# Patient Record
Sex: Female | Born: 1935
Health system: Southern US, Community
[De-identification: ages and names within clinical notes are randomized; demographics above are authoritative.]

## PROBLEM LIST (undated history)

## (undated) DIAGNOSIS — N183 Chronic kidney disease, stage 3 unspecified: Secondary | ICD-10-CM

## (undated) DIAGNOSIS — M1990 Unspecified osteoarthritis, unspecified site: Secondary | ICD-10-CM

## (undated) DIAGNOSIS — I5032 Chronic diastolic (congestive) heart failure: Secondary | ICD-10-CM

## (undated) DIAGNOSIS — G4730 Sleep apnea, unspecified: Secondary | ICD-10-CM

## (undated) DIAGNOSIS — G510 Bell's palsy: Secondary | ICD-10-CM

## (undated) DIAGNOSIS — E119 Type 2 diabetes mellitus without complications: Secondary | ICD-10-CM

## (undated) DIAGNOSIS — Z9889 Other specified postprocedural states: Secondary | ICD-10-CM

## (undated) DIAGNOSIS — I119 Hypertensive heart disease without heart failure: Secondary | ICD-10-CM

## (undated) DIAGNOSIS — F329 Major depressive disorder, single episode, unspecified: Secondary | ICD-10-CM

## (undated) DIAGNOSIS — M069 Rheumatoid arthritis, unspecified: Secondary | ICD-10-CM

## (undated) DIAGNOSIS — F32A Depression, unspecified: Secondary | ICD-10-CM

## (undated) DIAGNOSIS — E785 Hyperlipidemia, unspecified: Secondary | ICD-10-CM

## (undated) DIAGNOSIS — R0600 Dyspnea, unspecified: Secondary | ICD-10-CM

## (undated) HISTORY — PX: JOINT REPLACEMENT: SHX530

## (undated) HISTORY — DX: Chronic kidney disease, stage 3 (moderate): N18.3

## (undated) HISTORY — DX: Hypertensive heart disease without heart failure: I11.9

## (undated) HISTORY — PX: FRACTURE SURGERY: SHX138

## (undated) HISTORY — DX: Chronic kidney disease, stage 3 unspecified: N18.30

## (undated) HISTORY — PX: TOTAL HIP ARTHROPLASTY: SHX124

## (undated) HISTORY — PX: COLONOSCOPY: SHX174

## (undated) HISTORY — PX: CATARACT EXTRACTION W/ INTRAOCULAR LENS  IMPLANT, BILATERAL: SHX1307

## (undated) HISTORY — PX: KNEE ARTHROPLASTY: SHX992

## (undated) HISTORY — DX: Morbid (severe) obesity due to excess calories: E66.01

---

## 1964-04-14 HISTORY — PX: APPENDECTOMY: SHX54

## 1964-04-14 HISTORY — PX: CHOLECYSTECTOMY OPEN: SUR202

## 2013-05-15 HISTORY — PX: CARDIAC CATHETERIZATION: SHX172

## 2014-04-18 DIAGNOSIS — E119 Type 2 diabetes mellitus without complications: Secondary | ICD-10-CM | POA: Diagnosis not present

## 2014-04-18 DIAGNOSIS — I509 Heart failure, unspecified: Secondary | ICD-10-CM | POA: Diagnosis not present

## 2014-04-18 DIAGNOSIS — G4730 Sleep apnea, unspecified: Secondary | ICD-10-CM | POA: Diagnosis not present

## 2014-04-18 DIAGNOSIS — F39 Unspecified mood [affective] disorder: Secondary | ICD-10-CM | POA: Diagnosis not present

## 2014-04-18 DIAGNOSIS — I10 Essential (primary) hypertension: Secondary | ICD-10-CM | POA: Diagnosis not present

## 2014-04-18 DIAGNOSIS — Z0001 Encounter for general adult medical examination with abnormal findings: Secondary | ICD-10-CM | POA: Diagnosis not present

## 2014-04-18 DIAGNOSIS — M549 Dorsalgia, unspecified: Secondary | ICD-10-CM | POA: Diagnosis not present

## 2014-05-15 DIAGNOSIS — I10 Essential (primary) hypertension: Secondary | ICD-10-CM | POA: Diagnosis not present

## 2014-05-15 DIAGNOSIS — I509 Heart failure, unspecified: Secondary | ICD-10-CM | POA: Diagnosis not present

## 2014-05-16 DIAGNOSIS — M5441 Lumbago with sciatica, right side: Secondary | ICD-10-CM | POA: Diagnosis not present

## 2014-05-18 DIAGNOSIS — I10 Essential (primary) hypertension: Secondary | ICD-10-CM | POA: Diagnosis not present

## 2014-05-18 DIAGNOSIS — E780 Pure hypercholesterolemia: Secondary | ICD-10-CM | POA: Diagnosis not present

## 2014-05-18 DIAGNOSIS — E1129 Type 2 diabetes mellitus with other diabetic kidney complication: Secondary | ICD-10-CM | POA: Diagnosis not present

## 2014-05-18 DIAGNOSIS — E1165 Type 2 diabetes mellitus with hyperglycemia: Secondary | ICD-10-CM | POA: Diagnosis not present

## 2014-05-18 DIAGNOSIS — M5441 Lumbago with sciatica, right side: Secondary | ICD-10-CM | POA: Diagnosis not present

## 2014-05-18 DIAGNOSIS — N183 Chronic kidney disease, stage 3 (moderate): Secondary | ICD-10-CM | POA: Diagnosis not present

## 2014-05-18 DIAGNOSIS — Z7983 Long term (current) use of bisphosphonates: Secondary | ICD-10-CM | POA: Diagnosis not present

## 2014-05-18 DIAGNOSIS — E042 Nontoxic multinodular goiter: Secondary | ICD-10-CM | POA: Diagnosis not present

## 2014-05-22 DIAGNOSIS — M5441 Lumbago with sciatica, right side: Secondary | ICD-10-CM | POA: Diagnosis not present

## 2014-05-24 DIAGNOSIS — M5441 Lumbago with sciatica, right side: Secondary | ICD-10-CM | POA: Diagnosis not present

## 2014-05-25 DIAGNOSIS — I10 Essential (primary) hypertension: Secondary | ICD-10-CM | POA: Diagnosis not present

## 2014-05-25 DIAGNOSIS — E1121 Type 2 diabetes mellitus with diabetic nephropathy: Secondary | ICD-10-CM | POA: Diagnosis not present

## 2014-05-25 DIAGNOSIS — Z6841 Body Mass Index (BMI) 40.0 and over, adult: Secondary | ICD-10-CM | POA: Diagnosis not present

## 2014-05-25 DIAGNOSIS — F39 Unspecified mood [affective] disorder: Secondary | ICD-10-CM | POA: Diagnosis not present

## 2014-05-25 DIAGNOSIS — E1122 Type 2 diabetes mellitus with diabetic chronic kidney disease: Secondary | ICD-10-CM | POA: Diagnosis not present

## 2014-05-25 DIAGNOSIS — G4730 Sleep apnea, unspecified: Secondary | ICD-10-CM | POA: Diagnosis not present

## 2014-05-26 DIAGNOSIS — M5441 Lumbago with sciatica, right side: Secondary | ICD-10-CM | POA: Diagnosis not present

## 2014-05-29 DIAGNOSIS — M5441 Lumbago with sciatica, right side: Secondary | ICD-10-CM | POA: Diagnosis not present

## 2014-06-12 DIAGNOSIS — M5441 Lumbago with sciatica, right side: Secondary | ICD-10-CM | POA: Diagnosis not present

## 2014-06-14 DIAGNOSIS — M5441 Lumbago with sciatica, right side: Secondary | ICD-10-CM | POA: Diagnosis not present

## 2014-06-16 DIAGNOSIS — Z6841 Body Mass Index (BMI) 40.0 and over, adult: Secondary | ICD-10-CM | POA: Diagnosis not present

## 2014-06-16 DIAGNOSIS — I10 Essential (primary) hypertension: Secondary | ICD-10-CM | POA: Diagnosis not present

## 2014-06-16 DIAGNOSIS — E119 Type 2 diabetes mellitus without complications: Secondary | ICD-10-CM | POA: Diagnosis not present

## 2014-06-16 DIAGNOSIS — F39 Unspecified mood [affective] disorder: Secondary | ICD-10-CM | POA: Diagnosis not present

## 2014-06-16 DIAGNOSIS — G4730 Sleep apnea, unspecified: Secondary | ICD-10-CM | POA: Diagnosis not present

## 2014-06-19 DIAGNOSIS — E780 Pure hypercholesterolemia: Secondary | ICD-10-CM | POA: Diagnosis not present

## 2014-06-19 DIAGNOSIS — I509 Heart failure, unspecified: Secondary | ICD-10-CM | POA: Diagnosis not present

## 2014-06-19 DIAGNOSIS — I10 Essential (primary) hypertension: Secondary | ICD-10-CM | POA: Diagnosis not present

## 2014-07-04 DIAGNOSIS — G4730 Sleep apnea, unspecified: Secondary | ICD-10-CM | POA: Diagnosis not present

## 2014-07-04 DIAGNOSIS — Z6841 Body Mass Index (BMI) 40.0 and over, adult: Secondary | ICD-10-CM | POA: Diagnosis not present

## 2014-07-04 DIAGNOSIS — N183 Chronic kidney disease, stage 3 (moderate): Secondary | ICD-10-CM | POA: Diagnosis not present

## 2014-07-04 DIAGNOSIS — E1122 Type 2 diabetes mellitus with diabetic chronic kidney disease: Secondary | ICD-10-CM | POA: Diagnosis not present

## 2014-07-04 DIAGNOSIS — I10 Essential (primary) hypertension: Secondary | ICD-10-CM | POA: Diagnosis not present

## 2014-07-04 DIAGNOSIS — E1121 Type 2 diabetes mellitus with diabetic nephropathy: Secondary | ICD-10-CM | POA: Diagnosis not present

## 2014-07-04 DIAGNOSIS — F39 Unspecified mood [affective] disorder: Secondary | ICD-10-CM | POA: Diagnosis not present

## 2014-07-21 DIAGNOSIS — S63501A Unspecified sprain of right wrist, initial encounter: Secondary | ICD-10-CM | POA: Diagnosis not present

## 2014-07-21 DIAGNOSIS — S52501A Unspecified fracture of the lower end of right radius, initial encounter for closed fracture: Secondary | ICD-10-CM | POA: Diagnosis not present

## 2014-07-21 DIAGNOSIS — S4991XA Unspecified injury of right shoulder and upper arm, initial encounter: Secondary | ICD-10-CM | POA: Diagnosis not present

## 2014-07-21 DIAGNOSIS — E118 Type 2 diabetes mellitus with unspecified complications: Secondary | ICD-10-CM | POA: Diagnosis not present

## 2014-07-21 DIAGNOSIS — S52601A Unspecified fracture of lower end of right ulna, initial encounter for closed fracture: Secondary | ICD-10-CM | POA: Diagnosis not present

## 2014-07-25 DIAGNOSIS — I10 Essential (primary) hypertension: Secondary | ICD-10-CM | POA: Diagnosis not present

## 2014-07-25 DIAGNOSIS — E1122 Type 2 diabetes mellitus with diabetic chronic kidney disease: Secondary | ICD-10-CM | POA: Diagnosis not present

## 2014-07-25 DIAGNOSIS — S52501A Unspecified fracture of the lower end of right radius, initial encounter for closed fracture: Secondary | ICD-10-CM | POA: Diagnosis not present

## 2014-07-25 DIAGNOSIS — Z6841 Body Mass Index (BMI) 40.0 and over, adult: Secondary | ICD-10-CM | POA: Diagnosis not present

## 2014-07-25 DIAGNOSIS — F39 Unspecified mood [affective] disorder: Secondary | ICD-10-CM | POA: Diagnosis not present

## 2014-07-25 DIAGNOSIS — E1121 Type 2 diabetes mellitus with diabetic nephropathy: Secondary | ICD-10-CM | POA: Diagnosis not present

## 2014-07-25 DIAGNOSIS — R269 Unspecified abnormalities of gait and mobility: Secondary | ICD-10-CM | POA: Diagnosis not present

## 2014-07-27 DIAGNOSIS — E119 Type 2 diabetes mellitus without complications: Secondary | ICD-10-CM | POA: Diagnosis not present

## 2014-07-27 DIAGNOSIS — F329 Major depressive disorder, single episode, unspecified: Secondary | ICD-10-CM | POA: Diagnosis not present

## 2014-07-27 DIAGNOSIS — Z9181 History of falling: Secondary | ICD-10-CM | POA: Diagnosis not present

## 2014-07-27 DIAGNOSIS — S52571A Other intraarticular fracture of lower end of right radius, initial encounter for closed fracture: Secondary | ICD-10-CM | POA: Diagnosis not present

## 2014-07-27 DIAGNOSIS — Z6841 Body Mass Index (BMI) 40.0 and over, adult: Secondary | ICD-10-CM | POA: Diagnosis not present

## 2014-07-27 DIAGNOSIS — N183 Chronic kidney disease, stage 3 (moderate): Secondary | ICD-10-CM | POA: Diagnosis not present

## 2014-07-27 DIAGNOSIS — Z7982 Long term (current) use of aspirin: Secondary | ICD-10-CM | POA: Diagnosis not present

## 2014-07-27 DIAGNOSIS — S52501D Unspecified fracture of the lower end of right radius, subsequent encounter for closed fracture with routine healing: Secondary | ICD-10-CM | POA: Diagnosis not present

## 2014-07-27 DIAGNOSIS — I129 Hypertensive chronic kidney disease with stage 1 through stage 4 chronic kidney disease, or unspecified chronic kidney disease: Secondary | ICD-10-CM | POA: Diagnosis not present

## 2014-07-31 DIAGNOSIS — E119 Type 2 diabetes mellitus without complications: Secondary | ICD-10-CM | POA: Diagnosis not present

## 2014-07-31 DIAGNOSIS — F329 Major depressive disorder, single episode, unspecified: Secondary | ICD-10-CM | POA: Diagnosis not present

## 2014-07-31 DIAGNOSIS — I129 Hypertensive chronic kidney disease with stage 1 through stage 4 chronic kidney disease, or unspecified chronic kidney disease: Secondary | ICD-10-CM | POA: Diagnosis not present

## 2014-07-31 DIAGNOSIS — Z7982 Long term (current) use of aspirin: Secondary | ICD-10-CM | POA: Diagnosis not present

## 2014-07-31 DIAGNOSIS — S52501D Unspecified fracture of the lower end of right radius, subsequent encounter for closed fracture with routine healing: Secondary | ICD-10-CM | POA: Diagnosis not present

## 2014-07-31 DIAGNOSIS — N183 Chronic kidney disease, stage 3 (moderate): Secondary | ICD-10-CM | POA: Diagnosis not present

## 2014-08-01 DIAGNOSIS — E1121 Type 2 diabetes mellitus with diabetic nephropathy: Secondary | ICD-10-CM | POA: Diagnosis not present

## 2014-08-01 DIAGNOSIS — S52501A Unspecified fracture of the lower end of right radius, initial encounter for closed fracture: Secondary | ICD-10-CM | POA: Diagnosis not present

## 2014-08-01 DIAGNOSIS — E782 Mixed hyperlipidemia: Secondary | ICD-10-CM | POA: Diagnosis not present

## 2014-08-01 DIAGNOSIS — E1122 Type 2 diabetes mellitus with diabetic chronic kidney disease: Secondary | ICD-10-CM | POA: Diagnosis not present

## 2014-08-01 DIAGNOSIS — I10 Essential (primary) hypertension: Secondary | ICD-10-CM | POA: Diagnosis not present

## 2014-08-01 DIAGNOSIS — Z6839 Body mass index (BMI) 39.0-39.9, adult: Secondary | ICD-10-CM | POA: Diagnosis not present

## 2014-08-02 DIAGNOSIS — S52501D Unspecified fracture of the lower end of right radius, subsequent encounter for closed fracture with routine healing: Secondary | ICD-10-CM | POA: Diagnosis not present

## 2014-08-02 DIAGNOSIS — Z7982 Long term (current) use of aspirin: Secondary | ICD-10-CM | POA: Diagnosis not present

## 2014-08-02 DIAGNOSIS — F329 Major depressive disorder, single episode, unspecified: Secondary | ICD-10-CM | POA: Diagnosis not present

## 2014-08-02 DIAGNOSIS — S50311A Abrasion of right elbow, initial encounter: Secondary | ICD-10-CM | POA: Diagnosis not present

## 2014-08-02 DIAGNOSIS — I129 Hypertensive chronic kidney disease with stage 1 through stage 4 chronic kidney disease, or unspecified chronic kidney disease: Secondary | ICD-10-CM | POA: Diagnosis not present

## 2014-08-02 DIAGNOSIS — W19XXXA Unspecified fall, initial encounter: Secondary | ICD-10-CM | POA: Diagnosis not present

## 2014-08-02 DIAGNOSIS — Z794 Long term (current) use of insulin: Secondary | ICD-10-CM | POA: Diagnosis not present

## 2014-08-02 DIAGNOSIS — N183 Chronic kidney disease, stage 3 (moderate): Secondary | ICD-10-CM | POA: Diagnosis not present

## 2014-08-02 DIAGNOSIS — I10 Essential (primary) hypertension: Secondary | ICD-10-CM | POA: Diagnosis not present

## 2014-08-02 DIAGNOSIS — E119 Type 2 diabetes mellitus without complications: Secondary | ICD-10-CM | POA: Diagnosis not present

## 2014-08-07 ENCOUNTER — Encounter (HOSPITAL_COMMUNITY): Payer: Self-pay

## 2014-08-07 ENCOUNTER — Encounter (HOSPITAL_COMMUNITY)
Admission: RE | Admit: 2014-08-07 | Discharge: 2014-08-07 | Disposition: A | Discharge status: Home / Self Care | Payer: Medicare Other | Source: Ambulatory Visit | Attending: Orthopedic Surgery | Admitting: Orthopedic Surgery

## 2014-08-07 DIAGNOSIS — E119 Type 2 diabetes mellitus without complications: Secondary | ICD-10-CM | POA: Diagnosis not present

## 2014-08-07 DIAGNOSIS — I252 Old myocardial infarction: Secondary | ICD-10-CM | POA: Diagnosis not present

## 2014-08-07 DIAGNOSIS — E785 Hyperlipidemia, unspecified: Secondary | ICD-10-CM | POA: Diagnosis not present

## 2014-08-07 DIAGNOSIS — S6411XA Injury of median nerve at wrist and hand level of right arm, initial encounter: Secondary | ICD-10-CM | POA: Diagnosis not present

## 2014-08-07 DIAGNOSIS — R531 Weakness: Secondary | ICD-10-CM | POA: Diagnosis not present

## 2014-08-07 DIAGNOSIS — Z96643 Presence of artificial hip joint, bilateral: Secondary | ICD-10-CM | POA: Diagnosis not present

## 2014-08-07 DIAGNOSIS — W19XXXA Unspecified fall, initial encounter: Secondary | ICD-10-CM | POA: Diagnosis not present

## 2014-08-07 DIAGNOSIS — Z794 Long term (current) use of insulin: Secondary | ICD-10-CM | POA: Diagnosis not present

## 2014-08-07 DIAGNOSIS — S52571A Other intraarticular fracture of lower end of right radius, initial encounter for closed fracture: Secondary | ICD-10-CM | POA: Diagnosis not present

## 2014-08-07 DIAGNOSIS — I509 Heart failure, unspecified: Secondary | ICD-10-CM | POA: Diagnosis not present

## 2014-08-07 DIAGNOSIS — S62101A Fracture of unspecified carpal bone, right wrist, initial encounter for closed fracture: Secondary | ICD-10-CM | POA: Diagnosis not present

## 2014-08-07 DIAGNOSIS — R609 Edema, unspecified: Secondary | ICD-10-CM | POA: Diagnosis not present

## 2014-08-07 DIAGNOSIS — I10 Essential (primary) hypertension: Secondary | ICD-10-CM | POA: Diagnosis not present

## 2014-08-07 DIAGNOSIS — E780 Pure hypercholesterolemia: Secondary | ICD-10-CM | POA: Diagnosis not present

## 2014-08-07 HISTORY — DX: Sleep apnea, unspecified: G47.30

## 2014-08-07 HISTORY — DX: Major depressive disorder, single episode, unspecified: F32.9

## 2014-08-07 HISTORY — DX: Depression, unspecified: F32.A

## 2014-08-07 HISTORY — DX: Hyperlipidemia, unspecified: E78.5

## 2014-08-07 HISTORY — DX: Bell's palsy: G51.0

## 2014-08-07 LAB — BASIC METABOLIC PANEL
ANION GAP: 10 (ref 5–15)
BUN: 23 mg/dL (ref 6–23)
CALCIUM: 9.4 mg/dL (ref 8.4–10.5)
CO2: 24 mmol/L (ref 19–32)
Chloride: 106 mmol/L (ref 96–112)
Creatinine, Ser: 1.08 mg/dL (ref 0.50–1.10)
GFR, EST AFRICAN AMERICAN: 54 mL/min — AB (ref >90)
GFR, EST NON AFRICAN AMERICAN: 47 mL/min — AB (ref >90)
Glucose, Bld: 270 mg/dL — ABNORMAL HIGH (ref 70–99)
Potassium: 4.4 mmol/L (ref 3.5–5.1)
SODIUM: 140 mmol/L (ref 135–145)

## 2014-08-07 LAB — CBC
HCT: 41.2 % (ref 36.0–46.0)
Hemoglobin: 12.8 g/dL (ref 12.0–15.0)
MCH: 30.4 pg (ref 26.0–34.0)
MCHC: 31.1 g/dL (ref 30.0–36.0)
MCV: 97.9 fL (ref 78.0–100.0)
Platelets: 184 10*3/uL (ref 150–400)
RBC: 4.21 MIL/uL (ref 3.87–5.11)
RDW: 14.5 % (ref 11.5–15.5)
WBC: 7.6 10*3/uL (ref 4.0–10.5)

## 2014-08-07 NOTE — Pre-Procedure Instructions (Signed)
[name]  08/07/2014   Your procedure is scheduled on:  Tuesday, April 26.  Report to [facility] Admitting at 3:00 PM.  Call this number if you have problems the morning of surgery: [phone]   Remember:   Do not eat food or drink liquids after midnight.   Take these medicines the morning of surgery with A SIP OF WATER: amLODipine (NORVASC).                     Make take Tylenol if needed.              May take Tramadol if needed.   Do not wear jewelry, make-up or nail polish.  Do not wear lotions, powders, or perfumes.   Do not shave 48 hours prior to surgery.   Do not bring valuables to the hospital.              [facility] is not responsible for any belongings or valuables.               Contacts, dentures or bridgework may not be worn into surgery.  Leave suitcase in the car. After surgery it may be brought to your room.  For patients admitted to the hospital, discharge time is determined by your  treatment team.               Patients discharged the day of surgery will not be allowed to drive home.  Name and phone number of your driver: -   Special Instructions: Review  [facility] - Preparing For Surgery.   Please read over the following fact sheets that you were given: Pain Booklet, Coughing and Deep Breathing and Surgical Site Infection Prevention

## 2014-08-07 NOTE — Progress Notes (Signed)
Brandi Melton is from [facility], she is currently staying with her daughter in [place].    PCP is Dr Orland Dec- [facility] in [place]- I requested office notes, x-rays, labs, sleep study, Medication list.  Patient has sleep apnea but does not use CPAP.  Endocrinologist is Dr Dr Pennelope Bracken, [place].  Patient reported last hgbAIC was in the 7's.  This am fasting CBG was 170, which patient reported is a good reading for her.I requested reports and office notes from office.  Cardiologist is Dr Latricia Heft in [place], [place].  Patient reported that she has had an EKG in the past year, cardiac cath in the past 3 years- "was okay" I requested reports and office notes from office.

## 2014-08-08 ENCOUNTER — Inpatient Hospital Stay (HOSPITAL_COMMUNITY): Payer: Medicare Other | Admitting: Anesthesiology

## 2014-08-08 ENCOUNTER — Encounter (HOSPITAL_COMMUNITY): Payer: Self-pay | Admitting: *Deleted

## 2014-08-08 ENCOUNTER — Encounter (HOSPITAL_COMMUNITY)
Admission: RE | Disposition: A | Discharge status: Skilled Nursing Facility | Payer: Self-pay | Source: Ambulatory Visit | Attending: Orthopedic Surgery

## 2014-08-08 ENCOUNTER — Observation Stay (HOSPITAL_COMMUNITY)
Admission: RE | Admit: 2014-08-08 | Discharge: 2014-08-10 | Disposition: A | Discharge status: Skilled Nursing Facility | Payer: Medicare Other | Source: Ambulatory Visit | Attending: Orthopedic Surgery | Admitting: Orthopedic Surgery

## 2014-08-08 DIAGNOSIS — E780 Pure hypercholesterolemia: Secondary | ICD-10-CM | POA: Insufficient documentation

## 2014-08-08 DIAGNOSIS — G8918 Other acute postprocedural pain: Secondary | ICD-10-CM | POA: Diagnosis not present

## 2014-08-08 DIAGNOSIS — S6411XA Injury of median nerve at wrist and hand level of right arm, initial encounter: Secondary | ICD-10-CM | POA: Insufficient documentation

## 2014-08-08 DIAGNOSIS — Z794 Long term (current) use of insulin: Secondary | ICD-10-CM | POA: Diagnosis not present

## 2014-08-08 DIAGNOSIS — I509 Heart failure, unspecified: Secondary | ICD-10-CM | POA: Diagnosis not present

## 2014-08-08 DIAGNOSIS — W19XXXA Unspecified fall, initial encounter: Secondary | ICD-10-CM | POA: Insufficient documentation

## 2014-08-08 DIAGNOSIS — I252 Old myocardial infarction: Secondary | ICD-10-CM | POA: Insufficient documentation

## 2014-08-08 DIAGNOSIS — Z96643 Presence of artificial hip joint, bilateral: Secondary | ICD-10-CM | POA: Insufficient documentation

## 2014-08-08 DIAGNOSIS — E119 Type 2 diabetes mellitus without complications: Secondary | ICD-10-CM | POA: Insufficient documentation

## 2014-08-08 DIAGNOSIS — I10 Essential (primary) hypertension: Secondary | ICD-10-CM | POA: Diagnosis not present

## 2014-08-08 DIAGNOSIS — G5601 Carpal tunnel syndrome, right upper limb: Secondary | ICD-10-CM | POA: Diagnosis not present

## 2014-08-08 DIAGNOSIS — S52571A Other intraarticular fracture of lower end of right radius, initial encounter for closed fracture: Principal | ICD-10-CM | POA: Insufficient documentation

## 2014-08-08 DIAGNOSIS — S52509A Unspecified fracture of the lower end of unspecified radius, initial encounter for closed fracture: Secondary | ICD-10-CM | POA: Diagnosis present

## 2014-08-08 DIAGNOSIS — R531 Weakness: Secondary | ICD-10-CM | POA: Insufficient documentation

## 2014-08-08 DIAGNOSIS — S52501P Unspecified fracture of the lower end of right radius, subsequent encounter for closed fracture with malunion: Secondary | ICD-10-CM | POA: Diagnosis not present

## 2014-08-08 DIAGNOSIS — E785 Hyperlipidemia, unspecified: Secondary | ICD-10-CM | POA: Insufficient documentation

## 2014-08-08 DIAGNOSIS — R609 Edema, unspecified: Secondary | ICD-10-CM | POA: Insufficient documentation

## 2014-08-08 HISTORY — PX: CARPAL TUNNEL RELEASE: SHX101

## 2014-08-08 HISTORY — DX: Rheumatoid arthritis, unspecified: M06.9

## 2014-08-08 HISTORY — PX: ORIF WRIST FRACTURE: SHX2133

## 2014-08-08 LAB — GLUCOSE, CAPILLARY
GLUCOSE-CAPILLARY: 147 mg/dL — AB (ref 70–99)
GLUCOSE-CAPILLARY: 119 mg/dL — AB (ref 70–99)

## 2014-08-08 SURGERY — OPEN REDUCTION INTERNAL FIXATION (ORIF) WRIST FRACTURE
Anesthesia: General | Site: Wrist | Laterality: Right

## 2014-08-08 MED ORDER — FENTANYL CITRATE (PF) 100 MCG/2ML IJ SOLN
INTRAMUSCULAR | Status: AC
  Administered 2014-08-08: 25 ug via INTRAVENOUS
  Filled 2014-08-08: qty 2

## 2014-08-08 MED ORDER — ONDANSETRON HCL 4 MG/2ML IJ SOLN: 4.0000 mg | Freq: Four times a day (QID) | INTRAMUSCULAR | Status: DC | PRN

## 2014-08-08 MED ORDER — ONDANSETRON HCL 4 MG/2ML IJ SOLN
INTRAMUSCULAR | Status: DC | PRN
  Administered 2014-08-08: 4 mg via INTRAVENOUS

## 2014-08-08 MED ORDER — MAGNESIUM CITRATE PO SOLN
1.0000 | Freq: Once | ORAL | Status: AC | PRN
  Filled 2014-08-08: qty 296

## 2014-08-08 MED ORDER — 0.9 % SODIUM CHLORIDE (POUR BTL) OPTIME
TOPICAL | Status: DC | PRN
  Administered 2014-08-08 (×2): 1000 mL

## 2014-08-08 MED ORDER — CEFAZOLIN SODIUM 1-5 GM-% IV SOLN
1.0000 g | INTRAVENOUS | Status: AC
  Administered 2014-08-08: 1 g via INTRAVENOUS
  Filled 2014-08-08: qty 50

## 2014-08-08 MED ORDER — MORPHINE SULFATE 2 MG/ML IJ SOLN
1.0000 mg | INTRAMUSCULAR | Status: DC | PRN
  Administered 2014-08-09: 1 mg via INTRAVENOUS
  Filled 2014-08-08: qty 1

## 2014-08-08 MED ORDER — FENTANYL CITRATE (PF) 100 MCG/2ML IJ SOLN
INTRAMUSCULAR | Status: DC | PRN
  Administered 2014-08-08 (×5): 25 ug via INTRAVENOUS
  Administered 2014-08-08: 50 ug via INTRAVENOUS
  Administered 2014-08-08: 25 ug via INTRAVENOUS

## 2014-08-08 MED ORDER — PROPOFOL 10 MG/ML IV BOLUS: INTRAVENOUS | Status: DC | PRN

## 2014-08-08 MED ORDER — AMLODIPINE BESYLATE 2.5 MG PO TABS
2.5000 mg | ORAL_TABLET | Freq: Every day | ORAL | Status: DC
  Administered 2014-08-09 – 2014-08-10 (×2): 2.5 mg via ORAL
  Filled 2014-08-08 (×2): qty 1

## 2014-08-08 MED ORDER — ROPIVACAINE HCL 5 MG/ML IJ SOLN
INTRAMUSCULAR | Status: DC | PRN
  Administered 2014-08-08: 25 mL via PERINEURAL

## 2014-08-08 MED ORDER — OXYCODONE HCL 5 MG PO TABS
5.0000 mg | ORAL_TABLET | ORAL | Status: DC | PRN
  Administered 2014-08-09 – 2014-08-10 (×5): 10 mg via ORAL
  Filled 2014-08-08 (×5): qty 2

## 2014-08-08 MED ORDER — MIDAZOLAM HCL 2 MG/2ML IJ SOLN
INTRAMUSCULAR | Status: AC
  Filled 2014-08-08: qty 2

## 2014-08-08 MED ORDER — FOLIC ACID 0.5 MG HALF TAB
500.0000 ug | ORAL_TABLET | Freq: Every day | ORAL | Status: DC
  Administered 2014-08-09 – 2014-08-10 (×2): 0.5 mg via ORAL
  Filled 2014-08-08 (×2): qty 1

## 2014-08-08 MED ORDER — IRBESARTAN 150 MG PO TABS
150.0000 mg | ORAL_TABLET | Freq: Every day | ORAL | Status: DC
  Administered 2014-08-09 – 2014-08-10 (×2): 150 mg via ORAL
  Filled 2014-08-08 (×2): qty 1

## 2014-08-08 MED ORDER — INSULIN ASPART 100 UNIT/ML ~~LOC~~ SOLN
0.0000 [IU] | Freq: Three times a day (TID) | SUBCUTANEOUS | Status: DC
  Administered 2014-08-09: 8 [IU] via SUBCUTANEOUS
  Administered 2014-08-09: 5 [IU] via SUBCUTANEOUS
  Administered 2014-08-09: 3 [IU] via SUBCUTANEOUS
  Administered 2014-08-10: 11 [IU] via SUBCUTANEOUS
  Administered 2014-08-10: 5 [IU] via SUBCUTANEOUS

## 2014-08-08 MED ORDER — POTASSIUM CHLORIDE CRYS ER 20 MEQ PO TBCR
20.0000 meq | EXTENDED_RELEASE_TABLET | Freq: Every day | ORAL | Status: DC
  Administered 2014-08-09 – 2014-08-10 (×2): 20 meq via ORAL
  Filled 2014-08-08 (×2): qty 1

## 2014-08-08 MED ORDER — PROPOFOL 10 MG/ML IV BOLUS
INTRAVENOUS | Status: AC
  Filled 2014-08-08: qty 20

## 2014-08-08 MED ORDER — CEFAZOLIN SODIUM-DEXTROSE 2-3 GM-% IV SOLR
INTRAVENOUS | Status: AC
  Filled 2014-08-08: qty 50

## 2014-08-08 MED ORDER — FENTANYL CITRATE (PF) 250 MCG/5ML IJ SOLN
INTRAMUSCULAR | Status: AC
  Filled 2014-08-08: qty 5

## 2014-08-08 MED ORDER — SENNA 8.6 MG PO TABS
1.0000 | ORAL_TABLET | Freq: Two times a day (BID) | ORAL | Status: DC
  Administered 2014-08-08 – 2014-08-10 (×4): 8.6 mg via ORAL
  Filled 2014-08-08 (×4): qty 1

## 2014-08-08 MED ORDER — CARVEDILOL PHOSPHATE ER 80 MG PO CP24
80.0000 mg | ORAL_CAPSULE | Freq: Every day | ORAL | Status: DC
  Administered 2014-08-09 – 2014-08-10 (×2): 80 mg via ORAL
  Filled 2014-08-08 (×2): qty 1

## 2014-08-08 MED ORDER — CEFAZOLIN SODIUM-DEXTROSE 2-3 GM-% IV SOLR
2.0000 g | Freq: Once | INTRAVENOUS | Status: AC
  Administered 2014-08-08: 2 g via INTRAVENOUS

## 2014-08-08 MED ORDER — FENTANYL CITRATE (PF) 100 MCG/2ML IJ SOLN
INTRAMUSCULAR | Status: AC
Start: 2014-08-08 — End: 2014-08-09
  Filled 2014-08-08: qty 2

## 2014-08-08 MED ORDER — KETOROLAC TROMETHAMINE 30 MG/ML IJ SOLN
30.0000 mg | Freq: Once | INTRAMUSCULAR | Status: AC | PRN
  Administered 2014-08-08: 30 mg via INTRAVENOUS

## 2014-08-08 MED ORDER — EZETIMIBE-SIMVASTATIN 10-40 MG PO TABS
1.0000 | ORAL_TABLET | Freq: Every day | ORAL | Status: DC
  Administered 2014-08-08: 1 via ORAL
  Filled 2014-08-08 (×2): qty 1

## 2014-08-08 MED ORDER — POTASSIUM GLUCONATE 595 (99 K) MG PO TABS: 595.0000 mg | ORAL_TABLET | Freq: Every day | ORAL | Status: DC

## 2014-08-08 MED ORDER — ONDANSETRON HCL 4 MG PO TABS: 4.0000 mg | ORAL_TABLET | Freq: Four times a day (QID) | ORAL | Status: DC | PRN

## 2014-08-08 MED ORDER — KETOROLAC TROMETHAMINE 30 MG/ML IJ SOLN
INTRAMUSCULAR | Status: AC
  Filled 2014-08-08: qty 1

## 2014-08-08 MED ORDER — NIACIN ER (ANTIHYPERLIPIDEMIC) 500 MG PO TBCR
500.0000 mg | EXTENDED_RELEASE_TABLET | Freq: Two times a day (BID) | ORAL | Status: DC
  Administered 2014-08-08 – 2014-08-10 (×4): 500 mg via ORAL
  Filled 2014-08-08 (×5): qty 1

## 2014-08-08 MED ORDER — VITAMIN D (ERGOCALCIFEROL) 1.25 MG (50000 UNIT) PO CAPS: 50000.0000 [IU] | ORAL_CAPSULE | ORAL | Status: DC

## 2014-08-08 MED ORDER — CEFAZOLIN SODIUM 1-5 GM-% IV SOLN
1.0000 g | Freq: Three times a day (TID) | INTRAVENOUS | Status: AC
Start: 2014-08-09 — End: 2014-08-09
  Administered 2014-08-09 (×2): 1 g via INTRAVENOUS
  Filled 2014-08-08 (×3): qty 50

## 2014-08-08 MED ORDER — FENTANYL CITRATE (PF) 100 MCG/2ML IJ SOLN
25.0000 ug | INTRAMUSCULAR | Status: DC | PRN
  Administered 2014-08-08: 25 ug via INTRAVENOUS

## 2014-08-08 MED ORDER — PROMETHAZINE HCL 25 MG/ML IJ SOLN: 6.2500 mg | INTRAMUSCULAR | Status: DC | PRN

## 2014-08-08 MED ORDER — FUROSEMIDE 40 MG PO TABS
40.0000 mg | ORAL_TABLET | Freq: Every day | ORAL | Status: DC
  Administered 2014-08-09 – 2014-08-10 (×2): 40 mg via ORAL
  Filled 2014-08-08 (×2): qty 1

## 2014-08-08 MED ORDER — LIDOCAINE HCL (CARDIAC) 20 MG/ML IV SOLN
INTRAVENOUS | Status: DC | PRN
  Administered 2014-08-08: 60 mg via INTRAVENOUS

## 2014-08-08 MED ORDER — LACTATED RINGERS IV SOLN
INTRAVENOUS | Status: DC
  Administered 2014-08-08 (×3): via INTRAVENOUS

## 2014-08-08 MED ORDER — MAGNESIUM OXIDE 400 (241.3 MG) MG PO TABS
400.0000 mg | ORAL_TABLET | Freq: Every day | ORAL | Status: DC
  Administered 2014-08-09 – 2014-08-10 (×2): 400 mg via ORAL
  Filled 2014-08-08 (×2): qty 1

## 2014-08-08 MED ORDER — PROPOFOL 10 MG/ML IV BOLUS
INTRAVENOUS | Status: DC | PRN
  Administered 2014-08-08: 150 mg via INTRAVENOUS

## 2014-08-08 MED ORDER — VITAMIN D 1000 UNITS PO TABS
3000.0000 [IU] | ORAL_TABLET | Freq: Every day | ORAL | Status: DC
  Administered 2014-08-09 – 2014-08-10 (×2): 3000 [IU] via ORAL
  Filled 2014-08-08 (×2): qty 3

## 2014-08-08 MED ORDER — CEFAZOLIN SODIUM 1-5 GM-% IV SOLN
INTRAVENOUS | Status: AC
  Filled 2014-08-08: qty 50

## 2014-08-08 MED ORDER — LIDOCAINE HCL (CARDIAC) 20 MG/ML IV SOLN: INTRAVENOUS | Status: DC | PRN

## 2014-08-08 MED ORDER — MAGNESIUM OXIDE 400 MG PO TABS: 400.0000 mg | ORAL_TABLET | Freq: Every day | ORAL | Status: DC

## 2014-08-08 MED ORDER — LACTATED RINGERS IV SOLN
INTRAVENOUS | Status: DC
  Administered 2014-08-08 – 2014-08-09 (×2): via INTRAVENOUS

## 2014-08-08 SURGICAL SUPPLY — 82 items
BANDAGE ELASTIC 3 VELCRO ST LF (GAUZE/BANDAGES/DRESSINGS) ×2 IMPLANT
BANDAGE ELASTIC 4 VELCRO ST LF (GAUZE/BANDAGES/DRESSINGS) ×2 IMPLANT
BIT DRILL 2.2 SS TIBIAL (BIT) ×2 IMPLANT
BLADE SURG 15 STRL LF DISP TIS (BLADE) ×2 IMPLANT
BLADE SURG 15 STRL SS (BLADE) ×2
BLADE SURG ROTATE 9660 (MISCELLANEOUS) IMPLANT
BNDG CONFORM 2 STRL LF (GAUZE/BANDAGES/DRESSINGS) ×2 IMPLANT
BNDG CONFORM 3 STRL LF (GAUZE/BANDAGES/DRESSINGS) ×2 IMPLANT
BNDG ESMARK 4X9 LF (GAUZE/BANDAGES/DRESSINGS) ×2 IMPLANT
BNDG GAUZE ELAST 4 BULKY (GAUZE/BANDAGES/DRESSINGS) ×2 IMPLANT
CORDS BIPOLAR (ELECTRODE) ×2 IMPLANT
COVER SURGICAL LIGHT HANDLE (MISCELLANEOUS) ×2 IMPLANT
CUFF TOURNIQUET SINGLE 18IN (TOURNIQUET CUFF) IMPLANT
CUFF TOURNIQUET SINGLE 24IN (TOURNIQUET CUFF) ×2 IMPLANT
DRAIN TLS ROUND 10FR (DRAIN) IMPLANT
DRAPE OEC MINIVIEW 54X84 (DRAPES) ×2 IMPLANT
DRAPE SURG 17X23 STRL (DRAPES) ×2 IMPLANT
DRSG ADAPTIC 3X8 NADH LF (GAUZE/BANDAGES/DRESSINGS) ×2 IMPLANT
EVACUATOR 1/8 PVC DRAIN (DRAIN) IMPLANT
GAUZE SPONGE 4X4 12PLY STRL (GAUZE/BANDAGES/DRESSINGS) ×2 IMPLANT
GAUZE XEROFORM 1X8 LF (GAUZE/BANDAGES/DRESSINGS) ×2 IMPLANT
GAUZE XEROFORM 5X9 LF (GAUZE/BANDAGES/DRESSINGS) ×2 IMPLANT
GLOVE BIO SURGEON STRL SZ7 (GLOVE) ×6 IMPLANT
GLOVE BIOGEL M STRL SZ7.5 (GLOVE) ×2 IMPLANT
GLOVE BIOGEL PI IND STRL 6.5 (GLOVE) ×3 IMPLANT
GLOVE BIOGEL PI INDICATOR 6.5 (GLOVE) ×3
GLOVE SS BIOGEL STRL SZ 8 (GLOVE) ×1 IMPLANT
GLOVE SUPERSENSE BIOGEL SZ 8 (GLOVE) ×1
GOWN STRL REUS W/ TWL LRG LVL3 (GOWN DISPOSABLE) ×2 IMPLANT
GOWN STRL REUS W/ TWL XL LVL3 (GOWN DISPOSABLE) ×2 IMPLANT
GOWN STRL REUS W/TWL LRG LVL3 (GOWN DISPOSABLE) ×2
GOWN STRL REUS W/TWL XL LVL3 (GOWN DISPOSABLE) ×2
K-WIRE 1.6 (WIRE) ×1
K-WIRE FX5X1.6XNS BN SS (WIRE) ×1
KIT BASIN OR (CUSTOM PROCEDURE TRAY) ×2 IMPLANT
KIT ROOM TURNOVER OR (KITS) ×2 IMPLANT
KWIRE FX5X1.6XNS BN SS (WIRE) ×1 IMPLANT
LOOP VESSEL MAXI BLUE (MISCELLANEOUS) IMPLANT
MANIFOLD NEPTUNE II (INSTRUMENTS) ×2 IMPLANT
NEEDLE 22X1 1/2 (OR ONLY) (NEEDLE) IMPLANT
NEEDLE HYPO 25GX1X1/2 BEV (NEEDLE) IMPLANT
NS IRRIG 1000ML POUR BTL (IV SOLUTION) ×2 IMPLANT
PACK ORTHO EXTREMITY (CUSTOM PROCEDURE TRAY) ×2 IMPLANT
PAD ARMBOARD 7.5X6 YLW CONV (MISCELLANEOUS) ×4 IMPLANT
PAD CAST 3X4 CTTN HI CHSV (CAST SUPPLIES) ×1 IMPLANT
PAD CAST 4YDX4 CTTN HI CHSV (CAST SUPPLIES) ×2 IMPLANT
PADDING CAST COTTON 3X4 STRL (CAST SUPPLIES) ×1
PADDING CAST COTTON 4X4 STRL (CAST SUPPLIES) ×2
PEG LOCKING SMOOTH 2.2X18 (Peg) ×4 IMPLANT
PEG LOCKING SMOOTH 2.2X20 (Screw) ×4 IMPLANT
PEG LOCKING SMOOTH 2.2X22 (Screw) ×4 IMPLANT
PLATE MEDIUM DVR RIGHT (Plate) ×2 IMPLANT
PUTTY DBM STAGRAFT PLUS 5CC (Putty) ×2 IMPLANT
SCREW LOCK 12X2.7X 3 LD (Screw) ×1 IMPLANT
SCREW LOCK 14X2.7X 3 LD TPR (Screw) ×1 IMPLANT
SCREW LOCK 20X2.7X 3 LD TPR (Screw) ×1 IMPLANT
SCREW LOCKING 2.7X12MM (Screw) ×1 IMPLANT
SCREW LOCKING 2.7X13MM (Screw) ×8 IMPLANT
SCREW LOCKING 2.7X14 (Screw) ×1 IMPLANT
SCREW LOCKING 2.7X20MM (Screw) ×1 IMPLANT
SCREW MULTI DIRECTIONAL 2.7X18 (Screw) ×2 IMPLANT
SOLUTION BETADINE 4OZ (MISCELLANEOUS) ×2 IMPLANT
SPLINT FIBERGLASS 3X35 (CAST SUPPLIES) ×2 IMPLANT
SPLINT FIBERGLASS 4X30 (CAST SUPPLIES) ×2 IMPLANT
SPONGE GAUZE 4X4 12PLY STER LF (GAUZE/BANDAGES/DRESSINGS) ×2 IMPLANT
SPONGE LAP 4X18 X RAY DECT (DISPOSABLE) IMPLANT
SPONGE SCRUB IODOPHOR (GAUZE/BANDAGES/DRESSINGS) ×2 IMPLANT
SUCTION FRAZIER TIP 10 FR DISP (SUCTIONS) IMPLANT
SUT MNCRL AB 4-0 PS2 18 (SUTURE) ×2 IMPLANT
SUT PROLENE 3 0 PS 2 (SUTURE) IMPLANT
SUT PROLENE 4 0 PS 2 18 (SUTURE) ×4 IMPLANT
SUT VIC AB 2-0 CT1 27 (SUTURE)
SUT VIC AB 2-0 CT1 TAPERPNT 27 (SUTURE) IMPLANT
SUT VIC AB 3-0 FS2 27 (SUTURE) ×2 IMPLANT
SYR CONTROL 10ML LL (SYRINGE) IMPLANT
SYSTEM CHEST DRAIN TLS 7FR (DRAIN) ×2 IMPLANT
TOWEL OR 17X24 6PK STRL BLUE (TOWEL DISPOSABLE) ×2 IMPLANT
TOWEL OR 17X26 10 PK STRL BLUE (TOWEL DISPOSABLE) ×2 IMPLANT
TUBE CONNECTING 12X1/4 (SUCTIONS) ×2 IMPLANT
TUBE EVACUATION TLS (MISCELLANEOUS) ×2 IMPLANT
UNDERPAD 30X30 INCONTINENT (UNDERPADS AND DIAPERS) ×2 IMPLANT
WATER STERILE IRR 1000ML POUR (IV SOLUTION) ×2 IMPLANT

## 2014-08-08 NOTE — Transfer of Care (Signed)
Immediate Anesthesia Transfer of Care Note  Patient: Brandi Melton  Procedure(s) Performed: Procedure(s): Right OPEN REDUCTION INTERNAL FIXATION (ORIF) WRIST FRACTURE WITH REPAIR AND RECONST/ALLOGRAFT AND BONE GRAFT (Right) OPEN CARPAL TUNNEL RELEASE (Right)  Patient Location: PACU  Anesthesia Type:GA combined with regional for post-op pain  Level of Consciousness: awake, alert  and oriented  Airway & Oxygen Therapy: Patient Spontanous Breathing and Patient connected to nasal cannula oxygen  Post-op Assessment: Report given to RN and Post -op Vital signs reviewed and stable  Post vital signs: Reviewed and stable  Last Vitals:  Filed Vitals:   08/08/14 2015  BP: 157/64  Pulse: 85  Temp:   Resp: 24    Complications: No apparent anesthesia complications

## 2014-08-08 NOTE — H&P (Signed)
Brandi Melton is an 81 y.o. female.   Chief Complaint: Nascent malunion right radius fracture status post fall HPI: Patient is a female who presents with a nascent malunion of her right wrist. She fell in [place]. She has family in [place] desired caring to [place] area. Unfortunately this fracture is  3 weeks out significantly collapsed and associated with median nerve compression phenomenon.  Patient is alert and oriented. She is a history of unsteadiness in terms of her gait.  She has lived in a assisted type environment in [place].  She denies neck back chest or abdominal pain.  She is diabetic without significant comp occasions with her diabetic disease this juncture.  Past Medical History  Diagnosis Date  . Diabetes mellitus without complication   . Hypertension   . Hyperlipemia   . Depression   . Myocardial infarction     Maybe a small one- unaware when  . Shortness of breath dyspnea     with exertion  . CHF (congestive heart failure)   . Sleep apnea   . Palsy, Bell's     Past Surgical History  Procedure Laterality Date  . Joint replacement Bilateral     Hips  . Knee arthroplasty Left   . Cholecystectomy    . Cardiac catheterization  2013ish  . Colonoscopy      History reviewed. No pertinent family history. Social History:  reports that she has never smoked. She does not have any smokeless tobacco history on file. She reports that she does not drink alcohol or use illicit drugs.  Allergies: No Known Allergies  Medications Prior to Admission  Medication Sig Dispense Refill  . acetaminophen (TYLENOL) 500 MG tablet Take 500 mg by mouth every 6 (six) hours as needed (pain).    Marland Kitchen amLODipine (NORVASC) 5 MG tablet Take 2.5 mg by mouth daily.   3  . carvedilol (COREG CR) 80 MG 24 hr capsule Take 80 mg by mouth daily.    . cholecalciferol (VITAMIN D) 1000 UNITS tablet Take 3,000 Units by mouth daily.    . Dulaglutide 0.75 MG/0.5ML SOPN Inject 0.75 mg into the  skin once a week. On Saturdays (Trulicity)    . ezetimibe-simvastatin (VYTORIN) 10-40 MG per tablet Take 1 tablet by mouth at bedtime.    . folic acid (FOLVITE) Q000111Q MCG tablet Take 400 mcg by mouth daily.    . furosemide (LASIX) 40 MG tablet Take 40 mg by mouth daily.    . insulin aspart (NOVOLOG) 100 UNIT/ML FlexPen Inject 7-10 Units into the skin 3 (three) times daily with meals. Per sliding scale    . Insulin Detemir (LEVEMIR) 100 UNIT/ML Pen Inject into the skin daily at 10 pm. Dose varies based on CBG (FlexTouch Pen)    . linagliptin (TRADJENTA) 5 MG TABS tablet Take 5 mg by mouth daily. Tradjenta    . magnesium oxide (MAG-OX) 400 MG tablet Take 400 mg by mouth daily.    . metFORMIN (GLUCOPHAGE) 500 MG tablet Take 500 mg by mouth 2 (two) times daily.  11  . niacin (NIASPAN) 500 MG CR tablet Take 500 mg by mouth 2 (two) times daily.    . potassium chloride SA (K-DUR,KLOR-CON) 20 MEQ tablet Take 20 mEq by mouth daily.    . potassium gluconate 595 MG TABS tablet Take 595 mg by mouth daily.    Marland Kitchen telmisartan (MICARDIS) 40 MG tablet Take 40 mg by mouth daily. Micardis    . Vitamin D, Ergocalciferol, (DRISDOL) 50000  UNITS CAPS capsule Take 50,000 Units by mouth every 7 (seven) days. On Thursdays      Results for orders placed or performed during the hospital encounter of 08/08/14 (from the past 48 hour(s))  Glucose, capillary     Status: Abnormal   Collection Time: 08/08/14  3:35 PM  Result Value Ref Range   Glucose-Capillary 147 (H) 70 - 99 mg/dL   No results found.  Review of Systems  Constitutional: Negative.   HENT: Negative.   Respiratory: Negative.   Gastrointestinal: Negative.   Genitourinary: Negative.   Neurological: Negative.   Endo/Heme/Allergies: Negative.   Psychiatric/Behavioral: Negative.     Blood pressure 185/56, pulse 78, temperature 97.9 F (36.6 C), temperature source Oral, resp. rate 16, height 5\' 3"  (1.6 m), weight 101.56 kg (223 lb 14.4 oz), SpO2 96  %. Physical Exam  Patient is obese she has a deformed wrist and median nerve compression phenomenon about the right upper extremity. Elbow and shoulder nontender    Her neck is nontender.  Her left upper extremity is nontender and neurovascularly intact.  I reviewed her length and also would refer to my office notes from yesterday.  The patient is alert and oriented in no acute distress. The patient complains of pain in the affected upper extremity.  The patient is noted to have a normal HEENT exam. Lung fields show equal chest expansion and no shortness of breath. Abdomen exam is nontender without distention. Lower extremity examination does not show any fracture dislocation or blood clot symptoms. Pelvis is stable and the neck and back are stable and nontender. Assessment/Plan Nascent malunion right radius with associated median nerve compression.  We'll plan for open reduction internal fixation allograft bone graft is necessary and associated carpal tunnel release. I discussed her relevant issues do's and don'ts times and duration of recovery etc.  We are planning surgery for your upper extremity. The risk and benefits of surgery to include risk of bleeding, infection, anesthesia,  damage to normal structures and failure of the surgery to accomplish its intended goals of relieving symptoms and restoring function have been discussed in detail. With this in mind we plan to proceed. I have specifically discussed with the patient the pre-and postoperative regime and the dos and don'ts and risk and benefits in great detail. Risk and benefits of surgery also include risk of dystrophy(CRPS), chronic nerve pain, failure of the healing process to go onto completion and other inherent risks of surgery The relavent the pathophysiology of the disease/injury process, as well as the alternatives for treatment and postoperative course of action has been discussed in great detail with the patient who  desires to proceed.  We will do everything in our power to help you (the patient) restore function to the upper extremity. It is a pleasure to see this patient today.  Paulene Floor 08/08/2014, 5:40 PM

## 2014-08-08 NOTE — Care Management (Signed)
This patient is being followed by [facility]. I will follow for discharge needs.  Thank you. Christa See RN BSN [facility] [phone]

## 2014-08-08 NOTE — Anesthesia Procedure Notes (Addendum)
Anesthesia Regional Block:  Supraclavicular block  Pre-Anesthetic Checklist: ,, timeout performed, Correct Patient, Correct Site, Correct Laterality, Correct Procedure, Correct Position, site marked, Risks and benefits discussed,  Surgical consent,  Pre-op evaluation,  At surgeon's request and post-op pain management  Laterality: Right  Prep: chloraprep       Needles:  Injection technique: Single-shot  Needle Type: Echogenic Stimulator Needle     Needle Length: 9cm 9 cm Needle Gauge: 21 and 21 G    Additional Needles:  Procedures: ultrasound guided (picture in chart) and nerve stimulator Supraclavicular block Narrative:  Injection made incrementally with aspirations every 5 mL.  Performed by: Personally   Additional Notes: Risks, benefits and alternative to block explained extensively.  Patient tolerated procedure well, without complications.   Procedure Name: LMA Insertion Date/Time: 08/08/2014 5:57 PM Performed by: Clearnce Sorrel Pre-anesthesia Checklist: Patient identified, Emergency Drugs available, Suction available, Patient being monitored and Timeout performed Patient Re-evaluated:Patient Re-evaluated prior to inductionOxygen Delivery Method: Circle system utilized Preoxygenation: Pre-oxygenation with 100% oxygen Intubation Type: IV induction LMA: LMA inserted LMA Size: 4.0 Number of attempts: 1 Placement Confirmation: positive ETCO2 and breath sounds checked- equal and bilateral Tube secured with: Tape Dental Injury: Teeth and Oropharynx as per pre-operative assessment

## 2014-08-08 NOTE — Anesthesia Preprocedure Evaluation (Signed)
Anesthesia Evaluation  Patient identified by MRN, date of birth, ID band Patient awake    Reviewed: Allergy & Precautions, NPO status , Patient's Chart, lab work & pertinent test results  Airway Mallampati: II  TM Distance: >3 FB Neck ROM: Full    Dental no notable dental hx.    Pulmonary sleep apnea ,  breath sounds clear to auscultation  Pulmonary exam normal       Cardiovascular hypertension, Pt. on medications +CHF Rhythm:Regular Rate:Normal     Neuro/Psych negative neurological ROS  negative psych ROS   GI/Hepatic negative GI ROS, Neg liver ROS,   Endo/Other  diabetes, Insulin DependentMorbid obesity  Renal/GU negative Renal ROS  negative genitourinary   Musculoskeletal negative musculoskeletal ROS (+)   Abdominal   Peds negative pediatric ROS (+)  Hematology negative hematology ROS (+)   Anesthesia Other Findings   Reproductive/Obstetrics negative OB ROS                             Anesthesia Physical Anesthesia Plan  ASA: III  Anesthesia Plan: General   Post-op Pain Management:    Induction: Intravenous  Airway Management Planned: LMA  Additional Equipment:   Intra-op Plan:   Post-operative Plan: Extubation in OR  Informed Consent: I have reviewed the patients History and Physical, chart, labs and discussed the procedure including the risks, benefits and alternatives for the proposed anesthesia with the patient or authorized representative who has indicated his/her understanding and acceptance.   Dental advisory given  Plan Discussed with: CRNA and Surgeon  Anesthesia Plan Comments:         Anesthesia Quick Evaluation

## 2014-08-08 NOTE — Op Note (Signed)
See dictation [id number] Akesha Uresti MD

## 2014-08-08 NOTE — Anesthesia Postprocedure Evaluation (Signed)
  Anesthesia Post-op Note  Patient: Brandi Melton  Procedure(s) Performed: Procedure(s): Right OPEN REDUCTION INTERNAL FIXATION (ORIF) WRIST FRACTURE WITH REPAIR AND RECONST/ALLOGRAFT AND BONE GRAFT (Right) OPEN CARPAL TUNNEL RELEASE (Right)  Patient Location: PACU  Anesthesia Type: General   Level of Consciousness: awake, alert  and oriented  Airway and Oxygen Therapy: Patient Spontanous Breathing  Post-op Pain: mild  Post-op Assessment: Post-op Vital signs reviewed  Post-op Vital Signs: Reviewed  Last Vitals:  Filed Vitals:   08/08/14 2050  BP:   Pulse: 81  Temp:   Resp: 19    Complications: No apparent anesthesia complications

## 2014-08-09 ENCOUNTER — Encounter (HOSPITAL_COMMUNITY): Payer: Self-pay | Admitting: General Practice

## 2014-08-09 DIAGNOSIS — I509 Heart failure, unspecified: Secondary | ICD-10-CM | POA: Diagnosis not present

## 2014-08-09 DIAGNOSIS — Z794 Long term (current) use of insulin: Secondary | ICD-10-CM | POA: Diagnosis not present

## 2014-08-09 DIAGNOSIS — S52501A Unspecified fracture of the lower end of right radius, initial encounter for closed fracture: Secondary | ICD-10-CM | POA: Diagnosis not present

## 2014-08-09 DIAGNOSIS — E780 Pure hypercholesterolemia: Secondary | ICD-10-CM | POA: Diagnosis not present

## 2014-08-09 DIAGNOSIS — S52571A Other intraarticular fracture of lower end of right radius, initial encounter for closed fracture: Secondary | ICD-10-CM | POA: Diagnosis not present

## 2014-08-09 DIAGNOSIS — I10 Essential (primary) hypertension: Secondary | ICD-10-CM

## 2014-08-09 DIAGNOSIS — E119 Type 2 diabetes mellitus without complications: Secondary | ICD-10-CM | POA: Diagnosis not present

## 2014-08-09 LAB — BASIC METABOLIC PANEL
Anion gap: 8 (ref 5–15)
BUN: 15 mg/dL (ref 6–23)
CO2: 24 mmol/L (ref 19–32)
CREATININE: 0.89 mg/dL (ref 0.50–1.10)
Calcium: 8.3 mg/dL — ABNORMAL LOW (ref 8.4–10.5)
Chloride: 105 mmol/L (ref 96–112)
GFR calc Af Amer: 69 mL/min — ABNORMAL LOW (ref >90)
GFR calc non Af Amer: 59 mL/min — ABNORMAL LOW (ref >90)
Glucose, Bld: 162 mg/dL — ABNORMAL HIGH (ref 70–99)
Potassium: 4 mmol/L (ref 3.5–5.1)
Sodium: 137 mmol/L (ref 135–145)

## 2014-08-09 LAB — GLUCOSE, CAPILLARY
GLUCOSE-CAPILLARY: 151 mg/dL — AB (ref 70–99)
GLUCOSE-CAPILLARY: 282 mg/dL — AB (ref 70–99)
Glucose-Capillary: 156 mg/dL — ABNORMAL HIGH (ref 70–99)
Glucose-Capillary: 216 mg/dL — ABNORMAL HIGH (ref 70–99)
Glucose-Capillary: 240 mg/dL — ABNORMAL HIGH (ref 70–99)

## 2014-08-09 LAB — CBC
HEMATOCRIT: 38.4 % (ref 36.0–46.0)
Hemoglobin: 11.9 g/dL — ABNORMAL LOW (ref 12.0–15.0)
MCH: 30.4 pg (ref 26.0–34.0)
MCHC: 31 g/dL (ref 30.0–36.0)
MCV: 98.2 fL (ref 78.0–100.0)
Platelets: 175 10*3/uL (ref 150–400)
RBC: 3.91 MIL/uL (ref 3.87–5.11)
RDW: 14.5 % (ref 11.5–15.5)
WBC: 9.2 10*3/uL (ref 4.0–10.5)

## 2014-08-09 MED ORDER — EZETIMIBE 10 MG PO TABS
10.0000 mg | ORAL_TABLET | Freq: Every day | ORAL | Status: DC
  Administered 2014-08-09: 10 mg via ORAL
  Filled 2014-08-09 (×2): qty 1

## 2014-08-09 MED ORDER — ATORVASTATIN CALCIUM 20 MG PO TABS
20.0000 mg | ORAL_TABLET | Freq: Every day | ORAL | Status: DC
  Administered 2014-08-09: 20 mg via ORAL
  Filled 2014-08-09: qty 1

## 2014-08-09 NOTE — Progress Notes (Addendum)
Received patient from PACU.  AOx4.  Pain controlled at 2/10.  VS stable.  Paged [facility] admission.

## 2014-08-09 NOTE — Progress Notes (Signed)
Subjective: 1 Day Post-Op Procedure(s) (LRB): Right OPEN REDUCTION INTERNAL FIXATION (ORIF) WRIST FRACTURE WITH REPAIR AND RECONST/ALLOGRAFT AND BONE GRAFT (Right) OPEN CARPAL TUNNEL RELEASE (Right) Patient reports pain as tolerable. She states that the block began to wear off early this morning. She denies any nausea, vomiting, fever or chills at this juncture. SHe does have an appetite this morning and is looking forward breakfast.  Objective: Vital signs in last 24 hours: Temp:  [97.8 F (36.6 C)-98.4 F (36.9 C)] 97.8 F (36.6 C) (04/27 0604) Pulse Rate:  [70-85] 78 (04/27 0604) Resp:  [16-24] 16 (04/27 0604) BP: (149-185)/(42-64) 156/55 mmHg (04/27 0604) SpO2:  [91 %-100 %] 99 % (04/27 0604) Weight:  [101.152 kg (223 lb)-101.56 kg (223 lb 14.4 oz)] 101.152 kg (223 lb) (04/26 2135)  Intake/Output from previous day: 04/26 0701 - 04/27 0700 In: 1951.3 [P.O.:240; I.V.:1611.3; IV Piggyback:100] Out: 1107.5 [Urine:1100; Drains:7.5] Intake/Output this shift:     Recent Labs  08/07/14 1545 08/09/14 0505  HGB 12.8 11.9*    Recent Labs  08/07/14 1545 08/09/14 0505  WBC 7.6 9.2  RBC 4.21 3.91  HCT 41.2 38.4  PLT 184 175    Recent Labs  08/07/14 1545 08/09/14 0505  NA 140 137  K 4.4 4.0  CL 106 105  CO2 24 24  BUN 23 15  CREATININE 1.08 0.89  GLUCOSE 270* 162*  CALCIUM 9.4 8.3*   No results for input(s): LABPT, INR in the last 72 hours.  Physical examination SHe is very pleasant, conversant this morning. She is in no acute distress, alert and oriented 3 HEENT: Traumatic normocephalic Chest: Equal expansions are present respirations are nonlabored Abdomen: Nontender Right upper extremity: Splint is intact, sensation is intact about the digits radial, ulnar, median nerve distribution are intact. The drain is removed without difficulties. She does have moderate edema about the digits and dorsal aspect of the hand. She has initial pain with active range of motion  however with massage and passive range of motion she is able to actively make a fist by the end of the visit. No signs of infection are present.  Assessment/Plan: 1 Day Post-Op Procedure(s) (LRB): Right OPEN REDUCTION INTERNAL FIXATION (ORIF) WRIST FRACTURE WITH REPAIR AND RECONST/ALLOGRAFT AND BONE GRAFT (Right) OPEN CARPAL TUNNEL RELEASE (Right) I have discussed with the patient the need for diligent active range of motion of the digits, elevation above the level of the heart and frequent massage the fingers and dorsal aspect of the hand. Our goal will be to continue to treat her pain, work on ambulating the patient with therapy, once her gait is more steady she may potentially be able to use a platform walker bearing weight through the right forearm arm and elbow region, however no weightbearing to the hand or wrist. Case care management consult will be implemented today for discharge disposition purposes I have discussed all issues with her today it is a pleasure to see her. We certainly appreciate the hospitalist following this nice lady for her medical needs.  Viyaan Champine L 08/09/2014, 8:03 AM

## 2014-08-09 NOTE — Care Management Note (Signed)
  Page 1 of 1   08/09/2014     4:24:42 PM CARE MANAGEMENT NOTE 08/09/2014  Patient:  Brandi Melton, Brandi Melton   Account Number:  [id number]  Date Initiated:  08/09/2014  Documentation initiated by:  Magdalen Spatz  Subjective/Objective Assessment:     Action/Plan:   Anticipated DC Date:  08/10/2014   Anticipated DC Plan:  SKILLED NURSING FACILITY         Choice offered to / List presented to:             Status of service:   Medicare Important Message given?   (If response is "NO", the following Medicare IM given date fields will be blank) Date Medicare IM given:   Medicare IM given by:   Date Additional Medicare IM given:   Additional Medicare IM given by:    Discharge Disposition:    Per UR Regulation:  Reviewed for med. necessity/level of care/duration of stay  If discussed at Long Length of Stay Meetings, dates discussed:    Comments:  08-09-14 Met with patient , daughter and son in  law . Patient has 2 SNF offers ( private pay ) , SW provided patinet and family with cost. Daughter and her husband will go tour SNF's this evening . Son in law requested private duty list, same provided . Patient expecting to discharge tomorrow 08-10-14. Magdalen Spatz RN BSN   08-09-14 Consult for short term SNF , re entered for SW , however patient is ordered as observation , UR completed called to Dr Reynaldo Minium , MD advisor who agrees with observation , therefore patient does not qualify for Medicare 3 midnight inpatient stay . SNF will be private pay.  SW aware. Magdalen Spatz RN BSN [phone]

## 2014-08-09 NOTE — Consult Note (Signed)
[facility] History and Physical  Brandi Melton [id number] DOB: Jan 22, 1931 DOA: 08/08/2014  CONSULTATION  Requesting physician: Dr. Trenda Moots PCP: Patient is from [place], [place], and plans to return there  Reason for consultation: Post-operative medical management of multiple comorbidities  HPI: Brandi Melton is a 81 y.o. woman with a history of HTN, Insulin dependent diabetes, and CHF (type unknown), was in her baseline state of health until she fell approximately two weeks ago in [place].  She came to [facility] for surgery to be near one of her daughters until she recovers successfully.  She sustained a complex, comminuted fracture to her right wrist.  She was taken to the OR for ORIF on 08/08/2014.  Post-operatively she has done well.  Pain is controlled.  The patient denies any chest pain or shortness of breath (different from her baseline) in the past 30 days.  No limitations to her normal ADLs.  She ambulates with a walker at baseline.  No swelling.  No weight gain.  Stable appetite.  No apparent blood loss.  Bowel and bladder habits have been normal.   She is not on home oxygen.  She no longer uses CPAP.  Review of Systems: 10 systems reviewed and negative except as stated in HPI.  Past Medical History  Diagnosis Date  . Diabetes mellitus without complication   . Hypertension   . Hyperlipemia   . Depression   . Myocardial infarction     Maybe a small one- unaware when  . Shortness of breath dyspnea     with exertion  . CHF (congestive heart failure)   . Sleep apnea   . Palsy, Bell's    Past Surgical History  Procedure Laterality Date  . Joint replacement Bilateral     Hips  . Knee arthroplasty Left   . Cholecystectomy    . Cardiac catheterization  2013ish  . Colonoscopy     Social History:  She is a widow.  Lives in an assisted living facility in [place], [place].  No tobacco, EtOH, or illicit drug use.  She has two surviving children; one lives here in  [place].  No Known Allergies  History reviewed. No pertinent family history.  Heart disease is prevalent.  Prior to Admission medications   Medication Sig Start Date End Date Taking? Authorizing Provider  acetaminophen (TYLENOL) 500 MG tablet Take 500 mg by mouth every 6 (six) hours as needed (pain).   Yes Historical Provider, MD  amLODipine (NORVASC) 5 MG tablet Take 2.5 mg by mouth daily.  08/01/14  Yes Historical Provider, MD  carvedilol (COREG CR) 80 MG 24 hr capsule Take 80 mg by mouth daily.   Yes Historical Provider, MD  cholecalciferol (VITAMIN D) 1000 UNITS tablet Take 3,000 Units by mouth daily.   Yes Historical Provider, MD  Dulaglutide 0.75 MG/0.5ML SOPN Inject 0.75 mg into the skin once a week. On Saturdays (Trulicity)   Yes Historical Provider, MD  ezetimibe-simvastatin (VYTORIN) 10-40 MG per tablet Take 1 tablet by mouth at bedtime.   Yes Historical Provider, MD  folic acid (FOLVITE) [id number] MCG tablet Take 400 mcg by mouth daily.   Yes Historical Provider, MD  furosemide (LASIX) 40 MG tablet Take 40 mg by mouth daily.   Yes Historical Provider, MD  insulin aspart (NOVOLOG) 100 UNIT/ML FlexPen Inject 7-10 Units into the skin 3 (three) times daily with meals. Per sliding scale   Yes Historical Provider, MD  Insulin Detemir (LEVEMIR) 100 UNIT/ML Pen Inject into the skin  daily at 10 pm. Dose varies based on CBG (FlexTouch Pen)   Yes Historical Provider, MD  linagliptin (TRADJENTA) 5 MG TABS tablet Take 5 mg by mouth daily. Tradjenta   Yes Historical Provider, MD  magnesium oxide (MAG-OX) 400 MG tablet Take 400 mg by mouth daily.   Yes Historical Provider, MD  metFORMIN (GLUCOPHAGE) 500 MG tablet Take 500 mg by mouth 2 (two) times daily. 06/29/14  Yes Historical Provider, MD  niacin (NIASPAN) 500 MG CR tablet Take 500 mg by mouth 2 (two) times daily.   Yes Historical Provider, MD  potassium chloride SA (K-DUR,KLOR-CON) 20 MEQ tablet Take 20 mEq by mouth daily.   Yes Historical Provider,  MD  potassium gluconate 595 MG TABS tablet Take 595 mg by mouth daily.   Yes Historical Provider, MD  telmisartan (MICARDIS) 40 MG tablet Take 40 mg by mouth daily. Micardis   Yes Historical Provider, MD  Vitamin D, Ergocalciferol, (DRISDOL) 50000 UNITS CAPS capsule Take 50,000 Units by mouth every 7 (seven) days. On Thursdays   Yes Historical Provider, MD   Physical Exam: Filed Vitals:   08/08/14 2100 08/08/14 2115 08/08/14 2135 08/09/14 0032  BP: 154/56 164/59 177/64 158/62  Pulse: 80 82 79 83  Temp:   98.2 F (36.8 C) 98.4 F (36.9 C)  TempSrc:   Oral   Resp: 19 22 18 17   Height:   5\' 3"  (1.6 m)   Weight:   101.152 kg (223 lb)   SpO2: 94% 94% 100% 100%     General:  Sleeping but arousable.  Oriented to person, place, time and situation.  NAD.  ENT: Moist mucous membranes.  No nasal drainage.  Neck: Supple.  Cardiovascular: NR/RR.  No LE edema.  SCDs in place.  Respiratory: CTA bilaterally listening anteriorly  Abdomen: Soft/NT/ND.  Bowel sounds are present.  No guarding.  Skin: Warm and dry.  Musculoskeletal: Moves all four extremities spontaneously.  Right arm dressing C/D/I.  Psychiatric: Normal affect.  Neurologic: No focal deficits.  Labs on Admission:  Basic Metabolic Panel:  Recent Labs Lab 08/07/14 1545  NA 140  K 4.4  CL 106  CO2 24  GLUCOSE 270*  BUN 23  CREATININE 1.08  CALCIUM 9.4   CBC:  Recent Labs Lab 08/07/14 1545  WBC 7.6  HGB 12.8  HCT 41.2  MCV 97.9  PLT 184   CBG:  Recent Labs Lab 08/08/14 1535 08/08/14 2020 08/09/14 0042  GLUCAP 147* 119* 156*   EKG: Independently reviewed. NSR.  No acute ST segment changes.  Poor R wave progression in her anterior leads.  No prior EKGs for comparison.  Assessment/Plan Active Problems:   Distal radius fracture IDDM HTN CHF, type unknown  1.  IDDM --Agree with sliding scale insulin coverage until post-operative appetite improves.  Can add low dose long-acting insulin as  needed.  Continue to hold metformin for now.  We would want to avoid hypoglycemia. 2.  CHF, type unknown, appears compensated and without stigmata of Acute Coronary Syndrome at this point. --Continue home meds --Caution advised with IV fluids 3.  Accelerated HTN --Expect this time improve with home medications, which are ordered for AM 4.  DVT prophylaxis per primary team  AM labs ordered.  Thank you for this consultation.  [facility] will continue to follow along with you while patient remains in the hospital.   Time spent: 45 minutes  [employer] [facility]  08/09/2014, 2:43 AM

## 2014-08-09 NOTE — Op Note (Signed)
NAMELIEZEL, SHISLER NO.:  [id number]  MEDICAL RECORD NO.:  [id number]  LOCATION:  6N31C                        FACILITY:  [facility]  PHYSICIAN:  Satira Anis. Wyolene Weimann, M.D.DATE OF BIRTH:  December 12, 1930  DATE OF PROCEDURE: DATE OF DISCHARGE:                              OPERATIVE REPORT   PREOPERATIVE DIAGNOSES: 1. Comminuted complex, nascent malunion right wrist intra-articular in     nature greater than 5-part. 2. 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  POSTOPERATIVE DIAGNOSES: 1. Comminuted complex, nascent malunion right wrist intra-articular in     nature greater than 5-part. 2. 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  PROCEDURE: 1. Open reduction and internal fixation, with allograft bone grafting,     DVR plate and screw construct, right wrist. 2. Right open carpal tunnel release. 3. Allograft bone graft, dorsal deep defect, right wrist. 4. AP lateral and oblique radiographs performed, examined, and     interpreted by myself.  SURGEON:  Satira Anis. Amedeo Plenty, MD  ASSISTANT:  Avelina Laine, PA-C  COMPLICATIONS:  None.  ANESTHESIA:  General, preoperative block.  TOURNIQUET TIME:  Less than 90 minutes.  INDICATIONS:  This patient is a pleasant female, who presents with the above-mentioned diagnosis.  I have counseled her in regard to risks, benefits, surgery.  She presented late in her fracture process.  She has median nerve dysesthesias and due to this, we have planned the above- mentioned procedure.  OPERATION IN DETAIL:  The patient was seen by myself and Anesthesia, taken to the operative theater and underwent smooth induction of general anesthesia.  Preoperatively, Dr. Kalman Shan placed a block in her arm for postop analgesia.  Once in the operative theater, the patient then underwent very careful and cautious approach to the arm with preop scrub followed  by second surgical scrub and paint with Betadine followed by sterile field being secured.  A time-out was called.  Tourniquet was insufflated 250 mmHg and following this, the patient underwent a volar incision.  Dissection was carried down through the skin.  FCR tendon sheath was incised palmarly and dorsally.  Carpal canal contents were retracted ulnarly.  Fasciotomy was accomplished about the volar form fascia.  Following this, the pronator was identified and released in an L-shaped fashion.  She had a large spike of her main shaft of bone, this was very carefully teased away from the pronator.  Following this, the horrible comminuted nature of her fracture was noted.  I placed a Soil scientist to try to reduce the fracture and get a back down to at least normal length as it was severely compromised.  This time, we spent a lot of time packing bone graft in a dorsal deep defect very tightly.  Once this was complete, the patient then underwent a very careful and cautious approach with provisional fixation utilizing Kirschner wire followed by a DVR plate and screw construct being applied.  She was highly comminuted.  We were very careful in our application of the plate, actually we were able to achieve radial height inclination and volar tilt  to our satisfaction.  I checked multiple times placed under live fluoro to make sure that she did not have problems with instability and following the entire plate and screw application, the patient then underwent live fluoro demonstrating good stability. The patient tolerated this well.  There were no complicating features.  Following this, I stress tested the wrist one more time under live fluoro to make sure all looked well.  I then irrigated with a L saline, closed the pronator with Vicryl, and placed a TLS drain.  Following this, an open carpal tunnel release was performed.  The incision was made over the carpal tunnel region.  I did not cross  the distal wrist crease, but went right up to that point. Dissection was carried down.  Palmar fascia was incised and the patient underwent very careful and cautious release of the transverse carpal ligament.  The patient tolerated this well.  There were no complicating features.  Once transcarpal ligament was released and fat pad egressed nicely.  The patient then underwent a very careful and cautious approach to the proximal leaflet and antebrachial fascia.  This was released under direct 4.0 Loupe magnification.  The median nerve was noted to be hyperemic intact and bruise.  Following this, we deflated the tourniquet, irrigated copiously, obtained hemostasis with bipolar electrocautery and then closed the carpal tunnel wound with Prolene and the main incision with Prolene as well.  TLS drain was hooked up to suction.  She was placed in a sugar- tong splint with long-arm splint extension according to our standard protocol for highly comminuted fracture.  We will monitor closely.  I would consider early bone stimulation in this patient and we will request this given the nascent malunion issues.  These notes have been discussed and all questions have been encouraged and answered.  Should any problems arise, she will notify us.  This was an unusual degree of comminution.  It was a late presentation and given the dorsal deep defect, I would like to see if we can get some bone stimulation going early.  These notes have been discussed.  All questions have been encouraged and answered.  We will be available.  She is somewhat unsteady in her gait pattern and due to this, we are going to have therapy work with her and see if she meets criteria for skilled nursing facility which I would recommend personally.  These notes have been discussed and all questions have been encouraged and answered.     Satira Anis. Amedeo Plenty, M.D.     [facility]  D:  08/08/2014  T:  08/09/2014  Job:  [id number]

## 2014-08-09 NOTE — Evaluation (Signed)
Physical Therapy Evaluation Patient Details Name: Brandi Melton MRN: [id number] DOB: [birth year] Today's Date: 08/09/2014   History of Present Illness  81 y.o. who suffered nascent malunion right radius fracture status post fall. Pt now s/p Right OPEN REDUCTION INTERNAL FIXATION (ORIF) WRIST FRACTURE WITH REPAIR AND RECONST/ALLOGRAFT AND BONE GRAFT (Right) and right open carpal tunnel release.  Clinical Impression  Pt functioning at minA level and is compliant with R UE NWB through wrist/hand. Pt requires 24/7 assist upon d/c for ADLs and safe ambulation/transfers. Pt's daughter reports she is unable to provide that level of assist due to recent shoulder surgery. Recommend ST-SNF to allow pt to progress to safe mod I level of function for safe transition home.    Follow Up Recommendations SNF;Supervision/Assistance - 24 hour    Equipment Recommendations   (R platform walker)    Recommendations for Other Services       Precautions / Restrictions Precautions Precautions: Fall Precaution Comments: elevation and edema control; no pushing, pulling, lifting with RUE (can use to hold light items) Restrictions Weight Bearing Restrictions: Yes RUE Weight Bearing: Non weight bearing      Mobility  Bed Mobility Overal bed mobility: Needs Assistance Bed Mobility: Supine to Sit     Supine to sit: Mod assist     General bed mobility comments:  (assist for trunk elevation, HOB elevated)  Transfers Overall transfer level: Needs assistance Equipment used: 1 person hand held assist Transfers: Sit to/from Stand Sit to Stand: Min assist         General transfer comment: assist for first time up. pt compliant with R UE NWB  Ambulation/Gait Ambulation/Gait assistance: Min assist Ambulation Distance (Feet): 50 Feet Assistive device: Right platform walker;1 person hand held assist Gait Pattern/deviations: Step-through pattern;Decreased stride length Gait velocity: slow   General Gait  Details: initially started with L HHA however pt very guarded with wide base of support and short step length. once given R platform walker pt with improved step length and less antalgia. pt able to maintain R wrist/hand NWB. Pt quickly fatigued requiring rest break.  Stairs            Wheelchair Mobility    Modified Rankin (Stroke Patients Only)       Balance Overall balance assessment: Needs assistance         Standing balance support: Single extremity supported Standing balance-Leahy Scale: Fair Standing balance comment: pt able to statically stand however requires UE support for dynamic activity                             Pertinent Vitals/Pain Pain Assessment: 0-10 Pain Score: 7  Pain Location: R UE Pain Descriptors / Indicators: Pressure;Tightness Pain Intervention(s): Monitored during session;Repositioned    Home Living Family/patient expects to be discharged to:: Private residence Living Arrangements: Alone Available Help at Discharge: Personal care attendant;Available PRN/intermittently Type of Home: House         Home Equipment: Bedside commode;Walker - 2 wheels Additional Comments: pt lives in [place] but came up to [facility] for family support after surgery. Pt 's dtrs house has stairs to access bed/bath.    Prior Function Level of Independence: Independent with assistive device(s)         Comments: pt used RW and was able to do all ADLs. Since fall 2 weeks ago pt has been using a hemi walker.     Hand Dominance   Dominant Hand: Right  Extremity/Trunk Assessment   Upper Extremity Assessment: Defer to OT evaluation;RUE deficits/detail (pt in splint) RUE Deficits / Details: edema in Rt hand RUE: Unable to fully assess due to immobilization RUE Sensation: decreased light touch     Lower Extremity Assessment: Generalized weakness         Communication   Communication: No difficulties  Cognition Arousal/Alertness:  Awake/alert Behavior During Therapy: WFL for tasks assessed/performed Overall Cognitive Status: Within Functional Limits for tasks assessed                      General Comments      Exercises Other Exercises Other Exercises: retrograde massage to Rt digits Other Exercises: Moving digits      Assessment/Plan    PT Assessment Patient needs continued PT services  PT Diagnosis Difficulty walking;Abnormality of gait   PT Problem List Decreased strength;Decreased range of motion;Decreased activity tolerance;Decreased balance;Decreased mobility  PT Treatment Interventions DME instruction;Gait training;Functional mobility training;Therapeutic activities;Therapeutic exercise;Balance training   PT Goals (Current goals can be found in the Care Plan section) Acute Rehab PT Goals Patient Stated Goal: go to rehab PT Goal Formulation: With patient Time For Goal Achievement: 08/23/14 Potential to Achieve Goals: Good    Frequency Min 4X/week   Barriers to discharge Decreased caregiver support family unable to provide 24/7 assist and daughter unable to provide physical assist due to recent shoulder surgery    Co-evaluation               End of Session Equipment Utilized During Treatment: Gait belt Activity Tolerance: Patient tolerated treatment well Patient left: in chair;with call bell/phone within reach;with family/visitor present Nurse Communication: Mobility status    Functional Assessment Tool Used: clinical judgement Functional Limitation: Mobility: Walking and moving around Mobility: Walking and Moving Around Current Status [phone]): At least 60 percent but less than 80 percent impaired, limited or restricted Mobility: Walking and Moving Around Goal Status [phone]): At least 1 percent but less than 20 percent impaired, limited or restricted    Time: 1036-1101 PT Time Calculation (min) (ACUTE ONLY): 25 min   Charges:   PT Evaluation $Initial PT Evaluation Tier  I: 1 Procedure PT Treatments $Gait Training: 8-22 mins   PT G Codes:   PT G-Codes **NOT FOR INPATIENT CLASS** Functional Assessment Tool Used: clinical judgement Functional Limitation: Mobility: Walking and moving around Mobility: Walking and Moving Around Current Status [id number]): At least 60 percent but less than 80 percent impaired, limited or restricted Mobility: Walking and Moving Around Goal Status [phone]): At least 1 percent but less than 20 percent impaired, limited or restricted    Kingsley Callander 08/09/2014, 2:40 PM   Kittie Plater, PT, DPT Pager #: [phone] Office #: [phone]

## 2014-08-09 NOTE — Evaluation (Signed)
Occupational Therapy Evaluation Patient Details Name: Brandi Melton MRN: [id number] DOB: [birth year] Today's Date: 08/09/2014    History of Present Illness 81 y.o. who suffered nascent malunion right radius fracture status post fall. Pt now s/p Right OPEN REDUCTION INTERNAL FIXATION (ORIF) WRIST FRACTURE WITH REPAIR AND RECONST/ALLOGRAFT AND BONE GRAFT (Right) and right open carpal tunnel release.   Clinical Impression   Pt s/p above. Pt independent with ADLs, prior to injury. Recommending SNF as pt lives alone. Feel pt will benefit from acute OT to increase independence with BADLs and address RUE prior to d/c.     Follow Up Recommendations  SNF    Equipment Recommendations  None recommended by OT    Recommendations for Other Services       Precautions / Restrictions Precautions Precautions: Fall Precaution Comments: elevation and edema control; no pushing, pulling, lifting with RUE (can use to hold light items) Restrictions Weight Bearing Restrictions: Yes RUE Weight Bearing: Non weight bearing      Mobility Bed Mobility               General bed mobility comments: not assessed  Transfers Overall transfer level: Needs assistance Equipment used: None Transfers: Sit to/from Stand Sit to Stand: Min guard              Balance  Min guard for ambulation-No LOB in session.                                          ADL Overall ADL's : Needs assistance/impaired                 Upper Body Dressing : Moderate assistance;Sitting   Lower Body Dressing: Moderate assistance;Sit to/from stand   Toilet Transfer: Min guard;Ambulation (chair)           Functional mobility during ADLs: Min guard General ADL Comments: Educated on UB dressing technique and discussed UB clothing that may easier to manage. Discussed d/c rehab options.     Vision     Perception     Praxis      Pertinent Vitals/Pain Pain Assessment: 0-10 Pain Score:   (7-8) Pain Location: RUE Pain Descriptors / Indicators: Pressure;Tingling Pain Intervention(s): Repositioned;Monitored during session     Hand Dominance Right   Extremity/Trunk Assessment Upper Extremity Assessment Upper Extremity Assessment: RUE deficits/detail RUE Deficits / Details: edema in Rt hand RUE: Unable to fully assess due to immobilization RUE Sensation: decreased light touch RUE Coordination: decreased fine motor   Lower Extremity Assessment Lower Extremity Assessment: Defer to PT evaluation       Communication Communication Communication: No difficulties   Cognition Arousal/Alertness: Awake/alert Behavior During Therapy: WFL for tasks assessed/performed Overall Cognitive Status: Within Functional Limits for tasks assessed                     General Comments       Exercises Exercises: Other exercises Other Exercises Other Exercises: retrograde massage to Rt digits Other Exercises: Moving digits   Shoulder Instructions      Home Living Family/patient expects to be discharged to:: Private residence (Independent Living ) Living Arrangements: Alone Available Help at Discharge: Personal care attendant;Available PRN/intermittently (has had aide coming in one hour a day) Type of Home: House             Bathroom Shower/Tub: Hospital doctor  Toilet: Standard     Home Equipment: Bedside commode   Additional Comments: please refer to PT evaluation for more home setup information and equipment      Prior Functioning/Environment Level of Independence: Independent        Comments: prior to injury pt was independent; since her fall, she has hired an Engineer, production to assist with bathing dressing and received assist with toilet clothing management.    OT Diagnosis: Acute pain   OT Problem List: Decreased strength;Decreased range of motion;Decreased activity tolerance;Pain;Impaired UE functional use;Impaired sensation;Increased edema;Decreased  knowledge of use of DME or AE;Decreased knowledge of precautions;Decreased coordination   OT Treatment/Interventions: Self-care/ADL training;DME and/or AE instruction;Therapeutic activities;Patient/family education;Balance training;Therapeutic exercise    OT Goals(Current goals can be found in the care plan section) Acute Rehab OT Goals Patient Stated Goal: wants to go to rehab OT Goal Formulation: With patient Time For Goal Achievement: 08/16/14 Potential to Achieve Goals: Good ADL Goals Pt Will Perform Upper Body Dressing: with min assist;sitting Pt Will Perform Lower Body Dressing: with min assist;sit to/from stand Pt Will Transfer to Toilet: with supervision;ambulating (3 in 1 over commode) Additional ADL Goal #1: Pt will independently verbalize and demonstrate 3/3 edema management techniques for RUE.  OT Frequency: Min 2X/week   Barriers to D/C:            Co-evaluation              End of Session Equipment Utilized During Treatment: Gait belt  Activity Tolerance: Patient tolerated treatment well Patient left: in chair;with call bell/phone within reach   Time: 1233-1251 OT Time Calculation (min): 18 min Charges:  OT General Charges $OT Visit: 1 Procedure OT Evaluation $Initial OT Evaluation Tier I: 1 Procedure G-Codes: OT G-codes **NOT FOR INPATIENT CLASS** Functional Assessment Tool Used: clinical judgment Functional Limitation: Self care Self Care Current Status [id number]): At least 40 percent but less than 60 percent impaired, limited or restricted Self Care Goal Status [id number]): At least 20 percent but less than 40 percent impaired, limited or restricted  Tara Wich L 08/09/2014, 1:12 PM

## 2014-08-09 NOTE — Progress Notes (Signed)
Chaplain responded to spiritual care consult for pt with major life transition. Pt spoke about the difficulty of the last few months and the business of the weeks ahead. Pt has supportive extended family, but ifs finding it difficult to transition to life without her husband. Chaplain informed pt of Catholic communion schedule (MWF). Chaplain offered prayer and emotional support. Chaplain paged away to another call. Page chaplain as needed.    08/09/14 1400  Clinical Encounter Type  Visited With Patient  Visit Type Initial;Spiritual support  Referral From Nurse  Spiritual Encounters  Spiritual Needs Emotional;Prayer  Stress Factors  Patient Stress Factors Family relationships;Health changes  Brock Mokry, Epifanio Lesches 08/09/2014 2:25 PM

## 2014-08-09 NOTE — Clinical Social Work Note (Signed)
Clinical Social Work Assessment  Patient Details  Name: Brandi Melton MRN: [id number] Date of Birth: 15-Jul-1930  Date of referral:  08/09/14               Reason for consult:  Discharge Planning, Facility Placement                Permission sought to share information with:  Family Supports Permission granted to share information::  Yes, Verbal Permission Granted  Name::     [place]::     Relationship::  Adult Child, daughter  Sport and exercise psychologist Information:  Daughter, Lovey Newcomer: [phone]  Housing/Transportation Living arrangements for the past 2 months:  [place] of Information:  Patient Patient Interpreter Needed:  None Criminal Activity/Legal Involvement Pertinent to Current Situation/Hospitalization:  No - Comment as needed (Not appropriate at this time.) Significant Relationships:  Adult Children Lives with:  Self, Facility Resident Do you feel safe going back to the place where you live?  Yes (Per patient, will return to [facility] once patient has completed short-term rehabilitation.) Need for family participation in patient care:  Yes (Comment) (Patient daughter active in patient's care.)  Care giving concerns:  Patient admitted to [facility], lives in [place] in [place] here locally visiting with family. Patient requesting SNF placement at discharge, as patient's daughter states she is unable to care for patient at home. Patient and patient's daughter refusing to discharge patient home with home health services. Patient and patient's daughter open to possibly paying for SNF placement out-of-pocket.   Social Worker assessment / plan:  Patient's daughter requesting SNF placement at [facility], [facility], [facility], or [facility]. CSW has initiated SNF search in [facility] and contacted facilities previously listed. CSW and RNCM to meet with patient, patient's daughter, and patient's son-in-law at  bedside on 08/09/2014 at [place] (time scheduled by daughter).  Employment status:  Retired Forensic scientist:  Medicare PT Recommendations:  [place] / Referral to community resources:  [place]  Patient/Family's Response to care:  Patient and patient's daughter expressed frustration due to patient's observation status not qualifying patient for SNF placement. Patient and patient's daughter appreciative of CSW assistance, but expressed concern for patient's discharge disposition.  Patient/Family's Understanding of and Emotional Response to Diagnosis, Current Treatment, and Prognosis:  Patient and patient's daughter expressed frustration due to patient's observation status not qualifying patient for SNF placement. Patient and patient's daughter appreciative of CSW assistance, but expressed concern for patient's discharge disposition.  Emotional Assessment Appearance:  Appears younger than stated age Attitude/Demeanor/Rapport:  Crying Affect (typically observed):  Calm, Appropriate, Sad, Overwhelmed, Irritable, Frustrated, Tearful/Crying Orientation:  Oriented to Self, Oriented to Place, Oriented to  Time, Oriented to Situation Alcohol / Substance use:  Not Applicable Psych involvement (Current and /or in the community):  No (Comment) (Not appropriate at this time.)  Discharge Needs  Concerns to be addressed:  Discharge Planning Concerns Readmission within the last 30 days:  No Current discharge risk:  None Barriers to Discharge:  Insurance Authorization, Inadequate or no insurance, Other (Patient observation status per Medicare, does not qualify for SNF placement.)   Caroline Sauger, LCSW 08/09/2014, 1:49 PM [phone]

## 2014-08-10 DIAGNOSIS — S52501D Unspecified fracture of the lower end of right radius, subsequent encounter for closed fracture with routine healing: Secondary | ICD-10-CM

## 2014-08-10 DIAGNOSIS — I10 Essential (primary) hypertension: Secondary | ICD-10-CM | POA: Diagnosis not present

## 2014-08-10 DIAGNOSIS — E780 Pure hypercholesterolemia: Secondary | ICD-10-CM | POA: Diagnosis not present

## 2014-08-10 DIAGNOSIS — E119 Type 2 diabetes mellitus without complications: Secondary | ICD-10-CM | POA: Diagnosis not present

## 2014-08-10 DIAGNOSIS — Z794 Long term (current) use of insulin: Secondary | ICD-10-CM

## 2014-08-10 DIAGNOSIS — I509 Heart failure, unspecified: Secondary | ICD-10-CM | POA: Diagnosis not present

## 2014-08-10 DIAGNOSIS — S52571A Other intraarticular fracture of lower end of right radius, initial encounter for closed fracture: Secondary | ICD-10-CM | POA: Diagnosis not present

## 2014-08-10 LAB — GLUCOSE, CAPILLARY
GLUCOSE-CAPILLARY: 331 mg/dL — AB (ref 70–99)
Glucose-Capillary: 226 mg/dL — ABNORMAL HIGH (ref 70–99)

## 2014-08-10 LAB — HEMOGLOBIN A1C
HEMOGLOBIN A1C: 8.5 % — AB (ref 4.8–5.6)
Mean Plasma Glucose: 197 mg/dL

## 2014-08-10 MED ORDER — OXYCODONE HCL 5 MG PO TABS: 5.0000 mg | ORAL_TABLET | Freq: Four times a day (QID) | ORAL | Status: DC | PRN

## 2014-08-10 NOTE — Discharge Instructions (Signed)
..  Keep bandage clean and dry.  Call for any problems.  No smoking.  Criteria for driving a car: you should be off your pain medicine for 7-8 hours, able to drive one handed(confident), thinking clearly and feeling able in your judgement to drive. Continue elevation as it will decrease swelling.  If instructed by MD move your fingers within the confines of the bandage/splint.  Use ice if instructed by your MD. Call immediately for any sudden loss of feeling in your hand/arm or change in functional abilities of the extremity. .We recommend that you to take vitamin C 1000 mg a day to promote healing. We also recommend that if you require  pain medicine that you take a stool softener to prevent constipation as most pain medicines will have constipation side effects. We recommend either Peri-Colace or Senokot and recommend that you also consider adding MiraLAX to prevent the constipation affects from pain medicine if you are required to use them. These medicines are over the counter and maybe purchased at a local pharmacy. A cup of yogurt and a probiotic can also be helpful during the recovery process as the medicines can disrupt your intestinal environment. Avoid weightbearing with the right hand and or wrist, for weightbearing activities you may use her forearm and elbow about the right upper extremity. We do recommend a platform walker extension for ambulatory purposes.

## 2014-08-10 NOTE — Discharge Planning (Signed)
Report called to [facility], insulin sliding scale is not included on PTA list or discharge summary.  Pt's daughter will notify her MD in [facility] to fax complete orders regarding insulin flex pens to [facility]. Pt has her pens with her and can verbalize her SS and what she takes. Discharged at 1650 per w/c to private car  [place]. By daughter, with all personal belongs.

## 2014-08-10 NOTE — Progress Notes (Signed)
[facility] PROGRESS NOTE  Brandi Melton [id number] DOB: Oct 30, 1930 DOA: 08/08/2014 PCP: Pcp Not In System  Assessment/Plan: Insulin dependent DM: Would resume home dose of levemir and SSI on discharge. In addition to levemir , she was on dulaglutide injection, tradjenta and metformin. Plan to resume all diabetic medications on discharge.  Her hgba1c is 8.5. Recommend outpatient follow up with PCP.    Accelerated hypertension: Resolved.  Resume home meds on discharge.    Code Status: Full code.  Family Communication: none at bedside Disposition Plan: discharge today.   Procedures:  ORIF   HPI/Subjective: Comfortable denies any new complaints.   Objective: Filed Vitals:   08/10/14 0625  BP: 119/49  Pulse: 87  Temp: 98.6 F (37 C)  Resp: 16    Intake/Output Summary (Last 24 hours) at 08/10/14 0845 Last data filed at 08/10/14 0626  Gross per 24 hour  Intake 1463.25 ml  Output   2450 ml  Net -986.75 ml   Filed Weights   08/08/14 1512 08/08/14 2135  Weight: 101.56 kg (223 lb 14.4 oz) 101.152 kg (223 lb)    Exam:   General:  Alert afebrile comfortable  Cardiovascular: s1s2  Respiratory: ctab  Abdomen: soft non tender non distended.     Data Reviewed: Basic Metabolic Panel:  Recent Labs Lab 08/07/14 1545 08/09/14 0505  NA 140 137  K 4.4 4.0  CL 106 105  CO2 24 24  GLUCOSE 270* 162*  BUN 23 15  CREATININE 1.08 0.89  CALCIUM 9.4 8.3*   Liver Function Tests: No results for input(s): AST, ALT, ALKPHOS, BILITOT, PROT, ALBUMIN in the last 168 hours. No results for input(s): LIPASE, AMYLASE in the last 168 hours. No results for input(s): AMMONIA in the last 168 hours. CBC:  Recent Labs Lab 08/07/14 1545 08/09/14 0505  WBC 7.6 9.2  HGB 12.8 11.9*  HCT 41.2 38.4  MCV 97.9 98.2  PLT 184 175   Cardiac Enzymes: No results for input(s): CKTOTAL, CKMB, CKMBINDEX, TROPONINI in the last 168 hours. BNP (last 3 results) No results for  input(s): BNP in the last 8760 hours.  ProBNP (last 3 results) No results for input(s): PROBNP in the last 8760 hours.  CBG:  Recent Labs Lab 08/09/14 0741 08/09/14 1154 08/09/14 1656 08/09/14 2245 08/10/14 0725  GLUCAP 151* 282* 240* 216* 226*    No results found for this or any previous visit (from the past 240 hour(s)).   Studies: No results found.  Scheduled Meds: . amLODipine  2.5 mg Oral Daily  . atorvastatin  20 mg Oral q1800   And  . ezetimibe  10 mg Oral q1800  . carvedilol  80 mg Oral Daily  . cholecalciferol  3,000 Units Oral Daily  . folic acid  [id number] mcg Oral Daily  . furosemide  40 mg Oral Daily  . insulin aspart  0-15 Units Subcutaneous TID WC  . irbesartan  150 mg Oral Daily  . magnesium oxide  400 mg Oral Daily  . niacin  500 mg Oral BID  . potassium chloride SA  20 mEq Oral Daily  . senna  1 tablet Oral BID  . [START ON 08/14/2014] Vitamin D (Ergocalciferol)  50,000 Units Oral Q7 days   Continuous Infusions: . lactated ringers 20 mL/hr at 08/09/14 1509    Active Problems:   Distal radius fracture    Time spent: [place]  [facility] Pager [phone]. If 7PM-7AM, please contact night-coverage at [email or web address], password  [email or web address] 08/10/2014, 8:45 AM  LOS: 2 days

## 2014-08-10 NOTE — Discharge Planning (Signed)
Patient to be discharged to [facility]. Patient updated regarding discharge.  Facility: [facility] Report number: [id number] Transportation: Patient's daughter, Lovey Newcomer, providing transportation.  Lubertha Sayres, [place] Cell: [phone]       Fax: [phone] Clinical Social Work: Orthopedics [phone]) and Surgical [phone])

## 2014-08-10 NOTE — Discharge Summary (Signed)
Physician Discharge Summary  Patient ID: Brandi Melton MRN: [id number] DOB/AGE: 79-06-1930 81 y.o.  Admit date: 08/08/2014 Discharge date: 08/10/2014  Admission Diagnoses:status post fall sustaining a comminuted distal right radius fracture intra-articular in nature with signs of a nascent malunion Unsteady gait History of insulin-dependent diabetes mellitus History of CHF History of hypertension History of hypercholesterolemia . Patient Active Problem List   Diagnosis Date Noted  . Distal radius fracture 08/08/2014    Discharge Diagnoses: see above  status post open reduction internal fixation right distal radius fracture Patient Active Problem List   Diagnosis Date Noted  . Distal radius fracture 08/08/2014   Active Problems:   Distal radius fracture   Discharged Condition: stable  Hospital Course: the patient is a delightful 81 year old female who presented to our office setting for evaluation of her right wrist. She unfortunately sustained an injury to the right wrist after falling on the resulting in a comminuted displaced distal radius fracture. She is a resident of [place], however, her family resides hearing [place] and wanted her medical and surgical care to be performed here. She is now approaching 3 weeks status post her injury and does have signs of a nascent malunion present. We discussed all issues with she and her family at length preoperatively. She has a medical history consistent with insulin-dependent diabetes mellitus, CHF, hypertension and hypercholesterolemia. Preoperative clearance was noted. The patient was admitted on April 26 and underwent open reduction internal fixation about the right distal radius fracture, please see operative report for full details. She tolerated the procedure well and there was no complications. Hospitalis consult was obtained given her multiple medical comorbidities and followed her throughout her stay. She remained stable  medically and there were no postoperative complications present. Postoperative day #1 the patient was doing fairly well she was tolerating a car modified diet throughout difficulties. She was voiding without difficulties. Physical therapy and occupational therapy were consult did. It was noted that her gait was somewhat unsteady and certainly recommended that she have continued therapeutic endeavors after her hospital admit.  Postoperative day #2 the patient is doing very well she was having no significant difficulties, her pain was controlled at that juncture. She denied nausea or vomiting. She was voiding without difficulties and tolerating a car modified diet without difficulties. She was noted to have flatus but had not had a bowel movement. She stated that she typically had a bowel movement "every day and a half or so". Case care management had been consult at for discharge disposition planning. Ultimately, the decision was made to proceed to [facility] place continued short-term nursing care as well as therapeutic rehabilitation. Patient will need physical therapy and occupational therapy for gait training, general conditioning and strengthening. She will be nonweightbearing to the right wrist or hand, however, she may weight-bear through the forearm and elbow of the right upper extremity. We have recommended a platform extension/platform walker for ambulatory assistance. We will need to with her in regards to range of motion of her digits, massage, edema control on a daily basis. We will need to follow her up at our office in approximately 2 weeks for a wound check, sutures to be removed, and repeat radiographs to be performed. I have discussed all issues with the patient and have contacted her daughter,Brandi Melton, for any further questions or concerns. I have discussed with her family taking plans to obtain medical care hearing [facility] given her multiple medical comorbidities once she is discharged from  [facility] place. It  has been a pleasure participating in her operative care.  Consults: [facility]   Treatments: 1. Open reduction and internal fixation, with allograft bone grafting,  DVR plate and screw construct, right wrist. 2. Right open carpal tunnel release. 3. Allograft bone graft, dorsal deep defect, right wrist. 4. AP lateral and oblique radiographs performed, examined, and  interpreted by myself.   Discharge Exam: Blood pressure 119/49, pulse 87, temperature 98.6 F (37 C), temperature source Oral, resp. rate 16, height 5\' 3"  (1.6 m), weight 101.152 kg (223 lb), SpO2 92 %. .Patient presents for evaluation and treatment of the of their upper extremity predicament. The patient denies neck, back, chest or  abdominal pain. The patient notes that they have no lower extremity problems. The patients primary complaint is noted. We are planning surgical care pathway for the upper extremity. Examination of the right upper extremity shows that her dressings are clean, dry and intact. She has mild edema of the digits, her sensation is intact about the median nerve distribution status post carpal tunnel release. On her nerve and radial nerve distribution is intact. She is able to make a full fist and extend the digits without difficulties. There is no signs of ascending cellulitis or infection present.  Disposition: Final discharge disposition not confirmed  Discharge Instructions    Call MD / Call 911    Complete by:  As directed   If you experience chest pain or shortness of breath, CALL 911 and be transported to the hospital emergency room.  If you develope a fever above 101 F, pus (white drainage) or increased drainage or redness at the wound, or calf pain, call your surgeon's office.     Constipation Prevention    Complete by:  As directed   Drink plenty of fluids.  Prune juice may be helpful.  You may use a stool softener, such as Colace (over the counter) 100 mg twice  a day.  Use MiraLax (over the counter) for constipation as needed.     Diet - low sodium heart healthy    Complete by:  As directed      Discharge instructions    Complete by:  As directed   .[place] KitchenKeep bandage clean and dry.  Call for any problems.  No smoking.  Criteria for driving a car: you should be off your pain medicine for 7-8 hours, able to drive one handed(confident), thinking clearly and feeling able in your judgement to drive. Continue elevation as it will decrease swelling.  If instructed by MD move your fingers within the confines of the bandage/splint.  Use ice if instructed by your MD. Call immediately for any sudden loss of feeling in your hand/arm or change in functional abilities of the extremity. Please do not place weight on your operative hand or wrist. For weightbearing please use the forearm and elbow of the right upper extremity. .We recommend that you to take vitamin C 1000 mg a day to promote healing. We also recommend that if you require  pain medicine that you take a stool softener to prevent constipation as most pain medicines will have constipation side effects. We recommend either Peri-Colace or Senokot and recommend that you also consider adding MiraLAX to prevent the constipation affects from pain medicine if you are required to use them. These medicines are over the counter and maybe purchased at a local pharmacy. A cup of yogurt and a probiotic can also be helpful during the recovery process as the medicines can disrupt your  intestinal environment.     Increase activity slowly as tolerated    Complete by:  As directed             Medication List    TAKE these medications        acetaminophen 500 MG tablet  Commonly known as:  TYLENOL  Take 500 mg by mouth every 6 (six) hours as needed (pain).     amLODipine 5 MG tablet  Commonly known as:  NORVASC  Take 2.5 mg by mouth daily.     carvedilol 80 MG 24 hr capsule  Commonly known as:  COREG CR  Take 80 mg by  mouth daily.     cholecalciferol 1000 UNITS tablet  Commonly known as:  VITAMIN D  Take 3,000 Units by mouth daily.     Dulaglutide 0.75 MG/0.5ML Sopn  Inject 0.75 mg into the skin once a week. On Saturdays (Trulicity)     ezetimibe-simvastatin 10-40 MG per tablet  Commonly known as:  VYTORIN  Take 1 tablet by mouth at bedtime.     folic acid Q000111Q MCG tablet  Commonly known as:  FOLVITE  Take 400 mcg by mouth daily.     furosemide 40 MG tablet  Commonly known as:  LASIX  Take 40 mg by mouth daily.     insulin aspart 100 UNIT/ML FlexPen  Commonly known as:  NOVOLOG  Inject 7-10 Units into the skin 3 (three) times daily with meals. Per sliding scale     Insulin Detemir 100 UNIT/ML Pen  Commonly known as:  LEVEMIR  Inject into the skin daily at 10 pm. Dose varies based on CBG (FlexTouch Pen)     linagliptin 5 MG Tabs tablet  Commonly known as:  TRADJENTA  Take 5 mg by mouth daily. Tradjenta     magnesium oxide 400 MG tablet  Commonly known as:  MAG-OX  Take 400 mg by mouth daily.     metFORMIN 500 MG tablet  Commonly known as:  GLUCOPHAGE  Take 500 mg by mouth 2 (two) times daily.     niacin 500 MG CR tablet  Commonly known as:  NIASPAN  Take 500 mg by mouth 2 (two) times daily.     oxyCODONE 5 MG immediate release tablet  Commonly known as:  Oxy IR/ROXICODONE  Take 1 tablet (5 mg total) by mouth every 6 (six) hours as needed for moderate pain.     potassium chloride SA 20 MEQ tablet  Commonly known as:  K-DUR,KLOR-CON  Take 20 mEq by mouth daily.     potassium gluconate 595 MG Tabs tablet  Take 595 mg by mouth daily.     telmisartan 40 MG tablet  Commonly known as:  MICARDIS  Take 40 mg by mouth daily. Micardis     Vitamin D (Ergocalciferol) 50000 UNITS Caps capsule  Commonly known as:  DRISDOL  Take 50,000 Units by mouth every 7 (seven) days. On Thursdays           Follow-up Information    Follow up with Paulene Floor, MD In 2 weeks.    Specialty:  Orthopedic Surgery   Why:  our office will contact you with appointment date and time, please call for any questions or concerns   Contact information:   [address] [place] [address] [id number]       Signed: Ivan Melton 08/10/2014, 8:38 AM

## 2014-08-10 NOTE — Clinical Social Work Placement (Signed)
   CLINICAL SOCIAL WORK PLACEMENT  NOTE  Date:  08/10/2014  Patient Details  Name: Brandi Melton MRN: [id number] Date of Birth: 1931/03/06  Clinical Social Work is seeking post-discharge placement for this patient at the [place] level of care (*CSW will initial, date and re-position this form in  chart as items are completed):  Yes   Patient/family provided with [facility] Work Department's list of facilities offering this level of care within the geographic area requested by the patient (or if unable, by the patient's family).  Yes   Patient/family informed of their freedom to choose among providers that offer the needed level of care, that participate in Medicare, Medicaid or managed care program needed by the patient, have an available bed and are willing to accept the patient.  Yes   Patient/family informed of [facility]'s ownership interest in [facility] and [facility], as well as of the fact that they are under no obligation to receive care at these facilities.  PASRR submitted to EDS on 08/09/14     PASRR number received on 08/10/14     Existing PASRR number confirmed on       FL2 transmitted to all facilities in geographic area requested by pt/family on 08/10/14     FL2 transmitted to all facilities within larger geographic area on       Patient informed that his/her managed care company has contracts with or will negotiate with certain facilities, including the following:        Yes   Patient/family informed of bed offers received.  Patient chooses bed at [facility]     Physician recommends and patient chooses bed at      Patient to be transferred to [facility] on 08/10/14.  Patient to be transferred to facility by [facility]     Patient family notified on 08/10/14 of transfer.  Name of family member notified:  Patient alert and oriented.     PHYSICIAN       Additional Comment:     _______________________________________________ Caroline Sauger, LCSW 08/10/2014, 3:57 PM [phone]

## 2014-08-10 NOTE — Progress Notes (Signed)
Physical Therapy Treatment Patient Details Name: Brandi Melton MRN: [id number] DOB: 1930-08-23 Today's Date: 08/10/2014    History of Present Illness 81 y.o. who suffered nascent malunion right radius fracture status post fall. Pt now s/p Right OPEN REDUCTION INTERNAL FIXATION (ORIF) WRIST FRACTURE WITH REPAIR AND RECONST/ALLOGRAFT AND BONE GRAFT (Right) and right open carpal tunnel release.    PT Comments    Progressing towards physical therapy goals. Ambulating 100 feet today with min guard assist, using a platform walker for support. Patient will continue to benefit from skilled physical therapy services to further improve independence with functional mobility.   Follow Up Recommendations  SNF;Supervision/Assistance - 24 hour     Equipment Recommendations  Other (comment) (R platform walker)    Recommendations for Other Services       Precautions / Restrictions Precautions Precautions: Fall Precaution Comments: elevation and edema control; no pushing, pulling, lifting with RUE (can use to hold light items) Restrictions Weight Bearing Restrictions: Yes RUE Weight Bearing: Non weight bearing    Mobility  Bed Mobility Overal bed mobility: Needs Assistance Bed Mobility: Supine to Sit     Supine to sit: Min assist;HOB elevated     General bed mobility comments: Min assist for truncal support and for LUE to pull into upright position. VC for technique.  Transfers Overall transfer level: Needs assistance Equipment used: Right platform walker Transfers: Sit to/from Stand Sit to Stand: Min assist         General transfer comment: Min assist for balance and RUE support to stand from lowest bed setting. Weight on heels initially, leans posteriorly   Ambulation/Gait Ambulation/Gait assistance: Min guard Ambulation Distance (Feet): 100 Feet Assistive device: Right platform walker Gait Pattern/deviations: Step-through pattern;Decreased stride length;Drifts  right/left Gait velocity: slow   General Gait Details: Mild sway noted, cues for walker control with turns to avoid objects in hallway. Required one standing rest break to complete distance. Close guard for safety. No buckling of LEs noted.    Stairs            Wheelchair Mobility    Modified Rankin (Stroke Patients Only)       Balance                                    Cognition Arousal/Alertness: Awake/alert Behavior During Therapy: WFL for tasks assessed/performed Overall Cognitive Status: Within Functional Limits for tasks assessed                      Exercises      General Comments        Pertinent Vitals/Pain Pain Assessment: Faces Faces Pain Scale: Hurts even more Pain Location: RUE Pain Descriptors / Indicators: Grimacing;Guarding Pain Intervention(s): Monitored during session;Repositioned;Other (comment) (Elevated above heart level)    Home Living                      Prior Function            PT Goals (current goals can now be found in the care plan section) Acute Rehab PT Goals Patient Stated Goal: go to rehab PT Goal Formulation: With patient Time For Goal Achievement: 08/23/14 Potential to Achieve Goals: Good Progress towards PT goals: Progressing toward goals    Frequency  Min 4X/week    PT Plan Current plan remains appropriate    Co-evaluation  End of Session Equipment Utilized During Treatment: Gait belt Activity Tolerance: Patient tolerated treatment well Patient left: with call bell/phone within reach;in bed     Time: [id number] PT Time Calculation (min) (ACUTE ONLY): 21 min  Charges:  $Gait Training: 8-22 mins                    G Codes:      Ellouise Newer Sep 08, 2014, 4:51 PM Camille Bal [place], [place]

## 2014-08-11 DIAGNOSIS — Z4789 Encounter for other orthopedic aftercare: Secondary | ICD-10-CM | POA: Diagnosis not present

## 2014-08-11 DIAGNOSIS — R278 Other lack of coordination: Secondary | ICD-10-CM | POA: Diagnosis not present

## 2014-08-11 DIAGNOSIS — R2681 Unsteadiness on feet: Secondary | ICD-10-CM | POA: Diagnosis not present

## 2014-08-11 DIAGNOSIS — S52501D Unspecified fracture of the lower end of right radius, subsequent encounter for closed fracture with routine healing: Secondary | ICD-10-CM | POA: Diagnosis not present

## 2014-08-11 DIAGNOSIS — M6281 Muscle weakness (generalized): Secondary | ICD-10-CM | POA: Diagnosis not present

## 2014-08-12 DIAGNOSIS — R278 Other lack of coordination: Secondary | ICD-10-CM | POA: Diagnosis not present

## 2014-08-12 DIAGNOSIS — R2681 Unsteadiness on feet: Secondary | ICD-10-CM | POA: Diagnosis not present

## 2014-08-12 DIAGNOSIS — M6281 Muscle weakness (generalized): Secondary | ICD-10-CM | POA: Diagnosis not present

## 2014-08-12 DIAGNOSIS — Z4789 Encounter for other orthopedic aftercare: Secondary | ICD-10-CM | POA: Diagnosis not present

## 2014-08-12 DIAGNOSIS — S52501D Unspecified fracture of the lower end of right radius, subsequent encounter for closed fracture with routine healing: Secondary | ICD-10-CM | POA: Diagnosis not present

## 2014-08-13 DIAGNOSIS — Z4789 Encounter for other orthopedic aftercare: Secondary | ICD-10-CM | POA: Diagnosis not present

## 2014-08-13 DIAGNOSIS — I509 Heart failure, unspecified: Secondary | ICD-10-CM | POA: Diagnosis not present

## 2014-08-13 DIAGNOSIS — E785 Hyperlipidemia, unspecified: Secondary | ICD-10-CM | POA: Diagnosis not present

## 2014-08-13 DIAGNOSIS — S52501D Unspecified fracture of the lower end of right radius, subsequent encounter for closed fracture with routine healing: Secondary | ICD-10-CM | POA: Diagnosis not present

## 2014-08-13 DIAGNOSIS — R2681 Unsteadiness on feet: Secondary | ICD-10-CM | POA: Diagnosis not present

## 2014-08-13 DIAGNOSIS — M6281 Muscle weakness (generalized): Secondary | ICD-10-CM | POA: Diagnosis not present

## 2014-08-13 DIAGNOSIS — I10 Essential (primary) hypertension: Secondary | ICD-10-CM | POA: Diagnosis not present

## 2014-08-13 DIAGNOSIS — E119 Type 2 diabetes mellitus without complications: Secondary | ICD-10-CM | POA: Diagnosis not present

## 2014-08-13 DIAGNOSIS — R278 Other lack of coordination: Secondary | ICD-10-CM | POA: Diagnosis not present

## 2014-08-14 ENCOUNTER — Non-Acute Institutional Stay (SKILLED_NURSING_FACILITY): Payer: Medicare Other | Admitting: Adult Health

## 2014-08-14 DIAGNOSIS — I10 Essential (primary) hypertension: Secondary | ICD-10-CM | POA: Diagnosis not present

## 2014-08-14 DIAGNOSIS — Z4789 Encounter for other orthopedic aftercare: Secondary | ICD-10-CM | POA: Diagnosis not present

## 2014-08-14 DIAGNOSIS — E876 Hypokalemia: Secondary | ICD-10-CM

## 2014-08-14 DIAGNOSIS — K5900 Constipation, unspecified: Secondary | ICD-10-CM | POA: Diagnosis not present

## 2014-08-14 DIAGNOSIS — E785 Hyperlipidemia, unspecified: Secondary | ICD-10-CM

## 2014-08-14 DIAGNOSIS — I509 Heart failure, unspecified: Secondary | ICD-10-CM | POA: Diagnosis not present

## 2014-08-14 DIAGNOSIS — M6281 Muscle weakness (generalized): Secondary | ICD-10-CM | POA: Diagnosis not present

## 2014-08-14 DIAGNOSIS — E118 Type 2 diabetes mellitus with unspecified complications: Secondary | ICD-10-CM | POA: Diagnosis not present

## 2014-08-14 DIAGNOSIS — S52501D Unspecified fracture of the lower end of right radius, subsequent encounter for closed fracture with routine healing: Secondary | ICD-10-CM | POA: Diagnosis not present

## 2014-08-14 DIAGNOSIS — S52501S Unspecified fracture of the lower end of right radius, sequela: Secondary | ICD-10-CM

## 2014-08-14 DIAGNOSIS — E119 Type 2 diabetes mellitus without complications: Secondary | ICD-10-CM | POA: Diagnosis not present

## 2014-08-14 DIAGNOSIS — R2681 Unsteadiness on feet: Secondary | ICD-10-CM | POA: Diagnosis not present

## 2014-08-14 DIAGNOSIS — R278 Other lack of coordination: Secondary | ICD-10-CM | POA: Diagnosis not present

## 2014-08-15 ENCOUNTER — Non-Acute Institutional Stay: Payer: BLUE CROSS/BLUE SHIELD | Admitting: Internal Medicine

## 2014-08-15 DIAGNOSIS — E119 Type 2 diabetes mellitus without complications: Secondary | ICD-10-CM | POA: Diagnosis not present

## 2014-08-15 DIAGNOSIS — I10 Essential (primary) hypertension: Secondary | ICD-10-CM

## 2014-08-15 DIAGNOSIS — S52501D Unspecified fracture of the lower end of right radius, subsequent encounter for closed fracture with routine healing: Secondary | ICD-10-CM | POA: Diagnosis not present

## 2014-08-15 DIAGNOSIS — I509 Heart failure, unspecified: Secondary | ICD-10-CM

## 2014-08-15 DIAGNOSIS — S52501S Unspecified fracture of the lower end of right radius, sequela: Secondary | ICD-10-CM

## 2014-08-15 DIAGNOSIS — E118 Type 2 diabetes mellitus with unspecified complications: Secondary | ICD-10-CM | POA: Diagnosis not present

## 2014-08-15 DIAGNOSIS — E785 Hyperlipidemia, unspecified: Secondary | ICD-10-CM

## 2014-08-15 DIAGNOSIS — R278 Other lack of coordination: Secondary | ICD-10-CM | POA: Diagnosis not present

## 2014-08-15 DIAGNOSIS — M6281 Muscle weakness (generalized): Secondary | ICD-10-CM | POA: Diagnosis not present

## 2014-08-15 DIAGNOSIS — R2681 Unsteadiness on feet: Secondary | ICD-10-CM | POA: Diagnosis not present

## 2014-08-15 DIAGNOSIS — Z4789 Encounter for other orthopedic aftercare: Secondary | ICD-10-CM | POA: Diagnosis not present

## 2014-08-15 NOTE — Progress Notes (Signed)
Patient ID: Brandi Melton, female   DOB: 11-12-1930, 81 y.o.   MRN: [id number]     [facility]   PCP: Pcp Not In System  Code Status: DNR  No Known Allergies  Chief Complaint  Patient presents with  . New Admit To SNF     HPI:  81 year old patient is here for short term rehabilitation post hospital admission from 08/08/2014- 08/10/2014 post fall sustaining a comminuted intra-articular distal right radius fracture. She underwent open reduction and internal fixation. She has PMH of CHF, HTN, HLD, diabetes mellitus. She is seen in her room today. She denies any concerns this visit. Her pain is under control. On review of her cbg 187-261. She mentions being non complaint with her diet. Also DBP has been on lower side mostly in 50s  Review of Systems:  Constitutional: Negative for fever, chills, diaphoresis.  HENT: Negative for headache, congestion, nasal discharge Eyes: Negative for eye pain, blurred vision, double vision and discharge.  Respiratory: Negative for cough, shortness of breath and wheezing.   Cardiovascular: Negative for chest pain, palpitations, leg swelling.  Gastrointestinal: Negative for heartburn, nausea, vomiting, abdominal pain. Had bowel movement yesterday Genitourinary: Negative for dysuria Musculoskeletal: Negative for back pain, falls Skin: Negative for itching, rash.  Neurological: Negative for focal weakness Psychiatric/Behavioral: Negative for depression   Past Medical History  Diagnosis Date  . Diabetes mellitus without complication   . Hypertension   . Hyperlipemia   . Depression   . Myocardial infarction     Maybe a small one- unaware when  . Shortness of breath dyspnea     with exertion  . CHF (congestive heart failure)   . Sleep apnea   . Palsy, Bell's   . RA (rheumatoid arthritis)    Past Surgical History  Procedure Laterality Date  . Joint replacement Bilateral     Hips  . Knee arthroplasty Left   .  Cholecystectomy    . Cardiac catheterization  2013ish  . Colonoscopy    . Wrist fracture surgery Right 08/08/2014  . Orif wrist fracture Right 08/08/2014    Procedure: Right OPEN REDUCTION INTERNAL FIXATION (ORIF) WRIST FRACTURE WITH REPAIR AND RECONST/ALLOGRAFT AND BONE GRAFT;  Surgeon: Roseanne Kaufman, MD;  Location: [place];  Service: Orthopedics;  Laterality: Right;  . Carpal tunnel release Right 08/08/2014    Procedure: OPEN CARPAL TUNNEL RELEASE;  Surgeon: Roseanne Kaufman, MD;  Location: [place];  Service: Orthopedics;  Laterality: Right;   Social History:   reports that she has never smoked. She has never used smokeless tobacco. She reports that she does not drink alcohol or use illicit drugs.  No family history on file.  Medications: Patient's Medications  New Prescriptions   No medications on file  Previous Medications   ACETAMINOPHEN (TYLENOL) 500 MG TABLET    Take 500 mg by mouth every 6 (six) hours as needed (pain).   AMLODIPINE (NORVASC) 5 MG TABLET    Take 2.5 mg by mouth daily.    CARVEDILOL (COREG CR) 80 MG 24 HR CAPSULE    Take 80 mg by mouth daily.   CHOLECALCIFEROL (VITAMIN D) 1000 UNITS TABLET    Take 3,000 Units by mouth daily.   DULAGLUTIDE 0.75 MG/0.5ML SOPN    Inject 0.75 mg into the skin once a week. On Saturdays (Trulicity)   EZETIMIBE-SIMVASTATIN (VYTORIN) 10-40 MG PER TABLET    Take 1 tablet by mouth at bedtime.   FOLIC ACID (FOLVITE) [id number]  MCG TABLET    Take 400 mcg by mouth daily.   FUROSEMIDE (LASIX) 40 MG TABLET    Take 40 mg by mouth daily.   INSULIN ASPART (NOVOLOG) 100 UNIT/ML FLEXPEN    Inject 7-10 Units into the skin 3 (three) times daily with meals. Per sliding scale   INSULIN DETEMIR (LEVEMIR) 100 UNIT/ML PEN    Inject into the skin daily at 10 pm. Dose varies based on CBG (FlexTouch Pen)   LINAGLIPTIN (TRADJENTA) 5 MG TABS TABLET    Take 5 mg by mouth daily. Tradjenta   MAGNESIUM OXIDE (MAG-OX) 400 MG TABLET    Take 400 mg by mouth daily.   METFORMIN  (GLUCOPHAGE) 500 MG TABLET    Take 500 mg by mouth 2 (two) times daily.   NIACIN (NIASPAN) 500 MG CR TABLET    Take 500 mg by mouth 2 (two) times daily.   OXYCODONE (OXY IR/ROXICODONE) 5 MG IMMEDIATE RELEASE TABLET    Take 1 tablet (5 mg total) by mouth every 6 (six) hours as needed for moderate pain.   POTASSIUM CHLORIDE SA (K-DUR,KLOR-CON) 20 MEQ TABLET    Take 20 mEq by mouth daily.   POTASSIUM GLUCONATE 595 MG TABS TABLET    Take 595 mg by mouth daily.   TELMISARTAN (MICARDIS) 40 MG TABLET    Take 40 mg by mouth daily. Micardis   VITAMIN D, ERGOCALCIFEROL, (DRISDOL) 50000 UNITS CAPS CAPSULE    Take 50,000 Units by mouth every 7 (seven) days. On Thursdays  Modified Medications   No medications on file  Discontinued Medications   No medications on file     Physical Exam: Filed Vitals:   08/15/14 1919  BP: 112/60  Pulse: 74  Temp: 99 F (37.2 C)  Resp: 18  Weight: 225 lb (102.059 kg)  SpO2: 94%    General- elderly female, in no acute distress Head- normocephalic, atraumatic Throat- moist mucus membrane Eyes- PERRLA, EOMI, no pallor, no icterus, no discharge, normal conjunctiva, normal sclera Neck- no cervical lymphadenopathy Cardiovascular- normal s1,s2, no murmurs, palpable dorsalis pedis and left radial pulses, no leg edema Respiratory- bilateral clear to auscultation, no wheeze, no rhonchi, no crackles, no use of accessory muscles Abdomen- bowel sounds present, soft, non tender Musculoskeletal- able to move all 4 extremities, right forearm in cast with a sling, able to move her fingers, good capillary refill  Neurological- no focal deficit Skin- warm and dry Psychiatry- alert and oriented, normal mood and affect    Labs reviewed: Basic Metabolic Panel:  Recent Labs  08/07/14 1545 08/09/14 0505  NA 140 137  K 4.4 4.0  CL 106 105  CO2 24 24  GLUCOSE 270* 162*  BUN 23 15  CREATININE 1.08 0.89  CALCIUM 9.4 8.3*   CBC:  Recent Labs  08/07/14 1545  08/09/14 0505  WBC 7.6 9.2  HGB 12.8 11.9*  HCT 41.2 38.4  MCV 97.9 98.2  PLT 184 175   Cardiac Enzymes: No results for input(s): CKTOTAL, CKMB, CKMBINDEX, TROPONINI in the last 8760 hours. BNP: Invalid input(s): POCBNP CBG:  Recent Labs  08/09/14 2245 08/10/14 0725 08/10/14 1113  GLUCAP 216* 226* 331*      Assessment/Plan  Right distal radius fracture S/p ORIF. Non weight bearing to right wrist and hand for now. Has follow up with orthopedics. Continue oxyIR 5 mg q6h prn for pain.   Diabetes mellitus onitor cbg, dietary compliance reinforced. Continue levemir 25 u daily with premeal SSI aspart. Continue tradjenta and metformin.  Continue once a week dulaglutide. Continue ARB and vytorin  Blood loss anemia Post op, monitor h&h  HTN Diastolic bp readings have been low. Discontinue norvasc for now. Monitor bp readings. Continue coreg CR 80 mg daily, micardis 40 mg daily and lasix 40 mg daily. Monitor bmp  Hyperlipidemia Continue vytorin home regimen  Vitamin d def Continue vitamin d 50,000 u once a week   CHF Appears euvolemic on exam. Continue coreg, micardis and lasix for now. Monitor bmp and weight    Goals of care: short term rehabilitation    Labs/tests ordered: cbc, bmp in 1 week  Family/ staff Communication: reviewed care plan with patient and nursing supervisor    Blanchie Serve, MD  [facility] [phone] (Monday-Friday 8 am - 5 pm) [phone] (afterhours)

## 2014-08-16 DIAGNOSIS — R278 Other lack of coordination: Secondary | ICD-10-CM | POA: Diagnosis not present

## 2014-08-16 DIAGNOSIS — M6281 Muscle weakness (generalized): Secondary | ICD-10-CM | POA: Diagnosis not present

## 2014-08-16 DIAGNOSIS — E119 Type 2 diabetes mellitus without complications: Secondary | ICD-10-CM | POA: Diagnosis not present

## 2014-08-16 DIAGNOSIS — Z4789 Encounter for other orthopedic aftercare: Secondary | ICD-10-CM | POA: Diagnosis not present

## 2014-08-16 DIAGNOSIS — R2681 Unsteadiness on feet: Secondary | ICD-10-CM | POA: Diagnosis not present

## 2014-08-16 DIAGNOSIS — S52501D Unspecified fracture of the lower end of right radius, subsequent encounter for closed fracture with routine healing: Secondary | ICD-10-CM | POA: Diagnosis not present

## 2014-08-17 DIAGNOSIS — Z4789 Encounter for other orthopedic aftercare: Secondary | ICD-10-CM | POA: Diagnosis not present

## 2014-08-17 DIAGNOSIS — R278 Other lack of coordination: Secondary | ICD-10-CM | POA: Diagnosis not present

## 2014-08-17 DIAGNOSIS — S52501D Unspecified fracture of the lower end of right radius, subsequent encounter for closed fracture with routine healing: Secondary | ICD-10-CM | POA: Diagnosis not present

## 2014-08-17 DIAGNOSIS — R2681 Unsteadiness on feet: Secondary | ICD-10-CM | POA: Diagnosis not present

## 2014-08-17 DIAGNOSIS — M6281 Muscle weakness (generalized): Secondary | ICD-10-CM | POA: Diagnosis not present

## 2014-08-17 DIAGNOSIS — E119 Type 2 diabetes mellitus without complications: Secondary | ICD-10-CM | POA: Diagnosis not present

## 2014-08-18 ENCOUNTER — Non-Acute Institutional Stay (SKILLED_NURSING_FACILITY): Payer: Medicare Other | Admitting: Adult Health

## 2014-08-18 ENCOUNTER — Encounter: Payer: Self-pay | Admitting: Adult Health

## 2014-08-18 DIAGNOSIS — I10 Essential (primary) hypertension: Secondary | ICD-10-CM

## 2014-08-18 DIAGNOSIS — E785 Hyperlipidemia, unspecified: Secondary | ICD-10-CM

## 2014-08-18 DIAGNOSIS — I509 Heart failure, unspecified: Secondary | ICD-10-CM

## 2014-08-18 DIAGNOSIS — E118 Type 2 diabetes mellitus with unspecified complications: Secondary | ICD-10-CM | POA: Diagnosis not present

## 2014-08-18 DIAGNOSIS — E876 Hypokalemia: Secondary | ICD-10-CM | POA: Diagnosis not present

## 2014-08-18 DIAGNOSIS — S52501S Unspecified fracture of the lower end of right radius, sequela: Secondary | ICD-10-CM

## 2014-08-18 DIAGNOSIS — K5900 Constipation, unspecified: Secondary | ICD-10-CM | POA: Diagnosis not present

## 2014-08-18 DIAGNOSIS — S52501D Unspecified fracture of the lower end of right radius, subsequent encounter for closed fracture with routine healing: Secondary | ICD-10-CM | POA: Diagnosis not present

## 2014-08-18 DIAGNOSIS — M6281 Muscle weakness (generalized): Secondary | ICD-10-CM | POA: Diagnosis not present

## 2014-08-18 DIAGNOSIS — R278 Other lack of coordination: Secondary | ICD-10-CM | POA: Diagnosis not present

## 2014-08-18 DIAGNOSIS — R2681 Unsteadiness on feet: Secondary | ICD-10-CM | POA: Diagnosis not present

## 2014-08-18 DIAGNOSIS — Z4789 Encounter for other orthopedic aftercare: Secondary | ICD-10-CM | POA: Diagnosis not present

## 2014-08-18 DIAGNOSIS — E119 Type 2 diabetes mellitus without complications: Secondary | ICD-10-CM | POA: Diagnosis not present

## 2014-08-19 DIAGNOSIS — M6281 Muscle weakness (generalized): Secondary | ICD-10-CM | POA: Diagnosis not present

## 2014-08-19 DIAGNOSIS — R278 Other lack of coordination: Secondary | ICD-10-CM | POA: Diagnosis not present

## 2014-08-19 DIAGNOSIS — R2681 Unsteadiness on feet: Secondary | ICD-10-CM | POA: Diagnosis not present

## 2014-08-19 DIAGNOSIS — E119 Type 2 diabetes mellitus without complications: Secondary | ICD-10-CM | POA: Diagnosis not present

## 2014-08-19 DIAGNOSIS — S52501D Unspecified fracture of the lower end of right radius, subsequent encounter for closed fracture with routine healing: Secondary | ICD-10-CM | POA: Diagnosis not present

## 2014-08-19 DIAGNOSIS — Z4789 Encounter for other orthopedic aftercare: Secondary | ICD-10-CM | POA: Diagnosis not present

## 2014-08-23 DIAGNOSIS — G5601 Carpal tunnel syndrome, right upper limb: Secondary | ICD-10-CM | POA: Diagnosis not present

## 2014-08-23 DIAGNOSIS — S62101P Fracture of unspecified carpal bone, right wrist, subsequent encounter for fracture with malunion: Secondary | ICD-10-CM | POA: Diagnosis not present

## 2014-08-25 ENCOUNTER — Encounter: Payer: Self-pay | Admitting: Adult Health

## 2014-08-25 NOTE — Progress Notes (Signed)
Patient ID: Brandi Melton, female   DOB: Apr 05, 1931, 81 y.o.   MRN: [id number]   08/14/14  Facility:  Nursing Home Location:  [place] Room Number: [id number] LEVEL OF CARE:  SNF (31)   Chief Complaint  Patient presents with  . Hospitalization Follow-up    Right distal radius fracture S/P ORIF, hypertension, diabetes mellitus, constipation, hypokalemia, hyperlipidemia and CHF    HISTORY OF PRESENT ILLNESS:  This is an 81 year old female who has been admitted to [facility] on 08/10/14 from [facility]. She has PMH of diabetes mellitus, CHF, hypertension and hyperlipidemia. She fell and sustained a comminuted displaced distal radius fracture. She resides in  [place], [place]. However, her family lives in [place] and wanted her medical and surgical care performed here. It has been 3 weeks since she had the injury and had signs of malunion. She underwent ORIF of the right distal radius fracture on 4/26.  She has been admitted for a short-term rehabilitation.  PAST MEDICAL HISTORY:  Past Medical History  Diagnosis Date  . Diabetes mellitus without complication   . Hypertension   . Hyperlipemia   . Depression   . Myocardial infarction     Maybe a small one- unaware when  . Shortness of breath dyspnea     with exertion  . CHF (congestive heart failure)   . Sleep apnea   . Palsy, Bell's   . RA (rheumatoid arthritis)     CURRENT MEDICATIONS: Reviewed per MAR/see medication list  No Known Allergies   REVIEW OF SYSTEMS:  GENERAL: no change in appetite, no fatigue, no weight changes, no fever, chills or weakness RESPIRATORY: no cough, SOB, DOE, wheezing, hemoptysis CARDIAC: no chest pain, or palpitations GI: no abdominal pain, diarrhea, constipation, heart burn, nausea or vomiting  PHYSICAL EXAMINATION  GENERAL: no acute distress, obese EYES: conjunctivae normal, sclerae normal, normal eye lids NECK: supple, trachea midline, no neck  masses, no thyroid tenderness, no thyromegaly LYMPHATICS: no LAN in the neck, no supraclavicular LAN RESPIRATORY: breathing is even & unlabored, BS CTAB CARDIAC: RRR, no murmur,no extra heart sounds, BLE edema 1+ GI: abdomen soft, normal BS, no masses, no tenderness, no hepatomegaly, no splenomegaly EXTREMITIES:  Right forearm with cast, wrapped in ACE bandage with sling PSYCHIATRIC: the patient is alert & oriented to person, affect & behavior appropriate  LABS/RADIOLOGY: Labs reviewed: Basic Metabolic Panel:  Recent Labs  08/07/14 1545 08/09/14 0505  NA 140 137  K 4.4 4.0  CL 106 105  CO2 24 24  GLUCOSE 270* 162*  BUN 23 15  CREATININE 1.08 0.89  CALCIUM 9.4 8.3*   CBC:  Recent Labs  08/07/14 1545 08/09/14 0505  WBC 7.6 9.2  HGB 12.8 11.9*  HCT 41.2 38.4  MCV 97.9 98.2  PLT 184 175   CBG:  Recent Labs  08/09/14 2245 08/10/14 0725 08/10/14 1113  GLUCAP 216* 226* 331*     ASSESSMENT/PLAN:  Right distal radius fracture S/P ORIF - for rehabilitation; continue oxycodone 5 mg 1 tab by mouth every 6 hours when necessary and Tylenol 500 mg by mouth every 6 hours when necessary for pain; follow-up with Dr. Marily Memos, orthopedic surgery,  in 2 weeks Hypertension - continue amlodipine 5 mg 1/2 tab = 2.5 mg by mouth daily, Micardis 40 mg by mouth daily and Coreg CR 80 mg by mouth daily Diabetes mellitus, type II - hemoglobin A1c 8.5; continue metformin 500 mg by mouth twice a day, Dulaglutide 0.75mg /0.5  ml SQ Q Saturdays, Tradjenta 5 mg PO Q D and Novolog sliding scale TID with meals  Constipation - continue Colace 100 mg by mouth twice a day Hypokalemia - K4.0; continue K-Dur 20 MEQ by mouth daily and potassium gluconate 595 mg by mouth daily Hyperlipidemia - continue Vytorin 10-40 milligrams 1 tab by mouth daily at bedtime CHF - continue Lasix 40 mg by mouth daily    Goals of care:  Short-term rehabilitation   Labs/test ordered:  none  Spent 50 minutes in patient  care.   [facility], NP [employer] [phone]

## 2014-08-25 NOTE — Progress Notes (Signed)
Patient ID: KANYLA ZEIEN, female   DOB: 1930-12-04, 81 y.o.   MRN: [id number]   08/18/14  Facility:  Nursing Home Location:  [place] Room Number: [id number] LEVEL OF CARE:  SNF (31)   Chief Complaint  Patient presents with  . Discharge Note    Right distal radius fracture S/P ORIF, hypertension, diabetes mellitus, constipation, hypokalemia, hyperlipidemia and CHF    HISTORY OF PRESENT ILLNESS:  This is an 81 year old female who is for discharge home with Home health PT, OT and Home health aide. DME:  Bariatric bedside commode. She has been admitted to [facility] on 08/10/14 from [facility]. She has PMH of diabetes mellitus, CHF, hypertension and hyperlipidemia. She fell and sustained a comminuted displaced distal radius fracture. She resides in [place], [place]. However, her family lives in [place] and wanted her medical and surgical care performed here. It has been 3 weeks since she had the injury and had signs of malunion. She underwent ORIF of the right distal radius fracture on 4/26.  Patient was admitted to this facility for short-term rehabilitation after the patient's recent hospitalization.  Patient has completed SNF rehabilitation and therapy has cleared the patient for discharge.  PAST MEDICAL HISTORY:  Past Medical History  Diagnosis Date  . Diabetes mellitus without complication   . Hypertension   . Hyperlipemia   . Depression   . Myocardial infarction     Maybe a small one- unaware when  . Shortness of breath dyspnea     with exertion  . CHF (congestive heart failure)   . Sleep apnea   . Palsy, Bell's   . RA (rheumatoid arthritis)     CURRENT MEDICATIONS: Reviewed per MAR/see medication list  No Known Allergies   REVIEW OF SYSTEMS:  GENERAL: no change in appetite, no fatigue, no weight changes, no fever, chills or weakness RESPIRATORY: no cough, SOB, DOE, wheezing, hemoptysis CARDIAC: no chest pain, or palpitations GI:  no abdominal pain, diarrhea, constipation, heart burn, nausea or vomiting  PHYSICAL EXAMINATION  GENERAL: no acute distress, obese NECK: supple, trachea midline, no neck masses, no thyroid tenderness, no thyromegaly LYMPHATICS: no LAN in the neck, no supraclavicular LAN RESPIRATORY: breathing is even & unlabored, BS CTAB CARDIAC: RRR, no murmur,no extra heart sounds, BLE edema 1+ GI: abdomen soft, normal BS, no masses, no tenderness, no hepatomegaly, no splenomegaly EXTREMITIES:  Right forearm with cast, wrapped in ACE bandage with sling PSYCHIATRIC: the patient is alert & oriented to person, affect & behavior appropriate  LABS/RADIOLOGY: Labs reviewed: Basic Metabolic Panel:  Recent Labs  08/07/14 1545 08/09/14 0505  NA 140 137  K 4.4 4.0  CL 106 105  CO2 24 24  GLUCOSE 270* 162*  BUN 23 15  CREATININE 1.08 0.89  CALCIUM 9.4 8.3*   CBC:  Recent Labs  08/07/14 1545 08/09/14 0505  WBC 7.6 9.2  HGB 12.8 11.9*  HCT 41.2 38.4  MCV 97.9 98.2  PLT 184 175   CBG:  Recent Labs  08/09/14 2245 08/10/14 0725 08/10/14 1113  GLUCAP 216* 226* 331*     ASSESSMENT/PLAN:  Right distal radius fracture S/P ORIF - for Home health PT, OT and Home health aide; continue oxycodone 5 mg 1 tab by mouth every 6 hours when necessary and Tylenol 500 mg by mouth every 6 hours when necessary for pain Hypertension - Micardis 40 mg by mouth daily and Coreg CR 80 mg by mouth daily; Norvasc was recently discontinued  Diabetes mellitus, type II - hemoglobin A1c 8.5; continue  Levemir Insulin SQ 25 units SQ daily, metformin 500 mg by mouth twice a day, Dulaglutide 0.75mg /0.5 ml SQ Q Saturdays, Tradjenta 5 mg PO Q D and Novolog sliding scale TID with meals  Constipation - continue Colace 100 mg by mouth twice a day Hypokalemia - K4.0; continue K-Dur 20 MEQ by mouth daily and potassium gluconate 595 mg by mouth daily Hyperlipidemia - continue Vytorin 10-40 mg 1 tab by mouth daily at bedtime CHF  - continue Lasix 40 mg by mouth daily    I have filled out patient's discharge paperwork and written prescriptions.  Patient will receive home health PT, OT and Home health aide.  DME provided:  Bariatric bedside commode  Total discharge time: Greater than 30 minutes  Discharge time involved coordination of the discharge process with social worker, nursing staff and therapy department. Medical justification for home health services/DME verified.     [facility], NP [employer] [phone]

## 2014-08-28 DIAGNOSIS — S52501D Unspecified fracture of the lower end of right radius, subsequent encounter for closed fracture with routine healing: Secondary | ICD-10-CM | POA: Diagnosis not present

## 2014-09-01 DIAGNOSIS — S52501D Unspecified fracture of the lower end of right radius, subsequent encounter for closed fracture with routine healing: Secondary | ICD-10-CM | POA: Diagnosis not present

## 2014-09-04 DIAGNOSIS — S52501A Unspecified fracture of the lower end of right radius, initial encounter for closed fracture: Secondary | ICD-10-CM | POA: Diagnosis not present

## 2014-09-07 DIAGNOSIS — S52501D Unspecified fracture of the lower end of right radius, subsequent encounter for closed fracture with routine healing: Secondary | ICD-10-CM | POA: Diagnosis not present

## 2014-09-07 DIAGNOSIS — Z4789 Encounter for other orthopedic aftercare: Secondary | ICD-10-CM | POA: Diagnosis not present

## 2014-09-15 DIAGNOSIS — I509 Heart failure, unspecified: Secondary | ICD-10-CM | POA: Diagnosis not present

## 2014-09-15 DIAGNOSIS — E1165 Type 2 diabetes mellitus with hyperglycemia: Secondary | ICD-10-CM | POA: Diagnosis not present

## 2014-09-15 DIAGNOSIS — I10 Essential (primary) hypertension: Secondary | ICD-10-CM | POA: Diagnosis not present

## 2014-09-19 DIAGNOSIS — R609 Edema, unspecified: Secondary | ICD-10-CM | POA: Diagnosis not present

## 2014-09-19 DIAGNOSIS — E1122 Type 2 diabetes mellitus with diabetic chronic kidney disease: Secondary | ICD-10-CM | POA: Diagnosis not present

## 2014-09-19 DIAGNOSIS — F39 Unspecified mood [affective] disorder: Secondary | ICD-10-CM | POA: Diagnosis not present

## 2014-09-19 DIAGNOSIS — Z6841 Body Mass Index (BMI) 40.0 and over, adult: Secondary | ICD-10-CM | POA: Diagnosis not present

## 2014-09-19 DIAGNOSIS — I10 Essential (primary) hypertension: Secondary | ICD-10-CM | POA: Diagnosis not present

## 2014-09-19 DIAGNOSIS — E782 Mixed hyperlipidemia: Secondary | ICD-10-CM | POA: Diagnosis not present

## 2014-09-19 DIAGNOSIS — G4730 Sleep apnea, unspecified: Secondary | ICD-10-CM | POA: Diagnosis not present

## 2014-09-20 DIAGNOSIS — G5601 Carpal tunnel syndrome, right upper limb: Secondary | ICD-10-CM | POA: Diagnosis not present

## 2014-09-20 DIAGNOSIS — S52571A Other intraarticular fracture of lower end of right radius, initial encounter for closed fracture: Secondary | ICD-10-CM | POA: Diagnosis not present

## 2014-09-21 DIAGNOSIS — I10 Essential (primary) hypertension: Secondary | ICD-10-CM | POA: Diagnosis not present

## 2014-09-21 DIAGNOSIS — E1165 Type 2 diabetes mellitus with hyperglycemia: Secondary | ICD-10-CM | POA: Diagnosis not present

## 2014-09-21 DIAGNOSIS — E042 Nontoxic multinodular goiter: Secondary | ICD-10-CM | POA: Diagnosis not present

## 2014-09-21 DIAGNOSIS — Z7983 Long term (current) use of bisphosphonates: Secondary | ICD-10-CM | POA: Diagnosis not present

## 2014-09-21 DIAGNOSIS — E780 Pure hypercholesterolemia: Secondary | ICD-10-CM | POA: Diagnosis not present

## 2014-09-25 DIAGNOSIS — S52571D Other intraarticular fracture of lower end of right radius, subsequent encounter for closed fracture with routine healing: Secondary | ICD-10-CM | POA: Diagnosis not present

## 2014-09-28 DIAGNOSIS — S52571D Other intraarticular fracture of lower end of right radius, subsequent encounter for closed fracture with routine healing: Secondary | ICD-10-CM | POA: Diagnosis not present

## 2014-10-05 DIAGNOSIS — S52571D Other intraarticular fracture of lower end of right radius, subsequent encounter for closed fracture with routine healing: Secondary | ICD-10-CM | POA: Diagnosis not present

## 2014-10-23 DIAGNOSIS — S52571D Other intraarticular fracture of lower end of right radius, subsequent encounter for closed fracture with routine healing: Secondary | ICD-10-CM | POA: Diagnosis not present

## 2014-10-24 DIAGNOSIS — E782 Mixed hyperlipidemia: Secondary | ICD-10-CM | POA: Diagnosis not present

## 2014-10-24 DIAGNOSIS — I509 Heart failure, unspecified: Secondary | ICD-10-CM | POA: Diagnosis not present

## 2014-10-24 DIAGNOSIS — G4730 Sleep apnea, unspecified: Secondary | ICD-10-CM | POA: Diagnosis not present

## 2014-10-24 DIAGNOSIS — Z6838 Body mass index (BMI) 38.0-38.9, adult: Secondary | ICD-10-CM | POA: Diagnosis not present

## 2014-10-24 DIAGNOSIS — I10 Essential (primary) hypertension: Secondary | ICD-10-CM | POA: Diagnosis not present

## 2014-10-24 DIAGNOSIS — E669 Obesity, unspecified: Secondary | ICD-10-CM | POA: Diagnosis not present

## 2014-10-24 DIAGNOSIS — E1122 Type 2 diabetes mellitus with diabetic chronic kidney disease: Secondary | ICD-10-CM | POA: Diagnosis not present

## 2014-10-24 DIAGNOSIS — R05 Cough: Secondary | ICD-10-CM | POA: Diagnosis not present

## 2014-10-24 DIAGNOSIS — E139 Other specified diabetes mellitus without complications: Secondary | ICD-10-CM | POA: Diagnosis not present

## 2014-10-26 DIAGNOSIS — S52571D Other intraarticular fracture of lower end of right radius, subsequent encounter for closed fracture with routine healing: Secondary | ICD-10-CM | POA: Diagnosis not present

## 2014-10-26 DIAGNOSIS — I509 Heart failure, unspecified: Secondary | ICD-10-CM | POA: Diagnosis not present

## 2014-10-30 DIAGNOSIS — E559 Vitamin D deficiency, unspecified: Secondary | ICD-10-CM | POA: Diagnosis not present

## 2014-10-30 DIAGNOSIS — E1165 Type 2 diabetes mellitus with hyperglycemia: Secondary | ICD-10-CM | POA: Diagnosis not present

## 2014-10-30 DIAGNOSIS — I10 Essential (primary) hypertension: Secondary | ICD-10-CM | POA: Diagnosis not present

## 2014-10-30 DIAGNOSIS — R05 Cough: Secondary | ICD-10-CM | POA: Diagnosis not present

## 2014-10-30 DIAGNOSIS — S52571D Other intraarticular fracture of lower end of right radius, subsequent encounter for closed fracture with routine healing: Secondary | ICD-10-CM | POA: Diagnosis not present

## 2014-10-30 DIAGNOSIS — E780 Pure hypercholesterolemia: Secondary | ICD-10-CM | POA: Diagnosis not present

## 2014-10-30 DIAGNOSIS — E042 Nontoxic multinodular goiter: Secondary | ICD-10-CM | POA: Diagnosis not present

## 2014-10-30 DIAGNOSIS — D509 Iron deficiency anemia, unspecified: Secondary | ICD-10-CM | POA: Diagnosis not present

## 2014-10-30 DIAGNOSIS — Z79899 Other long term (current) drug therapy: Secondary | ICD-10-CM | POA: Diagnosis not present

## 2014-10-30 DIAGNOSIS — R0989 Other specified symptoms and signs involving the circulatory and respiratory systems: Secondary | ICD-10-CM | POA: Diagnosis not present

## 2014-10-30 DIAGNOSIS — I509 Heart failure, unspecified: Secondary | ICD-10-CM | POA: Diagnosis not present

## 2014-10-31 DIAGNOSIS — R609 Edema, unspecified: Secondary | ICD-10-CM | POA: Diagnosis not present

## 2014-11-02 DIAGNOSIS — S52571D Other intraarticular fracture of lower end of right radius, subsequent encounter for closed fracture with routine healing: Secondary | ICD-10-CM | POA: Diagnosis not present

## 2014-11-02 DIAGNOSIS — S52571A Other intraarticular fracture of lower end of right radius, initial encounter for closed fracture: Secondary | ICD-10-CM | POA: Diagnosis not present

## 2014-11-02 DIAGNOSIS — G5601 Carpal tunnel syndrome, right upper limb: Secondary | ICD-10-CM | POA: Diagnosis not present

## 2014-11-06 DIAGNOSIS — S52571D Other intraarticular fracture of lower end of right radius, subsequent encounter for closed fracture with routine healing: Secondary | ICD-10-CM | POA: Diagnosis not present

## 2014-11-09 DIAGNOSIS — S52571D Other intraarticular fracture of lower end of right radius, subsequent encounter for closed fracture with routine healing: Secondary | ICD-10-CM | POA: Diagnosis not present

## 2014-11-13 DIAGNOSIS — S52571D Other intraarticular fracture of lower end of right radius, subsequent encounter for closed fracture with routine healing: Secondary | ICD-10-CM | POA: Diagnosis not present

## 2014-11-16 DIAGNOSIS — I429 Cardiomyopathy, unspecified: Secondary | ICD-10-CM | POA: Diagnosis not present

## 2014-11-16 DIAGNOSIS — S52571D Other intraarticular fracture of lower end of right radius, subsequent encounter for closed fracture with routine healing: Secondary | ICD-10-CM | POA: Diagnosis not present

## 2014-11-16 DIAGNOSIS — E785 Hyperlipidemia, unspecified: Secondary | ICD-10-CM | POA: Diagnosis not present

## 2014-11-20 DIAGNOSIS — S52571D Other intraarticular fracture of lower end of right radius, subsequent encounter for closed fracture with routine healing: Secondary | ICD-10-CM | POA: Diagnosis not present

## 2014-11-23 DIAGNOSIS — S52571D Other intraarticular fracture of lower end of right radius, subsequent encounter for closed fracture with routine healing: Secondary | ICD-10-CM | POA: Diagnosis not present

## 2014-11-27 DIAGNOSIS — S52571D Other intraarticular fracture of lower end of right radius, subsequent encounter for closed fracture with routine healing: Secondary | ICD-10-CM | POA: Diagnosis not present

## 2014-11-28 DIAGNOSIS — N183 Chronic kidney disease, stage 3 (moderate): Secondary | ICD-10-CM | POA: Diagnosis not present

## 2014-11-28 DIAGNOSIS — E782 Mixed hyperlipidemia: Secondary | ICD-10-CM | POA: Diagnosis not present

## 2014-11-28 DIAGNOSIS — E139 Other specified diabetes mellitus without complications: Secondary | ICD-10-CM | POA: Diagnosis not present

## 2014-11-28 DIAGNOSIS — F39 Unspecified mood [affective] disorder: Secondary | ICD-10-CM | POA: Diagnosis not present

## 2014-11-28 DIAGNOSIS — R609 Edema, unspecified: Secondary | ICD-10-CM | POA: Diagnosis not present

## 2014-11-28 DIAGNOSIS — I10 Essential (primary) hypertension: Secondary | ICD-10-CM | POA: Diagnosis not present

## 2014-11-28 DIAGNOSIS — Z6841 Body Mass Index (BMI) 40.0 and over, adult: Secondary | ICD-10-CM | POA: Diagnosis not present

## 2014-11-30 DIAGNOSIS — S52571D Other intraarticular fracture of lower end of right radius, subsequent encounter for closed fracture with routine healing: Secondary | ICD-10-CM | POA: Diagnosis not present

## 2014-11-30 DIAGNOSIS — S52571A Other intraarticular fracture of lower end of right radius, initial encounter for closed fracture: Secondary | ICD-10-CM | POA: Diagnosis not present

## 2014-11-30 DIAGNOSIS — G5601 Carpal tunnel syndrome, right upper limb: Secondary | ICD-10-CM | POA: Diagnosis not present

## 2014-12-11 DIAGNOSIS — R609 Edema, unspecified: Secondary | ICD-10-CM | POA: Diagnosis not present

## 2014-12-14 DIAGNOSIS — R609 Edema, unspecified: Secondary | ICD-10-CM | POA: Diagnosis not present

## 2014-12-19 DIAGNOSIS — R609 Edema, unspecified: Secondary | ICD-10-CM | POA: Diagnosis not present

## 2014-12-20 DIAGNOSIS — I10 Essential (primary) hypertension: Secondary | ICD-10-CM | POA: Diagnosis not present

## 2014-12-20 DIAGNOSIS — E785 Hyperlipidemia, unspecified: Secondary | ICD-10-CM | POA: Diagnosis not present

## 2014-12-20 DIAGNOSIS — I509 Heart failure, unspecified: Secondary | ICD-10-CM | POA: Diagnosis not present

## 2014-12-22 DIAGNOSIS — Z794 Long term (current) use of insulin: Secondary | ICD-10-CM | POA: Diagnosis not present

## 2014-12-22 DIAGNOSIS — M1990 Unspecified osteoarthritis, unspecified site: Secondary | ICD-10-CM | POA: Diagnosis present

## 2014-12-22 DIAGNOSIS — Z7982 Long term (current) use of aspirin: Secondary | ICD-10-CM | POA: Diagnosis not present

## 2014-12-22 DIAGNOSIS — I5023 Acute on chronic systolic (congestive) heart failure: Secondary | ICD-10-CM | POA: Diagnosis present

## 2014-12-22 DIAGNOSIS — F329 Major depressive disorder, single episode, unspecified: Secondary | ICD-10-CM | POA: Diagnosis not present

## 2014-12-22 DIAGNOSIS — R0602 Shortness of breath: Secondary | ICD-10-CM | POA: Diagnosis not present

## 2014-12-22 DIAGNOSIS — Z6839 Body mass index (BMI) 39.0-39.9, adult: Secondary | ICD-10-CM | POA: Diagnosis not present

## 2014-12-22 DIAGNOSIS — E119 Type 2 diabetes mellitus without complications: Secondary | ICD-10-CM | POA: Diagnosis not present

## 2014-12-22 DIAGNOSIS — Z9114 Patient's other noncompliance with medication regimen: Secondary | ICD-10-CM | POA: Diagnosis present

## 2014-12-22 DIAGNOSIS — R32 Unspecified urinary incontinence: Secondary | ICD-10-CM | POA: Diagnosis present

## 2014-12-22 DIAGNOSIS — M810 Age-related osteoporosis without current pathological fracture: Secondary | ICD-10-CM | POA: Diagnosis present

## 2014-12-22 DIAGNOSIS — E785 Hyperlipidemia, unspecified: Secondary | ICD-10-CM | POA: Diagnosis not present

## 2014-12-22 DIAGNOSIS — Z79899 Other long term (current) drug therapy: Secondary | ICD-10-CM | POA: Diagnosis not present

## 2014-12-22 DIAGNOSIS — R269 Unspecified abnormalities of gait and mobility: Secondary | ICD-10-CM | POA: Diagnosis present

## 2014-12-22 DIAGNOSIS — I509 Heart failure, unspecified: Secondary | ICD-10-CM | POA: Diagnosis not present

## 2014-12-22 DIAGNOSIS — I5020 Unspecified systolic (congestive) heart failure: Secondary | ICD-10-CM | POA: Diagnosis not present

## 2014-12-22 DIAGNOSIS — B373 Candidiasis of vulva and vagina: Secondary | ICD-10-CM | POA: Diagnosis present

## 2014-12-22 DIAGNOSIS — G4733 Obstructive sleep apnea (adult) (pediatric): Secondary | ICD-10-CM | POA: Diagnosis not present

## 2014-12-22 DIAGNOSIS — I10 Essential (primary) hypertension: Secondary | ICD-10-CM | POA: Diagnosis not present

## 2014-12-27 DIAGNOSIS — E119 Type 2 diabetes mellitus without complications: Secondary | ICD-10-CM | POA: Diagnosis not present

## 2014-12-27 DIAGNOSIS — F329 Major depressive disorder, single episode, unspecified: Secondary | ICD-10-CM | POA: Diagnosis not present

## 2014-12-27 DIAGNOSIS — Z794 Long term (current) use of insulin: Secondary | ICD-10-CM | POA: Diagnosis not present

## 2014-12-27 DIAGNOSIS — Z7982 Long term (current) use of aspirin: Secondary | ICD-10-CM | POA: Diagnosis not present

## 2014-12-27 DIAGNOSIS — Z9181 History of falling: Secondary | ICD-10-CM | POA: Diagnosis not present

## 2014-12-27 DIAGNOSIS — I5020 Unspecified systolic (congestive) heart failure: Secondary | ICD-10-CM | POA: Diagnosis not present

## 2014-12-27 DIAGNOSIS — I10 Essential (primary) hypertension: Secondary | ICD-10-CM | POA: Diagnosis not present

## 2014-12-28 DIAGNOSIS — Z9181 History of falling: Secondary | ICD-10-CM | POA: Diagnosis not present

## 2014-12-28 DIAGNOSIS — E782 Mixed hyperlipidemia: Secondary | ICD-10-CM | POA: Diagnosis not present

## 2014-12-28 DIAGNOSIS — E669 Obesity, unspecified: Secondary | ICD-10-CM | POA: Diagnosis not present

## 2014-12-28 DIAGNOSIS — N183 Chronic kidney disease, stage 3 (moderate): Secondary | ICD-10-CM | POA: Diagnosis not present

## 2014-12-28 DIAGNOSIS — F39 Unspecified mood [affective] disorder: Secondary | ICD-10-CM | POA: Diagnosis not present

## 2014-12-28 DIAGNOSIS — F329 Major depressive disorder, single episode, unspecified: Secondary | ICD-10-CM | POA: Diagnosis not present

## 2014-12-28 DIAGNOSIS — I509 Heart failure, unspecified: Secondary | ICD-10-CM | POA: Diagnosis not present

## 2014-12-28 DIAGNOSIS — I10 Essential (primary) hypertension: Secondary | ICD-10-CM | POA: Diagnosis not present

## 2014-12-28 DIAGNOSIS — I5020 Unspecified systolic (congestive) heart failure: Secondary | ICD-10-CM | POA: Diagnosis not present

## 2014-12-28 DIAGNOSIS — Z6839 Body mass index (BMI) 39.0-39.9, adult: Secondary | ICD-10-CM | POA: Diagnosis not present

## 2014-12-28 DIAGNOSIS — E119 Type 2 diabetes mellitus without complications: Secondary | ICD-10-CM | POA: Diagnosis not present

## 2014-12-28 DIAGNOSIS — R269 Unspecified abnormalities of gait and mobility: Secondary | ICD-10-CM | POA: Diagnosis not present

## 2014-12-29 DIAGNOSIS — I5020 Unspecified systolic (congestive) heart failure: Secondary | ICD-10-CM | POA: Diagnosis not present

## 2014-12-29 DIAGNOSIS — E119 Type 2 diabetes mellitus without complications: Secondary | ICD-10-CM | POA: Diagnosis not present

## 2014-12-29 DIAGNOSIS — F329 Major depressive disorder, single episode, unspecified: Secondary | ICD-10-CM | POA: Diagnosis not present

## 2014-12-29 DIAGNOSIS — I10 Essential (primary) hypertension: Secondary | ICD-10-CM | POA: Diagnosis not present

## 2014-12-29 DIAGNOSIS — Z9181 History of falling: Secondary | ICD-10-CM | POA: Diagnosis not present

## 2015-01-01 DIAGNOSIS — I5020 Unspecified systolic (congestive) heart failure: Secondary | ICD-10-CM | POA: Diagnosis not present

## 2015-01-01 DIAGNOSIS — I10 Essential (primary) hypertension: Secondary | ICD-10-CM | POA: Diagnosis not present

## 2015-01-01 DIAGNOSIS — F329 Major depressive disorder, single episode, unspecified: Secondary | ICD-10-CM | POA: Diagnosis not present

## 2015-01-01 DIAGNOSIS — R609 Edema, unspecified: Secondary | ICD-10-CM | POA: Diagnosis not present

## 2015-01-01 DIAGNOSIS — Z9181 History of falling: Secondary | ICD-10-CM | POA: Diagnosis not present

## 2015-01-01 DIAGNOSIS — E119 Type 2 diabetes mellitus without complications: Secondary | ICD-10-CM | POA: Diagnosis not present

## 2015-01-01 DIAGNOSIS — I509 Heart failure, unspecified: Secondary | ICD-10-CM | POA: Diagnosis not present

## 2015-01-02 DIAGNOSIS — F329 Major depressive disorder, single episode, unspecified: Secondary | ICD-10-CM | POA: Diagnosis not present

## 2015-01-02 DIAGNOSIS — I5020 Unspecified systolic (congestive) heart failure: Secondary | ICD-10-CM | POA: Diagnosis not present

## 2015-01-02 DIAGNOSIS — E119 Type 2 diabetes mellitus without complications: Secondary | ICD-10-CM | POA: Diagnosis not present

## 2015-01-02 DIAGNOSIS — I10 Essential (primary) hypertension: Secondary | ICD-10-CM | POA: Diagnosis not present

## 2015-01-02 DIAGNOSIS — Z9181 History of falling: Secondary | ICD-10-CM | POA: Diagnosis not present

## 2015-01-03 DIAGNOSIS — I5020 Unspecified systolic (congestive) heart failure: Secondary | ICD-10-CM | POA: Diagnosis not present

## 2015-01-03 DIAGNOSIS — I10 Essential (primary) hypertension: Secondary | ICD-10-CM | POA: Diagnosis not present

## 2015-01-03 DIAGNOSIS — E119 Type 2 diabetes mellitus without complications: Secondary | ICD-10-CM | POA: Diagnosis not present

## 2015-01-03 DIAGNOSIS — F329 Major depressive disorder, single episode, unspecified: Secondary | ICD-10-CM | POA: Diagnosis not present

## 2015-01-03 DIAGNOSIS — Z9181 History of falling: Secondary | ICD-10-CM | POA: Diagnosis not present

## 2015-01-04 DIAGNOSIS — F329 Major depressive disorder, single episode, unspecified: Secondary | ICD-10-CM | POA: Diagnosis not present

## 2015-01-04 DIAGNOSIS — E119 Type 2 diabetes mellitus without complications: Secondary | ICD-10-CM | POA: Diagnosis not present

## 2015-01-04 DIAGNOSIS — I10 Essential (primary) hypertension: Secondary | ICD-10-CM | POA: Diagnosis not present

## 2015-01-04 DIAGNOSIS — I5020 Unspecified systolic (congestive) heart failure: Secondary | ICD-10-CM | POA: Diagnosis not present

## 2015-01-04 DIAGNOSIS — Z9181 History of falling: Secondary | ICD-10-CM | POA: Diagnosis not present

## 2015-01-05 DIAGNOSIS — E119 Type 2 diabetes mellitus without complications: Secondary | ICD-10-CM | POA: Diagnosis not present

## 2015-01-05 DIAGNOSIS — Z9181 History of falling: Secondary | ICD-10-CM | POA: Diagnosis not present

## 2015-01-05 DIAGNOSIS — I5020 Unspecified systolic (congestive) heart failure: Secondary | ICD-10-CM | POA: Diagnosis not present

## 2015-01-05 DIAGNOSIS — I10 Essential (primary) hypertension: Secondary | ICD-10-CM | POA: Diagnosis not present

## 2015-01-05 DIAGNOSIS — F329 Major depressive disorder, single episode, unspecified: Secondary | ICD-10-CM | POA: Diagnosis not present

## 2015-01-08 DIAGNOSIS — E119 Type 2 diabetes mellitus without complications: Secondary | ICD-10-CM | POA: Diagnosis not present

## 2015-01-08 DIAGNOSIS — I5020 Unspecified systolic (congestive) heart failure: Secondary | ICD-10-CM | POA: Diagnosis not present

## 2015-01-08 DIAGNOSIS — F329 Major depressive disorder, single episode, unspecified: Secondary | ICD-10-CM | POA: Diagnosis not present

## 2015-01-08 DIAGNOSIS — I10 Essential (primary) hypertension: Secondary | ICD-10-CM | POA: Diagnosis not present

## 2015-01-08 DIAGNOSIS — Z9181 History of falling: Secondary | ICD-10-CM | POA: Diagnosis not present

## 2015-01-09 DIAGNOSIS — F39 Unspecified mood [affective] disorder: Secondary | ICD-10-CM | POA: Diagnosis not present

## 2015-01-09 DIAGNOSIS — I10 Essential (primary) hypertension: Secondary | ICD-10-CM | POA: Diagnosis not present

## 2015-01-09 DIAGNOSIS — E782 Mixed hyperlipidemia: Secondary | ICD-10-CM | POA: Diagnosis not present

## 2015-01-09 DIAGNOSIS — Z6839 Body mass index (BMI) 39.0-39.9, adult: Secondary | ICD-10-CM | POA: Diagnosis not present

## 2015-01-09 DIAGNOSIS — E669 Obesity, unspecified: Secondary | ICD-10-CM | POA: Diagnosis not present

## 2015-01-09 DIAGNOSIS — E119 Type 2 diabetes mellitus without complications: Secondary | ICD-10-CM | POA: Diagnosis not present

## 2015-01-09 DIAGNOSIS — R609 Edema, unspecified: Secondary | ICD-10-CM | POA: Diagnosis not present

## 2015-01-10 DIAGNOSIS — I5020 Unspecified systolic (congestive) heart failure: Secondary | ICD-10-CM | POA: Diagnosis not present

## 2015-01-10 DIAGNOSIS — E119 Type 2 diabetes mellitus without complications: Secondary | ICD-10-CM | POA: Diagnosis not present

## 2015-01-10 DIAGNOSIS — Z9181 History of falling: Secondary | ICD-10-CM | POA: Diagnosis not present

## 2015-01-10 DIAGNOSIS — F329 Major depressive disorder, single episode, unspecified: Secondary | ICD-10-CM | POA: Diagnosis not present

## 2015-01-10 DIAGNOSIS — I10 Essential (primary) hypertension: Secondary | ICD-10-CM | POA: Diagnosis not present

## 2015-01-11 DIAGNOSIS — F329 Major depressive disorder, single episode, unspecified: Secondary | ICD-10-CM | POA: Diagnosis not present

## 2015-01-11 DIAGNOSIS — I5020 Unspecified systolic (congestive) heart failure: Secondary | ICD-10-CM | POA: Diagnosis not present

## 2015-01-11 DIAGNOSIS — I10 Essential (primary) hypertension: Secondary | ICD-10-CM | POA: Diagnosis not present

## 2015-01-11 DIAGNOSIS — Z9181 History of falling: Secondary | ICD-10-CM | POA: Diagnosis not present

## 2015-01-11 DIAGNOSIS — E119 Type 2 diabetes mellitus without complications: Secondary | ICD-10-CM | POA: Diagnosis not present

## 2015-01-15 DIAGNOSIS — F329 Major depressive disorder, single episode, unspecified: Secondary | ICD-10-CM | POA: Diagnosis not present

## 2015-01-15 DIAGNOSIS — Z9181 History of falling: Secondary | ICD-10-CM | POA: Diagnosis not present

## 2015-01-15 DIAGNOSIS — E119 Type 2 diabetes mellitus without complications: Secondary | ICD-10-CM | POA: Diagnosis not present

## 2015-01-15 DIAGNOSIS — I5020 Unspecified systolic (congestive) heart failure: Secondary | ICD-10-CM | POA: Diagnosis not present

## 2015-01-15 DIAGNOSIS — I10 Essential (primary) hypertension: Secondary | ICD-10-CM | POA: Diagnosis not present

## 2015-01-16 DIAGNOSIS — I509 Heart failure, unspecified: Secondary | ICD-10-CM | POA: Diagnosis not present

## 2015-01-16 DIAGNOSIS — E559 Vitamin D deficiency, unspecified: Secondary | ICD-10-CM | POA: Diagnosis not present

## 2015-01-16 DIAGNOSIS — E1129 Type 2 diabetes mellitus with other diabetic kidney complication: Secondary | ICD-10-CM | POA: Diagnosis not present

## 2015-01-16 DIAGNOSIS — I5020 Unspecified systolic (congestive) heart failure: Secondary | ICD-10-CM | POA: Diagnosis not present

## 2015-01-16 DIAGNOSIS — I10 Essential (primary) hypertension: Secondary | ICD-10-CM | POA: Diagnosis not present

## 2015-01-16 DIAGNOSIS — E119 Type 2 diabetes mellitus without complications: Secondary | ICD-10-CM | POA: Diagnosis not present

## 2015-01-16 DIAGNOSIS — E7800 Pure hypercholesterolemia, unspecified: Secondary | ICD-10-CM | POA: Diagnosis not present

## 2015-01-16 DIAGNOSIS — Z794 Long term (current) use of insulin: Secondary | ICD-10-CM | POA: Diagnosis not present

## 2015-01-16 DIAGNOSIS — E785 Hyperlipidemia, unspecified: Secondary | ICD-10-CM | POA: Diagnosis not present

## 2015-01-16 DIAGNOSIS — N183 Chronic kidney disease, stage 3 (moderate): Secondary | ICD-10-CM | POA: Diagnosis not present

## 2015-01-16 DIAGNOSIS — E1165 Type 2 diabetes mellitus with hyperglycemia: Secondary | ICD-10-CM | POA: Diagnosis not present

## 2015-01-16 DIAGNOSIS — M810 Age-related osteoporosis without current pathological fracture: Secondary | ICD-10-CM | POA: Diagnosis not present

## 2015-01-17 DIAGNOSIS — F329 Major depressive disorder, single episode, unspecified: Secondary | ICD-10-CM | POA: Diagnosis not present

## 2015-01-17 DIAGNOSIS — I5020 Unspecified systolic (congestive) heart failure: Secondary | ICD-10-CM | POA: Diagnosis not present

## 2015-01-17 DIAGNOSIS — I10 Essential (primary) hypertension: Secondary | ICD-10-CM | POA: Diagnosis not present

## 2015-01-17 DIAGNOSIS — Z9181 History of falling: Secondary | ICD-10-CM | POA: Diagnosis not present

## 2015-01-17 DIAGNOSIS — E119 Type 2 diabetes mellitus without complications: Secondary | ICD-10-CM | POA: Diagnosis not present

## 2015-01-18 DIAGNOSIS — Z9181 History of falling: Secondary | ICD-10-CM | POA: Diagnosis not present

## 2015-01-18 DIAGNOSIS — I5020 Unspecified systolic (congestive) heart failure: Secondary | ICD-10-CM | POA: Diagnosis not present

## 2015-01-18 DIAGNOSIS — I10 Essential (primary) hypertension: Secondary | ICD-10-CM | POA: Diagnosis not present

## 2015-01-18 DIAGNOSIS — E119 Type 2 diabetes mellitus without complications: Secondary | ICD-10-CM | POA: Diagnosis not present

## 2015-01-18 DIAGNOSIS — F329 Major depressive disorder, single episode, unspecified: Secondary | ICD-10-CM | POA: Diagnosis not present

## 2015-01-22 DIAGNOSIS — E119 Type 2 diabetes mellitus without complications: Secondary | ICD-10-CM | POA: Diagnosis not present

## 2015-01-22 DIAGNOSIS — I10 Essential (primary) hypertension: Secondary | ICD-10-CM | POA: Diagnosis not present

## 2015-01-22 DIAGNOSIS — Z9181 History of falling: Secondary | ICD-10-CM | POA: Diagnosis not present

## 2015-01-22 DIAGNOSIS — I5020 Unspecified systolic (congestive) heart failure: Secondary | ICD-10-CM | POA: Diagnosis not present

## 2015-01-22 DIAGNOSIS — F329 Major depressive disorder, single episode, unspecified: Secondary | ICD-10-CM | POA: Diagnosis not present

## 2015-01-23 DIAGNOSIS — N183 Chronic kidney disease, stage 3 (moderate): Secondary | ICD-10-CM | POA: Diagnosis not present

## 2015-01-23 DIAGNOSIS — I509 Heart failure, unspecified: Secondary | ICD-10-CM | POA: Diagnosis not present

## 2015-01-23 DIAGNOSIS — E782 Mixed hyperlipidemia: Secondary | ICD-10-CM | POA: Diagnosis not present

## 2015-01-23 DIAGNOSIS — R609 Edema, unspecified: Secondary | ICD-10-CM | POA: Diagnosis not present

## 2015-01-23 DIAGNOSIS — F39 Unspecified mood [affective] disorder: Secondary | ICD-10-CM | POA: Diagnosis not present

## 2015-01-23 DIAGNOSIS — E119 Type 2 diabetes mellitus without complications: Secondary | ICD-10-CM | POA: Diagnosis not present

## 2015-01-23 DIAGNOSIS — I10 Essential (primary) hypertension: Secondary | ICD-10-CM | POA: Diagnosis not present

## 2015-01-23 DIAGNOSIS — Z6841 Body Mass Index (BMI) 40.0 and over, adult: Secondary | ICD-10-CM | POA: Diagnosis not present

## 2015-01-30 DIAGNOSIS — R609 Edema, unspecified: Secondary | ICD-10-CM | POA: Diagnosis not present

## 2015-02-01 DIAGNOSIS — R609 Edema, unspecified: Secondary | ICD-10-CM | POA: Diagnosis not present

## 2015-02-01 DIAGNOSIS — M79606 Pain in leg, unspecified: Secondary | ICD-10-CM | POA: Diagnosis not present

## 2015-02-01 DIAGNOSIS — I83891 Varicose veins of right lower extremities with other complications: Secondary | ICD-10-CM | POA: Diagnosis not present

## 2015-02-01 DIAGNOSIS — I10 Essential (primary) hypertension: Secondary | ICD-10-CM | POA: Diagnosis not present

## 2015-02-01 DIAGNOSIS — E559 Vitamin D deficiency, unspecified: Secondary | ICD-10-CM | POA: Diagnosis not present

## 2015-02-01 DIAGNOSIS — I872 Venous insufficiency (chronic) (peripheral): Secondary | ICD-10-CM | POA: Diagnosis not present

## 2015-02-01 DIAGNOSIS — E7800 Pure hypercholesterolemia, unspecified: Secondary | ICD-10-CM | POA: Diagnosis not present

## 2015-02-01 DIAGNOSIS — G4762 Sleep related leg cramps: Secondary | ICD-10-CM | POA: Diagnosis not present

## 2015-02-01 DIAGNOSIS — E119 Type 2 diabetes mellitus without complications: Secondary | ICD-10-CM | POA: Diagnosis not present

## 2015-02-01 DIAGNOSIS — Z79899 Other long term (current) drug therapy: Secondary | ICD-10-CM | POA: Diagnosis not present

## 2015-02-01 DIAGNOSIS — I83811 Varicose veins of right lower extremities with pain: Secondary | ICD-10-CM | POA: Diagnosis not present

## 2015-02-01 DIAGNOSIS — E1165 Type 2 diabetes mellitus with hyperglycemia: Secondary | ICD-10-CM | POA: Diagnosis not present

## 2015-02-01 DIAGNOSIS — Z794 Long term (current) use of insulin: Secondary | ICD-10-CM | POA: Diagnosis not present

## 2015-02-06 DIAGNOSIS — R609 Edema, unspecified: Secondary | ICD-10-CM | POA: Diagnosis not present

## 2015-02-08 DIAGNOSIS — R609 Edema, unspecified: Secondary | ICD-10-CM | POA: Diagnosis not present

## 2015-02-13 DIAGNOSIS — E782 Mixed hyperlipidemia: Secondary | ICD-10-CM | POA: Diagnosis not present

## 2015-02-13 DIAGNOSIS — E119 Type 2 diabetes mellitus without complications: Secondary | ICD-10-CM | POA: Diagnosis not present

## 2015-02-13 DIAGNOSIS — E785 Hyperlipidemia, unspecified: Secondary | ICD-10-CM | POA: Diagnosis not present

## 2015-02-13 DIAGNOSIS — E559 Vitamin D deficiency, unspecified: Secondary | ICD-10-CM | POA: Diagnosis not present

## 2015-02-13 DIAGNOSIS — Z6841 Body Mass Index (BMI) 40.0 and over, adult: Secondary | ICD-10-CM | POA: Diagnosis not present

## 2015-02-13 DIAGNOSIS — E1165 Type 2 diabetes mellitus with hyperglycemia: Secondary | ICD-10-CM | POA: Diagnosis not present

## 2015-02-13 DIAGNOSIS — Z79899 Other long term (current) drug therapy: Secondary | ICD-10-CM | POA: Diagnosis not present

## 2015-02-13 DIAGNOSIS — E1142 Type 2 diabetes mellitus with diabetic polyneuropathy: Secondary | ICD-10-CM | POA: Diagnosis not present

## 2015-02-13 DIAGNOSIS — I10 Essential (primary) hypertension: Secondary | ICD-10-CM | POA: Diagnosis not present

## 2015-02-13 DIAGNOSIS — N183 Chronic kidney disease, stage 3 (moderate): Secondary | ICD-10-CM | POA: Diagnosis not present

## 2015-03-01 ENCOUNTER — Encounter: Payer: Self-pay | Admitting: Internal Medicine

## 2015-03-01 ENCOUNTER — Ambulatory Visit: Payer: Medicare Other | Admitting: Endocrinology

## 2015-03-01 ENCOUNTER — Ambulatory Visit (INDEPENDENT_AMBULATORY_CARE_PROVIDER_SITE_OTHER): Payer: Medicare Other | Admitting: Internal Medicine

## 2015-03-01 VITALS — BP 100/60 | HR 64 | Ht 63.0 in | Wt 227.0 lb

## 2015-03-01 DIAGNOSIS — I10 Essential (primary) hypertension: Secondary | ICD-10-CM | POA: Diagnosis not present

## 2015-03-01 DIAGNOSIS — R0600 Dyspnea, unspecified: Secondary | ICD-10-CM | POA: Diagnosis not present

## 2015-03-01 LAB — BASIC METABOLIC PANEL
BUN: 40 mg/dL — ABNORMAL HIGH (ref 7–25)
CALCIUM: 9.9 mg/dL (ref 8.6–10.4)
CHLORIDE: 104 mmol/L (ref 98–110)
CO2: 21 mmol/L (ref 20–31)
Creat: 1.31 mg/dL — ABNORMAL HIGH (ref 0.60–0.88)
GLUCOSE: 75 mg/dL (ref 65–99)
POTASSIUM: 4.9 mmol/L (ref 3.5–5.3)
SODIUM: 140 mmol/L (ref 135–146)

## 2015-03-01 NOTE — Patient Instructions (Addendum)
Your physician recommends that you continue on your current medications as directed. Please refer to the Current Medication list given to you today.  Your physician recommends that you return for lab work today (BMET, BNP)   

## 2015-03-01 NOTE — Progress Notes (Signed)
Cardiology Office Note   Date:  03/01/2015   ID:  AISHA TROW, DOB [birth year], MRN [id number]  PCP:  Pcp Not In System  Cardiologist:   Dorris Carnes, MD   F/U of diastolic CHF     History of Present Illness: Brandi Melton is a 81 y.o. female with a history of  DM, HTN, HL, OSA and CHF  Just moved her from [facility] a couple days agoe Cath in Feb 2015 LVEF 65 to 70%  Minimal CAD of LAD, RCA  LCx dominant  Elvated R and L filling pressures   Echo in 2014  LVEF 65%   [place] LVH  Gr I diastolic CHF (A999333) She was hosp earlier this fall with volume overload  Most likely occurred because she stopped taking lasix regularly  She has been taking since d/c  She deneis CP  Breathing is OK at rest  Does give out with activity  No acute change  Uses support hose.  Doesn't elevate legs fully    Current Outpatient Prescriptions  Medication Sig Dispense Refill  . acetaminophen (TYLENOL) 500 MG tablet Take 500 mg by mouth every 6 (six) hours as needed (pain).    Marland Kitchen amLODipine (NORVASC) 5 MG tablet Take 2.5 mg by mouth daily.   3  . carvedilol (COREG CR) 80 MG 24 hr capsule Take 80 mg by mouth daily.    . chlorhexidine (PERIDEX) 0.12 % solution Use as directed 5 mLs in the mouth or throat 2 (two) times daily.     . cholecalciferol (VITAMIN D) 1000 UNITS tablet Take 3,000 Units by mouth daily.    . Dulaglutide 0.75 MG/0.5ML SOPN Inject 0.75 mg into the skin once a week. On Saturdays (Trulicity)    . ezetimibe-simvastatin (VYTORIN) 10-40 MG per tablet Take 1 tablet by mouth at bedtime.    . folic acid (FOLVITE) Q000111Q MCG tablet Take 400 mcg by mouth daily.    . furosemide (LASIX) 80 MG tablet Take 80 mg by mouth daily.     . insulin aspart (NOVOLOG) 100 UNIT/ML FlexPen Inject 7-10 Units into the skin 3 (three) times daily with meals. Per sliding scale    . Insulin Detemir (LEVEMIR) 100 UNIT/ML Pen Inject into the skin daily at 10 pm. Dose varies based on CBG (FlexTouch Pen)    . linagliptin (TRADJENTA) 5 MG  TABS tablet Take 5 mg by mouth daily. Tradjenta    . magnesium oxide (MAG-OX) 400 MG tablet Take 400 mg by mouth daily.    . metFORMIN (GLUCOPHAGE) 500 MG tablet Take 500 mg by mouth 2 (two) times daily.  11  . niacin (NIASPAN) 500 MG CR tablet Take 500 mg by mouth 2 (two) times daily.    Marland Kitchen oxyCODONE (OXY IR/ROXICODONE) 5 MG immediate release tablet Take 1 tablet (5 mg total) by mouth every 6 (six) hours as needed for moderate pain. 30 tablet 0  . potassium chloride SA (K-DUR,KLOR-CON) 20 MEQ tablet Take 20 mEq by mouth daily.    . potassium gluconate 595 MG TABS tablet Take 595 mg by mouth daily.    Marland Kitchen spironolactone (ALDACTONE) 25 MG tablet TK 1 T PO QD IN THE MORNING  2  . telmisartan (MICARDIS) 40 MG tablet Take 40 mg by mouth daily. Micardis    . Vitamin D, Ergocalciferol, (DRISDOL) 50000 UNITS CAPS capsule Take 50,000 Units by mouth every 7 (seven) days. On Thursdays     No current facility-administered medications for this visit.  Allergies:   Review of patient's allergies indicates no known allergies.   Past Medical History  Diagnosis Date  . Diabetes mellitus without complication ([place])   . Hypertension   . Hyperlipemia   . Depression   . Myocardial infarction [facility])     Maybe a small one- unaware when  . Shortness of breath dyspnea     with exertion  . CHF (congestive heart failure) ([place])   . Sleep apnea   . Palsy, Bell's   . RA (rheumatoid arthritis) [facility])     Past Surgical History  Procedure Laterality Date  . Joint replacement Bilateral     Hips  . Knee arthroplasty Left   . Cholecystectomy    . Cardiac catheterization  2013ish  . Colonoscopy    . Wrist fracture surgery Right 08/08/2014  . Orif wrist fracture Right 08/08/2014    Procedure: Right OPEN REDUCTION INTERNAL FIXATION (ORIF) WRIST FRACTURE WITH REPAIR AND RECONST/ALLOGRAFT AND BONE GRAFT;  Surgeon: Roseanne Kaufman, MD;  Location: [place];  Service: Orthopedics;  Laterality: Right;  . Carpal tunnel release  Right 08/08/2014    Procedure: OPEN CARPAL TUNNEL RELEASE;  Surgeon: Roseanne Kaufman, MD;  Location: [place];  Service: Orthopedics;  Laterality: Right;     Social History:  The patient  reports that she has never smoked. She has never used smokeless tobacco. She reports that she does not drink alcohol or use illicit drugs.   Family History:  The patient's parents with CAD  ROS:  Please see the history of present illness. All other systems are reviewed and  Negative to the above problem except as noted.    PHYSICAL EXAM: VS:  BP 100/60 mmHg  Pulse 64  Ht 5\' 3"  (1.6 m)  Wt 102.967 kg (227 lb)  BMI 40.22 kg/m2  SpO2 95%  GEN: Well nourished, well developed, in no acute distress HEENT: normal Neck: no JVD, carotid bruits, or masses Cardiac: RRR; no murmurs, rubs, or gallops,Tr edema  Respiratory:  clear to auscultation bilaterally, normal work of breathing GI: soft, nontender, nondistended, + BS  No hepatomegaly  MS: no deformity Moving all extremities   Skin: warm and dry, no rash Neuro:  Strength and sensation are intact Psych: euthymic mood, full affect   EKG:  EKG is not ordered today.  April 2016:   SR 71 bpm  LAFB  Poor R wave prgression.  T wave inversion III, AVF   Lipid Panel No results found for: CHOL, TRIG, HDL, CHOLHDL, VLDL, LDLCALC, LDLDIRECT    Wt Readings from Last 3 Encounters:  03/01/15 102.967 kg (227 lb)  08/18/14 100.245 kg (221 lb)  08/15/14 102.059 kg (225 lb)      ASSESSMENT AND PLAN:  1.  Chronic diastolic CHF  Volume appears mildly increased  Would not change meds for now.  Pt comfortable  Get labs here  COmpare to outside labs from [place] NA    2.  CAD  MInimal at cath    3  HTN  Good control  Told pt to elevate legs after taking diuretic in AM Continue to watch salt    She has appt set with R Tisovec soon    Signed, Dorris Carnes, MD  03/01/2015 10:37 AM    [address], [place], [place]   [address] Phone: [phone]; Fax: [phone]

## 2015-03-02 LAB — BRAIN NATRIURETIC PEPTIDE: Brain Natriuretic Peptide: 79.7 pg/mL (ref 0.0–100.0)

## 2015-03-15 DIAGNOSIS — R600 Localized edema: Secondary | ICD-10-CM | POA: Diagnosis not present

## 2015-03-15 DIAGNOSIS — R358 Other polyuria: Secondary | ICD-10-CM | POA: Diagnosis not present

## 2015-03-15 DIAGNOSIS — I10 Essential (primary) hypertension: Secondary | ICD-10-CM | POA: Diagnosis not present

## 2015-03-15 DIAGNOSIS — Z1389 Encounter for screening for other disorder: Secondary | ICD-10-CM | POA: Diagnosis not present

## 2015-03-15 DIAGNOSIS — E1165 Type 2 diabetes mellitus with hyperglycemia: Secondary | ICD-10-CM | POA: Diagnosis not present

## 2015-03-15 DIAGNOSIS — Z794 Long term (current) use of insulin: Secondary | ICD-10-CM | POA: Diagnosis not present

## 2015-03-15 DIAGNOSIS — E119 Type 2 diabetes mellitus without complications: Secondary | ICD-10-CM | POA: Diagnosis not present

## 2015-03-15 DIAGNOSIS — I5032 Chronic diastolic (congestive) heart failure: Secondary | ICD-10-CM | POA: Diagnosis not present

## 2015-03-15 DIAGNOSIS — Z23 Encounter for immunization: Secondary | ICD-10-CM | POA: Diagnosis not present

## 2015-03-15 DIAGNOSIS — E7800 Pure hypercholesterolemia, unspecified: Secondary | ICD-10-CM | POA: Diagnosis not present

## 2015-03-15 DIAGNOSIS — I872 Venous insufficiency (chronic) (peripheral): Secondary | ICD-10-CM | POA: Diagnosis not present

## 2015-03-15 DIAGNOSIS — Z6841 Body Mass Index (BMI) 40.0 and over, adult: Secondary | ICD-10-CM | POA: Diagnosis not present

## 2015-03-26 ENCOUNTER — Ambulatory Visit: Payer: Medicare Other | Admitting: Endocrinology

## 2015-04-10 DIAGNOSIS — E1165 Type 2 diabetes mellitus with hyperglycemia: Secondary | ICD-10-CM | POA: Diagnosis not present

## 2015-04-10 DIAGNOSIS — Z6841 Body Mass Index (BMI) 40.0 and over, adult: Secondary | ICD-10-CM | POA: Diagnosis not present

## 2015-04-10 DIAGNOSIS — N184 Chronic kidney disease, stage 4 (severe): Secondary | ICD-10-CM | POA: Diagnosis not present

## 2015-04-10 DIAGNOSIS — I129 Hypertensive chronic kidney disease with stage 1 through stage 4 chronic kidney disease, or unspecified chronic kidney disease: Secondary | ICD-10-CM | POA: Diagnosis not present

## 2015-04-25 DIAGNOSIS — I872 Venous insufficiency (chronic) (peripheral): Secondary | ICD-10-CM | POA: Diagnosis not present

## 2015-04-25 DIAGNOSIS — D631 Anemia in chronic kidney disease: Secondary | ICD-10-CM | POA: Diagnosis not present

## 2015-04-25 DIAGNOSIS — E119 Type 2 diabetes mellitus without complications: Secondary | ICD-10-CM | POA: Diagnosis not present

## 2015-04-25 DIAGNOSIS — E1165 Type 2 diabetes mellitus with hyperglycemia: Secondary | ICD-10-CM | POA: Diagnosis not present

## 2015-04-25 DIAGNOSIS — Z1389 Encounter for screening for other disorder: Secondary | ICD-10-CM | POA: Diagnosis not present

## 2015-04-25 DIAGNOSIS — N184 Chronic kidney disease, stage 4 (severe): Secondary | ICD-10-CM | POA: Diagnosis not present

## 2015-04-25 DIAGNOSIS — I5032 Chronic diastolic (congestive) heart failure: Secondary | ICD-10-CM | POA: Diagnosis not present

## 2015-04-25 DIAGNOSIS — Z6841 Body Mass Index (BMI) 40.0 and over, adult: Secondary | ICD-10-CM | POA: Diagnosis not present

## 2015-05-06 NOTE — Progress Notes (Signed)
This encounter was created in error - please disregard.

## 2015-05-07 ENCOUNTER — Encounter: Payer: Medicare Other | Admitting: Internal Medicine

## 2015-05-08 ENCOUNTER — Encounter: Payer: Self-pay | Admitting: Internal Medicine

## 2015-05-23 DIAGNOSIS — Z6841 Body Mass Index (BMI) 40.0 and over, adult: Secondary | ICD-10-CM | POA: Diagnosis not present

## 2015-05-23 DIAGNOSIS — R3981 Functional urinary incontinence: Secondary | ICD-10-CM | POA: Diagnosis not present

## 2015-05-23 DIAGNOSIS — I872 Venous insufficiency (chronic) (peripheral): Secondary | ICD-10-CM | POA: Diagnosis not present

## 2015-05-23 DIAGNOSIS — D638 Anemia in other chronic diseases classified elsewhere: Secondary | ICD-10-CM | POA: Diagnosis not present

## 2015-05-23 DIAGNOSIS — R0600 Dyspnea, unspecified: Secondary | ICD-10-CM | POA: Diagnosis not present

## 2015-05-23 DIAGNOSIS — R296 Repeated falls: Secondary | ICD-10-CM | POA: Diagnosis not present

## 2015-05-23 DIAGNOSIS — I5032 Chronic diastolic (congestive) heart failure: Secondary | ICD-10-CM | POA: Diagnosis not present

## 2015-06-11 DIAGNOSIS — E118 Type 2 diabetes mellitus with unspecified complications: Secondary | ICD-10-CM | POA: Diagnosis not present

## 2015-06-11 DIAGNOSIS — R8290 Unspecified abnormal findings in urine: Secondary | ICD-10-CM | POA: Diagnosis not present

## 2015-06-11 DIAGNOSIS — R6883 Chills (without fever): Secondary | ICD-10-CM | POA: Diagnosis not present

## 2015-06-11 DIAGNOSIS — R197 Diarrhea, unspecified: Secondary | ICD-10-CM | POA: Diagnosis not present

## 2015-06-11 DIAGNOSIS — R61 Generalized hyperhidrosis: Secondary | ICD-10-CM | POA: Diagnosis not present

## 2015-06-15 DIAGNOSIS — R42 Dizziness and giddiness: Secondary | ICD-10-CM | POA: Diagnosis not present

## 2015-06-15 DIAGNOSIS — E119 Type 2 diabetes mellitus without complications: Secondary | ICD-10-CM | POA: Diagnosis not present

## 2015-06-15 DIAGNOSIS — N390 Urinary tract infection, site not specified: Secondary | ICD-10-CM | POA: Diagnosis not present

## 2015-06-17 NOTE — Progress Notes (Signed)
Cardiology Office Note   Date:  06/19/2015   ID:  KRISTLYN Melton, DOB 1930/06/10, MRN [id number]  PCP:  Pcp Not In System  Cardiologist:   Dorris Carnes, MD   F/U of diastolic CHF    History of Present Illness: Brandi Melton is a 82 y.o. female with a history of  DM, HTN, HL, OSA and CHF Just moved her from [facility] a couple days agoe Cath in Feb 2015 LVEF 65 to 70% Minimal CAD of LAD, RCA LCx dominant Elvated R and L filling pressures  Echo in 2014 LVEF 65% [place] LVH Gr I diastolic CHF (A999333) She was hosp earlier this fall with volume overload Most likely occurred because she stopped taking lasix regularly She has been taking since d/c  I saw her in clinic in November 2016 She got back from [facility] a couple days ago  Got sick while there Treated for URI Complains of continued SOB  Also complains of LE edema  Wants to see someone about venous insufficiency Denies CP    Outpatient Prescriptions Prior to Visit  Medication Sig Dispense Refill  . acetaminophen (TYLENOL) 500 MG tablet Take 500 mg by mouth every 6 (six) hours as needed (pain).    . carvedilol (COREG CR) 80 MG 24 hr capsule Take 80 mg by mouth daily.    . chlorhexidine (PERIDEX) 0.12 % solution Use as directed 5 mLs in the mouth or throat 2 (two) times daily.     Marland Kitchen ezetimibe-simvastatin (VYTORIN) 10-40 MG per tablet Take 1 tablet by mouth at bedtime.    . folic acid (FOLVITE) Q000111Q MCG tablet Take 400 mcg by mouth daily.    . furosemide (LASIX) 80 MG tablet Take 80 mg by mouth daily.     . insulin aspart (NOVOLOG) 100 UNIT/ML FlexPen Inject 7-10 Units into the skin 3 (three) times daily with meals. Per sliding scale    . Insulin Detemir (LEVEMIR) 100 UNIT/ML Pen Inject into the skin daily at 10 pm. Dose varies based on CBG (FlexTouch Pen)    . magnesium oxide (MAG-OX) 400 MG tablet Take 400 mg by mouth daily.    . metFORMIN (GLUCOPHAGE) 500 MG tablet Take 500 mg by mouth 2 (two) times daily.  11  . oxyCODONE (OXY  IR/ROXICODONE) 5 MG immediate release tablet Take 1 tablet (5 mg total) by mouth every 6 (six) hours as needed for moderate pain. 30 tablet 0  . potassium chloride SA (K-DUR,KLOR-CON) 20 MEQ tablet Take 20 mEq by mouth daily.    . potassium gluconate 595 MG TABS tablet Take 595 mg by mouth daily.    Marland Kitchen spironolactone (ALDACTONE) 25 MG tablet TK 1 T PO QD IN THE MORNING  2  . telmisartan (MICARDIS) 40 MG tablet Take 40 mg by mouth daily. Micardis    . Vitamin D, Ergocalciferol, (DRISDOL) 50000 UNITS CAPS capsule Take 50,000 Units by mouth every 7 (seven) days. On Thursdays    . amLODipine (NORVASC) 5 MG tablet Take 2.5 mg by mouth daily.   3  . cholecalciferol (VITAMIN D) 1000 UNITS tablet Take 3,000 Units by mouth daily. Reported on 06/18/2015    . Dulaglutide 0.75 MG/0.5ML SOPN Inject 0.75 mg into the skin once a week. Reported on 06/18/2015    . linagliptin (TRADJENTA) 5 MG TABS tablet Take 5 mg by mouth daily. Reported on 06/18/2015    . niacin (NIASPAN) 500 MG CR tablet Take 500 mg by mouth 2 (two) times daily. Reported  on 06/18/2015     No facility-administered medications prior to visit.     Allergies:   Review of patient's allergies indicates no known allergies.   Past Medical History  Diagnosis Date  . Diabetes mellitus without complication ([place])   . Hypertension   . Hyperlipemia   . Depression   . Myocardial infarction [facility])     Maybe a small one- unaware when  . Shortness of breath dyspnea     with exertion  . CHF (congestive heart failure) ([place])   . Sleep apnea   . Palsy, Bell's   . RA (rheumatoid arthritis) [facility])     Past Surgical History  Procedure Laterality Date  . Joint replacement Bilateral     Hips  . Knee arthroplasty Left   . Cholecystectomy    . Cardiac catheterization  2013ish  . Colonoscopy    . Wrist fracture surgery Right 08/08/2014  . Orif wrist fracture Right 08/08/2014    Procedure: Right OPEN REDUCTION INTERNAL FIXATION (ORIF) WRIST FRACTURE WITH REPAIR  AND RECONST/ALLOGRAFT AND BONE GRAFT;  Surgeon: Roseanne Kaufman, MD;  Location: [place];  Service: Orthopedics;  Laterality: Right;  . Carpal tunnel release Right 08/08/2014    Procedure: OPEN CARPAL TUNNEL RELEASE;  Surgeon: Roseanne Kaufman, MD;  Location: [place];  Service: Orthopedics;  Laterality: Right;     Social History:  The patient  reports that she has never smoked. She has never used smokeless tobacco. She reports that she does not drink alcohol or use illicit drugs.   Family History:  The patient's family history is not on file.    ROS:  Please see the history of present illness. All other systems are reviewed and  Negative to the above problem except as noted.    PHYSICAL EXAM: VS:  BP 98/66 mmHg  Pulse 60  Ht 5\' 3"  (1.6 m)  Wt 239 lb 12.8 oz (108.773 kg)  BMI 42.49 kg/m2  GEN: Well nourished, well developed, in no acute distress HEENT: normal Neck: no JVD, carotid bruits, or masses Cardiac: RRR; no murmurs, rubs, or gallops, 1-2+ edema into thigh Respiratory: Mild upper airway wheeze  Moving air in all fields   GI: soft, nontender, nondistended, + BS  No hepatomegaly  MS: no deformity Moving all extremities   Skin: warm and dry, no rash Neuro:  Strength and sensation are intact Psych: euthymic mood, full affect   EKG:  EKG is ordered today.  SB 58 bpm  LVH     Lipid Panel No results found for: CHOL, TRIG, HDL, CHOLHDL, VLDL, LDLCALC, LDLDIRECT    Wt Readings from Last 3 Encounters:  06/18/15 239 lb 12.8 oz (108.773 kg)  03/01/15 227 lb (102.967 kg)  08/18/14 221 lb (100.245 kg)      ASSESSMENT AND PLAN:  1  Edema  Pt appears to be signif volume overloaded   WIll check electrolytes today  Check BNP   Redose lasix based on above   Pt needs to discuss Na content of diet at [place]  Pt seems very depressed Tearful  Questions meaning of things. Old.  Does not appear to like living at [place] I encouraged her to talk to family "They are too busy"  Son  here.  Daughter in [place].  Encouraged her to consider other living accomodations ([place])  3  HTN  Stop amlodipine  Will need to follow BP  May help with edema      Current medicines are reviewed at length  with the patient today.  The patient does not have concerns regarding medicines.  The following changes have been made:   Labs/ tests ordered today include:  Orders Placed This Encounter  Procedures  . Basic metabolic panel  . Brain natriuretic peptide     Disposition:   FU with  in   Signed, Dorris Carnes, MD  06/19/2015 7:42 AM    [address], [place], [place]  [address] Phone: [phone]; Fax: [phone]

## 2015-06-18 ENCOUNTER — Ambulatory Visit (INDEPENDENT_AMBULATORY_CARE_PROVIDER_SITE_OTHER): Payer: Medicare Other | Admitting: Internal Medicine

## 2015-06-18 ENCOUNTER — Encounter: Payer: Self-pay | Admitting: Internal Medicine

## 2015-06-18 VITALS — BP 98/66 | HR 60 | Ht 63.0 in | Wt 239.8 lb

## 2015-06-18 DIAGNOSIS — R609 Edema, unspecified: Secondary | ICD-10-CM

## 2015-06-18 DIAGNOSIS — I509 Heart failure, unspecified: Secondary | ICD-10-CM | POA: Diagnosis not present

## 2015-06-18 DIAGNOSIS — I10 Essential (primary) hypertension: Secondary | ICD-10-CM | POA: Diagnosis not present

## 2015-06-18 LAB — BASIC METABOLIC PANEL
BUN: 50 mg/dL — ABNORMAL HIGH (ref 7–25)
CALCIUM: 9.5 mg/dL (ref 8.6–10.4)
CO2: 29 mmol/L (ref 20–31)
Chloride: 102 mmol/L (ref 98–110)
Creat: 1.61 mg/dL — ABNORMAL HIGH (ref 0.60–0.88)
GLUCOSE: 102 mg/dL — AB (ref 65–99)
Potassium: 4.9 mmol/L (ref 3.5–5.3)
SODIUM: 141 mmol/L (ref 135–146)

## 2015-06-18 LAB — BRAIN NATRIURETIC PEPTIDE: Brain Natriuretic Peptide: 106.8 pg/mL — ABNORMAL HIGH (ref <100)

## 2015-06-18 NOTE — Patient Instructions (Signed)
Your physician has recommended you make the following change in your medication:  1.) STOP AMLODIPINE  Your physician recommends that you return for lab work in: [place] (BMET, BNP)

## 2015-06-19 ENCOUNTER — Telehealth: Payer: Self-pay

## 2015-06-19 DIAGNOSIS — Z79899 Other long term (current) drug therapy: Secondary | ICD-10-CM

## 2015-06-19 MED ORDER — METOLAZONE 2.5 MG PO TABS: ORAL_TABLET | ORAL | Status: DC

## 2015-06-19 MED ORDER — TELMISARTAN 20 MG PO TABS: 20.0000 mg | ORAL_TABLET | Freq: Every day | ORAL | Status: DC

## 2015-06-19 NOTE — Telephone Encounter (Signed)
Left message to call back to make lab appointment for Monday. Lab work already ordered.

## 2015-06-19 NOTE — Telephone Encounter (Signed)
-----   Message from Fay Records, MD sent at 06/19/2015  1:46 PM EST ----- SPoke to patient Cr higher than previous   Seen yesterday in clinic  Significant peripheral edema on exam.(pitting to thigh) Needsd increased diuresis Reviewed meds  She was not on amlodipine Recomm:  Cut micardis in 1/2 (take 20) Stop spironolactone Try Zaroxyln 2.5 mg 30 min prior to lasix  MWF Will need CMET and TSH on Monday. Pt would like called into [facility]  They will deliver to her  Please confirm (pt lives in [place])

## 2015-06-20 DIAGNOSIS — E1129 Type 2 diabetes mellitus with other diabetic kidney complication: Secondary | ICD-10-CM | POA: Diagnosis not present

## 2015-06-20 DIAGNOSIS — I129 Hypertensive chronic kidney disease with stage 1 through stage 4 chronic kidney disease, or unspecified chronic kidney disease: Secondary | ICD-10-CM | POA: Diagnosis not present

## 2015-06-20 DIAGNOSIS — N184 Chronic kidney disease, stage 4 (severe): Secondary | ICD-10-CM | POA: Diagnosis not present

## 2015-06-20 DIAGNOSIS — D631 Anemia in chronic kidney disease: Secondary | ICD-10-CM | POA: Diagnosis not present

## 2015-06-20 DIAGNOSIS — I5032 Chronic diastolic (congestive) heart failure: Secondary | ICD-10-CM | POA: Diagnosis not present

## 2015-06-20 DIAGNOSIS — Z1389 Encounter for screening for other disorder: Secondary | ICD-10-CM | POA: Diagnosis not present

## 2015-06-20 DIAGNOSIS — I10 Essential (primary) hypertension: Secondary | ICD-10-CM | POA: Diagnosis not present

## 2015-06-20 DIAGNOSIS — E7800 Pure hypercholesterolemia, unspecified: Secondary | ICD-10-CM | POA: Diagnosis not present

## 2015-06-20 DIAGNOSIS — Z794 Long term (current) use of insulin: Secondary | ICD-10-CM | POA: Diagnosis not present

## 2015-06-20 DIAGNOSIS — E1151 Type 2 diabetes mellitus with diabetic peripheral angiopathy without gangrene: Secondary | ICD-10-CM | POA: Diagnosis not present

## 2015-06-20 DIAGNOSIS — Z6841 Body Mass Index (BMI) 40.0 and over, adult: Secondary | ICD-10-CM | POA: Diagnosis not present

## 2015-06-21 NOTE — Telephone Encounter (Signed)
Left message to call back to schedule lab appointment for [facility] 3/13.

## 2015-06-22 NOTE — Telephone Encounter (Signed)
Patient called and made lab appointment this am

## 2015-06-25 ENCOUNTER — Other Ambulatory Visit: Payer: Self-pay

## 2015-06-25 ENCOUNTER — Telehealth: Payer: Self-pay | Admitting: Internal Medicine

## 2015-06-25 ENCOUNTER — Other Ambulatory Visit (INDEPENDENT_AMBULATORY_CARE_PROVIDER_SITE_OTHER): Payer: Medicare Other | Admitting: *Deleted

## 2015-06-25 DIAGNOSIS — Z79899 Other long term (current) drug therapy: Secondary | ICD-10-CM | POA: Diagnosis not present

## 2015-06-25 LAB — COMPREHENSIVE METABOLIC PANEL
ALBUMIN: 3.5 g/dL — AB (ref 3.6–5.1)
ALT: 21 U/L (ref 6–29)
AST: 21 U/L (ref 10–35)
Alkaline Phosphatase: 67 U/L (ref 33–130)
BILIRUBIN TOTAL: 0.6 mg/dL (ref 0.2–1.2)
BUN: 50 mg/dL — AB (ref 7–25)
CO2: 24 mmol/L (ref 20–31)
CREATININE: 1.52 mg/dL — AB (ref 0.60–0.88)
Calcium: 8.8 mg/dL (ref 8.6–10.4)
Chloride: 101 mmol/L (ref 98–110)
Glucose, Bld: 132 mg/dL — ABNORMAL HIGH (ref 65–99)
Potassium: 4.5 mmol/L (ref 3.5–5.3)
SODIUM: 138 mmol/L (ref 135–146)
TOTAL PROTEIN: 6.3 g/dL (ref 6.1–8.1)

## 2015-06-25 LAB — TSH: TSH: 2.06 mIU/L

## 2015-06-25 MED ORDER — CARVEDILOL PHOSPHATE ER 80 MG PO CP24: 80.0000 mg | ORAL_CAPSULE | Freq: Every day | ORAL | Status: DC

## 2015-06-25 NOTE — Telephone Encounter (Signed)
Walk in pt form-pt needs another prescription for Walker-[place]ene back Wednesday.

## 2015-06-27 ENCOUNTER — Other Ambulatory Visit: Payer: Self-pay | Admitting: *Deleted

## 2015-06-27 ENCOUNTER — Telehealth: Payer: Self-pay | Admitting: *Deleted

## 2015-06-27 MED ORDER — TELMISARTAN 40 MG PO TABS: 20.0000 mg | ORAL_TABLET | Freq: Every day | ORAL | Status: DC

## 2015-06-27 NOTE — Telephone Encounter (Signed)
-----   Message from Fay Records, MD sent at 06/26/2015  2:31 PM EDT ----- Thyroid function is normal Kidney function is stable  ? With Zaroxylyn has she noticed an improvement in edema.??

## 2015-06-27 NOTE — Telephone Encounter (Signed)
Reviewed results with patient.  Pt states she wants Micardis sent to [facility], 90 day supply. Also, a form was dropped off that requesting a prescription for a 4 wheel wheeled walker with a seat because hers is "falling apart". She would like the scripted mailed to her once completed if Dr. Harrington Challenger is able to write for it.

## 2015-06-28 NOTE — Addendum Note (Signed)
Addended by: Freada Bergeron on: 06/28/2015 03:37 PM   Modules accepted: Orders

## 2015-07-02 ENCOUNTER — Telehealth: Payer: Self-pay | Admitting: Internal Medicine

## 2015-07-02 ENCOUNTER — Inpatient Hospital Stay (HOSPITAL_COMMUNITY)
Admission: AD | Admit: 2015-07-02 | Discharge: 2015-07-05 | DRG: 292 | Disposition: A | Discharge status: Home / Self Care | Payer: Medicare Other | Source: Ambulatory Visit | Attending: Internal Medicine | Admitting: Internal Medicine

## 2015-07-02 DIAGNOSIS — R609 Edema, unspecified: Secondary | ICD-10-CM

## 2015-07-02 DIAGNOSIS — G510 Bell's palsy: Secondary | ICD-10-CM | POA: Diagnosis present

## 2015-07-02 DIAGNOSIS — N179 Acute kidney failure, unspecified: Secondary | ICD-10-CM | POA: Diagnosis present

## 2015-07-02 DIAGNOSIS — Z6841 Body Mass Index (BMI) 40.0 and over, adult: Secondary | ICD-10-CM

## 2015-07-02 DIAGNOSIS — R0602 Shortness of breath: Secondary | ICD-10-CM | POA: Diagnosis not present

## 2015-07-02 DIAGNOSIS — N183 Chronic kidney disease, stage 3 (moderate): Secondary | ICD-10-CM

## 2015-07-02 DIAGNOSIS — I509 Heart failure, unspecified: Secondary | ICD-10-CM | POA: Diagnosis not present

## 2015-07-02 DIAGNOSIS — I110 Hypertensive heart disease with heart failure: Secondary | ICD-10-CM | POA: Diagnosis present

## 2015-07-02 DIAGNOSIS — Z794 Long term (current) use of insulin: Secondary | ICD-10-CM | POA: Diagnosis not present

## 2015-07-02 DIAGNOSIS — G4730 Sleep apnea, unspecified: Secondary | ICD-10-CM | POA: Diagnosis present

## 2015-07-02 DIAGNOSIS — E119 Type 2 diabetes mellitus without complications: Secondary | ICD-10-CM | POA: Diagnosis present

## 2015-07-02 DIAGNOSIS — Z7982 Long term (current) use of aspirin: Secondary | ICD-10-CM

## 2015-07-02 DIAGNOSIS — I5033 Acute on chronic diastolic (congestive) heart failure: Secondary | ICD-10-CM | POA: Diagnosis not present

## 2015-07-02 DIAGNOSIS — I2510 Atherosclerotic heart disease of native coronary artery without angina pectoris: Secondary | ICD-10-CM | POA: Diagnosis present

## 2015-07-02 DIAGNOSIS — E785 Hyperlipidemia, unspecified: Secondary | ICD-10-CM | POA: Diagnosis not present

## 2015-07-02 DIAGNOSIS — I10 Essential (primary) hypertension: Secondary | ICD-10-CM | POA: Diagnosis not present

## 2015-07-02 DIAGNOSIS — M069 Rheumatoid arthritis, unspecified: Secondary | ICD-10-CM | POA: Diagnosis present

## 2015-07-02 DIAGNOSIS — Z9114 Patient's other noncompliance with medication regimen: Secondary | ICD-10-CM | POA: Diagnosis not present

## 2015-07-02 DIAGNOSIS — Z79899 Other long term (current) drug therapy: Secondary | ICD-10-CM

## 2015-07-02 LAB — COMPREHENSIVE METABOLIC PANEL
ALK PHOS: 67 U/L (ref 38–126)
ALT: 25 U/L (ref 14–54)
AST: 26 U/L (ref 15–41)
Albumin: 3.4 g/dL — ABNORMAL LOW (ref 3.5–5.0)
Anion gap: 15 (ref 5–15)
BILIRUBIN TOTAL: 0.5 mg/dL (ref 0.3–1.2)
BUN: 38 mg/dL — AB (ref 6–20)
CALCIUM: 9.8 mg/dL (ref 8.9–10.3)
CHLORIDE: 101 mmol/L (ref 101–111)
CO2: 29 mmol/L (ref 22–32)
CREATININE: 1.64 mg/dL — AB (ref 0.44–1.00)
GFR calc Af Amer: 33 mL/min — ABNORMAL LOW (ref >60)
GFR, EST NON AFRICAN AMERICAN: 28 mL/min — AB (ref >60)
Glucose, Bld: 133 mg/dL — ABNORMAL HIGH (ref 65–99)
Potassium: 4.4 mmol/L (ref 3.5–5.1)
Sodium: 145 mmol/L (ref 135–145)
TOTAL PROTEIN: 6.5 g/dL (ref 6.5–8.1)

## 2015-07-02 LAB — CBC WITH DIFFERENTIAL/PLATELET
Basophils Absolute: 0 10*3/uL (ref 0.0–0.1)
Basophils Relative: 0 %
EOS ABS: 0.6 10*3/uL (ref 0.0–0.7)
EOS PCT: 6 %
HCT: 36.9 % (ref 36.0–46.0)
Hemoglobin: 11.3 g/dL — ABNORMAL LOW (ref 12.0–15.0)
LYMPHS PCT: 22 %
Lymphs Abs: 2.1 10*3/uL (ref 0.7–4.0)
MCH: 30.2 pg (ref 26.0–34.0)
MCHC: 30.6 g/dL (ref 30.0–36.0)
MCV: 98.7 fL (ref 78.0–100.0)
MONO ABS: 1.2 10*3/uL — AB (ref 0.1–1.0)
Monocytes Relative: 12 %
Neutro Abs: 5.7 10*3/uL (ref 1.7–7.7)
Neutrophils Relative %: 60 %
PLATELETS: 196 10*3/uL (ref 150–400)
RBC: 3.74 MIL/uL — AB (ref 3.87–5.11)
RDW: 14.3 % (ref 11.5–15.5)
WBC: 9.5 10*3/uL (ref 4.0–10.5)

## 2015-07-02 LAB — GLUCOSE, CAPILLARY
GLUCOSE-CAPILLARY: 134 mg/dL — AB (ref 65–99)
GLUCOSE-CAPILLARY: 236 mg/dL — AB (ref 65–99)
Glucose-Capillary: 188 mg/dL — ABNORMAL HIGH (ref 65–99)

## 2015-07-02 LAB — TSH: TSH: 2.212 u[IU]/mL (ref 0.350–4.500)

## 2015-07-02 MED ORDER — ACETAMINOPHEN 500 MG PO TABS: 500.0000 mg | ORAL_TABLET | Freq: Four times a day (QID) | ORAL | Status: DC | PRN

## 2015-07-02 MED ORDER — ONDANSETRON HCL 4 MG/2ML IJ SOLN: 4.0000 mg | Freq: Four times a day (QID) | INTRAMUSCULAR | Status: DC | PRN

## 2015-07-02 MED ORDER — SODIUM CHLORIDE 0.9% FLUSH
3.0000 mL | Freq: Two times a day (BID) | INTRAVENOUS | Status: DC
  Administered 2015-07-02 – 2015-07-05 (×7): 3 mL via INTRAVENOUS

## 2015-07-02 MED ORDER — VITAMIN D (ERGOCALCIFEROL) 1.25 MG (50000 UNIT) PO CAPS: 50000.0000 [IU] | ORAL_CAPSULE | ORAL | Status: DC

## 2015-07-02 MED ORDER — INSULIN ASPART 100 UNIT/ML FLEXPEN: 7.0000 [IU] | PEN_INJECTOR | Freq: Three times a day (TID) | SUBCUTANEOUS | Status: DC

## 2015-07-02 MED ORDER — IRBESARTAN 75 MG PO TABS
37.5000 mg | ORAL_TABLET | Freq: Every day | ORAL | Status: DC
  Administered 2015-07-03: 37.5 mg via ORAL
  Filled 2015-07-02 (×2): qty 0.5

## 2015-07-02 MED ORDER — INSULIN DETEMIR 100 UNIT/ML ~~LOC~~ SOLN
80.0000 [IU] | Freq: Once | SUBCUTANEOUS | Status: AC
  Administered 2015-07-02: 80 [IU] via SUBCUTANEOUS
  Filled 2015-07-02 (×2): qty 0.8

## 2015-07-02 MED ORDER — MAGNESIUM OXIDE 400 (241.3 MG) MG PO TABS
400.0000 mg | ORAL_TABLET | Freq: Every day | ORAL | Status: DC
  Administered 2015-07-03 – 2015-07-05 (×3): 400 mg via ORAL
  Filled 2015-07-02 (×3): qty 1

## 2015-07-02 MED ORDER — FOLIC ACID 0.5 MG HALF TAB
400.0000 ug | ORAL_TABLET | Freq: Every day | ORAL | Status: DC
  Filled 2015-07-02: qty 1

## 2015-07-02 MED ORDER — FUROSEMIDE 10 MG/ML IJ SOLN
80.0000 mg | Freq: Once | INTRAMUSCULAR | Status: AC
Start: 2015-07-02 — End: 2015-07-02
  Administered 2015-07-02: 80 mg via INTRAVENOUS
  Filled 2015-07-02: qty 8

## 2015-07-02 MED ORDER — SODIUM CHLORIDE 0.9% FLUSH: 3.0000 mL | INTRAVENOUS | Status: DC | PRN

## 2015-07-02 MED ORDER — ASPIRIN EC 81 MG PO TBEC
81.0000 mg | DELAYED_RELEASE_TABLET | Freq: Every day | ORAL | Status: DC
  Administered 2015-07-03 – 2015-07-05 (×3): 81 mg via ORAL
  Filled 2015-07-02 (×3): qty 1

## 2015-07-02 MED ORDER — HEPARIN SODIUM (PORCINE) 5000 UNIT/ML IJ SOLN
5000.0000 [IU] | Freq: Three times a day (TID) | INTRAMUSCULAR | Status: DC
  Administered 2015-07-02 – 2015-07-05 (×9): 5000 [IU] via SUBCUTANEOUS
  Filled 2015-07-02 (×4): qty 1

## 2015-07-02 MED ORDER — INSULIN ASPART 100 UNIT/ML ~~LOC~~ SOLN
0.0000 [IU] | Freq: Every day | SUBCUTANEOUS | Status: DC
  Administered 2015-07-02 – 2015-07-03 (×2): 2 [IU] via SUBCUTANEOUS

## 2015-07-02 MED ORDER — OXYCODONE HCL 5 MG PO TABS
5.0000 mg | ORAL_TABLET | Freq: Four times a day (QID) | ORAL | Status: DC | PRN
  Administered 2015-07-02: 5 mg via ORAL
  Filled 2015-07-02: qty 1

## 2015-07-02 MED ORDER — CHLORHEXIDINE GLUCONATE 0.12 % MT SOLN
5.0000 mL | Freq: Two times a day (BID) | OROMUCOSAL | Status: DC
  Administered 2015-07-02 – 2015-07-05 (×6): 5 mL via OROMUCOSAL
  Filled 2015-07-02 (×6): qty 15

## 2015-07-02 MED ORDER — SODIUM CHLORIDE 0.9 % IV SOLN: 250.0000 mL | INTRAVENOUS | Status: DC | PRN

## 2015-07-02 MED ORDER — CARVEDILOL PHOSPHATE ER 80 MG PO CP24
80.0000 mg | ORAL_CAPSULE | Freq: Every day | ORAL | Status: DC
  Administered 2015-07-03 – 2015-07-05 (×3): 80 mg via ORAL
  Filled 2015-07-02 (×6): qty 1

## 2015-07-02 MED ORDER — ACETAMINOPHEN 325 MG PO TABS
650.0000 mg | ORAL_TABLET | ORAL | Status: DC | PRN
  Administered 2015-07-03: 650 mg via ORAL
  Filled 2015-07-02: qty 2

## 2015-07-02 MED ORDER — METFORMIN HCL 500 MG PO TABS: 500.0000 mg | ORAL_TABLET | Freq: Two times a day (BID) | ORAL | Status: DC

## 2015-07-02 MED ORDER — INSULIN ASPART 100 UNIT/ML ~~LOC~~ SOLN
0.0000 [IU] | Freq: Three times a day (TID) | SUBCUTANEOUS | Status: DC
  Administered 2015-07-03: 3 [IU] via SUBCUTANEOUS
  Administered 2015-07-03: 1 [IU] via SUBCUTANEOUS
  Administered 2015-07-03: 2 [IU] via SUBCUTANEOUS
  Administered 2015-07-04: 7 [IU] via SUBCUTANEOUS
  Administered 2015-07-04: 2 [IU] via SUBCUTANEOUS

## 2015-07-02 MED ORDER — VITAMIN D (ERGOCALCIFEROL) 1.25 MG (50000 UNIT) PO CAPS
50000.0000 [IU] | ORAL_CAPSULE | ORAL | Status: DC
  Administered 2015-07-04: 50000 [IU] via ORAL
  Filled 2015-07-02 (×2): qty 1

## 2015-07-02 MED ORDER — EZETIMIBE-SIMVASTATIN 10-40 MG PO TABS
1.0000 | ORAL_TABLET | Freq: Every day | ORAL | Status: DC
  Administered 2015-07-03 – 2015-07-04 (×2): 1 via ORAL
  Filled 2015-07-02 (×3): qty 1

## 2015-07-02 NOTE — Telephone Encounter (Signed)
New Message:;    Please call today asap,she wants to know when she is going to be admitted to the hospital.

## 2015-07-02 NOTE — H&P (Signed)
Primary Physician: Primary Cardiologist:  Harrington Challenger  HPI:Pt presents for diuresis  For acute on chronic diastolic CHF that failed outpt diuresis  Pt is an 82 yo who I saw for the first time in November 2016  She has a history of DM, HTN, OST and diastolic CHF   Cath in [facility] in Feb 2015 showed LVEF 65 to 70% with minimal CAD of LAD, RCA  R and L filling pressure elevated at time  She was hosp last fall in [place]  Volume overload felt to be due to her noncompliance with meds   I saw her in clinic on 06/18/15  At that time she had marked edema with 1- 2+ edema to thigh She was very SOB  I recomm she continue on lasix  Added 2.5 Zaroxyln to regimen MWF    Stopped aldactone  Cut micardis in 1/2 to 20 mg She began taking and came back for labs  Said that she continued to be very SOB  Still had edema in legs  With renal insuff I recomm she come in to hosp for IV diuresis and close f/u of labs  She is still SOB  With minimal activity she stops  Daughter is with her and concurs   She says mid week last week she noticed some increased output with diuretic  Admits to eating [employer] frozen foods and hot dogs.           Past Medical History  Diagnosis Date  . Diabetes mellitus without complication ([place])   . Hypertension   . Hyperlipemia   . Depression   . Myocardial infarction [facility])     Maybe a small one- unaware when  . Shortness of breath dyspnea     with exertion  . CHF (congestive heart failure) ([place])   . Sleep apnea   . Palsy, Bell's   . RA (rheumatoid arthritis) ([facility])     Medications Prior to Admission  Medication Sig Dispense Refill  . aspirin EC 81 MG tablet Take 81 mg by mouth daily.    . calcium carbonate (TUMS EX) 750 MG chewable tablet Chew 1 tablet by mouth daily.    . carvedilol (COREG CR) 80 MG 24 hr capsule Take 1 capsule (80 mg total) by mouth daily. 90 capsule 3  . cholecalciferol (VITAMIN D) 1000 units tablet Take 3,000 Units by mouth daily.    Marland Kitchen ezetimibe-simvastatin  (VYTORIN) 10-40 MG per tablet Take 1 tablet by mouth daily.     . folic acid (FOLVITE) A999333 MCG tablet Take 400 mcg by mouth daily.    . furosemide (LASIX) 80 MG tablet Take 80 mg by mouth daily.     . insulin aspart (NOVOLOG) 100 UNIT/ML FlexPen Inject 20-25 Units into the skin 3 (three) times daily with meals. Inject 20 units subcutaneously with breakfast and lunch, inject 25 units with supper    . Insulin Detemir (LEVEMIR) 100 UNIT/ML Pen Inject 80 Units into the skin 2 (two) times daily.     . magnesium oxide (MAG-OX) 400 MG tablet Take 400 mg by mouth daily.    . metolazone (ZAROXOLYN) 2.5 MG tablet Take one tablet by mouth 30 minutes before taking Lasix on Monday, Wednesday, and Friday (Patient taking differently: Take 2.5 mg by mouth 3 (three) times a week. Take one tablet (2.5 mg) by mouth 30 minutes before taking Lasix on Monday, Wednesday, and Friday) 45 tablet 3  . telmisartan (MICARDIS) 40 MG tablet Take 0.5 tablets (20 mg total)  by mouth daily. 45 tablet 3  . Vitamin D, Ergocalciferol, (DRISDOL) 50000 UNITS CAPS capsule Take 50,000 Units by mouth every Wednesday.     . chlorhexidine (PERIDEX) 0.12 % solution Use as directed 5 mLs in the mouth or throat 2 (two) times daily.     Marland Kitchen oxyCODONE (OXY IR/ROXICODONE) 5 MG immediate release tablet Take 1 tablet (5 mg total) by mouth every 6 (six) hours as needed for moderate pain. (Patient not taking: Reported on 07/02/2015) 30 tablet 0  . potassium chloride SA (K-DUR,KLOR-CON) 20 MEQ tablet Take 20 mEq by mouth daily.       Marland Kitchen aspirin EC  81 mg Oral Daily  . carvedilol  80 mg Oral Daily  . chlorhexidine  5 mL Mouth/Throat BID  . [START ON 07/03/2015] ezetimibe-simvastatin  1 tablet Oral QHS  . folic acid  A999333 mcg Oral Daily  . heparin  5,000 Units Subcutaneous 3 times per day  . irbesartan  37.5 mg Oral Daily  . magnesium oxide  400 mg Oral Daily  . sodium chloride flush  3 mL Intravenous Q12H  . [START ON 07/04/2015] Vitamin D  (Ergocalciferol)  50,000 Units Oral Q7 days    Infusions:    No Known Allergies  Social History   Social History  . Marital Status: Widowed    Spouse Name: N/A  . Number of Children: N/A  . Years of Education: N/A   Occupational History  . Not on file.   Social History Main Topics  . Smoking status: Never Smoker   . Smokeless tobacco: Never Used  . Alcohol Use: No  . Drug Use: No  . Sexual Activity: Not on file   Other Topics Concern  . Not on file   Social History Narrative    No family history on file.  REVIEW OF SYSTEMS:  All systems reviewed  Negative to the above problem except as noted above.    PHYSICAL EXAM: Filed Vitals:   07/02/15 1124  BP: 129/45  Pulse: 67  Temp: 98.2 F (36.8 C)    No intake or output data in the 24 hours ending 07/02/15 1400  General:  PT is a morbidly obese 82 yo in NAD   HEENT: normal Neck: supple. Neck is full  Difficult to assess JVP  . Carotids 2+ bilat; no bruits. No lymphadenopathy or thryomegaly appreciated. Cor: PMI nondisplaced. Regular rate & rhythm. No rubs, gallops or murmurs. Lungs: Rales at R base   Abdomen: soft, nontender, nondistended. No hepatosplenomegaly. No bruits or masses. Good bowel sounds.  Obese Extremities: no cyanosis, clubbing, rash, 1+ edena above knee   Neuro: alert & oriented x 3, cranial nerves grossly intact. moves all 4 extremities w/o difficulty. Affect pleasant.  ECG:  Pending   Results for orders placed or performed during the hospital encounter of 07/02/15 (from the past 24 hour(s))  Glucose, capillary     Status: Abnormal   Collection Time: 07/02/15 11:29 AM  Result Value Ref Range   Glucose-Capillary 134 (H) 65 - 99 mg/dL  Comprehensive metabolic panel     Status: Abnormal   Collection Time: 07/02/15 12:17 PM  Result Value Ref Range   Sodium 145 135 - 145 mmol/L   Potassium 4.4 3.5 - 5.1 mmol/L   Chloride 101 101 - 111 mmol/L   CO2 29 22 - 32 mmol/L   Glucose, Bld 133 (H) 65  - 99 mg/dL   BUN 38 (H) 6 - 20 mg/dL   Creatinine, Ser  1.64 (H) 0.44 - 1.00 mg/dL   Calcium 9.8 8.9 - 10.3 mg/dL   Total Protein 6.5 6.5 - 8.1 g/dL   Albumin 3.4 (L) 3.5 - 5.0 g/dL   AST 26 15 - 41 U/L   ALT 25 14 - 54 U/L   Alkaline Phosphatase 67 38 - 126 U/L   Total Bilirubin 0.5 0.3 - 1.2 mg/dL   GFR calc non Af Amer 28 (L) >60 mL/min   GFR calc Af Amer 33 (L) >60 mL/min   Anion gap 15 5 - 15  CBC WITH DIFFERENTIAL     Status: Abnormal   Collection Time: 07/02/15 12:17 PM  Result Value Ref Range   WBC 9.5 4.0 - 10.5 K/uL   RBC 3.74 (L) 3.87 - 5.11 MIL/uL   Hemoglobin 11.3 (L) 12.0 - 15.0 g/dL   HCT 36.9 36.0 - 46.0 %   MCV 98.7 78.0 - 100.0 fL   MCH 30.2 26.0 - 34.0 pg   MCHC 30.6 30.0 - 36.0 g/dL   RDW 14.3 11.5 - 15.5 %   Platelets 196 150 - 400 K/uL   Neutrophils Relative % 60 %   Neutro Abs 5.7 1.7 - 7.7 K/uL   Lymphocytes Relative 22 %   Lymphs Abs 2.1 0.7 - 4.0 K/uL   Monocytes Relative 12 %   Monocytes Absolute 1.2 (H) 0.1 - 1.0 K/uL   Eosinophils Relative 6 %   Eosinophils Absolute 0.6 0.0 - 0.7 K/uL   Basophils Relative 0 %   Basophils Absolute 0.0 0.0 - 0.1 K/uL   No results found.   ASSESSMENT:  1  Acute on chronic diastolic CHF  Volume is still up on exam  Some better than when I saw her in clinic  But with renal insuff difficult to push further without more frequent labs I would reocmm IV lasix x 1 now  Strict I/O  May add Zaroxyln depending on response I would get echo here  Last one was done in [place]  Need to have images to review PT NEEDs DIETARY EDUCATION  Without it I think she will be back in same position in near future  2  CAD  Minimal at cath  3.  HTN  Follow  Continue ARB  4  DM  Pt on large doses of insulin  Admits to cheating some on carbs   Will Check Hgb A1C.  Dorris Carnes

## 2015-07-02 NOTE — Telephone Encounter (Signed)
Dr. Harrington Challenger has requested that patient be direct admitted to the hospital today for diuresis. Spoke with bed monitoring.  There is telemetry bed available on [address] [place] and informed patient they are holding a bed for her.  Explained to report to "admitting" in the [facility]. She verbalizes understanding and will report there today by 11:00 am.

## 2015-07-03 ENCOUNTER — Inpatient Hospital Stay (HOSPITAL_COMMUNITY): Payer: Medicare Other

## 2015-07-03 ENCOUNTER — Encounter (HOSPITAL_COMMUNITY): Payer: Self-pay | Admitting: *Deleted

## 2015-07-03 DIAGNOSIS — I509 Heart failure, unspecified: Secondary | ICD-10-CM

## 2015-07-03 LAB — BASIC METABOLIC PANEL
Anion gap: 15 (ref 5–15)
BUN: 41 mg/dL — AB (ref 6–20)
CALCIUM: 9.3 mg/dL (ref 8.9–10.3)
CO2: 29 mmol/L (ref 22–32)
Chloride: 97 mmol/L — ABNORMAL LOW (ref 101–111)
Creatinine, Ser: 1.67 mg/dL — ABNORMAL HIGH (ref 0.44–1.00)
GFR calc Af Amer: 32 mL/min — ABNORMAL LOW (ref >60)
GFR, EST NON AFRICAN AMERICAN: 27 mL/min — AB (ref >60)
GLUCOSE: 138 mg/dL — AB (ref 65–99)
Potassium: 3.8 mmol/L (ref 3.5–5.1)
Sodium: 141 mmol/L (ref 135–145)

## 2015-07-03 LAB — ECHOCARDIOGRAM COMPLETE
HEIGHTINCHES: 63 in
Weight: 3763.2 oz

## 2015-07-03 LAB — GLUCOSE, CAPILLARY
GLUCOSE-CAPILLARY: 246 mg/dL — AB (ref 65–99)
Glucose-Capillary: 128 mg/dL — ABNORMAL HIGH (ref 65–99)
Glucose-Capillary: 198 mg/dL — ABNORMAL HIGH (ref 65–99)
Glucose-Capillary: 246 mg/dL — ABNORMAL HIGH (ref 65–99)

## 2015-07-03 LAB — HEMOGLOBIN A1C
HEMOGLOBIN A1C: 8.5 % — AB (ref 4.8–5.6)
MEAN PLASMA GLUCOSE: 197 mg/dL

## 2015-07-03 MED ORDER — FUROSEMIDE 10 MG/ML IJ SOLN
80.0000 mg | Freq: Two times a day (BID) | INTRAMUSCULAR | Status: DC
  Administered 2015-07-03 (×2): 80 mg via INTRAVENOUS
  Filled 2015-07-03 (×2): qty 8

## 2015-07-03 MED ORDER — POTASSIUM CHLORIDE CRYS ER 20 MEQ PO TBCR
20.0000 meq | EXTENDED_RELEASE_TABLET | Freq: Two times a day (BID) | ORAL | Status: DC
  Administered 2015-07-03: 20 meq via ORAL
  Filled 2015-07-03: qty 1

## 2015-07-03 MED ORDER — FOLIC ACID 1 MG PO TABS
1.0000 mg | ORAL_TABLET | Freq: Every day | ORAL | Status: DC
  Administered 2015-07-03 – 2015-07-05 (×3): 1 mg via ORAL
  Filled 2015-07-03 (×3): qty 1

## 2015-07-03 MED ORDER — INSULIN DETEMIR 100 UNIT/ML FLEXPEN: 20.0000 [IU] | PEN_INJECTOR | Freq: Two times a day (BID) | SUBCUTANEOUS | Status: DC

## 2015-07-03 MED ORDER — INSULIN DETEMIR 100 UNIT/ML ~~LOC~~ SOLN
20.0000 [IU] | Freq: Two times a day (BID) | SUBCUTANEOUS | Status: DC
Start: 2015-07-03 — End: 2015-07-04
  Administered 2015-07-03 – 2015-07-04 (×2): 20 [IU] via SUBCUTANEOUS
  Filled 2015-07-03 (×4): qty 0.2

## 2015-07-03 NOTE — Progress Notes (Signed)
Echocardiogram 2D Echocardiogram has been performed.  Brandi Melton 07/03/2015, 3:11 PM

## 2015-07-03 NOTE — Plan of Care (Signed)
Problem: Food- and Nutrition-Related Knowledge Deficit (NB-1.1) Goal: Nutrition education Formal process to instruct or train a patient/client in a skill or to impart knowledge to help patients/clients voluntarily manage or modify food choices and eating behavior to maintain or improve health. Outcome: Completed/Met Date Met:  07/03/15 Nutrition Education Note  RD consulted for nutrition education regarding low sodium diet/CHF.  RD provided "Low Sodium Nutrition Therapy" and "Sodium (salt) Contents in Foods" handouts from the Academy of [employer] and Dietetics. Reviewed patient's dietary recall. Provided examples on ways to decrease sodium intake in diet. Discouraged intake of processed foods and use of salt shaker. Encouraged fresh fruits and vegetables as well as whole grain sources of carbohydrates to maximize fiber intake. RD discussed why it is important for patient to adhere to diet recommendations and emphasized foods to avoid. Discussed diabetic friendly drink options.Teach back method used.  Expect good compliance.  Body mass index is 41.67 kg/(m^2). Pt meets criteria for morbid obesity based on current BMI.  Current diet order is heart healthy/carb modified with 1800 ml fluids, patient is consuming approximately 100% of meals at this time. Labs and medications reviewed. No further nutrition interventions warranted at this time. RD contact information provided. If additional nutrition issues arise, please re-consult RD.   Corrin Parker, MS, RD, LDN Pager # [phone] After hours/ weekend pager # [phone]

## 2015-07-03 NOTE — Progress Notes (Signed)
Subjective: Pt is breathing better  No CP   Objective: Filed Vitals:   07/02/15 1124 07/02/15 2220 07/03/15 0608  BP: 129/45 131/43 116/49  Pulse: 67 77 69  Temp: 98.2 F (36.8 C) 98.9 F (37.2 C) 97.7 F (36.5 C)  TempSrc: Oral Oral Oral  Resp:  18 18  Height: 5\' 3"  (1.6 m)    Weight: 237 lb 12.8 oz (107.865 kg)  235 lb 3.2 oz (106.686 kg)  SpO2: 95% 96% 94%   Weight change:   Intake/Output Summary (Last 24 hours) at 07/03/15 0924 Last data filed at 07/03/15 0841  Gross per 24 hour  Intake    540 ml  Output   2151 ml  Net  -1611 ml   I/O  Neg 1.6 L    General: Alert, awake, oriented x3, in no acute distress  Morbidly obese Neck:  JVP is normal Heart: Regular rate and rhythm, without murmurs, rubs, gallops.  Lungs: Clear to auscultation.  No rales or wheezes. Exemities:  Tr edema.   Neuro: Grossly intact, nonfocal.  Tele  SR   Lab Results: Results for orders placed or performed during the hospital encounter of 07/02/15 (from the past 24 hour(s))  Glucose, capillary     Status: Abnormal   Collection Time: 07/02/15 11:29 AM  Result Value Ref Range   Glucose-Capillary 134 (H) 65 - 99 mg/dL  Comprehensive metabolic panel     Status: Abnormal   Collection Time: 07/02/15 12:17 PM  Result Value Ref Range   Sodium 145 135 - 145 mmol/L   Potassium 4.4 3.5 - 5.1 mmol/L   Chloride 101 101 - 111 mmol/L   CO2 29 22 - 32 mmol/L   Glucose, Bld 133 (H) 65 - 99 mg/dL   BUN 38 (H) 6 - 20 mg/dL   Creatinine, Ser 1.64 (H) 0.44 - 1.00 mg/dL   Calcium 9.8 8.9 - 10.3 mg/dL   Total Protein 6.5 6.5 - 8.1 g/dL   Albumin 3.4 (L) 3.5 - 5.0 g/dL   AST 26 15 - 41 U/L   ALT 25 14 - 54 U/L   Alkaline Phosphatase 67 38 - 126 U/L   Total Bilirubin 0.5 0.3 - 1.2 mg/dL   GFR calc non Af Amer 28 (L) >60 mL/min   GFR calc Af Amer 33 (L) >60 mL/min   Anion gap 15 5 - 15  TSH     Status: None   Collection Time: 07/02/15 12:17 PM  Result Value Ref Range   TSH 2.212 0.350 - 4.500 uIU/mL    CBC WITH DIFFERENTIAL     Status: Abnormal   Collection Time: 07/02/15 12:17 PM  Result Value Ref Range   WBC 9.5 4.0 - 10.5 K/uL   RBC 3.74 (L) 3.87 - 5.11 MIL/uL   Hemoglobin 11.3 (L) 12.0 - 15.0 g/dL   HCT 36.9 36.0 - 46.0 %   MCV 98.7 78.0 - 100.0 fL   MCH 30.2 26.0 - 34.0 pg   MCHC 30.6 30.0 - 36.0 g/dL   RDW 14.3 11.5 - 15.5 %   Platelets 196 150 - 400 K/uL   Neutrophils Relative % 60 %   Neutro Abs 5.7 1.7 - 7.7 K/uL   Lymphocytes Relative 22 %   Lymphs Abs 2.1 0.7 - 4.0 K/uL   Monocytes Relative 12 %   Monocytes Absolute 1.2 (H) 0.1 - 1.0 K/uL   Eosinophils Relative 6 %   Eosinophils Absolute 0.6 0.0 - 0.7 K/uL  Basophils Relative 0 %   Basophils Absolute 0.0 0.0 - 0.1 K/uL  Hemoglobin A1c     Status: Abnormal   Collection Time: 07/02/15 12:17 PM  Result Value Ref Range   Hgb A1c MFr Bld 8.5 (H) 4.8 - 5.6 %   Mean Plasma Glucose 197 mg/dL  Glucose, capillary     Status: Abnormal   Collection Time: 07/02/15  4:20 PM  Result Value Ref Range   Glucose-Capillary 188 (H) 65 - 99 mg/dL   Comment 1 Notify RN   Glucose, capillary     Status: Abnormal   Collection Time: 07/02/15  9:06 PM  Result Value Ref Range   Glucose-Capillary 236 (H) 65 - 99 mg/dL  Basic metabolic panel     Status: Abnormal   Collection Time: 07/03/15  3:11 AM  Result Value Ref Range   Sodium 141 135 - 145 mmol/L   Potassium 3.8 3.5 - 5.1 mmol/L   Chloride 97 (L) 101 - 111 mmol/L   CO2 29 22 - 32 mmol/L   Glucose, Bld 138 (H) 65 - 99 mg/dL   BUN 41 (H) 6 - 20 mg/dL   Creatinine, Ser 1.67 (H) 0.44 - 1.00 mg/dL   Calcium 9.3 8.9 - 10.3 mg/dL   GFR calc non Af Amer 27 (L) >60 mL/min   GFR calc Af Amer 32 (L) >60 mL/min   Anion gap 15 5 - 15  Glucose, capillary     Status: Abnormal   Collection Time: 07/03/15  5:53 AM  Result Value Ref Range   Glucose-Capillary 128 (H) 65 - 99 mg/dL    Studies/Results: No results found.  Medications: REviewed     @PROBHOSP @  1  Acute on chronic  diastolic CHF  Pt is diuresing  Feeling better  Still feels full in abdomen I would recomm continued IV lasix today  Dietary to see  Check labs in AM Echo just done  2.  DM  Hgb A1C 8.5   Dietary to see  And discuss diet  Will need to confirm insulin dosing  She is prob on a stricter diet here   Pt followed locally by endocrinologist  Says she has improved     3  Renal Follow  Cr stable      LOS: 1 day   Dorris Carnes 07/03/2015, 9:24 AM

## 2015-07-04 ENCOUNTER — Inpatient Hospital Stay (HOSPITAL_COMMUNITY): Payer: Medicare Other

## 2015-07-04 DIAGNOSIS — E785 Hyperlipidemia, unspecified: Secondary | ICD-10-CM

## 2015-07-04 DIAGNOSIS — R609 Edema, unspecified: Secondary | ICD-10-CM

## 2015-07-04 LAB — BASIC METABOLIC PANEL
Anion gap: 15 (ref 5–15)
BUN: 56 mg/dL — ABNORMAL HIGH (ref 6–20)
CALCIUM: 9.4 mg/dL (ref 8.9–10.3)
CO2: 30 mmol/L (ref 22–32)
CREATININE: 1.96 mg/dL — AB (ref 0.44–1.00)
Chloride: 96 mmol/L — ABNORMAL LOW (ref 101–111)
GFR, EST AFRICAN AMERICAN: 26 mL/min — AB (ref >60)
GFR, EST NON AFRICAN AMERICAN: 23 mL/min — AB (ref >60)
Glucose, Bld: 196 mg/dL — ABNORMAL HIGH (ref 65–99)
Potassium: 3.9 mmol/L (ref 3.5–5.1)
SODIUM: 141 mmol/L (ref 135–145)

## 2015-07-04 LAB — GLUCOSE, CAPILLARY
GLUCOSE-CAPILLARY: 184 mg/dL — AB (ref 65–99)
GLUCOSE-CAPILLARY: 176 mg/dL — AB (ref 65–99)
Glucose-Capillary: 306 mg/dL — ABNORMAL HIGH (ref 65–99)
Glucose-Capillary: 271 mg/dL — ABNORMAL HIGH (ref 65–99)

## 2015-07-04 LAB — BRAIN NATRIURETIC PEPTIDE: B NATRIURETIC PEPTIDE 5: 48.4 pg/mL (ref 0.0–100.0)

## 2015-07-04 MED ORDER — HEPARIN SODIUM (PORCINE) 5000 UNIT/ML IJ SOLN: 5000.0000 [IU] | Freq: Three times a day (TID) | INTRAMUSCULAR | Status: DC

## 2015-07-04 MED ORDER — INSULIN DETEMIR 100 UNIT/ML ~~LOC~~ SOLN
60.0000 [IU] | Freq: Two times a day (BID) | SUBCUTANEOUS | Status: DC
  Administered 2015-07-04 – 2015-07-05 (×2): 60 [IU] via SUBCUTANEOUS
  Filled 2015-07-04 (×3): qty 0.6

## 2015-07-04 MED ORDER — INSULIN ASPART 100 UNIT/ML ~~LOC~~ SOLN
0.0000 [IU] | Freq: Three times a day (TID) | SUBCUTANEOUS | Status: DC
  Administered 2015-07-04: 11 [IU] via SUBCUTANEOUS
  Administered 2015-07-05: 7 [IU] via SUBCUTANEOUS
  Administered 2015-07-05: 4 [IU] via SUBCUTANEOUS

## 2015-07-04 MED ORDER — INSULIN ASPART 100 UNIT/ML ~~LOC~~ SOLN: 0.0000 [IU] | Freq: Every day | SUBCUTANEOUS | Status: DC

## 2015-07-04 NOTE — Evaluation (Addendum)
Occupational Therapy Evaluation AND Discharge   Patient Details Name: Brandi Melton MRN: [id number] DOB: 05/26/1930 Today's Date: 07/04/2015    History of Present Illness Brandi Melton is an 82 yo with PMH: DM, HTN, OST and diastolic CHF. [place] due to her noncompliance with meds. 3/6/17pt with marked edema with 1- 2+ edema to thigh. 07/02/15 Pt presents to the hospital with very SOB and edema in legs.    Clinical Impression   Patient admitted with above. Patient mod I PTA. Patient currently functioning at an overall mod I level.  No additional OT needs identified, D/C from acute OT services and no additional follow-up OT needs at this time. All appropriate education provided to patient. Please re-order OT if needed.    Pt states she normally sits in her recliner for most of the day. Encouraged pt to get up and walk to her door and back ~every hour AND get up and walk to/from dinning hall at least twice per day. Encouraged pt to keep BLEs elevated when seated in recliner.     Follow Up Recommendations  No OT follow up;Supervision - Intermittent    Equipment Recommendations  None recommended by OT    Recommendations for Other Services  None at this time   Precautions / Restrictions Precautions Precautions: Fall Restrictions Weight Bearing Restrictions: No    Mobility Bed Mobility General bed mobility comments: Pt found seated in recliner upon OT entering/exiting room   Transfers Overall transfer level: Modified independent Equipment used: 4-wheeled walker Transfers: Sit to/from Stand Sit to Stand: Modified independent (Device/Increase time)   Balance Overall balance assessment: Needs assistance Sitting-balance support: No upper extremity supported;Feet supported Sitting balance-Leahy Scale: Good     Standing balance support: Bilateral upper extremity supported;During functional activity Standing balance-Leahy Scale: Good     ADL Overall ADL's : At baseline;Modified independent General ADL Comments: Pt takes increased time and uses DME prn to increase independence.     Vision Vision Assessment?: No apparent visual deficits          Pertinent Vitals/Pain Pain Assessment: No/denies pain     Hand Dominance Right   Extremity/Trunk Assessment Upper Extremity Assessment Upper Extremity Assessment: Overall WFL for tasks assessed   Lower Extremity Assessment Lower Extremity Assessment: Defer to PT evaluation   Cervical / Trunk Assessment Cervical / Trunk Assessment: Kyphotic   Communication Communication Communication: No difficulties   Cognition Arousal/Alertness: Awake/alert Behavior During Therapy: WFL for tasks assessed/performed Overall Cognitive Status: Within Functional Limits for tasks assessed              Home Living Family/patient expects to be discharged to:: Private residence (ILF) Living Arrangements: Alone Available Help at Discharge: Personal care attendant (PCA comes 3Xs per week to assist with compression stockings) Type of Home: Independent living facility Home Access: Level entry     Home Layout: One level     Bathroom Shower/Tub: Tub/shower unit;Curtain (walk-in tub)   Bathroom Toilet: Standard     Home Equipment: Bedside commode;Shower seat;Walker - 4 wheels   Additional Comments: Pt recently moved here from [place]       Prior Functioning/Environment Level of Independence: Independent with assistive device(s)     OT Diagnosis: Generalized weakness   OT Problem List:   N/a, no acute OT needs identified at this time     OT Treatment/Interventions:   N/a, no acute OT needs identified at this time  OT Goals(Current goals can be found in the care plan section) Acute Rehab OT Goals Patient Stated Goal: decrease swelling and go home tomorrow OT Goal Formulation: All assessment and education complete, DC therapy  OT Frequency:   N/a, no acute OT needs  identified at this time     Barriers to D/C:  None known at this time    End of Session Equipment Utilized During Treatment: Rolling walker Nurse Communication: Other (comment) (Bowel movement)  Activity Tolerance: Patient tolerated treatment well Patient left: in chair;with call bell/phone within reach   Time: [id number] OT Time Calculation (min): 20 min Charges:  OT General Charges $OT Visit: 1 Procedure OT Evaluation $OT Eval Moderate Complexity: 1 Procedure  Chrys Racer , MS, OTR/L, CLT Pager: [phone]  07/04/2015, 2:12 PM

## 2015-07-04 NOTE — Progress Notes (Signed)
Heart Failure Navigator Consult Note  Presentation: Brandi Melton presented withis an 82 yo who I saw for the first time in November 2016 She has a history of DM, HTN, OST and diastolic CHF Cath in [facility] in Feb 2015 showed LVEF 65 to 70% with minimal CAD of LAD, RCA R and L filling pressure elevated at time She was hosp last fall in [place] Volume overload felt to be due to her noncompliance with meds   I saw her in clinic on 06/18/15 At that time she had marked edema with 1- 2+ edema to thigh She was very SOB I recomm she continue on lasix Added 2.5 Zaroxyln to regimen MWF Stopped aldactone Cut micardis in 1/2 to 20 mg She began taking and came back for labs Said that she continued to be very SOB Still had edema in legs With renal insuff I recomm she come in to hosp for IV diuresis and close f/u of labs  She is still SOB With minimal activity she stops Daughter is with her and concurs  She says mid week last week she noticed some increased output with diuretic  Admits to eating [employer] foods and hot dogs.   Past Medical History  Diagnosis Date  . Diabetes mellitus without complication ([place])   . Hypertension   . Hyperlipemia   . Depression   . Myocardial infarction [facility])     Maybe a small one- unaware when  . Shortness of breath dyspnea     with exertion  . CHF (congestive heart failure) ([place])   . Sleep apnea   . Palsy, Bell's   . RA (rheumatoid arthritis) ([place])     Social History   Social History  . Marital Status: Widowed    Spouse Name: N/A  . Number of Children: N/A  . Years of Education: N/A   Social History Main Topics  . Smoking status: Never Smoker   . Smokeless tobacco: Never Used  . Alcohol Use: No  . Drug Use: No  . Sexual Activity: Not Asked   Other Topics Concern  . None   Social History Narrative    ECHO:Study Conclusions--07/03/15  - Left ventricle: The cavity size was normal. Systolic function was  vigorous. The estimated  ejection fraction was in the range of 65%  to 70%. Wall motion was normal; there were no regional wall  motion abnormalities. Doppler parameters are consistent with  abnormal left ventricular relaxation (grade 1 diastolic  dysfunction). Doppler parameters are consistent with elevated  ventricular end-diastolic filling pressure. - Aortic valve: Trileaflet; normal thickness leaflets.  Transvalvular velocity was within the normal range. There was no  stenosis. There was no regurgitation. - Aortic root: The aortic root was normal in size. - Mitral valve: Severe mitral annular calcifications, predominantly  posterior. Calcified annulus. Mildly thickened leaflets . There  was no regurgitation. - Right ventricle: The cavity size was normal. Wall thickness was  normal. Systolic function was normal. - Tricuspid valve: There was no regurgitation. - Pulmonary arteries: Systolic pressure was within the normal  range.  Transthoracic echocardiography. M-mode, complete 2D, spectral Doppler, and color Doppler. Birthdate: Patient birthdate: 1930/05/06. Age: Patient is 82 yr old. Sex: Gender: female. BMI: 41.7 kg/m^2. Blood pressure: 98/66 Patient status: Inpatient. Study date: Study date: 07/03/2015. Study time: 02:14 PM. Location: Bedside.   BNP    Component Value Date/Time   BNP 48.4 07/04/2015 0555    ProBNP No results found for: PROBNP   Education Assessment and Provision:  Detailed education and instructions provided on heart failure disease management including the following:  Signs and symptoms of Heart Failure When to call the physician Importance of daily weights Low sodium diet Fluid restriction Medication management Anticipated future follow-up appointments  Patient education given on each of the above topics.  Patient acknowledges understanding and acceptance of all instructions.  I spoke with Ms. Rehl regarding her HF.  She admits that she  was not weighing each day.  I have stressed the importance of daily weights and how weight increases relate to signs and symptoms of HF.  She does admit to eating "the wrong things"--such as potato chips and canned soups.  I reviewed a low sodium diet and high sodium foods to avoid.  She denies any issues with getting or taking prescribed medications.  She follows with [facility]- Dr Harrington Challenger and will see her as an outpatient.  Education Materials:  "Living Better With Heart Failure" Booklet, Daily Weight Tracker Tool   High Risk Criteria for Readmission and/or Poor Patient Outcomes:   EF <30%- No 65-70%  2 or more admissions in 6 months- 1/6  Difficult social situation- No   Demonstrates medication noncompliance- no denies    Barriers of Care:  Knowledge and compliance  Discharge Planning:   She plans to return to [facility] independent living.  She would benefit from [facility] for ongoing education, compliance reinforcement and symptom recognition.

## 2015-07-04 NOTE — Evaluation (Signed)
Physical Therapy Evaluation AND Discharge Patient Details Name: Brandi Melton MRN: [id number] DOB: 1930-11-02 Today's Date: 07/04/2015   History of Present Illness  Brandi Melton is an 82 yo with PMH: DM, HTN, OST and diastolic CHF. [place] to her non[place] with meds. 3/6/17pt with marked edema with 1- 2+ edema to thigh. 07/02/15 Pt presents to the hospital with very SOB and edema in legs.   Clinical Impression  Patient presents for evaluation of mobility. Patient ambulating with rollator 4 wheeled walking at modified independent levels, no assist or cues required. Educated patient on energy conservation and edema management techniques. At this time, anticipate patient will be safe for return to Independent living facility. No further acute PT needs, will sign off.    Follow Up Recommendations No PT follow up    Equipment Recommendations   (Rollator )    Recommendations for Other Services       Precautions / Restrictions Precautions Precautions: Fall Restrictions Weight Bearing Restrictions: No      Mobility  Bed Mobility               General bed mobility comments: Pt found seated in recliner upon OT entering/exiting room   Transfers Overall transfer level: Modified independent Equipment used: 4-wheeled walker Transfers: Sit to/from Stand Sit to Stand: Modified independent (Device/Increase time)            Ambulation/Gait Ambulation/Gait assistance: Modified independent (Device/Increase time) Ambulation Distance (Feet): 340 Feet Assistive device: 4-wheeled walker Gait Pattern/deviations: WFL(Within Functional Limits)   Gait velocity interpretation: at or above normal speed for age/gender General Gait Details: steady with use of rollator, one standing rest break. Ambulated on room air with saturations remaining above 90% on room air  Stairs            Wheelchair Mobility    Modified Rankin  (Stroke Patients Only)       Balance Overall balance assessment: Needs assistance Sitting-balance support: No upper extremity supported Sitting balance-Leahy Scale: Good     Standing balance support: Bilateral upper extremity supported Standing balance-Leahy Scale: Good                               Pertinent Vitals/Pain Pain Assessment: No/denies pain    Home Living Family/patient expects to be discharged to:: Private residence (ILF) Living Arrangements: Alone Available Help at Discharge: Personal care attendant (PCA comes 3Xs per week to assist with compression stockings) Type of Home: Independent living facility Home Access: Level entry     Home Layout: One level Home Equipment: Bedside commode;Shower seat;Walker - 4 wheels Additional Comments: Pt recently moved here from [place]     Prior Function Level of Independence: Independent with assistive device(s)         Comments: pt used RW and was able to do all ADLs. Since fall 2 weeks ago pt has been using a hemi walker.     Hand Dominance   Dominant Hand: Right    Extremity/Trunk Assessment   Upper Extremity Assessment: Overall WFL for tasks assessed           Lower Extremity Assessment: Overall WFL for tasks assessed (increased bilateral LE edema R>L)      Cervical / Trunk Assessment: Kyphotic  Communication   Communication: No difficulties  Cognition Arousal/Alertness: Awake/alert Behavior During Therapy: WFL for tasks assessed/performed Overall Cognitive Status: Within  Functional Limits for tasks assessed                      General Comments General comments (skin integrity, edema, etc.): educated on BLE ROM, energy conservation and activities for edema control.    Exercises        Assessment/Plan    PT Assessment Patent does not need any further PT services  PT Diagnosis Difficulty walking   PT Problem List    PT Treatment Interventions     PT Goals (Current  goals can be found in the Care Plan section) Acute Rehab PT Goals Patient Stated Goal: decrease swelling and go home tomorrow PT Goal Formulation: All assessment and education complete, DC therapy    Frequency     Barriers to discharge        Co-evaluation               End of Session   Activity Tolerance: Patient tolerated treatment well Patient left: in chair;with call bell/phone within reach Nurse Communication: Mobility status         Time: [id number] PT Time Calculation (min) (ACUTE ONLY): 20 min   Charges:   PT Evaluation $PT Eval Moderate Complexity: 1 Procedure     PT G CodesDuncan Dull 2015/07/06, 2:46 PM Alben Deeds, [place] DPT  [phone]

## 2015-07-04 NOTE — Progress Notes (Signed)
Preliminary results by tech - Right Lower Ext. Venous Duplex Completed. No evidence of obvious deep or superficial vein thrombosis in the right leg. It is noted, the calf veins were technically difficult to visualized due to body habitus. Oda Cogan, BS, RDMS, RVT

## 2015-07-04 NOTE — Clinical Documentation Improvement (Signed)
Cardiology  Abnormal Lab/Test Results:  07/04/15: BUN= 56; Crea= 1.96; GFR= 23  Possible Clinical Conditions associated with below indicators  Acute Kidney Injury  Other Condition  Cannot Clinically Determine   Please exercise your independent, professional judgment when responding. A specific answer is not anticipated or expected.   Thank You,  Rolm Gala, RN, [place] [phone]

## 2015-07-04 NOTE — Progress Notes (Addendum)
Subjective: Breathing is better  No CP   Objective: Filed Vitals:   07/03/15 1237 07/03/15 2209 07/04/15 0123 07/04/15 0529  BP: 115/42 127/46 114/42 106/36  Pulse: 66 73 64 62  Temp: 98.2 F (36.8 C) 98 F (36.7 C) 97.5 F (36.4 C) 97.9 F (36.6 C)  TempSrc: Oral Oral Oral Oral  Resp: 18 18 18 18   Height:      Weight:    233 lb 11.2 oz (106.006 kg)  SpO2: 93% 93% 94% 93%   Weight change: -4 lb 1.6 oz (-1.86 kg)  Intake/Output Summary (Last 24 hours) at 07/04/15 0755 Last data filed at 07/04/15 0529  Gross per 24 hour  Intake   1120 ml  Output   1950 ml  Net   -830 ml   I/O  -2.8 L  General: Alert, awake, oriented x3, in no acute distress Neck:  JVP is normal Heart: Regular rate and rhythm, without murmurs, rubs, gallops.  Lungs: Clear to auscultation.  No rales or wheezes. Exemities:  No edema.   Neuro: Grossly intact, nonfocal.  Tele:  SR  Lab Results: Results for orders placed or performed during the hospital encounter of 07/02/15 (from the past 24 hour(s))  Glucose, capillary     Status: Abnormal   Collection Time: 07/03/15 11:35 AM  Result Value Ref Range   Glucose-Capillary 198 (H) 65 - 99 mg/dL  Glucose, capillary     Status: Abnormal   Collection Time: 07/03/15  4:41 PM  Result Value Ref Range   Glucose-Capillary 246 (H) 65 - 99 mg/dL  Glucose, capillary     Status: Abnormal   Collection Time: 07/03/15  8:47 PM  Result Value Ref Range   Glucose-Capillary 246 (H) 65 - 99 mg/dL  Basic metabolic panel     Status: Abnormal   Collection Time: 07/04/15  5:55 AM  Result Value Ref Range   Sodium 141 135 - 145 mmol/L   Potassium 3.9 3.5 - 5.1 mmol/L   Chloride 96 (L) 101 - 111 mmol/L   CO2 30 22 - 32 mmol/L   Glucose, Bld 196 (H) 65 - 99 mg/dL   BUN 56 (H) 6 - 20 mg/dL   Creatinine, Ser 1.96 (H) 0.44 - 1.00 mg/dL   Calcium 9.4 8.9 - 10.3 mg/dL   GFR calc non Af Amer 23 (L) >60 mL/min   GFR calc Af Amer 26 (L) >60 mL/min   Anion gap 15 5 - 15  Brain  natriuretic peptide     Status: None   Collection Time: 07/04/15  5:55 AM  Result Value Ref Range   B Natriuretic Peptide 48.4 0.0 - 100.0 pg/mL  Glucose, capillary     Status: Abnormal   Collection Time: 07/04/15  6:54 AM  Result Value Ref Range   Glucose-Capillary 184 (H) 65 - 99 mg/dL    Studies/Results: No results found.  Medications: Reviewed     @PROBHOSP @  1  Acute on chronic diastolic CHF  Echo yesterday shows normal LVEF MIld diastolic dysfunction She has had signif diuresis  BNP normal ( though obesity can effect)   Unfort she is too large to use vest  I would hold lasix this AM  Hold ARB as well today. WIll check LE USN on R   Rule out DVT    2  HTN  BP is good  Hold ARB today  3.  Nutrition  Pt spoke to dietary yesterday  She says that was very helpful  Should  Be able to follow    4  DM  Continue insulin  AIC is not well controlled  WIll need outpt f/u with endo   Ambulate some today  WIll have PT see pt    5  HL Continue vytorin    Possilble home tomorrow  Labs in AM      LOS: 2 days   Dorris Carnes 07/04/2015, 7:55 AM

## 2015-07-05 DIAGNOSIS — I10 Essential (primary) hypertension: Secondary | ICD-10-CM | POA: Diagnosis present

## 2015-07-05 DIAGNOSIS — G4730 Sleep apnea, unspecified: Secondary | ICD-10-CM | POA: Diagnosis present

## 2015-07-05 DIAGNOSIS — I5033 Acute on chronic diastolic (congestive) heart failure: Secondary | ICD-10-CM

## 2015-07-05 DIAGNOSIS — E785 Hyperlipidemia, unspecified: Secondary | ICD-10-CM | POA: Diagnosis present

## 2015-07-05 DIAGNOSIS — G510 Bell's palsy: Secondary | ICD-10-CM

## 2015-07-05 DIAGNOSIS — M069 Rheumatoid arthritis, unspecified: Secondary | ICD-10-CM | POA: Diagnosis present

## 2015-07-05 LAB — BASIC METABOLIC PANEL
Anion gap: 10 (ref 5–15)
BUN: 56 mg/dL — ABNORMAL HIGH (ref 6–20)
CALCIUM: 9.3 mg/dL (ref 8.9–10.3)
CO2: 32 mmol/L (ref 22–32)
CREATININE: 1.8 mg/dL — AB (ref 0.44–1.00)
Chloride: 96 mmol/L — ABNORMAL LOW (ref 101–111)
GFR calc non Af Amer: 25 mL/min — ABNORMAL LOW (ref >60)
GFR, EST AFRICAN AMERICAN: 29 mL/min — AB (ref >60)
Glucose, Bld: 158 mg/dL — ABNORMAL HIGH (ref 65–99)
Potassium: 3.9 mmol/L (ref 3.5–5.1)
SODIUM: 138 mmol/L (ref 135–145)

## 2015-07-05 LAB — GLUCOSE, CAPILLARY
GLUCOSE-CAPILLARY: 155 mg/dL — AB (ref 65–99)
Glucose-Capillary: 237 mg/dL — ABNORMAL HIGH (ref 65–99)

## 2015-07-05 LAB — SEDIMENTATION RATE: SED RATE: 55 mm/h — AB (ref 0–22)

## 2015-07-05 MED ORDER — FUROSEMIDE 80 MG PO TABS: 80.0000 mg | ORAL_TABLET | Freq: Every day | ORAL | Status: DC

## 2015-07-05 MED ORDER — METOLAZONE 2.5 MG PO TABS: ORAL_TABLET | ORAL | Status: DC

## 2015-07-05 NOTE — Discharge Summary (Signed)
Discharge Summary    Patient ID: Brandi Melton,  MRN: [id number], DOB/AGE: 1930-07-10 82 y.o.  Admit date: 07/02/2015 Discharge date: 07/05/2015  Primary Care Provider: Pcp Not In System Primary Cardiologist: Dr. Harrington Challenger  Discharge Diagnoses    Principal Problem:   Acute on chronic diastolic (congestive) heart failure (HCC) Active Problems:   CHF (congestive heart failure) (HCC)   Diabetes mellitus without complication (HCC)   Hypertension   Hyperlipemia   Sleep apnea   RA (rheumatoid arthritis) (HCC)   Palsy, Bell's   AKI   Allergies No Known Allergies  Diagnostic Studies/Procedures    Transthoracic Echocardiography 07/03/15 LV EF: 65% - 70%  ------------------------------------------------------------------- Indications: CHF - 428.0.  ------------------------------------------------------------------- History: Risk factors: Diabetes mellitus.  ------------------------------------------------------------------- Study Conclusions  - Left ventricle: The cavity size was normal. Systolic function was  vigorous. The estimated ejection fraction was in the range of 65%  to 70%. Wall motion was normal; there were no regional wall  motion abnormalities. Doppler parameters are consistent with  abnormal left ventricular relaxation (grade 1 diastolic  dysfunction). Doppler parameters are consistent with elevated  ventricular end-diastolic filling pressure. - Aortic valve: Trileaflet; normal thickness leaflets.  Transvalvular velocity was within the normal range. There was no  stenosis. There was no regurgitation. - Aortic root: The aortic root was normal in size. - Mitral valve: Severe mitral annular calcifications, predominantly  posterior. Calcified annulus. Mildly thickened leaflets . There  was no regurgitation. - Right ventricle: The cavity size was normal. Wall thickness was  normal. Systolic function was normal. - Tricuspid valve: There  was no regurgitation. - Pulmonary arteries: Systolic pressure was within the normal  range.    History of Present Illness     Pt is an 82 yo with history of DM, HTN, OST and diastolic CHF who presented 07/02/15 with SOB.   Cath in [facility] in Feb 2015 showed LVEF 65 to 70% with minimal CAD of LAD, RCA R and L filling pressure elevated at time She was hospitalised last fall in [place] with Volume overload felt to be due to her noncompliance with meds. Seen by Dr. Harrington Challenger in clinic on 06/18/15.  At that time she had marked edema with 1- 2+ edema to thigh. She was very SOBand recommended she continue on lasix. Added 2.5 Zaroxyln to regimen MWF.  Stopped aldactone. Cut micardis in 1/2 to 20 mg.  She began taking and came back for labs and said that she continued to be very SOB. Still had edema in legswith renal insuff and recommended she come in to hosp for IV diuresis and close f/u of labs.   She continued to have SOB with minimal activity. Admits to eating [employer] frozen foods and hot dogs.   Hospital Course     Consultants: None  The patient was admitted for IV diuresis. Echo showed normal LVEF MIld diastolic dysfunction. BNP normal ( though obesity can effect).Unfortunetly she is too large to use vest. Held ARB due to elevated Scr. Given Heart failure education. She diuresed total negative of 3.4L with 3lb weight loss (237-->234lb). Scr continued to improved. SOB improved significantly. A1c of 8.5 with elevated blood sugar. Find out that she was not taking her metformin. Advised to resume along with insulin and f/u with endocrinologist.   The patient has been seen by Dr. Harrington Challenger today and deemed ready for discharge home. All follow-up appointments have been scheduled. Discharge medications are listed below.    She will be discharged on  PO lasix at 80 bid.  Take Zaroxylyn 2x per week (Tues and Fri ) 2.5 mg.  WIll need f/u BMET and BNP during office visit 3/28. Resume coreg. Hold ARB until  stable Scr.   Discharge Vitals Blood pressure 119/43, pulse 66, temperature 98.1 F (36.7 C), temperature source Oral, resp. rate 20, height 5\' 3"  (1.6 m), weight 234 lb 3.2 oz (106.232 kg), SpO2 93 %.  Filed Weights   07/03/15 0608 07/04/15 0529 07/05/15 0617  Weight: 235 lb 3.2 oz (106.686 kg) 233 lb 11.2 oz (106.006 kg) 234 lb 3.2 oz (106.232 kg)    Labs & Radiologic Studies     CBC No results for input(s): WBC, NEUTROABS, HGB, HCT, MCV, PLT in the last 72 hours. Basic Metabolic Panel  Recent Labs  07/04/15 0555 07/05/15 0400  NA 141 138  K 3.9 3.9  CL 96* 96*  CO2 30 32  GLUCOSE 196* 158*  BUN 56* 56*  CREATININE 1.96* 1.80*  CALCIUM 9.4 9.3   No results found.  Disposition   Pt is being discharged home today in good condition.  Follow-up Plans & Appointments    Follow-up Information    Follow up with SIMMONS, BRITTAINY, PA-C. Go on 07/10/2015.   Specialties:  Cardiology, Radiology   Why:  @8 :00 for hospital f/u   Contact information:   [place] [address] [phone]       Follow up with Endocrinologist.   Contact information:   for DM Managment as schedule every 3 months     Discharge Instructions    Diet - low sodium heart healthy    Complete by:  As directed      Discharge instructions    Complete by:  As directed   *Weigh yourself on the same scale at same time of day and keep a log. *Report weight gain of > 2 lbs in 1 day or 5 lbs over the course of a week and/or symptoms of excess fluid (shortness of breath, difficulty lying flat, swelling, poor appetite, abdominal fullness/bloating, etc) to your doctor immediately. *Avoid foods that are high in sodium (processed, pre-packaged/canned goods, fast foods, etc). *Please attend all scheduled and reccommended follow up appointments    - Please keep daily log of BP and weight and bring to appointment next week.     Increase activity slowly    Complete by:  As directed             Discharge Medications   Current Discharge Medication List    CONTINUE these medications which have CHANGED   Details  furosemide (LASIX) 80 MG tablet Take 1 tablet (80 mg total) by mouth daily. Qty: 60 tablet, Refills: 6    metolazone (ZAROXOLYN) 2.5 MG tablet Take one tablet by mouth 30 minutes before taking Lasix on Tuesday and Friday Qty: 45 tablet, Refills: 3      CONTINUE these medications which have NOT CHANGED   Details  aspirin EC 81 MG tablet Take 81 mg by mouth daily.    calcium carbonate (TUMS EX) 750 MG chewable tablet Chew 1 tablet by mouth daily.    carvedilol (COREG CR) 80 MG 24 hr capsule Take 1 capsule (80 mg total) by mouth daily. Qty: 90 capsule, Refills: 3    cholecalciferol (VITAMIN D) 1000 units tablet Take 3,000 Units by mouth daily.    ezetimibe-simvastatin (VYTORIN) 10-40 MG per tablet Take 1 tablet by mouth daily.     folic acid (  FOLVITE) 400 MCG tablet Take 400 mcg by mouth daily.    insulin aspart (NOVOLOG) 100 UNIT/ML FlexPen Inject 20-25 Units into the skin 3 (three) times daily with meals. Inject 20 units subcutaneously with breakfast and lunch, inject 25 units with supper    Insulin Detemir (LEVEMIR) 100 UNIT/ML Pen Inject 80 Units into the skin 2 (two) times daily.     magnesium oxide (MAG-OX) 400 MG tablet Take 400 mg by mouth daily.    Vitamin D, Ergocalciferol, (DRISDOL) 50000 UNITS CAPS capsule Take 50,000 Units by mouth every Wednesday.     chlorhexidine (PERIDEX) 0.12 % solution Use as directed 5 mLs in the mouth or throat 2 (two) times daily.     oxyCODONE (OXY IR/ROXICODONE) 5 MG immediate release tablet Take 1 tablet (5 mg total) by mouth every 6 (six) hours as needed for moderate pain. Qty: 30 tablet, Refills: 0    potassium chloride SA (K-DUR,KLOR-CON) 20 MEQ tablet Take 20 mEq by mouth daily.      STOP taking these medications     telmisartan (MICARDIS) 40 MG tablet            Outstanding Labs/Studies    BMET and BNP during post hospital visit  Duration of Discharge Encounter   Greater than 30 minutes including physician time.  Signed, Coriana Angello PA-C 07/05/2015, 12:55 PM

## 2015-07-05 NOTE — Progress Notes (Signed)
Subjective: No CP  Breathng is better  Still wants to know about veins   Objective: Filed Vitals:   07/04/15 0529 07/04/15 1148 07/04/15 2042 07/05/15 0617  BP: 106/36 96/48 119/54 119/43  Pulse: 62 63 72 66  Temp: 97.9 F (36.6 C) 98.2 F (36.8 C) 98.3 F (36.8 C) 98.1 F (36.7 C)  TempSrc: Oral Oral Oral Oral  Resp: 18 16 18 20   Height:      Weight: 233 lb 11.2 oz (106.006 kg)   234 lb 3.2 oz (106.232 kg)  SpO2: 93% 93% 93% 93%   Weight change: 8 oz (0.227 kg)  Intake/Output Summary (Last 24 hours) at 07/05/15 0942 Last data filed at 07/05/15 [id number]  Gross per 24 hour  Intake    700 ml  Output   1451 ml  Net   -751 ml    General: Alert, awake, oriented x3, in no acute distress Neck:  JVP is normal Heart: Regular rate and rhythm, without murmurs, rubs, gallops.  Lungs: Clear to auscultation.  No rales or wheezes. Exemities:  No edema.   Neuro: Grossly intact, nonfocal.  Tele:  SR    Lab Results: Results for orders placed or performed during the hospital encounter of 07/02/15 (from the past 24 hour(s))  Glucose, capillary     Status: Abnormal   Collection Time: 07/04/15 11:47 AM  Result Value Ref Range   Glucose-Capillary 306 (H) 65 - 99 mg/dL   Comment 1 Notify RN   Glucose, capillary     Status: Abnormal   Collection Time: 07/04/15  4:42 PM  Result Value Ref Range   Glucose-Capillary 271 (H) 65 - 99 mg/dL   Comment 1 Notify RN   Glucose, capillary     Status: Abnormal   Collection Time: 07/04/15  9:32 PM  Result Value Ref Range   Glucose-Capillary 176 (H) 65 - 99 mg/dL   Comment 1 Notify RN    Comment 2 Document in Chart   Basic metabolic panel     Status: Abnormal   Collection Time: 07/05/15  4:00 AM  Result Value Ref Range   Sodium 138 135 - 145 mmol/L   Potassium 3.9 3.5 - 5.1 mmol/L   Chloride 96 (L) 101 - 111 mmol/L   CO2 32 22 - 32 mmol/L   Glucose, Bld 158 (H) 65 - 99 mg/dL   BUN 56 (H) 6 - 20 mg/dL   Creatinine, Ser 1.80 (H) 0.44 - 1.00  mg/dL   Calcium 9.3 8.9 - 10.3 mg/dL   GFR calc non Af Amer 25 (L) >60 mL/min   GFR calc Af Amer 29 (L) >60 mL/min   Anion gap 10 5 - 15  Glucose, capillary     Status: Abnormal   Collection Time: 07/05/15  6:05 AM  Result Value Ref Range   Glucose-Capillary 155 (H) 65 - 99 mg/dL    Studies/Results: No results found.  Medications: Reviewed    @PROBHOSP @  1  Acute on chronic diastolic CHF Volume status is improved from admitt  IV lasix held  I would resume PO at 80 bid  Take Zaroxylyn 2x per week  (Tues and Fri ) 2.5 mg   WIll need f/u BMET and BNP on Monday    2  HTN  BP is rel low  ARB has been held  Iwould continue to hold  Check BP at home  Recheck on Monday  May resume  Continue Coreg  3.  DM  Resume homoe  orders    4  HL  Continue Vytorin     Pt knows diet changes she needs to make  Watch Na  Weigh daily  Keep log  D/c today    LOS: 3 days   Dorris Carnes 07/05/2015, 9:42 AM

## 2015-07-05 NOTE — Care Management Important Message (Signed)
Important Message  Patient Details  Name: Brandi Melton MRN: [id number] Date of Birth: 06-14-1930   Medicare Important Message Given:  Yes    Levi Klaiber P Overton 07/05/2015, 1:07 PM

## 2015-07-06 ENCOUNTER — Telehealth: Payer: Self-pay | Admitting: Internal Medicine

## 2015-07-06 NOTE — Telephone Encounter (Signed)
Brandi Melton is calling about a prescription for a walker and she has not received the prescription for it as of yet , Please Call   Thanks

## 2015-07-09 NOTE — Telephone Encounter (Signed)
Left detailed message on patient's mobile number that I have put the prescription in the outgoing mail today for a seat, 4 wheeled walker.

## 2015-07-09 NOTE — Telephone Encounter (Signed)
F/u  Pt following up on Rx for walker. Please call back and discuss.

## 2015-07-10 ENCOUNTER — Encounter: Payer: Medicare Other | Admitting: Cardiology

## 2015-07-11 ENCOUNTER — Telehealth (HOSPITAL_COMMUNITY): Payer: Self-pay | Admitting: Surgery

## 2015-07-11 ENCOUNTER — Telehealth: Payer: Self-pay | Admitting: *Deleted

## 2015-07-11 DIAGNOSIS — I5033 Acute on chronic diastolic (congestive) heart failure: Secondary | ICD-10-CM

## 2015-07-11 NOTE — Telephone Encounter (Signed)
Heart Failure Nurse Navigator Post Discharge Telephone Call  I called to check in on patient after her recent hospitalization.  She says that she has been weighing each day--weight today was 231.6lbs versus discharge weight 234.2lbs on 3/23.  She admits to some "slight SOB" however says it is "slowly getting better".  She has been "doing much better" with her low sodium diet and is not eating high sodium foods as "before".  She denies any issues with getting or taking prescribed medications.  She follows with [facility] and has a lab appt tomorrow--had to push her follow-up appt to April.  I clarified and she says that she is only taking Lasix once daily.  I encouraged her to call me back with any concerns or questions related to her HF.

## 2015-07-11 NOTE — Telephone Encounter (Signed)
Spoke with patient. She had to reschedule her appointment from yesterday to 07/16/15. Per Dr. Harrington Challenger patient needs to have labs today to monitor kidney function.  She states it is impossible to come today but she can try to come tomorrow. Explained importance of timely blood work for patient's electrolytes and kidney function to be monitored appropriately with taking lasix.  Pt states I don't know why I have to take the 2nd dose of lasix so late in the day----advised patient she is only ordered lasix once daily.     Stressed importance of lab work asap and spoke with Dr. Harrington Challenger, who then called patient.  Patient will come tomorrow for BMET and BNP.

## 2015-07-12 ENCOUNTER — Other Ambulatory Visit (INDEPENDENT_AMBULATORY_CARE_PROVIDER_SITE_OTHER): Payer: Medicare Other | Admitting: *Deleted

## 2015-07-12 DIAGNOSIS — I5033 Acute on chronic diastolic (congestive) heart failure: Secondary | ICD-10-CM | POA: Diagnosis not present

## 2015-07-12 DIAGNOSIS — I509 Heart failure, unspecified: Secondary | ICD-10-CM | POA: Diagnosis not present

## 2015-07-12 LAB — BASIC METABOLIC PANEL
BUN: 74 mg/dL — ABNORMAL HIGH (ref 7–25)
CHLORIDE: 95 mmol/L — AB (ref 98–110)
CO2: 31 mmol/L (ref 20–31)
Calcium: 9.3 mg/dL (ref 8.6–10.4)
Creat: 1.96 mg/dL — ABNORMAL HIGH (ref 0.60–0.88)
Glucose, Bld: 163 mg/dL — ABNORMAL HIGH (ref 65–99)
POTASSIUM: 3.8 mmol/L (ref 3.5–5.3)
SODIUM: 138 mmol/L (ref 135–146)

## 2015-07-12 LAB — BRAIN NATRIURETIC PEPTIDE: BRAIN NATRIURETIC PEPTIDE: 42.4 pg/mL (ref <100)

## 2015-07-16 ENCOUNTER — Encounter: Payer: Self-pay | Admitting: Cardiology

## 2015-07-16 ENCOUNTER — Ambulatory Visit (INDEPENDENT_AMBULATORY_CARE_PROVIDER_SITE_OTHER): Payer: Medicare Other | Admitting: Cardiology

## 2015-07-16 VITALS — BP 98/58 | HR 66 | Ht 63.0 in | Wt 240.0 lb

## 2015-07-16 DIAGNOSIS — Z79899 Other long term (current) drug therapy: Secondary | ICD-10-CM

## 2015-07-16 LAB — BASIC METABOLIC PANEL
BUN: 45 mg/dL — ABNORMAL HIGH (ref 7–25)
CHLORIDE: 101 mmol/L (ref 98–110)
CO2: 30 mmol/L (ref 20–31)
Calcium: 9.1 mg/dL (ref 8.6–10.4)
Creat: 1.3 mg/dL — ABNORMAL HIGH (ref 0.60–0.88)
Glucose, Bld: 117 mg/dL — ABNORMAL HIGH (ref 65–99)
POTASSIUM: 4 mmol/L (ref 3.5–5.3)
SODIUM: 140 mmol/L (ref 135–146)

## 2015-07-16 NOTE — Patient Instructions (Addendum)
Medication Instructions:  Your physician recommends that you continue on your current medications as directed. Please refer to the Current Medication list given to you today.   Labwork: TODAY   BMET  Testing/Procedures:  Your physician recommends that you weigh, daily, at the same time every day, and in the same amount of clothing. Please record your daily weights on the handout provided and bring it to your next appointment.   Follow-Up: Your physician recommends that you schedule a follow-up appointment in: 3 MONTHS  WITH DR  Brandi Melton  Any Other Special Instructions Will Be Listed Below (If Applicable). Heart Failure Heart failure is a condition in which the heart has trouble pumping blood. This means your heart does not pump blood efficiently for your body to work well. In some cases of heart failure, fluid may back up into your lungs or you may have swelling (edema) in your lower legs. Heart failure is usually a long-term (chronic) condition. It is important for you to take good care of yourself and follow your health care provider's treatment plan. CAUSES  Some health conditions can cause heart failure. Those health conditions include:  High blood pressure (hypertension). Hypertension causes the heart muscle to work harder than normal. When pressure in the blood vessels is high, the heart needs to pump (contract) with more force in order to circulate blood throughout the body. High blood pressure eventually causes the heart to become stiff and weak.  Coronary artery disease (CAD). CAD is the buildup of cholesterol and fat (plaque) in the arteries of the heart. The blockage in the arteries deprives the heart muscle of oxygen and blood. This can cause chest pain and may lead to a heart attack. High blood pressure can also contribute to CAD.  Heart attack (myocardial infarction). A heart attack occurs when one or more arteries in the heart become blocked. The loss of oxygen damages the muscle  tissue of the heart. When this happens, part of the heart muscle dies. The injured tissue does not contract as well and weakens the heart's ability to pump blood.  Abnormal heart valves. When the heart valves do not open and close properly, it can cause heart failure. This makes the heart muscle pump harder to keep the blood flowing.  Heart muscle disease (cardiomyopathy or myocarditis). Heart muscle disease is damage to the heart muscle from a variety of causes. These can include drug or alcohol abuse, infections, or unknown reasons. These can increase the risk of heart failure.  Lung disease. Lung disease makes the heart work harder because the lungs do not work properly. This can cause a strain on the heart, leading it to fail.  Diabetes. Diabetes increases the risk of heart failure. High blood sugar contributes to high fat (lipid) levels in the blood. Diabetes can also cause slow damage to tiny blood vessels that carry important nutrients to the heart muscle. When the heart does not get enough oxygen and food, it can cause the heart to become weak and stiff. This leads to a heart that does not contract efficiently.  Other conditions can contribute to heart failure. These include abnormal heart rhythms, thyroid problems, and low blood counts (anemia). Certain unhealthy behaviors can increase the risk of heart failure, including:  Being overweight.  Smoking or chewing tobacco.  Eating foods high in fat and cholesterol.  Abusing illicit drugs or alcohol.  Lacking physical activity. SYMPTOMS  Heart failure symptoms may vary and can be hard to detect. Symptoms may include:  Shortness of breath with activity, such as climbing stairs.  Persistent cough.  Swelling of the feet, ankles, legs, or abdomen.  Unexplained weight gain.  Difficulty breathing when lying flat (orthopnea).  Waking from sleep because of the need to sit up and get more air.  Rapid heartbeat.  Fatigue and loss of  energy.  Feeling light-headed, dizzy, or close to fainting.  Loss of appetite.  Nausea.  Increased urination during the night (nocturia). DIAGNOSIS  A diagnosis of heart failure is based on your history, symptoms, physical examination, and diagnostic tests. Diagnostic tests for heart failure may include:  Echocardiography.  Electrocardiography.  Chest X-ray.  Blood tests.  Exercise stress test.  Cardiac angiography.  Radionuclide scans. TREATMENT  Treatment is aimed at managing the symptoms of heart failure. Medicines, behavioral changes, or surgical intervention may be necessary to treat heart failure.  Medicines to help treat heart failure may include:  Angiotensin-converting enzyme (ACE) inhibitors. This type of medicine blocks the effects of a blood protein called angiotensin-converting enzyme. ACE inhibitors relax (dilate) the blood vessels and help lower blood pressure.  Angiotensin receptor blockers (ARBs). This type of medicine blocks the actions of a blood protein called angiotensin. Angiotensin receptor blockers dilate the blood vessels and help lower blood pressure.  Water pills (diuretics). Diuretics cause the kidneys to remove salt and water from the blood. The extra fluid is removed through urination. This loss of extra fluid lowers the volume of blood the heart pumps.  Beta blockers. These prevent the heart from beating too fast and improve heart muscle strength.  Digitalis. This increases the force of the heartbeat.  Healthy behavior changes include:  Obtaining and maintaining a healthy weight.  Stopping smoking or chewing tobacco.  Eating heart-healthy foods.  Limiting or avoiding alcohol.  Stopping illicit drug use.  Physical activity as directed by your health care provider.  Surgical treatment for heart failure may include:  A procedure to open blocked arteries, repair damaged heart valves, or remove damaged heart muscle tissue.  A  pacemaker to improve heart muscle function and control certain abnormal heart rhythms.  An internal cardioverter defibrillator to treat certain serious abnormal heart rhythms.  A left ventricular assist device (LVAD) to assist the pumping ability of the heart. HOME CARE INSTRUCTIONS   Take medicines only as directed by your health care provider. Medicines are important in reducing the workload of your heart, slowing the progression of heart failure, and improving your symptoms.  Do not stop taking your medicine unless directed by your health care provider.  Do not skip any dose of medicine.  Refill your prescriptions before you run out of medicine. Your medicines are needed every day.  Engage in moderate physical activity if directed by your health care provider. Moderate physical activity can benefit some people. The elderly and people with severe heart failure should consult with a health care provider for physical activity recommendations.  Eat heart-healthy foods. Food choices should be free of trans fat and low in saturated fat, cholesterol, and salt (sodium). Healthy choices include fresh or frozen fruits and vegetables, fish, lean meats, legumes, fat-free or low-fat dairy products, and whole grain or high fiber foods. Talk to a dietitian to learn more about heart-healthy foods.  Limit sodium if directed by your health care provider. Sodium restriction may reduce symptoms of heart failure in some people. Talk to a dietitian to learn more about heart-healthy seasonings.  Use healthy cooking methods. Healthy cooking methods include roasting, grilling, broiling, baking,  poaching, steaming, or stir-frying. Talk to a dietitian to learn more about healthy cooking methods.  Limit fluids if directed by your health care provider. Fluid restriction may reduce symptoms of heart failure in some people.  Weigh yourself every day. Daily weights are important in the early recognition of excess fluid.  You should weigh yourself every morning after you urinate and before you eat breakfast. Wear the same amount of clothing each time you weigh yourself. Record your daily weight. Provide your health care provider with your weight record.  Monitor and record your blood pressure if directed by your health care provider.  Check your pulse if directed by your health care provider.  Lose weight if directed by your health care provider. Weight loss may reduce symptoms of heart failure in some people.  Stop smoking or chewing tobacco. Nicotine makes your heart work harder by causing your blood vessels to constrict. Do not use nicotine gum or patches before talking to your health care provider.  Keep all follow-up visits as directed by your health care provider. This is important.  Limit alcohol intake to no more than 1 drink per day for nonpregnant women and 2 drinks per day for men. One drink equals 12 ounces of beer, 5 ounces of wine, or 1 ounces of hard liquor. Drinking more than that is harmful to your heart. Tell your health care provider if you drink alcohol several times a week. Talk with your health care provider about whether alcohol is safe for you. If your heart has already been damaged by alcohol or you have severe heart failure, drinking alcohol should be stopped completely.  Stop illicit drug use.  Stay up-to-date with immunizations. It is especially important to prevent respiratory infections through current pneumococcal and influenza immunizations.  Manage other health conditions such as hypertension, diabetes, thyroid disease, or abnormal heart rhythms as directed by your health care provider.  Learn to manage stress.  Plan rest periods when fatigued.  Learn strategies to manage high temperatures. If the weather is extremely hot:  Avoid vigorous physical activity.  Use air conditioning or fans or seek a cooler location.  Avoid caffeine and alcohol.  Wear loose-fitting,  lightweight, and light-colored clothing.  Learn strategies to manage cold temperatures. If the weather is extremely cold:  Avoid vigorous physical activity.  Layer clothes.  Wear mittens or gloves, a hat, and a scarf when going outside.  Avoid alcohol.  Obtain ongoing education and support as needed.  Participate in or seek rehabilitation as needed to maintain or improve independence and quality of life. SEEK MEDICAL CARE IF:   You have a rapid weight gain.  You have increasing shortness of breath that is unusual for you.  You are unable to participate in your usual physical activities.  You tire easily.  You cough more than normal, especially with physical activity.  You have any or more swelling in areas such as your hands, feet, ankles, or abdomen.  You are unable to sleep because it is hard to breathe.  You feel like your heart is beating fast (palpitations).  You become dizzy or light-headed upon standing up. SEEK IMMEDIATE MEDICAL CARE IF:   You have difficulty breathing.  There is a change in mental status such as decreased alertness or difficulty with concentration.  You have a pain or discomfort in your chest.  You have an episode of fainting (syncope). MAKE SURE YOU:   Understand these instructions.  Will watch your condition.  Will get help  right away if you are not doing well or get worse.   This information is not intended to replace advice given to you by your health care provider. Make sure you discuss any questions you have with your health care provider.   Document Released: 03/31/2005 Document Revised: 08/15/2014 Document Reviewed: 04/30/2012 Elsevier Interactive Patient Education [employer].     If you need a refill on your cardiac medications before your next appointment, please call your pharmacy.

## 2015-07-16 NOTE — Progress Notes (Signed)
07/16/2015 Brandi Melton   July 03, 1930  [id number]  Primary Physician Pcp Not In System Primary Cardiologist: Dr. Harrington Challenger   Reason for Visit/CC: [facility] F/u for Acute on Chronic Diastolic CHF  HPI:  Brandi Melton presents to clinic today for post hospital f/u. She is followed by Dr. Harrington Challenger. She is a 82 y/o female, who recently moved from [place] to be closer to family. She resides at an ALF. Her PMH includes DM, HTN, OST and diastolic CHF. Cath in [facility] in Feb 2015 showed LVEF 65 to 70% with minimal CAD of LAD, RCA R and L filling pressure elevated at time She was hospitalised last fall in [place] with volume overload felt to be due to her noncompliance with meds.  She was recently seen by Dr. Harrington Challenger in clinic on 06/18/15. At that time she had marked edema with 1- 2+ edema to thigh. She was very SOBand Dr. Harrington Challenger recommended she continue on lasix. She also added 2.5 Zaroxyln to regimen MWF and stopped her aldactone. Micardis was cut in 1/2 to 20 mg. unfortunately, she failed outpatient diuretics and returned with persistent dyspnea. Still had edema in legswith renal insuff and Dr. Harrington Challenger recommended she come in to hosp for IV diuresis and close f/u of labs.   She was admitted for IV diuretics. Echo showed normal LVEF MIld diastolic dysfunction. BNP normal ( though obesity can effect). Held ARB due to elevated Scr.  She was given Heart failure education. She diuresed total negative of 3.4L with 3lb weight loss (237-->234lb). Scr continued to improved. SOB improved significantly. Her Hgb A1c was elevated at 8.5 but it was discovered that she was not taking her metformin at home. She was advised to restart at discharge along with insulin and f/u with her endocrinologist. She was discharged home on 07/05/15. Patient was instructed to continued daily lasix, 80 mg daily, accompanied with metolazone 2.5 mg 2x/week (Tuesdays and Fridays).   Today in clinic, she reports that she has done well. Breathing has improved. No  difficulties or limitations with ADLs. No orthopnea, PND or LEE. Her weight today, based on our office scale,is 240 lb (fully clothed with shoes). However she notes that her weight at home as been consistently around 230. She has changed her diet to reduce sodium intake. She wears compression stockings which she notes has helped prevent edema. She has been fully compliant with her diuretics. She is overall pleased with her progress.    Current Outpatient Prescriptions  Medication Sig Dispense Refill  . aspirin EC 81 MG tablet Take 81 mg by mouth daily.    . calcium carbonate (TUMS EX) 750 MG chewable tablet Chew 1 tablet by mouth daily.    . carvedilol (COREG CR) 80 MG 24 hr capsule Take 1 capsule (80 mg total) by mouth daily. 90 capsule 3  . chlorhexidine (PERIDEX) 0.12 % solution Use as directed 5 mLs in the mouth or throat 2 (two) times daily.     . cholecalciferol (VITAMIN D) 1000 units tablet Take 3,000 Units by mouth daily.    Marland Kitchen ezetimibe-simvastatin (VYTORIN) 10-40 MG per tablet Take 1 tablet by mouth daily.     . folic acid (FOLVITE) A999333 MCG tablet Take 400 mcg by mouth daily.    . furosemide (LASIX) 80 MG tablet Take 1 tablet (80 mg total) by mouth daily. 60 tablet 6  . insulin aspart (NOVOLOG) 100 UNIT/ML FlexPen Inject 20-25 Units into the skin 3 (three) times daily with meals. Inject  20 units subcutaneously with breakfast and lunch, inject 25 units with supper    . Insulin Detemir (LEVEMIR) 100 UNIT/ML Pen Inject 80 Units into the skin 2 (two) times daily.     . magnesium oxide (MAG-OX) 400 MG tablet Take 400 mg by mouth daily.    . metolazone (ZAROXOLYN) 2.5 MG tablet Take one tablet by mouth 30 minutes before taking Lasix on Tuesday and Friday 45 tablet 3  . oxyCODONE (OXY IR/ROXICODONE) 5 MG immediate release tablet Take 1 tablet (5 mg total) by mouth every 6 (six) hours as needed for moderate pain. 30 tablet 0  . potassium chloride SA (K-DUR,KLOR-CON) 20 MEQ tablet Take 20 mEq by  mouth daily.    Marland Kitchen telmisartan (MICARDIS) 20 MG tablet Take 20 mg by mouth daily.    . Vitamin D, Ergocalciferol, (DRISDOL) 50000 UNITS CAPS capsule Take 50,000 Units by mouth every Wednesday.      No current facility-administered medications for this visit.    No Known Allergies  Social History   Social History  . Marital Status: Widowed    Spouse Name: N/A  . Number of Children: N/A  . Years of Education: N/A   Occupational History  . Not on file.   Social History Main Topics  . Smoking status: Never Smoker   . Smokeless tobacco: Never Used  . Alcohol Use: No  . Drug Use: No  . Sexual Activity: Not on file   Other Topics Concern  . Not on file   Social History Narrative     Review of Systems: General: negative for chills, fever, night sweats or weight changes.  Cardiovascular: negative for chest pain, dyspnea on exertion, edema, orthopnea, palpitations, paroxysmal nocturnal dyspnea or shortness of breath Dermatological: negative for rash Respiratory: negative for cough or wheezing Urologic: negative for hematuria Abdominal: negative for nausea, vomiting, diarrhea, bright red blood per rectum, melena, or hematemesis Neurologic: negative for visual changes, syncope, or dizziness All other systems reviewed and are otherwise negative except as noted above.    Blood pressure 98/58, pulse 66, height 5\' 3"  (1.6 m), weight 240 lb (108.863 kg), SpO2 96 %.  General appearance: alert, cooperative and no distress, obese  Neck: no carotid bruit and no JVD Lungs: clear to auscultation bilaterally Heart: regular rate and rhythm, S1, S2 normal, no murmur, click, rub or gallop Extremities: no LEE, wearing compression stockings Pulses: 2+ and symmetric Skin: warm and dry Neurologic: Grossly normal  EKG not performed  ASSESSMENT AND PLAN:   1. Chronic Diastolic HF: euvolemic on exam today. No LEE.  No dyspnea, orthopnea or PND. Discharge weight was 234 lb. Her weight at home  has been consistently around 230 lb, based on her home scale.  Recent echo showed normal EF of 65-70% with G1DD.  Continue daily lasix, 80 mg daily, accompanied with metolazone 2.5 mg 2x/week (Tuesdays and Fridays). We will check a BMP today to assess renal function and K. We discussed importance of continuing daily weights, medication compliance and low sodium diet to prevent hospitalization. She was advised to call our office if >3lb weight gain in 24 hrs or >5lb weight gain in 1 week. Continue compression stockings.  2. HTN: Well controlled. Continue Coreg and Lasix. Telmisartan was discontinued given renal insufficiency. Continue low sodium diet.   3. DM: Her Hgb A1c was elevated at 8.5 but it was discovered that she was not taking her metformin at home. She was advised to restart at discharge along with insulin and  f/u with her endocrinologist.  PLAN  F/u with Dr. Harrington Challenger in 3-6 months.   Siddalee Vanderheiden PA-C 07/16/2015 9:00 AM

## 2015-07-18 DIAGNOSIS — Z6841 Body Mass Index (BMI) 40.0 and over, adult: Secondary | ICD-10-CM | POA: Diagnosis not present

## 2015-07-18 DIAGNOSIS — E1151 Type 2 diabetes mellitus with diabetic peripheral angiopathy without gangrene: Secondary | ICD-10-CM | POA: Diagnosis not present

## 2015-07-18 DIAGNOSIS — I10 Essential (primary) hypertension: Secondary | ICD-10-CM | POA: Diagnosis not present

## 2015-07-18 DIAGNOSIS — I5032 Chronic diastolic (congestive) heart failure: Secondary | ICD-10-CM | POA: Diagnosis not present

## 2015-07-18 DIAGNOSIS — N184 Chronic kidney disease, stage 4 (severe): Secondary | ICD-10-CM | POA: Diagnosis not present

## 2015-08-22 ENCOUNTER — Telehealth: Payer: Self-pay | Admitting: Internal Medicine

## 2015-08-22 DIAGNOSIS — R0602 Shortness of breath: Secondary | ICD-10-CM

## 2015-08-22 NOTE — Telephone Encounter (Signed)
I spoke with pt's son in law. He reports he and his wife noticed pt has been more short of breath with limited activity recently. Noticed this when pt was at their house this past Sunday. Thinks it has been going on for about a week. He states she has some swelling in feet and ankles and he thinks this has increased recently. Pt has appt with Dr. Harrington Challenger at the end of the month but son in law thinks she needs evaluation prior to this. Pt weighs daily but he does not know weights and does not know her medications. Pt resides in Assisted living/independent living at [place]. He states she probably has not been compliant with her diet.  He reports symptoms are not as severe as prior to hospitalization earlier this year.  He asked me to call her to discuss her symptoms. He asked if we did not reach her today to leave message and try to reach her tomorrow morning.  I placed call to pt and left message to call back

## 2015-08-22 NOTE — Telephone Encounter (Signed)
Spoke with pt. She reports shortness of breath with activity. Back to normal with rest. Wears compression stockings and thinks she has had slight increase in swelling.  Weight today 237 lbs.  She states weight stays between 236-238 lbs.  Is on Lasix 80 daily and metolazone 2.5 mg on Tuesday and Friday.  Will review with provider in office. Pt states she is going down to dinner and if we do not reach her today to please call her in the morning.

## 2015-08-22 NOTE — Telephone Encounter (Signed)
Pt c/o Shortness Of Breath: STAT if SOB developed within the last 24 hours or pt is noticeably SOB on the phone  1. Are you currently SOB (can you hear that pt is SOB on the phone)? no  2. How long have you been experiencing SOB? Over 5 days  3. Are you SOB when sitting or when up moving around? W/ walking   4. Are you currently experiencing any other symptoms? no

## 2015-08-22 NOTE — Telephone Encounter (Signed)
Reviewed with Dr. Caryl Comes.  If current dose of metolazone is working and she is diuresing more on days she is taking it she should increase this to 2.5 mg 3 days per week. She can start on Thursday and then take Saturday, Tuesday and Thursday.  If she is not diuresing well on metolazone she should increase it to 5 mg twice per week on Thursday and Sunday. Will need BMP next week.  Keep scheduled follow up with Dr. Harrington Challenger on May 26th but let us know if she does not improve. I placed call to pt but she is unable to take instructions at this time.  She will call us early tomorrow morning before taking AM medications.

## 2015-08-23 MED ORDER — METOLAZONE 2.5 MG PO TABS: ORAL_TABLET | ORAL | Status: DC

## 2015-08-23 NOTE — Telephone Encounter (Signed)
I spoke with the patient. She does have more diuresis on days that she takes metolazone, so I adv her to increase to 2.5 mg three times per week starting today. She will take it today, Saturday, Tuesday and Thursday.  She will come for BMET next Thursday 5/18.  She verbalizes understanding with this plan. She will see Dr. Harrington Challenger on 5/26.

## 2015-08-30 ENCOUNTER — Other Ambulatory Visit (INDEPENDENT_AMBULATORY_CARE_PROVIDER_SITE_OTHER): Payer: Medicare Other | Admitting: *Deleted

## 2015-08-30 DIAGNOSIS — R0602 Shortness of breath: Secondary | ICD-10-CM

## 2015-08-30 LAB — BASIC METABOLIC PANEL
BUN: 34 mg/dL — AB (ref 7–25)
CALCIUM: 9.1 mg/dL (ref 8.6–10.4)
CO2: 28 mmol/L (ref 20–31)
CREATININE: 1.32 mg/dL — AB (ref 0.60–0.88)
Chloride: 104 mmol/L (ref 98–110)
GLUCOSE: 135 mg/dL — AB (ref 65–99)
POTASSIUM: 4 mmol/L (ref 3.5–5.3)
Sodium: 142 mmol/L (ref 135–146)

## 2015-08-30 NOTE — Addendum Note (Signed)
Addended by: Eulis Foster on: 08/30/2015 08:41 AM   Modules accepted: Orders

## 2015-09-05 NOTE — Progress Notes (Signed)
Cardiology Office Note   Date:  09/07/2015   ID:  Brandi Melton, DOB [birth year], MRN [id number]  PCP:  Pcp Not In System  Cardiologist:   Dorris Carnes, MD   F/U of diastolic CHF   History of Present Illness: Brandi Melton is a 82 y.o. female with a history of Diastolic CHF  She was admitted in April for SOB, weight gain  She was diuresed  She has a history of HTN, DM,  Cath in [place] in 2015 LVEF normal with minimal CAD  Increased filling pressures.   Wt in office was 240  At home 230   She was seen by B Rosita Fire on April 3  Doing good at the time  She says that she is now more SOB with activity  Feels swollen  Wt up to 240 at home   Says that she eats a soup every day  Occasional pickles.    Called office on 5/10 withh increased SOB  Toke to increas Metalozone to  2.5 mg 3x per wk  Keep on lasix  (? 80)  She had labs drawn on 5/18   BMET Cr 1.32     Outpatient Prescriptions Prior to Visit  Medication Sig Dispense Refill  . aspirin EC 81 MG tablet Take 81 mg by mouth daily.    . calcium carbonate (TUMS EX) 750 MG chewable tablet Chew 1 tablet by mouth daily.    . carvedilol (COREG CR) 80 MG 24 hr capsule Take 1 capsule (80 mg total) by mouth daily. 90 capsule 3  . chlorhexidine (PERIDEX) 0.12 % solution Use as directed 5 mLs in the mouth or throat 2 (two) times daily.     . cholecalciferol (VITAMIN D) 1000 units tablet Take 3,000 Units by mouth daily.    Marland Kitchen ezetimibe-simvastatin (VYTORIN) 10-40 MG per tablet Take 1 tablet by mouth daily.     . folic acid (FOLVITE) A999333 MCG tablet Take 400 mcg by mouth daily.    . furosemide (LASIX) 80 MG tablet Take 1 tablet (80 mg total) by mouth daily. 60 tablet 6  . insulin aspart (NOVOLOG) 100 UNIT/ML FlexPen Inject 20-25 Units into the skin 3 (three) times daily with meals. Inject 20 units subcutaneously with breakfast and lunch, inject 25 units with supper    . Insulin Detemir (LEVEMIR) 100 UNIT/ML Pen Inject 80 Units into the skin 2 (two)  times daily.     . magnesium oxide (MAG-OX) 400 MG tablet Take 400 mg by mouth daily.    . metolazone (ZAROXOLYN) 2.5 MG tablet Take one tablet by mouth 30 minutes before taking Lasix on Tuesday Thursday and Saturday 45 tablet 3  . potassium chloride SA (K-DUR,KLOR-CON) 20 MEQ tablet Take 20 mEq by mouth daily.    Marland Kitchen telmisartan (MICARDIS) 20 MG tablet Take 20 mg by mouth daily.    . Vitamin D, Ergocalciferol, (DRISDOL) 50000 UNITS CAPS capsule Take 50,000 Units by mouth every Wednesday.     Marland Kitchen oxyCODONE (OXY IR/ROXICODONE) 5 MG immediate release tablet Take 1 tablet (5 mg total) by mouth every 6 (six) hours as needed for moderate pain. (Patient not taking: Reported on 09/07/2015) 30 tablet 0   No facility-administered medications prior to visit.     Allergies:   Review of patient's allergies indicates no known allergies.   Past Medical History  Diagnosis Date  . Diabetes mellitus without complication ([place])   . Hypertension   . Hyperlipemia   . Depression   .  Myocardial infarction [facility])     Maybe a small one- unaware when  . Shortness of breath dyspnea     with exertion  . CHF (congestive heart failure) ([place])   . Sleep apnea   . Palsy, Bell's   . RA (rheumatoid arthritis) [facility])     Past Surgical History  Procedure Laterality Date  . Joint replacement Bilateral     Hips  . Knee arthroplasty Left   . Cholecystectomy    . Cardiac catheterization  2013ish  . Colonoscopy    . Wrist fracture surgery Right 08/08/2014  . Orif wrist fracture Right 08/08/2014    Procedure: Right OPEN REDUCTION INTERNAL FIXATION (ORIF) WRIST FRACTURE WITH REPAIR AND RECONST/ALLOGRAFT AND BONE GRAFT;  Surgeon: Roseanne Kaufman, MD;  Location: [facility];  Service: Orthopedics;  Laterality: Right;  . Carpal tunnel release Right 08/08/2014    Procedure: OPEN CARPAL TUNNEL RELEASE;  Surgeon: Roseanne Kaufman, MD;  Location: [place];  Service: Orthopedics;  Laterality: Right;     Social History:  The patient  reports  that she has never smoked. She has never used smokeless tobacco. She reports that she does not drink alcohol or use illicit drugs.   Family History:  The patient's family history is not on file.    ROS:  Please see the history of present illness. All other systems are reviewed and  Negative to the above problem except as noted.    PHYSICAL EXAM: VS:  BP 130/64 mmHg  Pulse 72  Ht 5\' 3"  (1.6 m)  Wt 250 lb 6.4 oz (113.581 kg)  BMI 44.37 kg/m2  GEN: Morbidly obese 82 yo  in no acute distress HEENT: normal Neck:  JVP is diffiuclt to assess Cardiac: RRR; no murmurs, rubs, or gallops, 1+ edema  Respiratory:  clear to auscultation bilaterally, normal work of breathing GI: soft, nontender, nondistended, + BS  No hepatomegaly  MS: no deformity Moving all extremities   Skin: warm and dry, no rash Neuro:  Strength and sensation are intact Psych: euthymic mood, full affect   EKG:  EKG is not  ordered today.   Lipid Panel No results found for: CHOL, TRIG, HDL, CHOLHDL, VLDL, LDLCALC, LDLDIRECT    Wt Readings from Last 3 Encounters:  09/07/15 250 lb 6.4 oz (113.581 kg)  07/16/15 240 lb (108.863 kg)  07/05/15 234 lb 3.2 oz (106.232 kg)      ASSESSMENT AND PLAN:  1  Diastolic CHF  Acute on chronic  Volume is increased on exam  I think she is getting too much salt  Discussed soup and pickles   She says that kitchen at [employer] does not accomodate low salt  WIll check BMT again and decide on further dosing of diuretic      Signed, Dorris Carnes, MD  09/07/2015 10:24 AM    [address], [place], [place]  [address] Phone: [phone]; Fax: [phone]

## 2015-09-07 ENCOUNTER — Encounter: Payer: Self-pay | Admitting: Internal Medicine

## 2015-09-07 ENCOUNTER — Ambulatory Visit (INDEPENDENT_AMBULATORY_CARE_PROVIDER_SITE_OTHER): Payer: Medicare Other | Admitting: Internal Medicine

## 2015-09-07 VITALS — BP 130/64 | HR 72 | Ht 63.0 in | Wt 250.4 lb

## 2015-09-07 DIAGNOSIS — I10 Essential (primary) hypertension: Secondary | ICD-10-CM | POA: Diagnosis not present

## 2015-09-07 LAB — BASIC METABOLIC PANEL
BUN: 28 mg/dL — AB (ref 7–25)
CALCIUM: 9.6 mg/dL (ref 8.6–10.4)
CO2: 31 mmol/L (ref 20–31)
Chloride: 102 mmol/L (ref 98–110)
Creat: 1.52 mg/dL — ABNORMAL HIGH (ref 0.60–0.88)
GLUCOSE: 55 mg/dL — AB (ref 65–99)
POTASSIUM: 4 mmol/L (ref 3.5–5.3)
Sodium: 144 mmol/L (ref 135–146)

## 2015-09-07 NOTE — Patient Instructions (Signed)
Your physician recommends that you continue on your current medications as directed. Please refer to the Current Medication list given to you today. Your physician recommends that you return for lab work in: [place]

## 2015-09-14 ENCOUNTER — Telehealth: Payer: Self-pay | Admitting: Internal Medicine

## 2015-09-14 DIAGNOSIS — R7989 Other specified abnormal findings of blood chemistry: Secondary | ICD-10-CM

## 2015-09-14 DIAGNOSIS — I5033 Acute on chronic diastolic (congestive) heart failure: Secondary | ICD-10-CM

## 2015-09-14 NOTE — Telephone Encounter (Signed)
Informed pt of lab results. Pt verbalized understanding. Labs scheduled for 6/19.

## 2015-09-14 NOTE — Telephone Encounter (Signed)
New message      Pt states she was instructed to call this morning for a "message".

## 2015-10-01 ENCOUNTER — Telehealth: Payer: Self-pay | Admitting: Internal Medicine

## 2015-10-01 ENCOUNTER — Other Ambulatory Visit (INDEPENDENT_AMBULATORY_CARE_PROVIDER_SITE_OTHER): Payer: Medicare Other | Admitting: *Deleted

## 2015-10-01 DIAGNOSIS — R748 Abnormal levels of other serum enzymes: Secondary | ICD-10-CM | POA: Diagnosis not present

## 2015-10-01 DIAGNOSIS — I5033 Acute on chronic diastolic (congestive) heart failure: Secondary | ICD-10-CM | POA: Diagnosis not present

## 2015-10-01 DIAGNOSIS — R7989 Other specified abnormal findings of blood chemistry: Secondary | ICD-10-CM | POA: Diagnosis not present

## 2015-10-01 DIAGNOSIS — I509 Heart failure, unspecified: Secondary | ICD-10-CM | POA: Diagnosis not present

## 2015-10-01 LAB — BASIC METABOLIC PANEL
BUN: 35 mg/dL — ABNORMAL HIGH (ref 7–25)
CHLORIDE: 101 mmol/L (ref 98–110)
CO2: 33 mmol/L — ABNORMAL HIGH (ref 20–31)
CREATININE: 1.19 mg/dL — AB (ref 0.60–0.88)
Calcium: 9.1 mg/dL (ref 8.6–10.4)
Glucose, Bld: 96 mg/dL (ref 65–99)
POTASSIUM: 3.8 mmol/L (ref 3.5–5.3)
Sodium: 141 mmol/L (ref 135–146)

## 2015-10-01 LAB — BRAIN NATRIURETIC PEPTIDE: BRAIN NATRIURETIC PEPTIDE: 45.6 pg/mL (ref <100)

## 2015-10-01 NOTE — Addendum Note (Signed)
Addended by: Eulis Foster on: 10/01/2015 08:45 AM   Modules accepted: Orders

## 2015-10-01 NOTE — Telephone Encounter (Signed)
New Message  Pt son-in-law calling to speak w/ RN about lab work done today 6/19. Please call back and discuss.

## 2015-10-01 NOTE — Telephone Encounter (Signed)
Called to speak with Meade Maw, he was unable to get on the phone but wanted to know if patient had labs and sent home or sent to hospital. He asked that I call back in about 30 min to talk to him.  He is working and will be available to talk then.

## 2015-10-01 NOTE — Telephone Encounter (Signed)
Spoke with patient's son in law; he is concerned that she is "inching toward needing an intervention", referring to her recent hospitalization for diuresis.  He states she is weaker and is getting SOB quicker than her normal.  He knows she is not compliant with her diet. He is not sure of her weights but know she is to be weighing daily.  He asked that when her lab results are in--that we contact him directly with them.  Then he would review with her.   He asked that patient be seen soon, even before the lab results are in.  I scheduled her this Friday with Dr. Harrington Challenger.   There is DPR on file to speak with him and his wife. Forwarding to Dr. Harrington Challenger to inform.

## 2015-10-02 NOTE — Telephone Encounter (Signed)
Follow up   Pt is calling back to speak to rn he stated she is very displeased that she has not received a call back  And she wont be able t get to a phone now until the am  She is requesting that the rn call her back in the morning

## 2015-10-02 NOTE — Telephone Encounter (Signed)
I have not received lab results for this patient from MD yet.  Dr. Harrington Challenger has attempted to contact Brandi Melton, at his request.  She was unable to leave a message.  She is planning to call him back. Will route this message to her.

## 2015-10-02 NOTE — Telephone Encounter (Signed)
New message   Pt verbalized that she is returning call from rn  And that she believes it was for blood work

## 2015-10-04 ENCOUNTER — Telehealth: Payer: Self-pay | Admitting: Internal Medicine

## 2015-10-04 NOTE — Telephone Encounter (Signed)
New message   Pt son-n-law calling again for lab results

## 2015-10-04 NOTE — Telephone Encounter (Signed)
SPOKE WITH PT'S SON IN LAW, RE: LABS  WHICH  APPEAR STABLE  BUT  OFFICIALLY NOT   REVIEWED . WILL FORWARD MESSAGE  TO  DR  ROSS  AS  SON IN LAW  REQUESTS  F/U APPT FOR   PT.   PER  PT'S DAUGHTER  OVER   2 WEEKS HAS NOTED  INCREASE IN SOB  AND  FATIGUE .CY

## 2015-10-04 NOTE — Telephone Encounter (Signed)
I have left voicemails on pt's machine today and earlier this week  Labs stable   She again did have clinic appt on Fri that appears to have been cancelled  I can see her if she sets up a time  Got through to son in laws voicemail  Not able to leave msg earlier in wk (machine full) Stated labs OK   Again, I can see pt on Fr to review lasix dosing and also diet.  Call office beforehand to set up time

## 2015-10-05 ENCOUNTER — Ambulatory Visit: Payer: Medicare Other | Admitting: Internal Medicine

## 2015-10-05 NOTE — Telephone Encounter (Signed)
Pt's son n law Dr. Meade Maw calling to say he got your message and will get in touch with you next week

## 2015-10-08 ENCOUNTER — Ambulatory Visit (INDEPENDENT_AMBULATORY_CARE_PROVIDER_SITE_OTHER): Payer: Medicare Other | Admitting: Cardiology

## 2015-10-08 ENCOUNTER — Encounter: Payer: Self-pay | Admitting: Cardiology

## 2015-10-08 VITALS — BP 106/60 | HR 70 | Ht 63.0 in | Wt 248.8 lb

## 2015-10-08 DIAGNOSIS — Z5189 Encounter for other specified aftercare: Secondary | ICD-10-CM

## 2015-10-08 MED ORDER — METOLAZONE 2.5 MG PO TABS: 2.5000 mg | ORAL_TABLET | Freq: Every day | ORAL | Status: DC

## 2015-10-08 MED ORDER — POTASSIUM CHLORIDE CRYS ER 20 MEQ PO TBCR: 40.0000 meq | EXTENDED_RELEASE_TABLET | Freq: Every day | ORAL | Status: DC

## 2015-10-08 NOTE — Patient Instructions (Addendum)
Medication Instructions:   START TAKING METOLAZONE ONCE A DAY 30 MINUTES BEFORE LASIX DOSE FOR  A WEEK   START TAKING POTASSIUM 40 MEQ ( 2 TABLETS OF 20 MEQ ) ONCE A DAY   If you need a refill on your cardiac medications before your next appointment, please call your pharmacy.  Labwork: RETURN IN IN ONE WEEK FOR REPEAT BMET    Testing/Procedures:NONE ORDER TODAY    Follow-Up: WITH ROSS OR AVAILABLE APP IN 2 TO 3 WEEKS    Any Other Special Instructions Will Be Listed Below (If Applicable).

## 2015-10-08 NOTE — Progress Notes (Signed)
not  10/08/2015 Brandi Melton   03-04-1931  [id number]  Primary Physician Pcp Not In System Primary Cardiologist: Dr. Harrington Challenger   Reason for Visit/CC: f/u for Chronic Diastolic CHF  HPI:  82 y.o. female with a history of Diastolic CHF. She is followed by Dr. Harrington Challenger. She was admitted in April of this year for SOB and weight gain 2/2 acute on chronic diastolic CHF. She was diuresed and symptoms improved. Dry weight was 234 lb. 2D echo showed normal LVEF of 65-70%. She also has a h/o HTN and DM. She recently moved from [facility] several months ago. Per records, she had a Cath in [place] in 2015 that showed only minimal CAD with increased filling pressures.   She was recently seen in clinic by Dr. Harrington Challenger on 08/2015. She complained of increased exertional dyspnea and weight gain. Office weight was 250 lb. She admitted to dietary indiscretion with sodium. She admitted to eating canned soup daily and occasional pickles. She was instructed to continue lasix and her metolazone was increased to 2.5 mg 3x per week. Repeat  BMP 6/19 showed stable renal function. Scr was 1.19 and K was 3.8. She now presents to clinic for f/u.   She only notes mild improvement in her breathing. She still notes mild exertional dyspnea. But she does admit that she tries to walk at a fast paste than she should. She also does not get much exercise. She has started wearing compressions stockings which has helped her LEE, however she notes she is now retaining more fluid in her abdomen. She has also adjusted her diet. She switched from salad dressing to a vinaigrette. She cut out pickles. Now only eating soup that is low sodium. She denies CP. Office weight today is 248 lb.    Current Outpatient Prescriptions  Medication Sig Dispense Refill  . aspirin EC 81 MG tablet Take 81 mg by mouth daily.    . calcium carbonate (TUMS EX) 750 MG chewable tablet Chew 1 tablet by mouth daily.    . carvedilol (COREG CR) 80 MG 24 hr capsule Take 1 capsule (80 mg  total) by mouth daily. 90 capsule 3  . chlorhexidine (PERIDEX) 0.12 % solution Use as directed 5 mLs in the mouth or throat 2 (two) times daily.     . cholecalciferol (VITAMIN D) 1000 units tablet Take 3,000 Units by mouth daily.    Marland Kitchen ezetimibe-simvastatin (VYTORIN) 10-40 MG per tablet Take 1 tablet by mouth daily.     . folic acid (FOLVITE) A999333 MCG tablet Take 400 mcg by mouth daily.    . furosemide (LASIX) 80 MG tablet Take 1 tablet (80 mg total) by mouth daily. 60 tablet 6  . insulin aspart (NOVOLOG) 100 UNIT/ML injection Inject 30 Units into the skin 4 (four) times daily.    . Insulin Detemir (LEVEMIR) 100 UNIT/ML Pen Inject 80 Units into the skin 2 (two) times daily.     . magnesium oxide (MAG-OX) 400 MG tablet Take 400 mg by mouth daily.    . metolazone (ZAROXOLYN) 2.5 MG tablet Take 1 tablet (2.5 mg total) by mouth daily. Take one tablet by mouth 30 minutes before taking Lasix 45 tablet 3  . potassium chloride SA (K-DUR,KLOR-CON) 20 MEQ tablet Take 2 tablets (40 mEq total) by mouth daily. 60 tablet 6  . telmisartan (MICARDIS) 20 MG tablet Take 20 mg by mouth daily.    . Vitamin D, Ergocalciferol, (DRISDOL) 50000 UNITS CAPS capsule Take 50,000 Units by mouth every Wednesday.  No current facility-administered medications for this visit.    No Known Allergies  Social History   Social History  . Marital Status: Widowed    Spouse Name: N/A  . Number of Children: N/A  . Years of Education: N/A   Occupational History  . Not on file.   Social History Main Topics  . Smoking status: Never Smoker   . Smokeless tobacco: Never Used  . Alcohol Use: No  . Drug Use: No  . Sexual Activity: Not on file   Other Topics Concern  . Not on file   Social History Narrative     Review of Systems: General: negative for chills, fever, night sweats or weight changes.  Cardiovascular: negative for chest pain, dyspnea on exertion, edema, orthopnea, palpitations, paroxysmal nocturnal dyspnea  or shortness of breath Dermatological: negative for rash Respiratory: negative for cough or wheezing Urologic: negative for hematuria Abdominal: negative for nausea, vomiting, diarrhea, bright red blood per rectum, melena, or hematemesis Neurologic: negative for visual changes, syncope, or dizziness All other systems reviewed and are otherwise negative except as noted above.    Blood pressure 106/60, pulse 70, height 5\' 3"  (1.6 m), weight 248 lb 12.8 oz (112.855 kg).  General appearance: alert, cooperative, no distress and elderly and obsese Neck: no carotid bruit and no JVD Lungs: clear to auscultation bilaterally Heart: regular rate and rhythm, S1, S2 normal, no murmur, click, rub or gallop Extremities: no LEE, wearing compression stockings Pulses: 2+ and symmetric Skin: warm and dry Neurologic: Grossly normal  EKG not performed  ASSESSMENT AND PLAN:   1. Chronic Diastolic HF: still with mild exertional dyspnea and fluid retention. Her office weight today is 248 lb (Dry weight after discharge 07/2015 was 234 lb). She denies any resting dyspnea. She notes only mild improvement with increase in metolazone to 3x/week. BMP 6/19 showed normal renal function and K. We will increase metolazone to daily use x 1 week, 30 min prior to lasix dose. Increase K-Dur to 40 mEq daily. Continue sodium restriction and compression stockings. Repeat BMP in 1 week. F/u with Dr. Harrington Challenger or App in 2 weeks.    Lyda Jester PA-C 10/08/2015 11:35 AM

## 2015-10-17 ENCOUNTER — Other Ambulatory Visit (INDEPENDENT_AMBULATORY_CARE_PROVIDER_SITE_OTHER): Payer: Medicare Other | Admitting: *Deleted

## 2015-10-17 DIAGNOSIS — Z5189 Encounter for other specified aftercare: Secondary | ICD-10-CM | POA: Diagnosis not present

## 2015-10-17 LAB — BASIC METABOLIC PANEL
BUN: 35 mg/dL — AB (ref 7–25)
CALCIUM: 9.2 mg/dL (ref 8.6–10.4)
CHLORIDE: 102 mmol/L (ref 98–110)
CO2: 29 mmol/L (ref 20–31)
CREATININE: 1.39 mg/dL — AB (ref 0.60–0.88)
Glucose, Bld: 142 mg/dL — ABNORMAL HIGH (ref 65–99)
Potassium: 4 mmol/L (ref 3.5–5.3)
Sodium: 142 mmol/L (ref 135–146)

## 2015-10-17 NOTE — Addendum Note (Signed)
Addended by: Eulis Foster on: 10/17/2015 08:44 AM   Modules accepted: Orders

## 2015-10-24 ENCOUNTER — Ambulatory Visit (INDEPENDENT_AMBULATORY_CARE_PROVIDER_SITE_OTHER): Payer: Medicare Other | Admitting: Physician Assistant

## 2015-10-24 ENCOUNTER — Encounter: Payer: Self-pay | Admitting: Physician Assistant

## 2015-10-24 VITALS — BP 110/80 | HR 63 | Ht 63.0 in | Wt 251.0 lb

## 2015-10-24 DIAGNOSIS — E785 Hyperlipidemia, unspecified: Secondary | ICD-10-CM | POA: Diagnosis not present

## 2015-10-24 DIAGNOSIS — I5033 Acute on chronic diastolic (congestive) heart failure: Secondary | ICD-10-CM

## 2015-10-24 DIAGNOSIS — I10 Essential (primary) hypertension: Secondary | ICD-10-CM | POA: Diagnosis not present

## 2015-10-24 DIAGNOSIS — G4733 Obstructive sleep apnea (adult) (pediatric): Secondary | ICD-10-CM

## 2015-10-24 DIAGNOSIS — I2510 Atherosclerotic heart disease of native coronary artery without angina pectoris: Secondary | ICD-10-CM | POA: Diagnosis not present

## 2015-10-24 LAB — BASIC METABOLIC PANEL
BUN: 43 mg/dL — AB (ref 7–25)
CHLORIDE: 105 mmol/L (ref 98–110)
CO2: 30 mmol/L (ref 20–31)
Calcium: 9.7 mg/dL (ref 8.6–10.4)
Creat: 1.64 mg/dL — ABNORMAL HIGH (ref 0.60–0.88)
Glucose, Bld: 46 mg/dL — ABNORMAL LOW (ref 65–99)
Potassium: 4.2 mmol/L (ref 3.5–5.3)
Sodium: 142 mmol/L (ref 135–146)

## 2015-10-24 MED ORDER — FUROSEMIDE 80 MG PO TABS: ORAL_TABLET | ORAL | Status: DC

## 2015-10-24 NOTE — Progress Notes (Signed)
Cardiology Office Note    Date:  10/24/2015   ID:  Ayslinn, Groshek [birth year], MRN [id number]  PCP:  Pcp Not In System  Cardiologist:  Dr. Harrington Challenger   Chief Complaint  Patient presents with  . Follow-up    seen for Dr. Harrington Challenger in flex clinic    History of Present Illness:  Brandi Melton is a 82 y.o. female with PMH of diastolic HF, HTN, HLD, OSA, CAD, RA and h/o bell's palsy. She had a cardiac catheterization from [place] in 05/2013 that showed only minimal CAD with increased filling pressure. She was admitted in March 2017 for shortness of breath and weight gain secondary to acute on chronic diastolic heart failure. Her dry weight on discharge was 234 pounds. Echocardiogram obtained on 07/03/2015 showed EF [id number], grade 1 diastolic dysfunction. She was seen by Dr. Harrington Challenger on 09/07/2015, at which time she complained of exertional dyspnea and weight gain. Office weight was 250 pounds. She had a dietary indiscretion. She was instructed to continue Lasix and her metolazone was increased to 2.5 mg 3 times per week. She was last seen in the clinic on 10/08/2015 as follow-up at which time she still has some mild exertional dyspnea and fluid retention. Her office weight was 248 pounds. Her metolazone was increased to daily 1 week given 30 minutes prior to Lasix dose. The symptoms supplement was increased accordingly. Repeat basic metabolic panel obtained on 10/17/2015 shows creatinine 1.39 slightly increased from previous 1.19. Potassium 4.0.   She presents today for office follow-up. Her weight today is 251 pounds, she has gained 3 pounds since last visit. She says when she started on the one week regimen of metolazone, she did lose some weight, however after converting back to 3 times weekly metolazone, she quickly came them right back. She is frustrated with her fluid issues. She says she does not understand why she is gaining the fluid back. She says she does not drink too much fluid and generally under 2 L per  day. She has not put salt in her food for long time. She has been compliant with her medications. She also has problem measuring her weight at home as her scale is in kg not in pounds and she has a difficult time converting the number. I have told her not to worry about conversion at home, if she can obtain the weight in kilograms that's fine with me, we can always convert in the office. Obtain daily weights for allowing [place] to assess her fluid changes at home. I think at least a part of her swelling is related to venous insufficiency. She has been elevating her legs at home and use compression stocking. We will do a trial of increasing the Lasix to 80 mg in a.m. and 40 mg in p.m. We will obtain basic metabolic panel today and repeat basic metabolic panel in one week to assess her renal function. I did not obtain BNP as her previous BNP on 6/19 was normal. Due to her morbid obesity, she can have falsely normal BNP despite being fluid overloaded. We will see her back in 2-3 weeks. We may also consider spironolactone at a later time if her BP is stable. Ultimately, her issue with fluid would be easier to manage with weight loss.     Past Medical History  Diagnosis Date  . Diabetes mellitus without complication ([place])   . Hypertension   . Hyperlipemia   . Depression   . Myocardial infarction ([place])  Maybe a small one- unaware when  . Shortness of breath dyspnea     with exertion  . CHF (congestive heart failure) ([place])   . Sleep apnea   . Palsy, Bell's   . RA (rheumatoid arthritis) [facility])     Past Surgical History  Procedure Laterality Date  . Joint replacement Bilateral     Hips  . Knee arthroplasty Left   . Cholecystectomy    . Cardiac catheterization  2013ish  . Colonoscopy    . Wrist fracture surgery Right 08/08/2014  . Orif wrist fracture Right 08/08/2014    Procedure: Right OPEN REDUCTION INTERNAL FIXATION (ORIF) WRIST FRACTURE WITH REPAIR AND RECONST/ALLOGRAFT AND BONE GRAFT;  Surgeon:  Roseanne Kaufman, MD;  Location: [facility];  Service: Orthopedics;  Laterality: Right;  . Carpal tunnel release Right 08/08/2014    Procedure: OPEN CARPAL TUNNEL RELEASE;  Surgeon: Roseanne Kaufman, MD;  Location: [facility];  Service: Orthopedics;  Laterality: Right;    Current Medications: Outpatient Prescriptions Prior to Visit  Medication Sig Dispense Refill  . aspirin EC 81 MG tablet Take 81 mg by mouth daily.    . calcium carbonate (TUMS EX) 750 MG chewable tablet Chew 1 tablet by mouth daily.    . carvedilol (COREG CR) 80 MG 24 hr capsule Take 1 capsule (80 mg total) by mouth daily. 90 capsule 3  . chlorhexidine (PERIDEX) 0.12 % solution Use as directed 5 mLs in the mouth or throat 2 (two) times daily.     . cholecalciferol (VITAMIN D) 1000 units tablet Take 3,000 Units by mouth daily.    Marland Kitchen ezetimibe-simvastatin (VYTORIN) 10-40 MG per tablet Take 1 tablet by mouth daily.     . folic acid (FOLVITE) A999333 MCG tablet Take 400 mcg by mouth daily.    . insulin aspart (NOVOLOG) 100 UNIT/ML injection Inject 30 Units into the skin 4 (four) times daily.    . Insulin Detemir (LEVEMIR) 100 UNIT/ML Pen Inject 80 Units into the skin 2 (two) times daily.     . magnesium oxide (MAG-OX) 400 MG tablet Take 400 mg by mouth daily.    Marland Kitchen telmisartan (MICARDIS) 20 MG tablet Take 20 mg by mouth daily.    . Vitamin D, Ergocalciferol, (DRISDOL) 50000 UNITS CAPS capsule Take 50,000 Units by mouth every Wednesday.     . furosemide (LASIX) 80 MG tablet Take 1 tablet (80 mg total) by mouth daily. 60 tablet 6  . metolazone (ZAROXOLYN) 2.5 MG tablet Take 1 tablet (2.5 mg total) by mouth daily. Take one tablet by mouth 30 minutes before taking Lasix (Patient not taking: Reported on 10/24/2015) 45 tablet 3  . potassium chloride SA (K-DUR,KLOR-CON) 20 MEQ tablet Take 2 tablets (40 mEq total) by mouth daily. (Patient not taking: Reported on 10/24/2015) 60 tablet 6   No facility-administered medications prior to visit.     Allergies:    Review of patient's allergies indicates no known allergies.   Social History   Social History  . Marital Status: Widowed    Spouse Name: N/A  . Number of Children: N/A  . Years of Education: N/A   Social History Main Topics  . Smoking status: Never Smoker   . Smokeless tobacco: Never Used  . Alcohol Use: No  . Drug Use: No  . Sexual Activity: Not Asked   Other Topics Concern  . None   Social History Narrative     Family History:  The patient's family history includes Stroke in her mother; Sudden  death in her mother. There is no history of Hypertension.   ROS:   Please see the history of present illness.    ROS All other systems reviewed and are negative.   PHYSICAL EXAM:   VS:  BP 110/80 mmHg  Pulse 63  Ht 5\' 3"  (1.6 m)  Wt 251 lb (113.853 kg)  BMI 44.47 kg/m2   GEN: Well nourished, well developed, in no acute distress HEENT: normal Neck: no JVD, carotid bruits, or masses Cardiac: RRR; no murmurs, rubs, or gallops. Largely nonpitting edema Respiratory:  clear to auscultation bilaterally, normal work of breathing GI: soft, nontender, nondistended, + BS MS: no deformity or atrophy Skin: warm and dry, no rash Neuro:  Alert and Oriented x 3, Strength and sensation are intact Psych: euthymic mood, full affect  Wt Readings from Last 3 Encounters:  10/24/15 251 lb (113.853 kg)  10/08/15 248 lb 12.8 oz (112.855 kg)  09/07/15 250 lb 6.4 oz (113.581 kg)      Studies/Labs Reviewed:   EKG:  EKG is not ordered today.    Recent Labs: 07/02/2015: ALT 25; Hemoglobin 11.3*; Platelets 196; TSH 2.212 10/01/2015: Brain Natriuretic Peptide 45.6 10/17/2015: BUN 35*; Creat 1.39*; Potassium 4.0; Sodium 142   Lipid Panel No results found for: CHOL, TRIG, HDL, CHOLHDL, VLDL, LDLCALC, LDLDIRECT  Additional studies/ records that were reviewed today include:  Previous cath 05/2013 minimal CAD, normal EF  Echo 07/03/2015 LV EF: 65% -  70%  ------------------------------------------------------------------- Indications: CHF - 428.0.  ------------------------------------------------------------------- History: Risk factors: Diabetes mellitus.  ------------------------------------------------------------------- Study Conclusions  - Left ventricle: The cavity size was normal. Systolic function was  vigorous. The estimated ejection fraction was in the range of 65%  to 70%. Wall motion was normal; there were no regional wall  motion abnormalities. Doppler parameters are consistent with  abnormal left ventricular relaxation (grade 1 diastolic  dysfunction). Doppler parameters are consistent with elevated  ventricular end-diastolic filling pressure. - Aortic valve: Trileaflet; normal thickness leaflets.  Transvalvular velocity was within the normal range. There was no  stenosis. There was no regurgitation. - Aortic root: The aortic root was normal in size. - Mitral valve: Severe mitral annular calcifications, predominantly  posterior. Calcified annulus. Mildly thickened leaflets . There  was no regurgitation. - Right ventricle: The cavity size was normal. Wall thickness was  normal. Systolic function was normal. - Tricuspid valve: There was no regurgitation. - Pulmonary arteries: Systolic pressure was within the normal  range.   ASSESSMENT:    1. Acute on chronic diastolic congestive heart failure ([place])   2. Essential hypertension   3. Hyperlipidemia   4. minimal CAD on cath 05/2013   5. OSA (obstructive sleep apnea)      PLAN:  In order of problems listed above:  1. Acute on chronic diastolic heart failure  - I think a large portion of her shortness and may be related to venous insufficiency, however with weight gain, I will attempt a trial of higher dose of Lasix with 80 mg in a.m., 40 mg in p.m. I will obtain a basic metabolic panel today and repeat in one in 1 week. I would not  obtain a BNP as her number may be falsely low due to morbid obesity  - Will see the patient back in 2-3 weeks. If renal function worsen, then her symptom is likely related to venous insufficiency   2. HTN: well controlled on coreg and telmisartan  3. HLD: continue Vitorin  4. Minimal CAD on cath  05/2013 in [place]: no angina    Medication Adjustments/Labs and Tests Ordered: Current medicines are reviewed at length with the patient today.  Concerns regarding medicines are outlined above.  Medication changes, Labs and Tests ordered today are listed in the Patient Instructions below. Patient Instructions  Medication Instructions:  Your physician has recommended you make the following change in your medication:  1.  INCREASE the Lasix 80 taking 1 tablet in the a.m. And 1/2 tablet in the p.m.   Labwork: TODAY:  BMET 10/31/15:  BMET  Testing/Procedures: None ordered  Follow-Up: Your physician recommends that you schedule a follow-up appointment in: 2-3 [place] PA-C   Any Other Special Instructions Will Be Listed Below (If Applicable).  CONTINUE TO WEIGH YOURSELF EVERY MORNING   If you need a refill on your cardiac medications before your next appointment, please call your pharmacy.       Hilbert Corrigan, [place]  10/24/2015 11:13 AM    [address], [place], [place]  [address] Phone: [phone]; Fax: [phone]

## 2015-10-24 NOTE — Patient Instructions (Addendum)
Medication Instructions:  Your physician has recommended you make the following change in your medication:  1.  INCREASE the Lasix 80 taking 1 tablet in the a.m. And 1/2 tablet in the p.m.   Labwork: TODAY:  BMET 10/31/15:  BMET  Testing/Procedures: None ordered  Follow-Up: Your physician recommends that you schedule a follow-up appointment in: 2-3 [place] PA-C   Any Other Special Instructions Will Be Listed Below (If Applicable).  CONTINUE TO WEIGH YOURSELF EVERY MORNING   If you need a refill on your cardiac medications before your next appointment, please call your pharmacy.

## 2015-10-30 ENCOUNTER — Telehealth: Payer: Self-pay | Admitting: *Deleted

## 2015-10-30 NOTE — Telephone Encounter (Signed)
-----   Message from [place], [place] sent at 10/26/2015 10:44 AM EDT ----- Kidney function worsened slightly, I am concerned she is being dehydrated after a week of metolazone daily. Change of plan, Hold lasix for 1 days, restart at 80mg  daily for 3 days, then go up to 80mg  in AM and 40mg  in PM, recheck BMET on 7/24

## 2015-10-30 NOTE — Telephone Encounter (Signed)
P;t aware of her lab results.  She will stop Lasix X's 1 day, then she will restart it at 80 mg X's 3 days, then she will increase it to 80 mg in a.m. And 40 mg p.m Pt agreeable with this plan and order has been placed in the computer

## 2015-10-31 ENCOUNTER — Other Ambulatory Visit: Payer: Medicare Other

## 2015-11-05 ENCOUNTER — Other Ambulatory Visit: Payer: Medicare Other | Admitting: *Deleted

## 2015-11-05 ENCOUNTER — Other Ambulatory Visit: Payer: Self-pay | Admitting: Physician Assistant

## 2015-11-05 DIAGNOSIS — I5033 Acute on chronic diastolic (congestive) heart failure: Secondary | ICD-10-CM | POA: Diagnosis not present

## 2015-11-05 DIAGNOSIS — I10 Essential (primary) hypertension: Secondary | ICD-10-CM

## 2015-11-05 LAB — BASIC METABOLIC PANEL
BUN: 36 mg/dL — AB (ref 7–25)
CHLORIDE: 103 mmol/L (ref 98–110)
CO2: 28 mmol/L (ref 20–31)
CREATININE: 1.39 mg/dL — AB (ref 0.60–0.88)
Calcium: 8.6 mg/dL (ref 8.6–10.4)
GLUCOSE: 133 mg/dL — AB (ref 65–99)
Potassium: 4.1 mmol/L (ref 3.5–5.3)
Sodium: 140 mmol/L (ref 135–146)

## 2015-11-06 ENCOUNTER — Telehealth: Payer: Self-pay | Admitting: Internal Medicine

## 2015-11-06 ENCOUNTER — Encounter: Payer: Self-pay | Admitting: Physician Assistant

## 2015-11-06 DIAGNOSIS — N183 Chronic kidney disease, stage 3 unspecified: Secondary | ICD-10-CM | POA: Insufficient documentation

## 2015-11-06 DIAGNOSIS — N184 Chronic kidney disease, stage 4 (severe): Secondary | ICD-10-CM | POA: Insufficient documentation

## 2015-11-06 DIAGNOSIS — I5032 Chronic diastolic (congestive) heart failure: Secondary | ICD-10-CM | POA: Insufficient documentation

## 2015-11-06 NOTE — Progress Notes (Addendum)
Cardiology Office Note    Date:  11/07/2015  ID:  Brandi Melton, DOB 07-26-1930, MRN [id number] PCP:  No PCP Per Patient  Cardiologist:  Harrington Challenger  Chief Complaint: f/u CHF  History of Present Illness:  Brandi Melton is a 82 y.o. female with history of chronic diastolic HF, HTN, HLD, OSA, CAD, RA, probable CKD stage III per labs, morbid obesity and h/o bell's palsy. She had a cardiac catheterization from [place] in 05/2013 that showed only minimal CAD with increased filling pressure. 2D echo 06/2015: EF 65-70%, no RWMA, grade 1 DD, elevated LVEDP, severe mitral annular calcifications. She has been followed by cardiology primarily for her CHF requiring episodic metolazone. She was seen in clinic by Almyra Deforest PA-C 10/24/15 for symptoms of SOB and weight gain, thus Lasix was increased to 80mg  QAM/40mg  QPM. Prior BNPs have been normal (felt falsely low due to morbid obesity). F/u BMET 11/05/15: Na 140, K 4.1, BUN/Cr 36/1.39 (improved from 1 week prior with Cr 1.64). Albumin earlier this year was 3.4.  She presents back to clinic and is continuing to complain of volume overload. She reports continued DOE, LEE, increased abdominal girth, and her pants fitting tighter. Pulse ox drops to 70% on ambulation (92% at rest). She has not noticed any improvement with the increase in oral Lasix. She has not had any chest pain or orthopnea.  Weight at DC in 06/2015 was 234lb. Recent office weights 248 -> 251 -> 255lb today. She lives at [employer] and expresses frustration at the lack of low sodium options available to her for meals. She drinks 3 unsweet teas daily but states she stays under the 2L fluid restriction. Last dose of metolazone 7/18.   Past Medical History:  Diagnosis Date  . Chronic diastolic CHF (congestive heart failure) (H[facility])    a. h/o cardiac catheterization from [place] in 05/2013 that showed only minimal CAD with increased filling pressure.  . CKD (chronic kidney disease), stage II   . Depression   .  Diabetes mellitus ([place])   . Hyperlipemia   . Hypertension   . Hypertensive heart disease   . Morbid obesity ([place])   . Palsy, Bell's   . RA (rheumatoid arthritis) ([place])   . Sleep apnea     Past Surgical History:  Procedure Laterality Date  . CARDIAC CATHETERIZATION  2013ish  . CARPAL TUNNEL RELEASE Right 08/08/2014   Procedure: OPEN CARPAL TUNNEL RELEASE;  Surgeon: Roseanne Kaufman, MD;  Location: [facility];  Service: Orthopedics;  Laterality: Right;  . CHOLECYSTECTOMY    . COLONOSCOPY    . JOINT REPLACEMENT Bilateral    Hips  . KNEE ARTHROPLASTY Left   . ORIF WRIST FRACTURE Right 08/08/2014   Procedure: Right OPEN REDUCTION INTERNAL FIXATION (ORIF) WRIST FRACTURE WITH REPAIR AND RECONST/ALLOGRAFT AND BONE GRAFT;  Surgeon: Roseanne Kaufman, MD;  Location: [facility];  Service: Orthopedics;  Laterality: Right;  . WRIST FRACTURE SURGERY Right 08/08/2014    Current Medications: Current Outpatient Prescriptions  Medication Sig Dispense Refill  . aspirin EC 81 MG tablet Take 81 mg by mouth daily.    . calcium carbonate (TUMS EX) 750 MG chewable tablet Chew 1 tablet by mouth daily.    . carvedilol (COREG CR) 80 MG 24 hr capsule Take 1 capsule (80 mg total) by mouth daily. 90 capsule 3  . chlorhexidine (PERIDEX) 0.12 % solution Use as directed 5 mLs in the mouth or throat 2 (two) times daily.     . cholecalciferol (VITAMIN  D) 1000 units tablet Take 3,000 Units by mouth daily.    Marland Kitchen ezetimibe-simvastatin (VYTORIN) 10-40 MG per tablet Take 1 tablet by mouth daily.     . folic acid (FOLVITE) A999333 MCG tablet Take 400 mcg by mouth daily.    . furosemide (LASIX) 80 MG tablet TAKE 1 TABLET BY MOUTH IN THE A.M. AND 1/2 TABLET BY MOUTH IN THE P.M. 60 tablet 6  . insulin aspart (NOVOLOG) 100 UNIT/ML injection Inject 30 Units into the skin 4 (four) times daily.    . Insulin Detemir (LEVEMIR) 100 UNIT/ML Pen Inject 80 Units into the skin 2 (two) times daily.     . magnesium oxide (MAG-OX) 400 MG tablet Take 400 mg  by mouth daily.    . potassium chloride SA (K-DUR,KLOR-CON) 20 MEQ tablet Take 20 mEq by mouth daily.    Marland Kitchen telmisartan (MICARDIS) 20 MG tablet Take 20 mg by mouth daily.    . Vitamin D, Ergocalciferol, (DRISDOL) 50000 UNITS CAPS capsule Take 50,000 Units by mouth every Wednesday.      No current facility-administered medications for this visit.      Allergies:   Review of patient's allergies indicates no known allergies.   Social History   Social History  . Marital status: Widowed    Spouse name: N/A  . Number of children: N/A  . Years of education: N/A   Social History Main Topics  . Smoking status: Never Smoker  . Smokeless tobacco: Never Used  . Alcohol use No  . Drug use: No  . Sexual activity: Not Asked   Other Topics Concern  . None   Social History Narrative  . None     Family History:  The patient's family history includes Stroke in her mother; Sudden death in her mother.   ROS:   Please see the history of present illness.  All other systems are reviewed and otherwise negative.    PHYSICAL EXAM:   VS:  BP 128/66   Pulse 72   Ht 5\' 3"  (1.6 m)   Wt 255 lb 6.4 oz (115.8 kg)   SpO2 92%   BMI 45.24 kg/m   BMI: Body mass index is 45.24 kg/m. GEN: Well nourished, well developed morbidly obese WF in no acute distress. HEENT: normocephalic, atraumatic Neck: no JVD, carotid bruits, or masses Cardiac: RRR; no murmurs, rubs, or gallops, 2+ BLE edema up to thighs Respiratory:  Diminished throughout particularly at bases, normal work of breathing GI: soft, nontender, nondistended, + BS MS: no atrophy. + kyphotic posture Skin: warm and dry, no rash Neuro:  Alert and Oriented x 3, Strength and sensation are intact, follows commands Psych: euthymic mood, full affect  Wt Readings from Last 3 Encounters:  11/07/15 255 lb 6.4 oz (115.8 kg)  10/24/15 251 lb (113.9 kg)  10/08/15 248 lb 12.8 oz (112.9 kg)      Studies/Labs Reviewed:   EKG:  EKG was ordered today  and personally reviewed by me and demonstrates NSR 72bpm, LAFB, LVH with QRS widening, nonspecific ST-T changes  Recent Labs: 07/02/2015: ALT 25; Hemoglobin 11.3; Platelets 196; TSH 2.212 10/01/2015: Brain Natriuretic Peptide 45.6 11/05/2015: BUN 36; Creat 1.39; Potassium 4.1; Sodium 140   Lipid Panel No results found for: CHOL, TRIG, HDL, CHOLHDL, VLDL, LDLCALC, LDLDIRECT  Additional studies/ records that were reviewed today include: Summarized above.    ASSESSMENT & PLAN:   The patient was seen by myself and Dr. Angelena Form.  1. Acute on chronic diastolic CHF/hypertensive heart  disease - weight continues to rise. She has failed outpatient management. Weight is now 21lbs above discharge weight in 06/2015. Not clear how much of this is fluid given her morbid obesity but she certainly appears volume overloaded. Given hypoxia, she requires hospital admission. Will admit and place on IV Lasix 80mg  BID. Give metolazone 2.5mg  once prior to first dose of Lasix. Follow strict I/O's. Nutrition eval to help her sort out if there are any low sodium options at [facility]. Follow labs. May need to consider RHC to clarify endpoint of diuresis. She initially declined admission today, citing she would go tomorrow, then agreed to go later this afternoon after her grandson comes into town. I discussed potential health risks of waiting - she verbalized understanding. 2. Acute hypoxic respiratory failure - suspect due to CHF. Will need to reassess at end of hospital stay to determine if she requires [facility] upon discharge. Obtain CXR. 3. CKD stage III - follow with diuresis. 4. Morbid obesity - may benefit from HHPT at discharge to improve mobility. 5. Essential HTN - monitor BP with diuresis.  Disposition: F/u TBD upon discharge.  Medication Adjustments/Labs and Tests Ordered: Current medicines are reviewed at length with the patient today.  Concerns regarding medicines are outlined above. Medication changes, Labs  and Tests ordered today are summarized above and listed in the Patient Instructions accessible in Encounters.   Raechel Ache PA-C  11/07/2015 9:46 AM    [facility] [place], [place], [place]  [address] Phone: [phone]; Fax: [phone]

## 2015-11-07 ENCOUNTER — Inpatient Hospital Stay (HOSPITAL_COMMUNITY)
Admission: AD | Admit: 2015-11-07 | Discharge: 2015-11-16 | DRG: 291 | Disposition: A | Discharge status: Home / Self Care | Payer: Medicare Other | Source: Ambulatory Visit | Attending: Cardiovascular Disease | Admitting: Cardiovascular Disease

## 2015-11-07 ENCOUNTER — Ambulatory Visit (INDEPENDENT_AMBULATORY_CARE_PROVIDER_SITE_OTHER): Payer: Medicare Other | Admitting: Physician Assistant

## 2015-11-07 ENCOUNTER — Encounter: Payer: Self-pay | Admitting: Physician Assistant

## 2015-11-07 VITALS — BP 128/66 | HR 72 | Ht 63.0 in | Wt 255.4 lb

## 2015-11-07 DIAGNOSIS — Z6841 Body Mass Index (BMI) 40.0 and over, adult: Secondary | ICD-10-CM

## 2015-11-07 DIAGNOSIS — I5043 Acute on chronic combined systolic (congestive) and diastolic (congestive) heart failure: Secondary | ICD-10-CM | POA: Diagnosis present

## 2015-11-07 DIAGNOSIS — Z794 Long term (current) use of insulin: Secondary | ICD-10-CM | POA: Diagnosis not present

## 2015-11-07 DIAGNOSIS — I2510 Atherosclerotic heart disease of native coronary artery without angina pectoris: Secondary | ICD-10-CM | POA: Diagnosis present

## 2015-11-07 DIAGNOSIS — Z7982 Long term (current) use of aspirin: Secondary | ICD-10-CM | POA: Diagnosis not present

## 2015-11-07 DIAGNOSIS — N183 Chronic kidney disease, stage 3 unspecified: Secondary | ICD-10-CM

## 2015-11-07 DIAGNOSIS — I5033 Acute on chronic diastolic (congestive) heart failure: Secondary | ICD-10-CM

## 2015-11-07 DIAGNOSIS — M109 Gout, unspecified: Secondary | ICD-10-CM | POA: Diagnosis present

## 2015-11-07 DIAGNOSIS — I130 Hypertensive heart and chronic kidney disease with heart failure and stage 1 through stage 4 chronic kidney disease, or unspecified chronic kidney disease: Secondary | ICD-10-CM | POA: Diagnosis present

## 2015-11-07 DIAGNOSIS — I10 Essential (primary) hypertension: Secondary | ICD-10-CM | POA: Diagnosis present

## 2015-11-07 DIAGNOSIS — I110 Hypertensive heart disease with heart failure: Secondary | ICD-10-CM

## 2015-11-07 DIAGNOSIS — Z96643 Presence of artificial hip joint, bilateral: Secondary | ICD-10-CM | POA: Diagnosis present

## 2015-11-07 DIAGNOSIS — E1122 Type 2 diabetes mellitus with diabetic chronic kidney disease: Secondary | ICD-10-CM | POA: Diagnosis present

## 2015-11-07 DIAGNOSIS — G4730 Sleep apnea, unspecified: Secondary | ICD-10-CM | POA: Diagnosis present

## 2015-11-07 DIAGNOSIS — J9601 Acute respiratory failure with hypoxia: Secondary | ICD-10-CM | POA: Diagnosis not present

## 2015-11-07 DIAGNOSIS — L0889 Other specified local infections of the skin and subcutaneous tissue: Secondary | ICD-10-CM | POA: Diagnosis present

## 2015-11-07 DIAGNOSIS — E662 Morbid (severe) obesity with alveolar hypoventilation: Secondary | ICD-10-CM | POA: Diagnosis present

## 2015-11-07 DIAGNOSIS — E11649 Type 2 diabetes mellitus with hypoglycemia without coma: Secondary | ICD-10-CM | POA: Diagnosis present

## 2015-11-07 DIAGNOSIS — E785 Hyperlipidemia, unspecified: Secondary | ICD-10-CM | POA: Diagnosis present

## 2015-11-07 DIAGNOSIS — R0902 Hypoxemia: Secondary | ICD-10-CM

## 2015-11-07 DIAGNOSIS — E119 Type 2 diabetes mellitus without complications: Secondary | ICD-10-CM

## 2015-11-07 DIAGNOSIS — M069 Rheumatoid arthritis, unspecified: Secondary | ICD-10-CM | POA: Diagnosis present

## 2015-11-07 DIAGNOSIS — N184 Chronic kidney disease, stage 4 (severe): Secondary | ICD-10-CM | POA: Diagnosis present

## 2015-11-07 DIAGNOSIS — R0602 Shortness of breath: Secondary | ICD-10-CM | POA: Diagnosis not present

## 2015-11-07 DIAGNOSIS — I509 Heart failure, unspecified: Secondary | ICD-10-CM | POA: Diagnosis not present

## 2015-11-07 DIAGNOSIS — R05 Cough: Secondary | ICD-10-CM | POA: Diagnosis not present

## 2015-11-07 HISTORY — DX: Other specified postprocedural states: Z98.890

## 2015-11-07 HISTORY — DX: Chronic diastolic (congestive) heart failure: I50.32

## 2015-11-07 HISTORY — DX: Unspecified osteoarthritis, unspecified site: M19.90

## 2015-11-07 HISTORY — DX: Type 2 diabetes mellitus without complications: E11.9

## 2015-11-07 LAB — COMPREHENSIVE METABOLIC PANEL
ALK PHOS: 70 U/L (ref 38–126)
ALT: 24 U/L (ref 14–54)
AST: 25 U/L (ref 15–41)
Albumin: 3.3 g/dL — ABNORMAL LOW (ref 3.5–5.0)
Anion gap: 7 (ref 5–15)
BILIRUBIN TOTAL: 0.6 mg/dL (ref 0.3–1.2)
BUN: 27 mg/dL — AB (ref 6–20)
CALCIUM: 8.8 mg/dL — AB (ref 8.9–10.3)
CO2: 29 mmol/L (ref 22–32)
CREATININE: 1.53 mg/dL — AB (ref 0.44–1.00)
Chloride: 106 mmol/L (ref 101–111)
GFR calc Af Amer: 35 mL/min — ABNORMAL LOW (ref >60)
GFR, EST NON AFRICAN AMERICAN: 31 mL/min — AB (ref >60)
Glucose, Bld: 111 mg/dL — ABNORMAL HIGH (ref 65–99)
Potassium: 4.2 mmol/L (ref 3.5–5.1)
Sodium: 142 mmol/L (ref 135–145)
TOTAL PROTEIN: 6.6 g/dL (ref 6.5–8.1)

## 2015-11-07 LAB — CBC WITH DIFFERENTIAL/PLATELET
BASOS ABS: 0 10*3/uL (ref 0.0–0.1)
Basophils Relative: 0 %
EOS ABS: 0.4 10*3/uL (ref 0.0–0.7)
EOS PCT: 5 %
HCT: 37.2 % (ref 36.0–46.0)
Hemoglobin: 11.2 g/dL — ABNORMAL LOW (ref 12.0–15.0)
Lymphocytes Relative: 28 %
Lymphs Abs: 2.3 10*3/uL (ref 0.7–4.0)
MCH: 30 pg (ref 26.0–34.0)
MCHC: 30.1 g/dL (ref 30.0–36.0)
MCV: 99.7 fL (ref 78.0–100.0)
MONO ABS: 0.9 10*3/uL (ref 0.1–1.0)
Monocytes Relative: 11 %
Neutro Abs: 4.7 10*3/uL (ref 1.7–7.7)
Neutrophils Relative %: 56 %
PLATELETS: 208 10*3/uL (ref 150–400)
RBC: 3.73 MIL/uL — AB (ref 3.87–5.11)
RDW: 15.3 % (ref 11.5–15.5)
WBC: 8.3 10*3/uL (ref 4.0–10.5)

## 2015-11-07 LAB — GLUCOSE, CAPILLARY
GLUCOSE-CAPILLARY: 115 mg/dL — AB (ref 65–99)
Glucose-Capillary: 169 mg/dL — ABNORMAL HIGH (ref 65–99)

## 2015-11-07 LAB — MAGNESIUM: MAGNESIUM: 2.3 mg/dL (ref 1.7–2.4)

## 2015-11-07 LAB — TSH: TSH: 1.824 u[IU]/mL (ref 0.350–4.500)

## 2015-11-07 LAB — BRAIN NATRIURETIC PEPTIDE: B NATRIURETIC PEPTIDE 5: 89.1 pg/mL (ref 0.0–100.0)

## 2015-11-07 MED ORDER — VITAMIN D (ERGOCALCIFEROL) 1.25 MG (50000 UNIT) PO CAPS
50000.0000 [IU] | ORAL_CAPSULE | ORAL | Status: DC
  Administered 2015-11-14: 50000 [IU] via ORAL
  Filled 2015-11-07: qty 1

## 2015-11-07 MED ORDER — VITAMIN D 1000 UNITS PO TABS
3000.0000 [IU] | ORAL_TABLET | Freq: Every day | ORAL | Status: DC
  Administered 2015-11-08 – 2015-11-16 (×8): 3000 [IU] via ORAL
  Filled 2015-11-07 (×10): qty 3

## 2015-11-07 MED ORDER — MAGNESIUM OXIDE 400 (241.3 MG) MG PO TABS
400.0000 mg | ORAL_TABLET | Freq: Every day | ORAL | Status: DC
  Administered 2015-11-08 – 2015-11-16 (×9): 400 mg via ORAL
  Filled 2015-11-07 (×9): qty 1

## 2015-11-07 MED ORDER — HEPARIN SODIUM (PORCINE) 5000 UNIT/ML IJ SOLN
5000.0000 [IU] | Freq: Three times a day (TID) | INTRAMUSCULAR | Status: DC
  Administered 2015-11-07 – 2015-11-16 (×26): 5000 [IU] via SUBCUTANEOUS
  Filled 2015-11-07 (×26): qty 1

## 2015-11-07 MED ORDER — SODIUM CHLORIDE 0.9% FLUSH
3.0000 mL | Freq: Two times a day (BID) | INTRAVENOUS | Status: DC
  Administered 2015-11-07 – 2015-11-16 (×17): 3 mL via INTRAVENOUS

## 2015-11-07 MED ORDER — ASPIRIN EC 81 MG PO TBEC
81.0000 mg | DELAYED_RELEASE_TABLET | Freq: Every day | ORAL | Status: DC
  Administered 2015-11-08 – 2015-11-16 (×9): 81 mg via ORAL
  Filled 2015-11-07 (×10): qty 1

## 2015-11-07 MED ORDER — METOLAZONE 2.5 MG PO TABS
2.5000 mg | ORAL_TABLET | Freq: Once | ORAL | Status: AC
  Administered 2015-11-07: 2.5 mg via ORAL
  Filled 2015-11-07: qty 1

## 2015-11-07 MED ORDER — FOLIC ACID 0.5 MG HALF TAB
500.0000 ug | ORAL_TABLET | Freq: Every day | ORAL | Status: DC
  Administered 2015-11-08 – 2015-11-15 (×8): 0.5 mg via ORAL
  Filled 2015-11-07 (×9): qty 1

## 2015-11-07 MED ORDER — POTASSIUM CHLORIDE CRYS ER 20 MEQ PO TBCR
20.0000 meq | EXTENDED_RELEASE_TABLET | Freq: Every day | ORAL | Status: DC
  Administered 2015-11-08 – 2015-11-16 (×9): 20 meq via ORAL
  Filled 2015-11-07 (×10): qty 1

## 2015-11-07 MED ORDER — INSULIN ASPART 100 UNIT/ML ~~LOC~~ SOLN: 30.0000 [IU] | Freq: Four times a day (QID) | SUBCUTANEOUS | Status: DC

## 2015-11-07 MED ORDER — ACETAMINOPHEN 325 MG PO TABS
650.0000 mg | ORAL_TABLET | ORAL | Status: DC | PRN
  Administered 2015-11-15: 650 mg via ORAL
  Filled 2015-11-07 (×2): qty 2

## 2015-11-07 MED ORDER — CARVEDILOL PHOSPHATE ER 80 MG PO CP24
80.0000 mg | ORAL_CAPSULE | Freq: Every day | ORAL | Status: DC
  Administered 2015-11-08 – 2015-11-16 (×9): 80 mg via ORAL
  Filled 2015-11-07 (×9): qty 1

## 2015-11-07 MED ORDER — ONDANSETRON HCL 4 MG/2ML IJ SOLN: 4.0000 mg | Freq: Four times a day (QID) | INTRAMUSCULAR | Status: DC | PRN

## 2015-11-07 MED ORDER — SODIUM CHLORIDE 0.9% FLUSH: 3.0000 mL | INTRAVENOUS | Status: DC | PRN

## 2015-11-07 MED ORDER — IRBESARTAN 75 MG PO TABS
75.0000 mg | ORAL_TABLET | Freq: Every day | ORAL | Status: DC
  Administered 2015-11-08 – 2015-11-16 (×9): 75 mg via ORAL
  Filled 2015-11-07 (×9): qty 1

## 2015-11-07 MED ORDER — CALCIUM CARBONATE ANTACID 500 MG PO CHEW
750.0000 mg | CHEWABLE_TABLET | Freq: Every day | ORAL | Status: DC
  Administered 2015-11-08: 750 mg via ORAL
  Filled 2015-11-07: qty 4

## 2015-11-07 MED ORDER — INSULIN DETEMIR 100 UNIT/ML ~~LOC~~ SOLN
80.0000 [IU] | Freq: Two times a day (BID) | SUBCUTANEOUS | Status: DC
  Administered 2015-11-07: 80 [IU] via SUBCUTANEOUS
  Filled 2015-11-07 (×3): qty 0.8

## 2015-11-07 MED ORDER — EZETIMIBE-SIMVASTATIN 10-40 MG PO TABS
1.0000 | ORAL_TABLET | Freq: Every day | ORAL | Status: DC
  Administered 2015-11-08 – 2015-11-16 (×9): 1 via ORAL
  Filled 2015-11-07 (×9): qty 1

## 2015-11-07 MED ORDER — INSULIN ASPART 100 UNIT/ML ~~LOC~~ SOLN
0.0000 [IU] | Freq: Three times a day (TID) | SUBCUTANEOUS | Status: DC
  Administered 2015-11-08: 2 [IU] via SUBCUTANEOUS
  Administered 2015-11-08 – 2015-11-09 (×2): 5 [IU] via SUBCUTANEOUS
  Administered 2015-11-09: 2 [IU] via SUBCUTANEOUS
  Administered 2015-11-09: 3 [IU] via SUBCUTANEOUS
  Administered 2015-11-10: 7 [IU] via SUBCUTANEOUS
  Administered 2015-11-10: 5 [IU] via SUBCUTANEOUS
  Administered 2015-11-10 – 2015-11-11 (×2): 3 [IU] via SUBCUTANEOUS
  Administered 2015-11-11 – 2015-11-12 (×4): 5 [IU] via SUBCUTANEOUS
  Administered 2015-11-12 – 2015-11-13 (×2): 3 [IU] via SUBCUTANEOUS
  Administered 2015-11-13: 9 [IU] via SUBCUTANEOUS

## 2015-11-07 MED ORDER — SODIUM CHLORIDE 0.9 % IV SOLN: 250.0000 mL | INTRAVENOUS | Status: DC | PRN

## 2015-11-07 MED ORDER — FUROSEMIDE 10 MG/ML IJ SOLN
80.0000 mg | Freq: Two times a day (BID) | INTRAMUSCULAR | Status: DC
  Administered 2015-11-08 – 2015-11-12 (×9): 80 mg via INTRAVENOUS
  Filled 2015-11-07 (×9): qty 8

## 2015-11-07 MED ORDER — CHLORHEXIDINE GLUCONATE 0.12 % MT SOLN
5.0000 mL | Freq: Two times a day (BID) | OROMUCOSAL | Status: DC
  Administered 2015-11-07 – 2015-11-16 (×16): 5 mL via OROMUCOSAL
  Filled 2015-11-07 (×17): qty 15

## 2015-11-07 NOTE — Telephone Encounter (Signed)
I did not call the patient. She was seen today in clinic with plan to be admitted to [facility] later today.

## 2015-11-07 NOTE — Patient Instructions (Addendum)
Medication Instructions:   YOU HAVE BEEN RECOMMENDED BY YOUR PROVIDER TODAY TO BE ADMITTED IN   [facility] FOR YOUR ACUTE ON CHRONIC DIASTOLIC HEART FAILURE   YOUR ROOM HAS BEEN REQUESTED FOR YOU AT [facility] AND YOU   WILL BE CONTACTED BY PATIENT INTAKE CLERK WHEN YOUR ROOM IS READY

## 2015-11-07 NOTE — Telephone Encounter (Signed)
New Call    Returning A Call

## 2015-11-07 NOTE — H&P (Addendum)
Cardiology H&P    Date: 11/07/2015 ID: Brandi, Melton April 06, 1931, MRN [id number] PCP:No PCP Per Patient Cardiologist:Ross  Chief Complaint:f/u CHF  History of Present Illness:  Brandi Melton a 82 y.o.femalewith history of chronic diastolic HF, HTN, HLD, OSA, CAD, RA, probable CKD stage III per labs, morbid obesity and h/o bell's palsy. She had a cardiac catheterization from [place] in 05/2013 that showed only minimal CAD with increased filling pressure. 2D echo 06/2015: EF 65-70%, no RWMA, grade 1 DD, elevated LVEDP, severe mitral annular calcifications. She has been followed by cardiology primarily for her CHF requiring episodic metolazone. She was seen in clinic by Almyra Deforest PA-C 10/24/15 for symptoms of SOB and weight gain, thus Lasix was increased to 80mg  QAM/40mg  QPM. Prior BNPs have been normal (felt falsely low due to morbid obesity). F/u BMET 11/05/15: Na 140, K 4.1, BUN/Cr 36/1.39 (improved from 1 week prior with Cr 1.64). Albumin earlier this year was 3.4.  She presents back to clinic and is continuing to complain of volume overload. She reports continued DOE, LEE, increased abdominal girth, and her pants fitting tighter. Pulse ox drops to 70% on ambulation (92% at rest). She has not noticed any improvement with the increase in oral Lasix. She has not had any chest pain or orthopnea.  Weight at DC in 06/2015 was 234lb. Recent office weights 248 -> 251 -> 255lb today. She lives at [facility] and expresses frustration at the lack of low sodium options available to her for meals. She drinks 3 unsweet teas daily but states she stays under the 2L fluid restriction. Last dose of metolazone 7/18.       Past Medical History:  Diagnosis Date  . Chronic diastolic CHF (congestive heart failure) (HCC)    a. h/o cardiac catheterization from [place] in 05/2013 that showed only minimal CAD with increased filling pressure.  . CKD (chronic kidney disease),  stage II   . Depression   . Diabetes mellitus ([place])   . Hyperlipemia   . Hypertension   . Hypertensive heart disease   . Morbid obesity ([place])   . Palsy, Bell's   . RA (rheumatoid arthritis) ([place])   . Sleep apnea          Past Surgical History:  Procedure Laterality Date  . CARDIAC CATHETERIZATION  2013ish  . CARPAL TUNNEL RELEASE Right 08/08/2014   Procedure: OPEN CARPAL TUNNEL RELEASE; Surgeon: Roseanne Kaufman, MD; Location: [facility]; Service: Orthopedics; Laterality: Right;  . CHOLECYSTECTOMY    . COLONOSCOPY    . JOINT REPLACEMENT Bilateral    Hips  . KNEE ARTHROPLASTY Left   . ORIF WRIST FRACTURE Right 08/08/2014   Procedure: Right OPEN REDUCTION INTERNAL FIXATION (ORIF) WRIST FRACTURE WITH REPAIR AND RECONST/ALLOGRAFT AND BONE GRAFT; Surgeon: Roseanne Kaufman, MD; Location: [facility]; Service: Orthopedics; Laterality: Right;  . WRIST FRACTURE SURGERY Right 08/08/2014    Current Medications:       Current Outpatient Prescriptions  Medication Sig Dispense Refill  . aspirin EC 81 MG tablet Take 81 mg by mouth daily.    . calcium carbonate (TUMS EX) 750 MG chewable tablet Chew 1 tablet by mouth daily.    . carvedilol (COREG CR) 80 MG 24 hr capsule Take 1 capsule (80 mg total) by mouth daily. 90 capsule 3  . chlorhexidine (PERIDEX) 0.12 % solution Use as directed 5 mLs in the mouth or throat 2 (two) times daily.     . cholecalciferol (VITAMIN D) 1000 units tablet Take  3,000 Units by mouth daily.    Marland Kitchen ezetimibe-simvastatin (VYTORIN) 10-40 MG per tablet Take 1 tablet by mouth daily.     . folic acid (FOLVITE) A999333 MCG tablet Take 400 mcg by mouth daily.    . furosemide (LASIX) 80 MG tablet TAKE 1 TABLET BY MOUTH IN THE A.M. AND 1/2 TABLET BY MOUTH IN THE P.M. 60 tablet 6  . insulin aspart (NOVOLOG) 100 UNIT/ML injection Inject 30 Units into the skin 4 (four) times daily.    . Insulin Detemir (LEVEMIR) 100 UNIT/ML Pen Inject 80 Units into  the skin 2 (two) times daily.     . magnesium oxide (MAG-OX) 400 MG tablet Take 400 mg by mouth daily.    . potassium chloride SA (K-DUR,KLOR-CON) 20 MEQ tablet Take 20 mEq by mouth daily.    Marland Kitchen telmisartan (MICARDIS) 20 MG tablet Take 20 mg by mouth daily.    . Vitamin D, Ergocalciferol, (DRISDOL) 50000 UNITS CAPS capsule Take 50,000 Units by mouth every Wednesday.      No current facility-administered medications for this visit.     Allergies: Review of patient's allergies indicates no known allergies.  Social History        Social History  . Marital status: Widowed    Spouse name: N/A  . Number of children: N/A  . Years of education: N/A       Social History Main Topics  . Smoking status: Never Smoker  . Smokeless tobacco: Never Used  . Alcohol use No  . Drug use: No  . Sexual activity: Not Asked       Other Topics Concern  . None      Social History Narrative  . None    Family History:The patient's family history includes Stroke in her mother; Sudden death in her mother.  ROS: Please see the history of present illness.  All other systems are reviewed and otherwise negative.    PHYSICAL EXAM:   VS:BP 128/66  Pulse 72  Ht 5\' 3"  (1.6 m)  Wt 255 lb 6.4 oz (115.8 kg)  SpO2 92%  BMI 45.24 kg/m BMI: Body mass index is 45.24 kg/m. GEN: Well nourished, well developed morbidly obese WFin no acute distress. HEENT: normocephalic, atraumatic Neck: no JVD, carotid bruits, or masses Cardiac: RRR; no murmurs, rubs, or gallops, 2+ BLE edema up to thighs Respiratory: Diminished throughout particularly at bases, normal work of breathing GI: soft, nontender, nondistended, + BS MS: no atrophy. + kyphotic posture Skin: warm and dry, no rash Neuro: Alert and Oriented x 3, Strength and sensation are intact, follows commands Psych: euthymic mood, full affect     Wt Readings from Last 3 Encounters:  11/07/15 255 lb  6.4 oz (115.8 kg)  10/24/15 251 lb (113.9 kg)  10/08/15 248 lb 12.8 oz (112.9 kg)     Studies/Labs Reviewed:   EKG:EKG was ordered today and personally reviewed by me and demonstrates NSR 72bpm, LAFB, LVH with QRS widening, nonspecific ST-T changes  Recent Labs: 07/02/2015: ALT 25; Hemoglobin 11.3; Platelets 196; TSH 2.212 10/01/2015: Brain Natriuretic Peptide 45.6 11/05/2015: BUN 36; Creat 1.39; Potassium 4.1; Sodium 140  Lipid Panel Labs(Brief)  No results found for: CHOL, TRIG, HDL, CHOLHDL, VLDL, LDLCALC, LDLDIRECT    Additional studies/ records that were reviewed today include: Summarized above.    ASSESSMENT & PLAN:   The patient was seen by myself and Dr. Angelena Form.  1. Acute on chronic diastolic CHF/hypertensive heart disease - weight continues to rise. She has  failed outpatient management. Weight is now 21lbs above discharge weight in 06/2015. Not clear how much of this is fluid given her morbid obesity but she certainly appears volume overloaded. Given hypoxia, she requires hospital admission. Will admit and place on IV Lasix 80mg  BID. Give metolazone 2.5mg  once prior to first dose of Lasix. Follow strict I/O's. Nutrition eval to help her sort out if there are any low sodium options at [facility]. Follow labs. May need to consider RHC to clarify endpoint of diuresis. She initially declined admission today, citing she would go tomorrow, then agreed to go later this afternoon after her grandson comes into town. I discussed potential health risks of waiting - she verbalized understanding. 2. Acute hypoxic respiratory failure - suspect due to CHF. Will need to reassess at end of hospital stay to determine if she requires [facility] upon discharge. Obtain CXR. 3. CKD stage III - follow with diuresis. 4. Morbid obesity - may benefit from HHPT at discharge to improve mobility. 5. Essential HTN - monitor BP with diuresis.  Disposition: F/u TBD upon  discharge.  Medication Adjustments/Labs and Tests Ordered: Current medicines are reviewed at length with the patient today. Concerns regarding medicines are outlined above. Medication changes, Labs and Tests ordered today are summarized above and listed in the Patient Instructions accessible in Encounters.   Signed, Burna Mortimer  7/26/20179:46 AM [facility] [place], [place], [place] [address] Phone: [phone]; Fax: [phone]   I have personally seen and examined this patient with Melina Copa, PA-C. I agree with the assessment and plan as outlined above. She is volume overloaded. My exam shows an obese WF with decreased bibasilar breath sounds, LE edema bilaterally. Vitals reviewed. Will admit to [facility] for Diuresis with acute on chronic diastolic CHF. Follow renal function closely. Will start IV Lasix and single dose metolazone.   Lauree Chandler 11/07/2015 11:19 AM

## 2015-11-08 ENCOUNTER — Inpatient Hospital Stay (HOSPITAL_COMMUNITY): Payer: Medicare Other

## 2015-11-08 LAB — BASIC METABOLIC PANEL
ANION GAP: 8 (ref 5–15)
BUN: 25 mg/dL — ABNORMAL HIGH (ref 6–20)
CALCIUM: 8.6 mg/dL — AB (ref 8.9–10.3)
CO2: 26 mmol/L (ref 22–32)
CREATININE: 1.21 mg/dL — AB (ref 0.44–1.00)
Chloride: 106 mmol/L (ref 101–111)
GFR calc Af Amer: 47 mL/min — ABNORMAL LOW (ref >60)
GFR, EST NON AFRICAN AMERICAN: 41 mL/min — AB (ref >60)
GLUCOSE: 91 mg/dL (ref 65–99)
Potassium: 4.4 mmol/L (ref 3.5–5.1)
Sodium: 140 mmol/L (ref 135–145)

## 2015-11-08 LAB — GLUCOSE, CAPILLARY
GLUCOSE-CAPILLARY: 198 mg/dL — AB (ref 65–99)
GLUCOSE-CAPILLARY: 204 mg/dL — AB (ref 65–99)
Glucose-Capillary: 73 mg/dL (ref 65–99)
Glucose-Capillary: 251 mg/dL — ABNORMAL HIGH (ref 65–99)

## 2015-11-08 LAB — MAGNESIUM: Magnesium: 2.5 mg/dL — ABNORMAL HIGH (ref 1.7–2.4)

## 2015-11-08 MED ORDER — DOXYCYCLINE HYCLATE 100 MG PO TABS
100.0000 mg | ORAL_TABLET | Freq: Two times a day (BID) | ORAL | Status: AC
  Administered 2015-11-08 – 2015-11-15 (×16): 100 mg via ORAL
  Filled 2015-11-08 (×16): qty 1

## 2015-11-08 MED ORDER — CALCIUM CARBONATE ANTACID 500 MG PO CHEW
750.0000 mg | CHEWABLE_TABLET | ORAL | Status: DC
  Administered 2015-11-09 – 2015-11-15 (×7): 750 mg via ORAL
  Filled 2015-11-08 (×7): qty 4

## 2015-11-08 NOTE — Progress Notes (Signed)
Patient Name: Brandi Melton Date of Encounter: 11/08/2015  Principal Problem:   Acute on chronic diastolic (congestive) heart failure ([place]) Active Problems:   Diabetes ([place])   Hypertension   Sleep apnea   CKD (chronic kidney disease), stage III   Morbid obesity ([place])   Acute respiratory failure with hypoxia [facility])   Primary Cardiologist: Dr. Harrington Challenger  Patient Profile: Brandi Melton is a 82 year old female with a past medical history of chronic diastolic CHF, HTN, HLD, OSA, CAD, RA and CKD stage III. She presented to the office on 11/07/15 with DOE and bilateral lower extremity edema. Her o2 saturations were in the 70's with ambulation, weight was up to 255lbs when previous "dry" weight was 234lbs. She was admitted for IV diuresis.   SUBJECTIVE: Still feels SOB, endorses orthopnea. Denies chest pain and palpitations.   OBJECTIVE Vitals:   11/07/15 1631 11/07/15 2100 11/08/15 0010 11/08/15 0439  BP: (!) 116/39 (!) 117/57 (!) 109/41 (!) 132/54  Pulse: 75 76 70 74  Resp:  18 20 18   Temp: 98.3 F (36.8 C) 97.4 F (36.3 C) 97.9 F (36.6 C) 98 F (36.7 C)  TempSrc: Oral Oral Oral Oral  SpO2: 94% 91% 95% 92%  Weight:    252 lb 3.2 oz (114.4 kg)    Intake/Output Summary (Last 24 hours) at 11/08/15 0839 Last data filed at 11/08/15 0600  Gross per 24 hour  Intake              120 ml  Output              600 ml  Net             -480 ml   Filed Weights   11/07/15 1547 11/08/15 0439  Weight: 253 lb 12.8 oz (115.1 kg) 252 lb 3.2 oz (114.4 kg)    PHYSICAL EXAM General: Well developed, well nourished, female in no acute distress. Head: Normocephalic, atraumatic.  Neck: Supple without bruits, hard to assess JVD. Lungs:  Resp regular and labored. Diminished throughout, crackles in bases.  Heart: RRR, S1, S2, no S3, S4, or murmur; no rub. Abdomen: Soft, non-tender, non-distended, BS + x 4.  Extremities: No clubbing, cyanosis, generalized edema, +1 pretibial edema.  Neuro: Alert and  oriented X 3. Moves all extremities spontaneously. Psych: Normal affect.  LABS: CBC: Recent Labs  11/07/15 1551  WBC 8.3  NEUTROABS 4.7  HGB 11.2*  HCT 37.2  MCV 99.7  PLT 123XX123   Basic Metabolic Panel: Recent Labs  11/07/15 1551 11/08/15 0327  NA 142 140  K 4.2 4.4  CL 106 106  CO2 29 26  GLUCOSE 111* 91  BUN 27* 25*  CREATININE 1.53* 1.21*  CALCIUM 8.8* 8.6*  MG 2.3 2.5*   Liver Function Tests: Recent Labs  11/07/15 1551  AST 25  ALT 24  ALKPHOS 70  BILITOT 0.6  PROT 6.6  ALBUMIN 3.3*   : Recent Labs  11/07/15 1551  TSH 1.824     Current Facility-Administered Medications:  .  0.9 %  sodium chloride infusion, 250 mL, Intravenous, PRN, Dayna N Dunn, PA-C .  acetaminophen (TYLENOL) tablet 650 mg, 650 mg, Oral, Q4H PRN, Dayna N Dunn, PA-C .  aspirin EC tablet 81 mg, 81 mg, Oral, Daily, Dayna N Dunn, PA-C .  calcium carbonate (TUMS - dosed in mg elemental calcium) chewable tablet 750 mg, 750 mg, Oral, Daily, Dayna N Dunn, PA-C .  carvedilol (COREG CR) 24  hr capsule 80 mg, 80 mg, Oral, Daily, Dayna N Dunn, PA-C .  chlorhexidine (PERIDEX) 0.12 % solution 5 mL, 5 mL, Mouth/Throat, BID, Dayna N Dunn, PA-C, 5 mL at 11/07/15 2337 .  cholecalciferol (VITAMIN D) tablet 3,000 Units, 3,000 Units, Oral, Daily, Dayna N Dunn, PA-C .  ezetimibe-simvastatin (VYTORIN) 10-40 MG per tablet 1 tablet, 1 tablet, Oral, Daily, Dayna N Dunn, PA-C .  folic acid (FOLVITE) tablet 0.5 mg, 500 mcg, Oral, Daily, Dayna N Dunn, PA-C .  furosemide (LASIX) injection 80 mg, 80 mg, Intravenous, BID, Dayna N Dunn, PA-C, 80 mg at 11/08/15 0818 .  heparin injection 5,000 Units, 5,000 Units, Subcutaneous, Q8H, Dayna N Dunn, PA-C, 5,000 Units at 11/07/15 2123 .  insulin aspart (novoLOG) injection 0-9 Units, 0-9 Units, Subcutaneous, TID WC, Dayna N Dunn, PA-C .  insulin aspart (novoLOG) injection 30 Units, 30 Units, Subcutaneous, QID, Dayna N Dunn, PA-C .  insulin detemir (LEVEMIR) injection 80  Units, 80 Units, Subcutaneous, BID, Dayna N Dunn, PA-C, 80 Units at 11/07/15 2336 .  irbesartan (AVAPRO) tablet 75 mg, 75 mg, Oral, Daily, Dayna N Dunn, PA-C .  magnesium oxide (MAG-OX) tablet 400 mg, 400 mg, Oral, Daily, Dayna N Dunn, PA-C .  ondansetron (ZOFRAN) injection 4 mg, 4 mg, Intravenous, Q6H PRN, Dayna N Dunn, PA-C .  potassium chloride SA (K-DUR,KLOR-CON) CR tablet 20 mEq, 20 mEq, Oral, Daily, Dayna N Dunn, PA-C .  sodium chloride flush (NS) 0.9 % injection 3 mL, 3 mL, Intravenous, Q12H, Dayna N Dunn, PA-C, 3 mL at 11/07/15 2123 .  sodium chloride flush (NS) 0.9 % injection 3 mL, 3 mL, Intravenous, PRN, Dayna N Dunn, PA-C .  [START ON 11/14/2015] Vitamin D (Ergocalciferol) (DRISDOL) capsule 50,000 Units, 50,000 Units, Oral, Q Wed, Dayna N Dunn, PA-C   TELE:  NSR  ECG: NSR, LAFB, LVH  Radiology/Studies: Dg Chest 2 View  Result Date: 11/08/2015 CLINICAL DATA:  Shortness of breath, cough, hypoxia, acute on chronic CHF, morbid obesity EXAM: CHEST  2 VIEW COMPARISON:  None in PACs FINDINGS: The lungs are borderline hypoinflated. There is lingular atelectasis or infiltrate. There is no pleural effusion. The heart is top-normal in size. The pulmonary vascularity is not engorged. There is calcification in the wall of the aortic arch. There is multilevel degenerative disc disease of the thoracic spine. IMPRESSION: Lingular atelectasis or interstitial pneumonia. Cardiomegaly without pulmonary edema or pleural effusion. Aortic atherosclerosis. Electronically Signed   By: David  Martinique M.D.   On: 11/08/2015 07:42    Current Medications:  . aspirin EC  81 mg Oral Daily  . calcium carbonate  750 mg Oral Daily  . carvedilol  80 mg Oral Daily  . chlorhexidine  5 mL Mouth/Throat BID  . cholecalciferol  3,000 Units Oral Daily  . ezetimibe-simvastatin  1 tablet Oral Daily  . folic acid  [id number] mcg Oral Daily  . furosemide  80 mg Intravenous BID  . heparin  5,000 Units Subcutaneous Q8H  . insulin  aspart  0-9 Units Subcutaneous TID WC  . insulin aspart  30 Units Subcutaneous QID  . insulin detemir  80 Units Subcutaneous BID  . irbesartan  75 mg Oral Daily  . magnesium oxide  400 mg Oral Daily  . potassium chloride SA  20 mEq Oral Daily  . sodium chloride flush  3 mL Intravenous Q12H  . [START ON 11/14/2015] Vitamin D (Ergocalciferol)  50,000 Units Oral Q Wed      ASSESSMENT AND PLAN: Principal Problem:  Acute on chronic diastolic (congestive) heart failure (HCC) Active Problems:   Diabetes ([place])   Hypertension   Sleep apnea   CKD (chronic kidney disease), stage III   Morbid obesity ([place])   Acute respiratory failure with hypoxia ([place])  1. Acute on chronic diastolic CHF: Did not get any IV Lasix last night, just got first dose this am. Still appears volume overloaded on exam. Will continue IV diuresis for today. Continue fluid restriction and daily weights.  Creatinine is 1.21, will follow. Continue ARB and Coreg.   2. HTN: Normotensive, continue ARB.   3. DM: On sliding scale insulin. Continue same.   4. CKD stage III: Appears baseline creatinine is 1.3-1.5. Stable.     Signed, Arbutus Leas , NP 8:39 AM 11/08/2015 Pager [phone] Patient seen and examined. I agree with the assessment and plan as detailed above. See also my additional thoughts below.   The plan will be to proceed with diuresis.  Dola Argyle, MD, [facility] 11/08/2015 9:10 AM

## 2015-11-08 NOTE — Care Management Important Message (Signed)
Important Message  Patient Details  Name: Brandi Melton MRN: [id number] Date of Birth: March 30, 1931   Medicare Important Message Given:  Yes    Loann Quill 11/08/2015, 11:46 AM

## 2015-11-08 NOTE — Clinical Social Work Note (Signed)
Clinical Social Work Assessment  Patient Details  Name: Brandi Melton MRN: [id number] Date of Birth: [birth year]  Date of referral:  11/08/15               Reason for consult:  Discharge Planning                Permission sought to share information with:  Facility Sport and exercise psychologist, Family Supports Permission granted to share information::  Yes, Verbal Permission Granted  Name::     Brandi Melton  Agency::  [facility]  Relationship::  Daughter  Contact Information:  [phone]  Housing/Transportation Living arrangements for the past 2 months:  [place] of Information:  Patient, Medical Team Patient Interpreter Needed:  None Criminal Activity/Legal Involvement Pertinent to Current Situation/Hospitalization:  No - Comment as needed Significant Relationships:  Adult Children, Other Family Members Lives with:  Facility Resident Do you feel safe going back to the place where you live?  Yes Need for family participation in patient care:  Yes (Comment)  Care giving concerns:  Patient is from [facility].   Social Worker assessment / plan:  CSW met with patient. No supports at bedside. CSW introduced role and explained that discharge planning would be discussed. Patient confirmed that she is from [facility] and plans to return when discharged. She stated that her daughter is out of town with her family and her grandson lives in [place] but might be able to transport her back to facility. CSW explained that [facility] can be called if not. No further concerns. CSW encouraged patient to contact CSW as needed. CSW will continue to follow patient for support and facilitate discharge back ALF when medically stable.  Employment status:  Retired Forensic scientist:  Medicare PT Recommendations:  Not assessed at this time Zearing / Referral to community resources:  Other (Comment Required) (Plan is to return to ALF.)  Patient/Family's Response to care:   Patient agreeable to return to ALF. Patient's family supportive and involved in patient's care. Patient appreciated social work intervention.  Patient/Family's Understanding of and Emotional Response to Diagnosis, Current Treatment, and Prognosis:  Patient understands reason for hospitalization and appears happy with care.  Emotional Assessment Appearance:  Appears stated age Attitude/Demeanor/Rapport:  Other (Pleasant) Affect (typically observed):  Accepting, Appropriate, Calm, Pleasant Orientation:  Oriented to Self, Oriented to Place, Oriented to  Time, Oriented to Situation Alcohol / Substance use:  Never Used Psych involvement (Current and /or in the community):  No (Comment)  Discharge Needs  Concerns to be addressed:  Care Coordination Readmission within the last 30 days:  No Current discharge risk:  None Barriers to Discharge:  No Barriers Identified   Candie Chroman, LCSW 11/08/2015, 1:26 PM

## 2015-11-08 NOTE — Progress Notes (Signed)
Nutrition Education Note  RD consulted for nutrition education.   Patient reports receiving low sodium diet education in the past. She states that the facility where she resides, [facility], salts their food. She prepares her own breakfast and lunch, and eats dinner at [facility]. Reviewed patient's dietary recall. Pt eats very low sodium breakfast and lunch. Dinner usually includes soup, salad, and one of five entrees. Provided examples on ways to decrease sodium intake in diet. Pt is resistant to making any changes; is unwilling to give up soup or salad dressing at dinner. RD encouraged patient to eat larger lunch at home and a smaller dinner at facility. Pt does not feel that her diet contributed to her volume overload. Pt denies any additional questions at this time.   Expect fair compliance.  Body mass index is 44.68 kg/m. Pt meets criteria for Morbid Obesity based on current BMI.  Current diet order is 2 Gram Sodium, patient is consuming approximately 75% of meals at this time. Pt reports having a good appetite. Labs and medications reviewed. No further nutrition interventions warranted at this time. If additional nutrition issues arise, please re-consult RD.   Scarlette Ar RD, LDN Inpatient Clinical Dietitian Pager: [phone] After Hours Pager: [phone]

## 2015-11-08 NOTE — Progress Notes (Signed)
Called by bedside RN regarding this patient's low blood sugar. CBG's have been in the 70's this am. Will discontinue Levemir and cover with SSI.   Also, patient has pain in right great toe, began about 5 days ago. Does not appear to be gout, but the nail is embedded into the toe with some exudative drainage. Will order doxycycline 100mg  po BID for 7 days. She will need to see podiatry outpatient for toe nail care.

## 2015-11-09 ENCOUNTER — Encounter (HOSPITAL_COMMUNITY): Payer: Self-pay | Admitting: General Practice

## 2015-11-09 LAB — GLUCOSE, CAPILLARY
GLUCOSE-CAPILLARY: 153 mg/dL — AB (ref 65–99)
GLUCOSE-CAPILLARY: 255 mg/dL — AB (ref 65–99)
Glucose-Capillary: 243 mg/dL — ABNORMAL HIGH (ref 65–99)
Glucose-Capillary: 265 mg/dL — ABNORMAL HIGH (ref 65–99)

## 2015-11-09 LAB — BASIC METABOLIC PANEL
ANION GAP: 13 (ref 5–15)
BUN: 29 mg/dL — ABNORMAL HIGH (ref 6–20)
CALCIUM: 9.4 mg/dL (ref 8.9–10.3)
CO2: 32 mmol/L (ref 22–32)
CREATININE: 1.59 mg/dL — AB (ref 0.44–1.00)
Chloride: 97 mmol/L — ABNORMAL LOW (ref 101–111)
GFR, EST AFRICAN AMERICAN: 34 mL/min — AB (ref >60)
GFR, EST NON AFRICAN AMERICAN: 29 mL/min — AB (ref >60)
Glucose, Bld: 162 mg/dL — ABNORMAL HIGH (ref 65–99)
Potassium: 4 mmol/L (ref 3.5–5.1)
SODIUM: 142 mmol/L (ref 135–145)

## 2015-11-09 LAB — MAGNESIUM: Magnesium: 2.2 mg/dL (ref 1.7–2.4)

## 2015-11-09 NOTE — Care Management Note (Signed)
Case Management Note  Patient Details  Name: Brandi Melton MRN: [id number] Date of Birth: 05-Aug-1930  Subjective/Objective:         Admitted with CHF           Action/Plan: Patient lives in an [facility] since Nov 2016; at the facility they have a nurse and physical therapy if needed. She goes to their dining room for meals. Private insurance with Medicare/ BCBS with prescription drug coverage; pt use [facility] to obtain her medication; DME - walker ; she also has scales at home and weighs herself daily. CM will continue to follow for DCP.  Expected Discharge Date:    possibly 11/11/2015              Expected Discharge Plan:  Home/Self Care  Discharge planning Services  CM Consult  Choice offered to:  Patient  Status of Service:  In process, will continue to follow  Sherrilyn Rist [phone] 11/09/2015, 4:14 PM

## 2015-11-09 NOTE — Progress Notes (Signed)
Patient Name: Brandi Melton Date of Encounter: 11/09/2015  Principal Problem:   Acute on chronic diastolic (congestive) heart failure ([place]) Active Problems:   Diabetes ([place])   Hypertension   Sleep apnea   CKD (chronic kidney disease), stage III   Morbid obesity ([place])   Acute respiratory failure with hypoxia [facility])   Primary Cardiologist: Dr. Harrington Challenger Patient Profile: Brandi Melton is a 82 year old female with a past medical history of chronic diastolic CHF, HTN, HLD, OSA, CAD, RA and CKD stage III. She presented to the office on 11/07/15 with DOE and bilateral lower extremity edema. Her o2 saturations were in the 70's with ambulation, weight was up to 255lbs when previous "dry" weight was 234lbs. She was admitted for IV diuresis.  SUBJECTIVE: Feels much better, feels like she is breathing with more ease. Denies orthopnea.   OBJECTIVE Vitals:   11/08/15 0010 11/08/15 0439 11/08/15 1952 11/09/15 0339  BP: (!) 109/41 (!) 132/54 (!) 128/54 (!) 134/40  Pulse: 70 74 72 73  Resp: 20 18 18 18   Temp: 97.9 F (36.6 C) 98 F (36.7 C) 98.3 F (36.8 C) 98.2 F (36.8 C)  TempSrc: Oral Oral Oral Oral  SpO2: 95% 92% 92% 94%  Weight:  252 lb 3.2 oz (114.4 kg)  246 lb 9.6 oz (111.9 kg)    Intake/Output Summary (Last 24 hours) at 11/09/15 1000 Last data filed at 11/09/15 0945  Gross per 24 hour  Intake              360 ml  Output             3300 ml  Net            -2940 ml   Filed Weights   11/07/15 1547 11/08/15 0439 11/09/15 0339  Weight: 253 lb 12.8 oz (115.1 kg) 252 lb 3.2 oz (114.4 kg) 246 lb 9.6 oz (111.9 kg)    PHYSICAL EXAM General: Well developed, well nourished, female in no acute distress. Head: Normocephalic, atraumatic.  Neck: Supple without bruits, cannot assess JVD. Lungs:  Resp regular and unlabored, diminished throughout Heart: RRR, S1, S2, no S3, S4, or murmur; no rub. Abdomen: Soft, non-tender, non-distended, BS + x 4.  Extremities: No clubbing, cyanosis, generalized  edema.  Neuro: Alert and oriented X 3. Moves all extremities spontaneously. Psych: Normal affect.  LABS: CBC: Recent Labs  11/07/15 1551  WBC 8.3  NEUTROABS 4.7  HGB 11.2*  HCT 37.2  MCV 99.7  PLT 123XX123   Basic Metabolic Panel: Recent Labs  11/08/15 0327 11/09/15 0429  NA 140 142  K 4.4 4.0  CL 106 97*  CO2 26 32  GLUCOSE 91 162*  BUN 25* 29*  CREATININE 1.21* 1.59*  CALCIUM 8.6* 9.4  MG 2.5* 2.2   Liver Function Tests: Recent Labs  11/07/15 1551  AST 25  ALT 24  ALKPHOS 70  BILITOT 0.6  PROT 6.6  ALBUMIN 3.3*    Thyroid Function Tests: Recent Labs  11/07/15 1551  TSH 1.824     Current Facility-Administered Medications:  .  0.9 %  sodium chloride infusion, 250 mL, Intravenous, PRN, Dayna N Dunn, PA-C .  acetaminophen (TYLENOL) tablet 650 mg, 650 mg, Oral, Q4H PRN, Dayna N Dunn, PA-C .  aspirin EC tablet 81 mg, 81 mg, Oral, Daily, Dayna N Dunn, PA-C, 81 mg at 11/08/15 0941 .  calcium carbonate (TUMS - dosed in mg elemental calcium) chewable tablet 750 mg, 750 mg,  Oral, Q24H, Burnell Blanks, MD .  carvedilol (COREG CR) 24 hr capsule 80 mg, 80 mg, Oral, Daily, Dayna N Dunn, PA-C, 80 mg at 11/08/15 1039 .  chlorhexidine (PERIDEX) 0.12 % solution 5 mL, 5 mL, Mouth/Throat, BID, Dayna N Dunn, PA-C, 5 mL at 11/08/15 2211 .  cholecalciferol (VITAMIN D) tablet 3,000 Units, 3,000 Units, Oral, Daily, Dayna N Dunn, PA-C, 3,000 Units at 11/08/15 0941 .  doxycycline (VIBRA-TABS) tablet 100 mg, 100 mg, Oral, Q12H, Arbutus Leas, NP, 100 mg at 11/08/15 2209 .  ezetimibe-simvastatin (VYTORIN) 10-40 MG per tablet 1 tablet, 1 tablet, Oral, Daily, Dayna N Dunn, PA-C, 1 tablet at 11/08/15 1039 .  folic acid (FOLVITE) tablet 0.5 mg, 500 mcg, Oral, Daily, Dayna N Dunn, PA-C, 0.5 mg at 11/08/15 1039 .  furosemide (LASIX) injection 80 mg, 80 mg, Intravenous, BID, Dayna N Dunn, PA-C, 80 mg at 11/09/15 0851 .  heparin injection 5,000 Units, 5,000 Units, Subcutaneous, Q8H,  Dayna N Dunn, PA-C, 5,000 Units at 11/09/15 0603 .  insulin aspart (novoLOG) injection 0-9 Units, 0-9 Units, Subcutaneous, TID WC, Dayna N Dunn, PA-C, 2 Units at 11/09/15 0604 .  irbesartan (AVAPRO) tablet 75 mg, 75 mg, Oral, Daily, Dayna N Dunn, PA-C, 75 mg at 11/08/15 1039 .  magnesium oxide (MAG-OX) tablet 400 mg, 400 mg, Oral, Daily, Dayna N Dunn, PA-C, 400 mg at 11/08/15 0941 .  ondansetron (ZOFRAN) injection 4 mg, 4 mg, Intravenous, Q6H PRN, Dayna N Dunn, PA-C .  potassium chloride SA (K-DUR,KLOR-CON) CR tablet 20 mEq, 20 mEq, Oral, Daily, Dayna N Dunn, PA-C, 20 mEq at 11/08/15 0941 .  sodium chloride flush (NS) 0.9 % injection 3 mL, 3 mL, Intravenous, Q12H, Dayna N Dunn, PA-C, 3 mL at 11/08/15 2212 .  sodium chloride flush (NS) 0.9 % injection 3 mL, 3 mL, Intravenous, PRN, Dayna N Dunn, PA-C .  [START ON 11/14/2015] Vitamin D (Ergocalciferol) (DRISDOL) capsule 50,000 Units, 50,000 Units, Oral, Q Wed, Dayna N Dunn, PA-C   TELE:  NSR      ECG: NSR  Radiology/Studies: Dg Chest 2 View  Result Date: 11/08/2015 CLINICAL DATA:  Shortness of breath, cough, hypoxia, acute on chronic CHF, morbid obesity EXAM: CHEST  2 VIEW COMPARISON:  None in PACs FINDINGS: The lungs are borderline hypoinflated. There is lingular atelectasis or infiltrate. There is no pleural effusion. The heart is top-normal in size. The pulmonary vascularity is not engorged. There is calcification in the wall of the aortic arch. There is multilevel degenerative disc disease of the thoracic spine. IMPRESSION: Lingular atelectasis or interstitial pneumonia. Cardiomegaly without pulmonary edema or pleural effusion. Aortic atherosclerosis. Electronically Signed   By: David  Martinique M.D.   On: 11/08/2015 07:42    Current Medications:  . aspirin EC  81 mg Oral Daily  . calcium carbonate  750 mg Oral Q24H  . carvedilol  80 mg Oral Daily  . chlorhexidine  5 mL Mouth/Throat BID  . cholecalciferol  3,000 Units Oral Daily  . doxycycline   100 mg Oral Q12H  . ezetimibe-simvastatin  1 tablet Oral Daily  . folic acid  [id number] mcg Oral Daily  . furosemide  80 mg Intravenous BID  . heparin  5,000 Units Subcutaneous Q8H  . insulin aspart  0-9 Units Subcutaneous TID WC  . irbesartan  75 mg Oral Daily  . magnesium oxide  400 mg Oral Daily  . potassium chloride SA  20 mEq Oral Daily  . sodium chloride flush  3 mL  Intravenous Q12H  . [START ON 11/14/2015] Vitamin D (Ergocalciferol)  50,000 Units Oral Q Wed      ASSESSMENT AND PLAN: Principal Problem:   Acute on chronic diastolic (congestive) heart failure (HCC) Active Problems:   Diabetes ([place])   Hypertension   Sleep apnea   CKD (chronic kidney disease), stage III   Morbid obesity ([place])   Acute respiratory failure with hypoxia ([place])  1. Acute on chronic diastolic CHF: Diuresed well yesterday, -3.4L for admission. Looks much better, tachypnea resolved. Still needs IV diuresis today. Will continue Lasix 80mg  BID, creatinine small increase from yesterday. Will get BMP in am. Continue fluid restriction and daily weights.  Continue Coreg.   2. HTN: Normotensive, continue ARB.   3. DM: On sliding scale insulin. Continue same. Had hypoglycemic events yesterday, long acting insulin discontinued.   4. CKD stage III: Appears baseline creatinine is 1.3-1.5. Stable.   5. Toenail infection: Right great toenail appeared infectious yesterday, Doxycycline started. She says it feels better today, less painful . Should follow with podiatry for foot care.     Signed, Arbutus Leas , NP 10:00 AM 11/09/2015 Pager [phone] Patient seen and examined. I agree with the assessment and plan as detailed above. See also my additional thoughts below.   The patient is diuresing. I agree with the node is outlined above. The plan will be to continue diuresis with careful attention to her renal function.  Dola Argyle, MD, [facility] 11/09/2015 10:34 AM

## 2015-11-09 NOTE — Progress Notes (Signed)
Inpatient Diabetes Program Recommendations  AACE/ADA: New Consensus Statement on Inpatient Glycemic Control (2015)  Target Ranges:  Prepandial:   less than 140 mg/dL      Peak postprandial:   less than 180 mg/dL (1-2 hours)      Critically ill patients:  140 - 180 mg/dL   Inpatient Diabetes Program Recommendations  AACE/ADA: New Consensus Statement on Inpatient Glycemic Control (2015)  Target Ranges:  Prepandial:   less than 140 mg/dL      Peak postprandial:   less than 180 mg/dL (1-2 hours)      Critically ill patients:  140 - 180 mg/dL   Lab Results  Component Value Date   GLUCAP 243 (H) 11/09/2015   HGBA1C 8.5 (H) 07/02/2015    Review of Glycemic Control  Diabetes history: DM 2 Outpatient Diabetes medications: Lantus 80 units bid, novolog 30 units 4 times/day. Current orders for Inpatient glycemic control: Sensitive correction tidwc.  Inpatient Diabetes Program Recommendations:    Noted patient had cbg of 73 following dose of levemir 80 units the HS prior. Noted all basal insulin has now been discontinued. Patient does need at least a percentage of home dose levemir to meet basal needs. Please consider 20 units levemir at HS to start.  Thank you Brandi Kea, RN, MSN, CDE  Diabetes Inpatient Program Office: [phone] Pager: [phone] 8:00 am to 5:00 pm

## 2015-11-09 NOTE — Clinical Social Work Note (Signed)
CSW called [facility]. Patient is an independent living resident. RNCM notified.   CSW signing off. Consult again if any social work needs arise.  Brandi Melton, [place]

## 2015-11-10 LAB — GLUCOSE, CAPILLARY
GLUCOSE-CAPILLARY: 289 mg/dL — AB (ref 65–99)
GLUCOSE-CAPILLARY: 317 mg/dL — AB (ref 65–99)
Glucose-Capillary: 232 mg/dL — ABNORMAL HIGH (ref 65–99)
Glucose-Capillary: 332 mg/dL — ABNORMAL HIGH (ref 65–99)

## 2015-11-10 LAB — BASIC METABOLIC PANEL
Anion gap: 8 (ref 5–15)
BUN: 37 mg/dL — AB (ref 6–20)
CO2: 33 mmol/L — ABNORMAL HIGH (ref 22–32)
CREATININE: 1.79 mg/dL — AB (ref 0.44–1.00)
Calcium: 9.4 mg/dL (ref 8.9–10.3)
Chloride: 97 mmol/L — ABNORMAL LOW (ref 101–111)
GFR calc Af Amer: 29 mL/min — ABNORMAL LOW (ref >60)
GFR, EST NON AFRICAN AMERICAN: 25 mL/min — AB (ref >60)
GLUCOSE: 234 mg/dL — AB (ref 65–99)
POTASSIUM: 4.1 mmol/L (ref 3.5–5.1)
SODIUM: 138 mmol/L (ref 135–145)

## 2015-11-10 LAB — MAGNESIUM: Magnesium: 2 mg/dL (ref 1.7–2.4)

## 2015-11-10 NOTE — Progress Notes (Signed)
Patient ID: MIIA ABURTO, female   DOB: 03-08-1931, 82 y.o.   MRN: [id number]    Patient Name: Brandi Melton Date of Encounter: 11/10/2015     Principal Problem:   Acute on chronic diastolic (congestive) heart failure (HCC) Active Problems:   Diabetes ([place])   Hypertension   Sleep apnea   CKD (chronic kidney disease), stage III   Morbid obesity ([place])   Acute respiratory failure with hypoxia (H[facility])    SUBJECTIVE  No chest pain. Dyspnea improved but not back to baseline.  CURRENT MEDS . aspirin EC  81 mg Oral Daily  . calcium carbonate  750 mg Oral Q24H  . carvedilol  80 mg Oral Daily  . chlorhexidine  5 mL Mouth/Throat BID  . cholecalciferol  3,000 Units Oral Daily  . doxycycline  100 mg Oral Q12H  . ezetimibe-simvastatin  1 tablet Oral Daily  . folic acid  [id number] mcg Oral Daily  . furosemide  80 mg Intravenous BID  . heparin  5,000 Units Subcutaneous Q8H  . insulin aspart  0-9 Units Subcutaneous TID WC  . irbesartan  75 mg Oral Daily  . magnesium oxide  400 mg Oral Daily  . potassium chloride SA  20 mEq Oral Daily  . sodium chloride flush  3 mL Intravenous Q12H  . [START ON 11/14/2015] Vitamin D (Ergocalciferol)  50,000 Units Oral Q Wed    OBJECTIVE  Vitals:   11/09/15 1424 11/09/15 1944 11/09/15 1949 11/10/15 0615  BP: 118/83 (!) 99/39 (!) 103/32 (!) 110/39  Pulse: 71 70 69 64  Resp: 18 20  20   Temp: 98.3 F (36.8 C) 98.6 F (37 C)  97.9 F (36.6 C)  TempSrc: Oral Oral  Oral  SpO2: 91% 96%  90%  Weight:    243 lb 9.6 oz (110.5 kg)    Intake/Output Summary (Last 24 hours) at 11/10/15 1039 Last data filed at 11/10/15 1008  Gross per 24 hour  Intake              963 ml  Output             2350 ml  Net            -1387 ml   Filed Weights   11/08/15 0439 11/09/15 0339 11/10/15 0615  Weight: 252 lb 3.2 oz (114.4 kg) 246 lb 9.6 oz (111.9 kg) 243 lb 9.6 oz (110.5 kg)    PHYSICAL EXAM  General: Pleasant, anxious obese woman, NAD. Neuro: Alert and oriented X 3.  Moves all extremities spontaneously. HEENT:  Normal  Neck: Supple without bruits, 7 cm JVD. Lungs:  Resp regular and unlabored, CTA. Heart: RRR no s3, s4, or murmurs distant heart sounds Abdomen: Soft, non-tender, non-distended, BS + x 4.  Extremities: No clubbing, cyanosis or edema. DP/PT/Radials 2+ and equal bilaterally.  Accessory Clinical Findings  CBC  Recent Labs  11/07/15 1551  WBC 8.3  NEUTROABS 4.7  HGB 11.2*  HCT 37.2  MCV 99.7  PLT 123XX123   Basic Metabolic Panel  Recent Labs  11/09/15 0429 11/10/15 0247  NA 142 138  K 4.0 4.1  CL 97* 97*  CO2 32 33*  GLUCOSE 162* 234*  BUN 29* 37*  CREATININE 1.59* 1.79*  CALCIUM 9.4 9.4  MG 2.2 2.0   Liver Function Tests  Recent Labs  11/07/15 1551  AST 25  ALT 24  ALKPHOS 70  BILITOT 0.6  PROT 6.6  ALBUMIN 3.3*   No results for  input(s): LIPASE, AMYLASE in the last 72 hours. Cardiac Enzymes No results for input(s): CKTOTAL, CKMB, CKMBINDEX, TROPONINI in the last 72 hours. BNP Invalid input(s): POCBNP D-Dimer No results for input(s): DDIMER in the last 72 hours. Hemoglobin A1C No results for input(s): HGBA1C in the last 72 hours. Fasting Lipid Panel No results for input(s): CHOL, HDL, LDLCALC, TRIG, CHOLHDL, LDLDIRECT in the last 72 hours. Thyroid Function Tests  Recent Labs  11/07/15 1551  TSH 1.824    TELE  nsr  Radiology/Studies  Dg Chest 2 View  Result Date: 11/08/2015 CLINICAL DATA:  Shortness of breath, cough, hypoxia, acute on chronic CHF, morbid obesity EXAM: CHEST  2 VIEW COMPARISON:  None in PACs FINDINGS: The lungs are borderline hypoinflated. There is lingular atelectasis or infiltrate. There is no pleural effusion. The heart is top-normal in size. The pulmonary vascularity is not engorged. There is calcification in the wall of the aortic arch. There is multilevel degenerative disc disease of the thoracic spine. IMPRESSION: Lingular atelectasis or interstitial pneumonia. Cardiomegaly  without pulmonary edema or pleural effusion. Aortic atherosclerosis. Electronically Signed   By: David  Martinique M.D.   On: 11/08/2015 07:42   ASSESSMENT AND PLAN  1. Acute on chronic diastolic heart failure - her weight continues down but she is not yet euvolemic. Continue IV lasix. 2. Acute on chronic renal insuf - her creatinine up but she is not yet dry. Will follow. 3. Obesity - weight loss would be helpful 4. HTN heart disease - her blood pressure is well controlled. Will follow.  Gregg Taylor,M.D.  11/10/2015 10:39 AM

## 2015-11-10 NOTE — Evaluation (Signed)
Physical Therapy Evaluation Patient Details Name: Brandi Melton MRN: [id number] DOB: 1931/02/08 Today's Date: 11/10/2015   History of Present Illness  Brandi Melton is a 82 year old female with a past medical history of chronic diastolic CHF, HTN, HLD, OSA, CAD, RA and CKD stage III. She presented to the office on 11/07/15 with DOE and bilateral lower extremity edema. Her o2 saturations were in the 70's with ambulation. She was admitted for IV diuresis.  Clinical Impression  Pt admitted with above diagnosis. Pt currently with functional limitations due to the deficits listed below (see PT Problem List). Mobilizing at a supervision level for safety. Easily fatigued, with SpO2 down to 85% on room air while ambulating, up to lower 90s with 1L supplemental O2, and 99% saturation with 2L supplemental O2. She will benefit from follow-up PT at her independent living facility due to deconditioning. Pt will benefit from skilled PT to increase their independence and safety with mobility to allow discharge to the venue listed below.       Follow Up Recommendations Home health PT    Equipment Recommendations  None recommended by PT    Recommendations for Other Services OT consult     Precautions / Restrictions Precautions Precautions: Fall;Other (comment) (Monitor O2) Precaution Comments: monitor O2 Restrictions Weight Bearing Restrictions: No      Mobility  Bed Mobility Overal bed mobility: Needs Assistance Bed Mobility: Supine to Sit     Supine to sit: Supervision     General bed mobility comments: Supervision for safety, requires extra time, effortful but capable of performing without assist.  Transfers Overall transfer level: Needs assistance Equipment used: Rolling walker (2 wheeled) Transfers: Sit to/from Stand Sit to Stand: Supervision         General transfer comment: Supervision for safety. VC for hand placement. Slow to rise but steady once upright with UE  support.  Ambulation/Gait Ambulation/Gait assistance: Supervision Ambulation Distance (Feet): 100 Feet Assistive device: Rolling walker (2 wheeled) Gait Pattern/deviations: Step-through pattern;Decreased stride length;Trunk flexed Gait velocity: decreased Gait velocity interpretation: <1.8 ft/sec, indicative of risk for recurrent falls General Gait Details: VC for walker placement for proximity.  Cues for pursed lip breathing. Required several standing rest breaks to complete distance (see vitals below.) No overt buckling or loss of balance noted. Supervision provided for safety.  Stairs            Wheelchair Mobility    Modified Rankin (Stroke Patients Only)       Balance Overall balance assessment: Needs assistance Sitting-balance support: No upper extremity supported;Feet supported Sitting balance-Leahy Scale: Good     Standing balance support: No upper extremity supported Standing balance-Leahy Scale: Fair                               Pertinent Vitals/Pain Pain Assessment: No/denies pain    Home Living Family/patient expects to be discharged to:: Other (Comment) (ILF) Living Arrangements: Alone (From ILF) Available Help at Discharge: Other (Comment) (From ILF) Type of Home: Independent living facility Home Access: Level entry     Home Layout: One level Home Equipment: Bedside commode;Shower seat;Walker - 4 wheels      Prior Function Level of Independence: Independent with assistive device(s)         Comments: Using rollator for mobility, walks to dining hall.     Hand Dominance   Dominant Hand: Right    Extremity/Trunk Assessment   Upper Extremity Assessment:  Defer to OT evaluation           Lower Extremity Assessment: Generalized weakness         Communication   Communication: No difficulties  Cognition Arousal/Alertness: Awake/alert Behavior During Therapy: WFL for tasks assessed/performed Overall Cognitive Status:  Within Functional Limits for tasks assessed                      General Comments General comments (skin integrity, edema, etc.): Resting SpO2 90% on room air. Desats to 85% while ambulating on room air. 92% SpO2 with 1L supplemental O2 while ambulating and 99% on 2L.    Exercises General Exercises - Lower Extremity Ankle Circles/Pumps: AROM;Both;10 reps;Seated      Assessment/Plan    PT Assessment Patient needs continued PT services  PT Diagnosis Acute pain;Difficulty walking;Abnormality of gait;Generalized weakness   PT Problem List Decreased strength;Decreased activity tolerance;Decreased balance;Decreased mobility;Cardiopulmonary status limiting activity;Obesity  PT Treatment Interventions DME instruction;Gait training;Functional mobility training;Therapeutic activities;Therapeutic exercise;Balance training;Patient/family education   PT Goals (Current goals can be found in the Care Plan section) Acute Rehab PT Goals Patient Stated Goal: Get stronger PT Goal Formulation: With patient Time For Goal Achievement: 11/24/15 Potential to Achieve Goals: Good    Frequency Min 3X/week   Barriers to discharge        Co-evaluation               End of Session Equipment Utilized During Treatment: Gait belt;Oxygen Activity Tolerance: Patient limited by fatigue Patient left: in chair;with call bell/phone within reach;with chair alarm set Nurse Communication: Mobility status;Other (comment) (O2 sats)         Time: 1030-1101 PT Time Calculation (min) (ACUTE ONLY): 31 min   Charges:   PT Evaluation $PT Eval Moderate Complexity: 1 Procedure PT Treatments $Gait Training: 8-22 mins   PT G Codes:        Ellouise Newer 11/10/2015, 11:45 AM  Elayne Snare, [place]  (weekend pager)

## 2015-11-11 LAB — BASIC METABOLIC PANEL
ANION GAP: 9 (ref 5–15)
BUN: 51 mg/dL — ABNORMAL HIGH (ref 6–20)
CO2: 32 mmol/L (ref 22–32)
Calcium: 9.4 mg/dL (ref 8.9–10.3)
Chloride: 96 mmol/L — ABNORMAL LOW (ref 101–111)
Creatinine, Ser: 1.84 mg/dL — ABNORMAL HIGH (ref 0.44–1.00)
GFR calc Af Amer: 28 mL/min — ABNORMAL LOW (ref >60)
GFR calc non Af Amer: 24 mL/min — ABNORMAL LOW (ref >60)
GLUCOSE: 252 mg/dL — AB (ref 65–99)
POTASSIUM: 3.8 mmol/L (ref 3.5–5.1)
Sodium: 137 mmol/L (ref 135–145)

## 2015-11-11 LAB — MAGNESIUM: Magnesium: 2.1 mg/dL (ref 1.7–2.4)

## 2015-11-11 LAB — GLUCOSE, CAPILLARY
GLUCOSE-CAPILLARY: 241 mg/dL — AB (ref 65–99)
GLUCOSE-CAPILLARY: 266 mg/dL — AB (ref 65–99)
GLUCOSE-CAPILLARY: 292 mg/dL — AB (ref 65–99)
Glucose-Capillary: 374 mg/dL — ABNORMAL HIGH (ref 65–99)

## 2015-11-11 NOTE — Progress Notes (Signed)
Patient ID: MARIO YOHN, female   DOB: [birth year], 82 y.o.   MRN: [id number]    Patient Name: Brandi Melton Date of Encounter: 11/11/2015     Principal Problem:   Acute on chronic diastolic (congestive) heart failure (HCC) Active Problems:   Diabetes ([place])   Hypertension   Sleep apnea   CKD (chronic kidney disease), stage III   Morbid obesity ([place])   Acute respiratory failure with hypoxia (HCC)    SUBJECTIVE Dyspnea improved. No chest pain. "I don't want to go home until the fluid is out"   CURRENT MEDS . aspirin EC  81 mg Oral Daily  . calcium carbonate  750 mg Oral Q24H  . carvedilol  80 mg Oral Daily  . chlorhexidine  5 mL Mouth/Throat BID  . cholecalciferol  3,000 Units Oral Daily  . doxycycline  100 mg Oral Q12H  . ezetimibe-simvastatin  1 tablet Oral Daily  . folic acid  [id number] mcg Oral Daily  . furosemide  80 mg Intravenous BID  . heparin  5,000 Units Subcutaneous Q8H  . insulin aspart  0-9 Units Subcutaneous TID WC  . irbesartan  75 mg Oral Daily  . magnesium oxide  400 mg Oral Daily  . potassium chloride SA  20 mEq Oral Daily  . sodium chloride flush  3 mL Intravenous Q12H  . [START ON 11/14/2015] Vitamin D (Ergocalciferol)  50,000 Units Oral Q Wed    OBJECTIVE  Vitals:   11/10/15 0615 11/10/15 1300 11/10/15 2044 11/11/15 0508  BP: (!) 110/39 139/66 (!) 113/38 (!) 116/34  Pulse: 64 78 72 72  Resp: 20 18 18 18   Temp: 97.9 F (36.6 C) 97.5 F (36.4 C) 98.1 F (36.7 C) 98.3 F (36.8 C)  TempSrc: Oral Oral Oral Oral  SpO2: 90% 99% 90% 92%  Weight: 243 lb 9.6 oz (110.5 kg)   243 lb 11.2 oz (110.5 kg)    Intake/Output Summary (Last 24 hours) at 11/11/15 1000 Last data filed at 11/11/15 0949  Gross per 24 hour  Intake             1203 ml  Output             2751 ml  Net            -1548 ml   Filed Weights   11/09/15 0339 11/10/15 0615 11/11/15 0508  Weight: 246 lb 9.6 oz (111.9 kg) 243 lb 9.6 oz (110.5 kg) 243 lb 11.2 oz (110.5 kg)    PHYSICAL  EXAM  General: Pleasant, obese elderly woman, NAD. Neuro: Alert and oriented X 3. Moves all extremities spontaneously. Psych: Normal affect. HEENT:  Normal  Neck: Supple without bruits or JVD. Lungs:  Resp regular and unlabored, CTA. Heart: RRR no s3, s4, or murmurs. Abdomen: Soft, non-tender, non-distended, BS + x 4.  Extremities: No clubbing, cyanosis or edema. DP/PT/Radials 2+ and equal bilaterally.  Accessory Clinical Findings  CBC No results for input(s): WBC, NEUTROABS, HGB, HCT, MCV, PLT in the last 72 hours. Basic Metabolic Panel  Recent Labs  11/10/15 0247 11/11/15 0400  NA 138 137  K 4.1 3.8  CL 97* 96*  CO2 33* 32  GLUCOSE 234* 252*  BUN 37* 51*  CREATININE 1.79* 1.84*  CALCIUM 9.4 9.4  MG 2.0 2.1   Liver Function Tests No results for input(s): AST, ALT, ALKPHOS, BILITOT, PROT, ALBUMIN in the last 72 hours. No results for input(s): LIPASE, AMYLASE in the last 72 hours. Cardiac Enzymes  No results for input(s): CKTOTAL, CKMB, CKMBINDEX, TROPONINI in the last 72 hours. BNP Invalid input(s): POCBNP D-Dimer No results for input(s): DDIMER in the last 72 hours. Hemoglobin A1C No results for input(s): HGBA1C in the last 72 hours. Fasting Lipid Panel No results for input(s): CHOL, HDL, LDLCALC, TRIG, CHOLHDL, LDLDIRECT in the last 72 hours. Thyroid Function Tests No results for input(s): TSH, T4TOTAL, T3FREE, THYROIDAB in the last 72 hours.  Invalid input(s): FREET3  TELE  nsr  Radiology/Studies  Dg Chest 2 View  Result Date: 11/08/2015 CLINICAL DATA:  Shortness of breath, cough, hypoxia, acute on chronic CHF, morbid obesity EXAM: CHEST  2 VIEW COMPARISON:  None in PACs FINDINGS: The lungs are borderline hypoinflated. There is lingular atelectasis or infiltrate. There is no pleural effusion. The heart is top-normal in size. The pulmonary vascularity is not engorged. There is calcification in the wall of the aortic arch. There is multilevel degenerative  disc disease of the thoracic spine. IMPRESSION: Lingular atelectasis or interstitial pneumonia. Cardiomegaly without pulmonary edema or pleural effusion. Aortic atherosclerosis. Electronically Signed   By: David  Martinique M.D.   On: 11/08/2015 07:42   ASSESSMENT AND PLAN  1. Acute on chronic systolic heart failure - her weight did not change over night despite negative I/O's. Continue IV lasix and follow weight and renal function. She is nearly euvolemic. 2. Acute on chronic renal insuff. - her creatinine is essentially unchanged. Will follow. 3. HTN heart disease - her blood pressure is well controlled.  Katara Griner,M.D.  11/11/2015 10:00 AM

## 2015-11-12 DIAGNOSIS — I10 Essential (primary) hypertension: Secondary | ICD-10-CM

## 2015-11-12 LAB — MAGNESIUM: Magnesium: 2.1 mg/dL (ref 1.7–2.4)

## 2015-11-12 LAB — BASIC METABOLIC PANEL
Anion gap: 9 (ref 5–15)
BUN: 58 mg/dL — AB (ref 6–20)
CHLORIDE: 95 mmol/L — AB (ref 101–111)
CO2: 32 mmol/L (ref 22–32)
CREATININE: 1.77 mg/dL — AB (ref 0.44–1.00)
Calcium: 9.4 mg/dL (ref 8.9–10.3)
GFR calc non Af Amer: 26 mL/min — ABNORMAL LOW (ref >60)
GFR, EST AFRICAN AMERICAN: 30 mL/min — AB (ref >60)
Glucose, Bld: 269 mg/dL — ABNORMAL HIGH (ref 65–99)
POTASSIUM: 4.2 mmol/L (ref 3.5–5.1)
Sodium: 136 mmol/L (ref 135–145)

## 2015-11-12 LAB — GLUCOSE, CAPILLARY
GLUCOSE-CAPILLARY: 241 mg/dL — AB (ref 65–99)
GLUCOSE-CAPILLARY: 219 mg/dL — AB (ref 65–99)
GLUCOSE-CAPILLARY: 294 mg/dL — AB (ref 65–99)
GLUCOSE-CAPILLARY: 300 mg/dL — AB (ref 65–99)
Glucose-Capillary: 323 mg/dL — ABNORMAL HIGH (ref 65–99)

## 2015-11-12 MED ORDER — FUROSEMIDE 10 MG/ML IJ SOLN
80.0000 mg | Freq: Two times a day (BID) | INTRAMUSCULAR | Status: DC
  Administered 2015-11-12 – 2015-11-16 (×8): 80 mg via INTRAVENOUS
  Filled 2015-11-12 (×8): qty 8

## 2015-11-12 MED ORDER — INSULIN DETEMIR 100 UNIT/ML ~~LOC~~ SOLN
40.0000 [IU] | Freq: Every day | SUBCUTANEOUS | Status: DC
  Administered 2015-11-12 – 2015-11-13 (×2): 40 [IU] via SUBCUTANEOUS
  Filled 2015-11-12 (×2): qty 0.4

## 2015-11-12 MED ORDER — FUROSEMIDE 80 MG PO TABS: 80.0000 mg | ORAL_TABLET | Freq: Two times a day (BID) | ORAL | Status: DC

## 2015-11-12 NOTE — Progress Notes (Signed)
Patient Name: Brandi Melton Date of Encounter: 11/12/2015  Hospital Problem List     Principal Problem:   Acute on chronic diastolic (congestive) heart failure (HCC) Active Problems:   Diabetes ([place])   Hypertension   Sleep apnea   CKD (chronic kidney disease), stage III   Morbid obesity ([place])   Acute respiratory failure with hypoxia (HCC)    Subjective   Feeling much better today. Sitting up in chair   Inpatient Medications    . aspirin EC  81 mg Oral Daily  . calcium carbonate  750 mg Oral Q24H  . carvedilol  80 mg Oral Daily  . chlorhexidine  5 mL Mouth/Throat BID  . cholecalciferol  3,000 Units Oral Daily  . doxycycline  100 mg Oral Q12H  . ezetimibe-simvastatin  1 tablet Oral Daily  . folic acid  [id number] mcg Oral Daily  . furosemide  80 mg Intravenous BID  . heparin  5,000 Units Subcutaneous Q8H  . insulin aspart  0-9 Units Subcutaneous TID WC  . irbesartan  75 mg Oral Daily  . magnesium oxide  400 mg Oral Daily  . potassium chloride SA  20 mEq Oral Daily  . sodium chloride flush  3 mL Intravenous Q12H  . [START ON 11/14/2015] Vitamin D (Ergocalciferol)  50,000 Units Oral Q Wed    Vital Signs    Vitals:   11/11/15 0955 11/11/15 1215 11/11/15 2209 11/12/15 0315  BP: (!) 127/54 114/60 (!) 111/45 (!) 122/49  Pulse: 65 68 72 67  Resp: 18 18 (!) 22 20  Temp: 98 F (36.7 C) 98 F (36.7 C) 98.4 F (36.9 C) 97.7 F (36.5 C)  TempSrc:  Oral Oral Oral  SpO2: 94% 93% 93% 95%  Weight:    242 lb 12.8 oz (110.1 kg)    Intake/Output Summary (Last 24 hours) at 11/12/15 1124 Last data filed at 11/12/15 1042  Gross per 24 hour  Intake              785 ml  Output             1602 ml  Net             -817 ml   Filed Weights   11/10/15 0615 11/11/15 0508 11/12/15 0315  Weight: 243 lb 9.6 oz (110.5 kg) 243 lb 11.2 oz (110.5 kg) 242 lb 12.8 oz (110.1 kg)    Physical Exam    General: Pleasant obese female, NAD. Neuro: Alert and oriented X 3. Moves all extremities  spontaneously. Psych: Normal affect. HEENT:  Normal  Neck: Supple without bruits or JVD. Lungs:  Resp regular and unlabored, Diminished in lower lobes. Heart: RRR no s3, s4, or murmurs. Abdomen: Soft, non-tender, non-distended, BS + x 4.  Extremities: No clubbing, cyanosis or edema. DP/PT/Radials 2+ and equal bilaterally.  Labs    CBC No results for input(s): WBC, NEUTROABS, HGB, HCT, MCV, PLT in the last 72 hours. Basic Metabolic Panel  Recent Labs  11/11/15 0400 11/12/15 0347  NA 137 136  K 3.8 4.2  CL 96* 95*  CO2 32 32  GLUCOSE 252* 269*  BUN 51* 58*  CREATININE 1.84* 1.77*  CALCIUM 9.4 9.4  MG 2.1 2.1   Liver Function Tests No results for input(s): AST, ALT, ALKPHOS, BILITOT, PROT, ALBUMIN in the last 72 hours. No results for input(s): LIPASE, AMYLASE in the last 72 hours. Cardiac Enzymes No results for input(s): CKTOTAL, CKMB, CKMBINDEX, TROPONINI in the last 72  hours. BNP Invalid input(s): POCBNP D-Dimer No results for input(s): DDIMER in the last 72 hours. Hemoglobin A1C No results for input(s): HGBA1C in the last 72 hours. Fasting Lipid Panel No results for input(s): CHOL, HDL, LDLCALC, TRIG, CHOLHDL, LDLDIRECT in the last 72 hours. Thyroid Function Tests No results for input(s): TSH, T4TOTAL, T3FREE, THYROIDAB in the last 72 hours.  Invalid input(s): FREET3  Telemetry    SR  ECG    No recent  Radiology    Dg Chest 2 View  Result Date: 11/08/2015 CLINICAL DATA:  Shortness of breath, cough, hypoxia, acute on chronic CHF, morbid obesity EXAM: CHEST  2 VIEW COMPARISON:  None in PACs FINDINGS: The lungs are borderline hypoinflated. There is lingular atelectasis or infiltrate. There is no pleural effusion. The heart is top-normal in size. The pulmonary vascularity is not engorged. There is calcification in the wall of the aortic arch. There is multilevel degenerative disc disease of the thoracic spine. IMPRESSION: Lingular atelectasis or interstitial  pneumonia. Cardiomegaly without pulmonary edema or pleural effusion. Aortic atherosclerosis. Electronically Signed   By: David  Martinique M.D.   On: 11/08/2015 07:42   Assessment & Plan     Brandi Melton is a 82 year old female with a past medical history of chronic diastolic CHF, HTN, HLD, OSA, CAD, RA and CKD stage III. She presented to the office on 11/07/15 with DOE and bilateral lower extremity edema. Her o2 saturations were in the 70's with ambulation, weight was up to 255lbs when previous "dry" weight was 234lbs. She was admitted for IV diuresis.  1. Acute on chronic diastolic CHF: She has diuresed well this admission on IV lasix. Weight is down 253>>242 (dry weight 234lbs). She is nearing euvolemia but I think her weight is still too high to change to PO lasix. Will continue IV lasix one more day. Cr actually improved today at 1.77.  States she has ambulated in the hallway a fairly good distance but still with mild DOE.   2. Acute on chronic renal insuff: Cr with slight improvement today. Continue to follow.  3. HTN: Blood pressure is stable. Continue current medications.  4. Right toenail infection: Started on doxycycline 7/27 with plans to continue for 7 days. Much improved.   Signed, Reino Bellis NP-C Pager [phone]  Patient seen and independently examined with Reino Bellis NP. We discussed all aspects of the encounter. I agree with the assessment and plan as stated above with minor changes.  I think she is near euvolemia but still weight is up.  Creatinine improved from yesterday.  Will continue 1 more day of IV diuretics and reassess in am.  Hopefully home tomorrow.   Signed: Fransico Him, MD [facility] 11/12/2015

## 2015-11-12 NOTE — Progress Notes (Signed)
Inpatient Diabetes Program Recommendations  AACE/ADA: New Consensus Statement on Inpatient Glycemic Control (2015)  Target Ranges:  Prepandial:   less than 140 mg/dL      Peak postprandial:   less than 180 mg/dL (1-2 hours)      Critically ill patients:  140 - 180 mg/dL   Lab Results  Component Value Date   GLUCAP 294 (H) 11/12/2015   HGBA1C 8.5 (H) 07/02/2015    Review of Glycemic Control:  Results for JMIYAH, BOGDANOWICZ (MRN [id number]) as of 11/12/2015 13:23  Ref. Range 11/11/2015 16:29 11/11/2015 21:38 11/12/2015 06:54 11/12/2015 07:43 11/12/2015 12:24  Glucose-Capillary Latest Ref Range: 65 - 99 mg/dL 292 (H) 374 (H) 241 (H) 219 (H) 294 (H)   Diabetes history: Type 2 diabetes Outpatient Diabetes medications: Levemir 80 units bid, Novolog 30 units four times daily Current orders for Inpatient glycemic control:  Novolog sensitive tid with meals  Inpatient Diabetes Program Recommendations:    Note that patient on basal insulin prior to admit.  Called and discussed with PA.  Order received to resume Levemir 40 units daily.  Will follow.  Thanks, Adah Perl, RN, BC-ADM Inpatient Diabetes Coordinator Pager [phone] (8a-5p)

## 2015-11-12 NOTE — Clinical Social Work Note (Signed)
CSW acknowledges consult regarding patient living at [place]. RNCM following for discharge planning.  CSW signing off. Consult again if any social work needs arise.  Dayton Scrape, [place]

## 2015-11-12 NOTE — Progress Notes (Signed)
Pt a/o, no c/o pain, VSS, pt stable

## 2015-11-12 NOTE — Progress Notes (Signed)
Physical Therapy Treatment Patient Details Name: Brandi Melton MRN: [id number] DOB: [birth year] Today's Date: 11/12/2015    History of Present Illness Ms. Stuewe is a 82 year old female with a past medical history of chronic diastolic CHF, HTN, HLD, OSA, CAD, RA and CKD stage III. She presented to the office on 11/07/15 with DOE and bilateral lower extremity edema. Her o2 saturations were in the 70's with ambulation. She was admitted for IV diuresis.    PT Comments    Patient progressing well towards PT goals. This session focused on cardiovascular endurance and muscular endurance. Improved ambulation distance with only 2 seated rest breaks but encouraged pt to tolerate standing rest breaks as tolerated during activity to stress system. Sp02 ranged from 85-93% on room air. Education re: importance of mobility and utilization/uptake of 02. Encouraged ambulation while in hospital with RN multiple times/day. Pt anxious about going home too early due to feeling like she still has water weight to lose. Will follow.   Follow Up Recommendations  Home health PT     Equipment Recommendations  None recommended by PT    Recommendations for Other Services       Precautions / Restrictions Precautions Precautions: Fall Precaution Comments: monitor O2 Restrictions Weight Bearing Restrictions: No    Mobility  Bed Mobility Overal bed mobility: Needs Assistance Bed Mobility: Supine to Sit     Supine to sit: Supervision;HOB elevated     General bed mobility comments: Supervision for safety, requires extra time, effortful but capable of performing without assist using rail.  Transfers Overall transfer level: Needs assistance Equipment used: Rolling walker (2 wheeled) Transfers: Sit to/from Stand Sit to Stand: Supervision         General transfer comment: Supervision for safety. Stood from [employer], from chair x4, transferred to chair post ambulation bout.   Ambulation/Gait Ambulation/Gait  assistance: Supervision Ambulation Distance (Feet): 120 Feet (+ 100' + 120') Assistive device: Rolling walker (2 wheeled) Gait Pattern/deviations: Step-through pattern;Decreased stride length;Trunk flexed Gait velocity: decreased Gait velocity interpretation: <1.8 ft/sec, indicative of risk for recurrent falls General Gait Details: Steady gait. 2 sitting rest breaks due to fatigue/SOB and 2 standing rest braks. No knee buckling. Chair follow. Sp02 ranged 85-93% on RA.   Stairs            Wheelchair Mobility    Modified Rankin (Stroke Patients Only)       Balance Overall balance assessment: Needs assistance Sitting-balance support: Feet supported;No upper extremity supported Sitting balance-Leahy Scale: Good     Standing balance support: During functional activity Standing balance-Leahy Scale: Fair                      Cognition Arousal/Alertness: Awake/alert Behavior During Therapy: WFL for tasks assessed/performed Overall Cognitive Status: Within Functional Limits for tasks assessed                      Exercises      General Comments General comments (skin integrity, edema, etc.): Resting Sp02 90% on room air. Desats to 85% with ambulation on room air but able to recover quickly with standing/sitting rest breaks to 89-90% on room air. DOES not want to have to use 02.      Pertinent Vitals/Pain Pain Assessment: No/denies pain    Home Living                      Prior Function  PT Goals (current goals can now be found in the care plan section) Progress towards PT goals: Progressing toward goals    Frequency  Min 3X/week    PT Plan Current plan remains appropriate    Co-evaluation             End of Session Equipment Utilized During Treatment: Gait belt;Oxygen Activity Tolerance: Patient tolerated treatment well Patient left: in chair;with call bell/phone within reach;with chair alarm set     Time:  0933-1006 PT Time Calculation (min) (ACUTE ONLY): 33 min  Charges:  $Gait Training: 8-22 mins $Therapeutic Exercise: 8-22 mins                    G Codes:      Saban Heinlen A Jasha Hodzic 11/12/2015, 12:05 PM Wray Kearns, [place], DPT [phone]

## 2015-11-12 NOTE — Care Management Important Message (Signed)
Important Message  Patient Details  Name: MIAH BRETON MRN: [id number] Date of Birth: 1930/12/28   Medicare Important Message Given:  Yes    Loann Quill 11/12/2015, 3:24 PM

## 2015-11-13 DIAGNOSIS — N183 Chronic kidney disease, stage 3 (moderate): Secondary | ICD-10-CM

## 2015-11-13 LAB — BASIC METABOLIC PANEL
Anion gap: 10 (ref 5–15)
BUN: 58 mg/dL — ABNORMAL HIGH (ref 6–20)
CHLORIDE: 94 mmol/L — AB (ref 101–111)
CO2: 31 mmol/L (ref 22–32)
CREATININE: 1.56 mg/dL — AB (ref 0.44–1.00)
Calcium: 9.3 mg/dL (ref 8.9–10.3)
GFR calc non Af Amer: 30 mL/min — ABNORMAL LOW (ref >60)
GFR, EST AFRICAN AMERICAN: 35 mL/min — AB (ref >60)
Glucose, Bld: 252 mg/dL — ABNORMAL HIGH (ref 65–99)
POTASSIUM: 3.8 mmol/L (ref 3.5–5.1)
Sodium: 135 mmol/L (ref 135–145)

## 2015-11-13 LAB — GLUCOSE, CAPILLARY
GLUCOSE-CAPILLARY: 355 mg/dL — AB (ref 65–99)
GLUCOSE-CAPILLARY: 286 mg/dL — AB (ref 65–99)
Glucose-Capillary: 224 mg/dL — ABNORMAL HIGH (ref 65–99)
Glucose-Capillary: 336 mg/dL — ABNORMAL HIGH (ref 65–99)

## 2015-11-13 LAB — MAGNESIUM: Magnesium: 2.1 mg/dL (ref 1.7–2.4)

## 2015-11-13 MED ORDER — INSULIN ASPART 100 UNIT/ML ~~LOC~~ SOLN
6.0000 [IU] | Freq: Three times a day (TID) | SUBCUTANEOUS | Status: DC
Start: 2015-11-13 — End: 2015-11-15
  Administered 2015-11-13 – 2015-11-15 (×5): 6 [IU] via SUBCUTANEOUS

## 2015-11-13 MED ORDER — INSULIN DETEMIR 100 UNIT/ML ~~LOC~~ SOLN
30.0000 [IU] | Freq: Two times a day (BID) | SUBCUTANEOUS | Status: DC
  Administered 2015-11-13 – 2015-11-14 (×3): 30 [IU] via SUBCUTANEOUS
  Filled 2015-11-13 (×5): qty 0.3

## 2015-11-13 MED ORDER — INSULIN ASPART 100 UNIT/ML ~~LOC~~ SOLN
0.0000 [IU] | Freq: Three times a day (TID) | SUBCUTANEOUS | Status: DC
  Administered 2015-11-13: 7 [IU] via SUBCUTANEOUS
  Administered 2015-11-14: 5 [IU] via SUBCUTANEOUS
  Administered 2015-11-14: 2 [IU] via SUBCUTANEOUS
  Administered 2015-11-14: 5 [IU] via SUBCUTANEOUS
  Administered 2015-11-15: 2 [IU] via SUBCUTANEOUS
  Administered 2015-11-15: 7 [IU] via SUBCUTANEOUS
  Administered 2015-11-15: 5 [IU] via SUBCUTANEOUS
  Administered 2015-11-16: 2 [IU] via SUBCUTANEOUS

## 2015-11-13 MED ORDER — INSULIN ASPART 100 UNIT/ML ~~LOC~~ SOLN
0.0000 [IU] | Freq: Every day | SUBCUTANEOUS | Status: DC
  Administered 2015-11-13: 3 [IU] via SUBCUTANEOUS
  Administered 2015-11-14: 2 [IU] via SUBCUTANEOUS
  Administered 2015-11-15: 4 [IU] via SUBCUTANEOUS

## 2015-11-13 NOTE — Progress Notes (Addendum)
Inpatient Diabetes Program Recommendations  AACE/ADA: New Consensus Statement on Inpatient Glycemic Control (2015)  Target Ranges:  Prepandial:   less than 140 mg/dL      Peak postprandial:   less than 180 mg/dL (1-2 hours)      Critically ill patients:  140 - 180 mg/dL   Results for Brandi Melton, Brandi Melton (MRN [id number]) as of 11/13/2015 13:26  Ref. Range 11/12/2015 06:54 11/12/2015 07:43 11/12/2015 12:24 11/12/2015 16:03 11/12/2015 21:45 11/13/2015 05:39 11/13/2015 12:05  Glucose-Capillary Latest Ref Range: 65 - 99 mg/dL 241 (H) 219 (H) 294 (H) 300 (H) 323 (H) 224 (H) 355 (H)   Review of Glycemic Control  Diabetes history: DM2 Outpatient Diabetes medications: Levemir 80 units BID, Novolog 30 units QID Current orders for Inpatient glycemic control: Levemir 40 units daily, Novolog 0-9 units TID with meals  Inpatient Diabetes Program Recommendations: Insulin - Basal: Please consider increasing Levemir to 30 units BID (starting tonight at bedtime). Correction (SSI): Please ordering Novolog bedtime correction scale. Insulin - Meal Coverage: Please consider ordering Novolog 6 units TID with meals for meal coverage if patient is eating at least 50% of meals.  Addendum 11/13/15@14 :14-Talked with patient over the phone and she is followed by Dr. Osborne Casco for diabetes management and she reports that she takes: Levemir 80 units BID (in the morning and at bedtime) and Novolog 30 units QID (with meals and at bedtime). Patient reports that her glucose has been better over the past 3-4 weeks because she has tried to do better with following Carb Modified diet and she is remembering to take her Novolog 30 units with meals. Patient reports that she never skips taking Levemir and when she takes Levemir and Novolog 30 units at bedtime she has a snack.  Patient reports that over the past 3-4 weeks her glucose has been in the mid 100's mg/dl in the mornings but her bedtime glucose is up over 250 mg/dl usually. Discussed current  insulin regimen with patient and patient states that she feels she needs to be given Levemir twice a day to help improve glycemic control.  Encouraged patient to continue to work with her doctor to improve glycemic control as an outpatient. Patient verbalized understanding of information discussed and she reports that she has no further questions at this time.  Thanks, Barnie Alderman, RN, MSN, CDE Diabetes Coordinator Inpatient Diabetes Program [phone] (Team Pager from [place] to [place]) [phone] ([facility] office) [phone] [facility] office) [phone] [facility] office)

## 2015-11-13 NOTE — Progress Notes (Signed)
Patient Name: Brandi Melton Date of Encounter: 11/13/2015  Hospital Problem List     Principal Problem:   Acute on chronic diastolic (congestive) heart failure (HCC) Active Problems:   Diabetes ([place])   Hypertension   Sleep apnea   CKD (chronic kidney disease), stage III   Morbid obesity ([place])   Acute respiratory failure with hypoxia (HCC)    Subjective   Feeling well this morning. States she wants to go back to [employer]. Reports working well with PT yesterday  Inpatient Medications    . aspirin EC  81 mg Oral Daily  . calcium carbonate  750 mg Oral Q24H  . carvedilol  80 mg Oral Daily  . chlorhexidine  5 mL Mouth/Throat BID  . cholecalciferol  3,000 Units Oral Daily  . doxycycline  100 mg Oral Q12H  . ezetimibe-simvastatin  1 tablet Oral Daily  . folic acid  [id number] mcg Oral Daily  . furosemide  80 mg Intravenous BID  . heparin  5,000 Units Subcutaneous Q8H  . insulin aspart  0-9 Units Subcutaneous TID WC  . insulin detemir  40 Units Subcutaneous Daily  . irbesartan  75 mg Oral Daily  . magnesium oxide  400 mg Oral Daily  . potassium chloride SA  20 mEq Oral Daily  . sodium chloride flush  3 mL Intravenous Q12H  . [START ON 11/14/2015] Vitamin D (Ergocalciferol)  50,000 Units Oral Q Wed    Vital Signs    Vitals:   11/12/15 1225 11/12/15 1600 11/12/15 2049 11/13/15 0506  BP: (!) 126/38  (!) 140/40 (!) 130/43  Pulse: 60  62 65  Resp: 18  18 18   Temp:   97.5 F (36.4 C) 97.6 F (36.4 C)  TempSrc:   Oral Oral  SpO2: 96%  95% 94%  Weight:    241 lb 3.2 oz (109.4 kg)  Height:  5\' 3"  (1.6 m)      Intake/Output Summary (Last 24 hours) at 11/13/15 0758 Last data filed at 11/13/15 0600  Gross per 24 hour  Intake             1015 ml  Output             2601 ml  Net            -1586 ml   Filed Weights   11/11/15 0508 11/12/15 0315 11/13/15 0506  Weight: 243 lb 11.2 oz (110.5 kg) 242 lb 12.8 oz (110.1 kg) 241 lb 3.2 oz (109.4 kg)    Physical Exam    General:  Pleasant obese female, NAD. Neuro: Alert and oriented X 3. Moves all extremities spontaneously. Psych: Normal affect. HEENT:  Normal  Neck: Supple without bruits or JVD. Lungs:  Resp regular and unlabored, Diminished in lower lobes. Heart: RRR no s3, s4, or murmurs. Abdomen: Soft, non-tender, non-distended, BS + x 4.  Extremities: No clubbing, cyanosis, no edema. DP/PT/Radials 2+ and equal bilaterally.  Labs    CBC No results for input(s): WBC, NEUTROABS, HGB, HCT, MCV, PLT in the last 72 hours. Basic Metabolic Panel  Recent Labs  11/11/15 0400 11/12/15 0347 11/13/15 0425  NA 137 136  --   K 3.8 4.2  --   CL 96* 95*  --   CO2 32 32  --   GLUCOSE 252* 269*  --   BUN 51* 58*  --   CREATININE 1.84* 1.77*  --   CALCIUM 9.4 9.4  --   MG 2.1 2.1 2.1  Liver Function Tests No results for input(s): AST, ALT, ALKPHOS, BILITOT, PROT, ALBUMIN in the last 72 hours. No results for input(s): LIPASE, AMYLASE in the last 72 hours. Cardiac Enzymes No results for input(s): CKTOTAL, CKMB, CKMBINDEX, TROPONINI in the last 72 hours. BNP Invalid input(s): POCBNP D-Dimer No results for input(s): DDIMER in the last 72 hours. Hemoglobin A1C No results for input(s): HGBA1C in the last 72 hours. Fasting Lipid Panel No results for input(s): CHOL, HDL, LDLCALC, TRIG, CHOLHDL, LDLDIRECT in the last 72 hours. Thyroid Function Tests No results for input(s): TSH, T4TOTAL, T3FREE, THYROIDAB in the last 72 hours.  Invalid input(s): FREET3  Telemetry    SR  ECG    No recent  Radiology    Dg Chest 2 View  Result Date: 11/08/2015 CLINICAL DATA:  Shortness of breath, cough, hypoxia, acute on chronic CHF, morbid obesity EXAM: CHEST  2 VIEW COMPARISON:  None in PACs FINDINGS: The lungs are borderline hypoinflated. There is lingular atelectasis or infiltrate. There is no pleural effusion. The heart is top-normal in size. The pulmonary vascularity is not engorged. There is calcification in the wall  of the aortic arch. There is multilevel degenerative disc disease of the thoracic spine. IMPRESSION: Lingular atelectasis or interstitial pneumonia. Cardiomegaly without pulmonary edema or pleural effusion. Aortic atherosclerosis. Electronically Signed   By: Brandi  Melton M.D.   On: 11/08/2015 07:42   Assessment & Plan     Brandi Melton is a 82 year old female with a past medical history of chronic diastolic CHF, HTN, HLD, OSA, CAD, RA and CKD stage III. She presented to the office on 11/07/15 with DOE and bilateral lower extremity edema. Her o2 saturations were in the 70's with ambulation, weight was up to 255lbs when previous "dry" weight was 234lbs. She was admitted for IV diuresis.  1. Acute on chronic diastolic CHF: She has diuresed well this admission on IV lasix. Weight is down 253>>241 (dry weight 234lbs). UOP of 3L yesterday. Cr actually improved yesterday at 1.77.  BMET pending this morning. States she has ambulated in the hallway a fairly good distance but still with mild DOE. Continues to progress with PT goals. Possibly transition to PO today.  -- I have stressed the importance of daily weights at home, along with low sodium diet. She states she plans to be diligent in weighing herself, and is going to cut out the soup at [place] which she thinks may have contributed to this acute episode.  2. Acute on chronic renal insuff: Morning BMET pending. Continue to follow.  3. HTN: Blood pressure is stable. Continue current medications.  4. Right toenail infection: Started on doxycycline 7/27 with plans to continue for 7 days. Much improved.  Signed, Brandi Bellis NP-C Pager [phone]

## 2015-11-14 LAB — MAGNESIUM: Magnesium: 2.2 mg/dL (ref 1.7–2.4)

## 2015-11-14 LAB — GLUCOSE, CAPILLARY
GLUCOSE-CAPILLARY: 171 mg/dL — AB (ref 65–99)
GLUCOSE-CAPILLARY: 253 mg/dL — AB (ref 65–99)
GLUCOSE-CAPILLARY: 222 mg/dL — AB (ref 65–99)
Glucose-Capillary: 266 mg/dL — ABNORMAL HIGH (ref 65–99)

## 2015-11-14 LAB — BASIC METABOLIC PANEL
Anion gap: 10 (ref 5–15)
BUN: 53 mg/dL — ABNORMAL HIGH (ref 6–20)
CALCIUM: 9.6 mg/dL (ref 8.9–10.3)
CO2: 31 mmol/L (ref 22–32)
CREATININE: 1.52 mg/dL — AB (ref 0.44–1.00)
Chloride: 98 mmol/L — ABNORMAL LOW (ref 101–111)
GFR calc non Af Amer: 31 mL/min — ABNORMAL LOW (ref >60)
GFR, EST AFRICAN AMERICAN: 36 mL/min — AB (ref >60)
Glucose, Bld: 165 mg/dL — ABNORMAL HIGH (ref 65–99)
Potassium: 3.9 mmol/L (ref 3.5–5.1)
SODIUM: 139 mmol/L (ref 135–145)

## 2015-11-14 NOTE — Progress Notes (Signed)
Patient Name: Brandi Melton Date of Encounter: 11/14/2015  Hospital Problem List     Principal Problem:   Acute on chronic diastolic (congestive) heart failure (HCC) Active Problems:   Diabetes ([place])   Hypertension   Sleep apnea   CKD (chronic kidney disease), stage III   Morbid obesity ([place])   Acute respiratory failure with hypoxia (HCC)    Subjective   Feels well this morning. Breathing continues to improve.  Inpatient Medications    . aspirin EC  81 mg Oral Daily  . calcium carbonate  750 mg Oral Q24H  . carvedilol  80 mg Oral Daily  . chlorhexidine  5 mL Mouth/Throat BID  . cholecalciferol  3,000 Units Oral Daily  . doxycycline  100 mg Oral Q12H  . ezetimibe-simvastatin  1 tablet Oral Daily  . folic acid  [id number] mcg Oral Daily  . furosemide  80 mg Intravenous BID  . heparin  5,000 Units Subcutaneous Q8H  . insulin aspart  0-5 Units Subcutaneous QHS  . insulin aspart  0-9 Units Subcutaneous TID WC  . insulin aspart  6 Units Subcutaneous TID WC  . insulin detemir  30 Units Subcutaneous BID  . irbesartan  75 mg Oral Daily  . magnesium oxide  400 mg Oral Daily  . potassium chloride SA  20 mEq Oral Daily  . sodium chloride flush  3 mL Intravenous Q12H  . Vitamin D (Ergocalciferol)  50,000 Units Oral Q Wed    Vital Signs    Vitals:   11/13/15 0506 11/13/15 1640 11/13/15 2000 11/14/15 0542  BP: (!) 130/43 (!) 129/42 (!) 101/35 (!) 123/47  Pulse: 65 66 65 66  Resp: 18 18  18   Temp: 97.6 F (36.4 C) 97.9 F (36.6 C) 98.1 F (36.7 C) 97.6 F (36.4 C)  TempSrc: Oral Oral Oral Oral  SpO2: 94% 94% 95% 98%  Weight: 241 lb 3.2 oz (109.4 kg)   239 lb 3.2 oz (108.5 kg)  Height:        Intake/Output Summary (Last 24 hours) at 11/14/15 0853 Last data filed at 11/14/15 0851  Gross per 24 hour  Intake             1615 ml  Output             2326 ml  Net             -711 ml   Filed Weights   11/12/15 0315 11/13/15 0506 11/14/15 0542  Weight: 242 lb 12.8 oz (110.1 kg)  241 lb 3.2 oz (109.4 kg) 239 lb 3.2 oz (108.5 kg)    Physical Exam    General: Pleasant obese female, NAD. Neuro: Alert and oriented X 3. Moves all extremities spontaneously. Psych: Normal affect. HEENT:  Normal  Neck: Supple without bruits or JVD. Lungs:  Resp regular and unlabored, Diminished in lower lobes. Heart: RRR no s3, s4, or murmurs. Abdomen: Soft, non-tender, non-distended, BS + x 4.  Extremities: No clubbing, cyanosis, no edema. DP/PT/Radials 2+ and equal bilaterally.  Labs    CBC No results for input(s): WBC, NEUTROABS, HGB, HCT, MCV, PLT in the last 72 hours. Basic Metabolic Panel  Recent Labs  11/13/15 0425 11/14/15 0541  NA 135 139  K 3.8 3.9  CL 94* 98*  CO2 31 31  GLUCOSE 252* 165*  BUN 58* 53*  CREATININE 1.56* 1.52*  CALCIUM 9.3 9.6  MG 2.1 2.2   Liver Function Tests No results for input(s): AST, ALT,  ALKPHOS, BILITOT, PROT, ALBUMIN in the last 72 hours. No results for input(s): LIPASE, AMYLASE in the last 72 hours. Cardiac Enzymes No results for input(s): CKTOTAL, CKMB, CKMBINDEX, TROPONINI in the last 72 hours. BNP Invalid input(s): POCBNP D-Dimer No results for input(s): DDIMER in the last 72 hours. Hemoglobin A1C No results for input(s): HGBA1C in the last 72 hours. Fasting Lipid Panel No results for input(s): CHOL, HDL, LDLCALC, TRIG, CHOLHDL, LDLDIRECT in the last 72 hours. Thyroid Function Tests No results for input(s): TSH, T4TOTAL, T3FREE, THYROIDAB in the last 72 hours.  Invalid input(s): FREET3  Telemetry    SR  ECG    No recent  Radiology      Assessment & Plan     [name] is a 82 year old female with a past medical history of chronic diastolic CHF, HTN, HLD, OSA, CAD, RA and CKD stage III. She presented to the office on 11/07/15 with DOE and bilateral lower extremity edema. Her o2 saturations were in the 70's with ambulation, weight was up to 255lbs when previous "dry" weight was 234lbs. She was admitted for IV  diuresis.  1. Acute on chronic diastolic CHF: She has diuresed well this admission on IV lasix. Weight is down 253>>239 (dry weight 234lbs). She is down 2lbs from yesterday. UOP of 10L total. -- Cr improved this morning. Will continue with IV lasix for now, possible transition to PO tomorrow. -- I have stressed the importance of daily weights at home, along with low sodium diet. She states she plans to be diligent in weighing herself, and is going to cut out the soup at [employer] which she thinks may have contributed to this acute episode.  2. Acute on chronic renal insuff: Cr with slight improvement. Follow BMET  3. HTN: Blood pressure is stable. Continue current medications.  4. Right toenail infection: Started on doxycycline 7/27 with plans to continue for 7 days. Much improved.   5. DM II: Adjusted her Levemir yesterday, along with SSI. BS is better this morning.  Signed, Reino Bellis NP-C Pager [phone]

## 2015-11-14 NOTE — Progress Notes (Signed)
Physical Therapy Treatment Patient Details Name: Brandi Melton MRN: [id number] DOB: [birth year] Today's Date: 11/14/2015    History of Present Illness Brandi Melton is a 82 year old female with a past medical history of chronic diastolic CHF, HTN, HLD, OSA, CAD, RA and CKD stage III. She presented to the office on 11/07/15 with DOE and bilateral lower extremity edema. Her o2 saturations were in the 70's with ambulation. She was admitted for IV diuresis.    PT Comments    Brandi Melton made good progress today and was able to report 2 energy conservation techniques at the end of the session without cues.  SpO2 remained at or above 90% on RA. Pt will benefit from continued skilled PT services to increase functional independence and safety.   Follow Up Recommendations  Home health PT     Equipment Recommendations  None recommended by PT    Recommendations for Other Services OT consult     Precautions / Restrictions Precautions Precautions: Fall Precaution Comments: monitor O2 Restrictions Weight Bearing Restrictions: No    Mobility  Bed Mobility Overal bed mobility: Needs Assistance Bed Mobility: Supine to Sit     Supine to sit: Supervision;HOB elevated     General bed mobility comments: Supervision for safety, requires extra time, effortful and pt using rail.  Transfers Overall transfer level: Needs assistance Equipment used: Rolling walker (2 wheeled) Transfers: Sit to/from Stand Sit to Stand: Supervision         General transfer comment: Supervision for safety. Stood from [employer], from chair x1.  Ambulation/Gait Ambulation/Gait assistance: Supervision Ambulation Distance (Feet): 350 Feet Assistive device: Rolling walker (2 wheeled) Gait Pattern/deviations: Step-through pattern;Decreased stride length;Trunk flexed Gait velocity: decreased   General Gait Details: Steady gait, 1 seated and two standing rest breaks due to fatigue.  SpO2 at or above 90% on RA.  Cues to relax  shoulders and for upright posture and pursed lip breathing.   Stairs            Wheelchair Mobility    Modified Rankin (Stroke Patients Only)       Balance Overall balance assessment: Needs assistance Sitting-balance support: No upper extremity supported;Feet supported Sitting balance-Leahy Scale: Good     Standing balance support: During functional activity Standing balance-Leahy Scale: Fair                      Cognition Arousal/Alertness: Awake/alert Behavior During Therapy: WFL for tasks assessed/performed Overall Cognitive Status: Within Functional Limits for tasks assessed                      Exercises General Exercises - Upper Extremity Shoulder Flexion: AROM;Both;10 reps;Seated General Exercises - Lower Extremity Ankle Circles/Pumps: AROM;Both;10 reps;Seated Long Arc Quad: AROM;Both;10 reps;Seated    General Comments General comments (skin integrity, edema, etc.): Emphasized pursed lip breathing and taking standing rest breaks as two strategies for energy conservation, pt able to report these two strategies back to this PT at end of session without cues.      Pertinent Vitals/Pain Pain Assessment: Faces Faces Pain Scale: Hurts little more Pain Location: Rt knee with ambulation (h/o arthritis per pt) Pain Descriptors / Indicators: Aching Pain Intervention(s): Limited activity within patient's tolerance;Monitored during session;Repositioned    Home Living                      Prior Function  PT Goals (current goals can now be found in the care plan section) Acute Rehab PT Goals Patient Stated Goal: to get stronger PT Goal Formulation: With patient Time For Goal Achievement: 11/24/15 Potential to Achieve Goals: Good Progress towards PT goals: Progressing toward goals    Frequency  Min 3X/week    PT Plan Current plan remains appropriate    Co-evaluation             End of Session Equipment Utilized  During Treatment: Gait belt Activity Tolerance: Patient tolerated treatment well;Patient limited by fatigue Patient left: in chair;with call bell/phone within reach;with chair alarm set     Time: 1450-1513 PT Time Calculation (min) (ACUTE ONLY): 23 min  Charges:  $Gait Training: 23-37 mins                    G Codes:       Collie Siad PT, DPT  Pager: [phone] Phone: [phone] 11/14/2015, 3:36 PM

## 2015-11-14 NOTE — Progress Notes (Addendum)
Inpatient Diabetes Program Recommendations  AACE/ADA: New Consensus Statement on Inpatient Glycemic Control (2015)  Target Ranges:  Prepandial:   less than 140 mg/dL      Peak postprandial:   less than 180 mg/dL (1-2 hours)      Critically ill patients:  140 - 180 mg/dL   Results for Brandi Melton, Brandi Melton (MRN [id number]) as of 11/14/2015 12:27  Ref. Range 11/13/2015 05:39 11/13/2015 12:05 11/13/2015 16:41 11/13/2015 21:35 11/14/2015 06:26 11/14/2015 11:39  Glucose-Capillary Latest Ref Range: 65 - 99 mg/dL 224 (H) 355 (H) 336 (H) 286 (H) 171 (H) 253 (H)   Review of Glycemic Control  Diabetes history: DM2 Outpatient Diabetes medications: Levemir 80 units BID, Novolog 30 units QID Current orders for Inpatient glycemic control: Levemir 30 units BID, Novolog 0-9 units TID with meals, Novolog 0-5 units QHS, Novolog 6 units TID with meals for meal coverage  Inpatient Diabetes Program Recommendations: Insulin - Basal: Please consider increasing Levemir to 35 units BID. Insulin - Meal Coverage: Please consider increasing meal coverage to Novolog 12 units TID with meals if patient is eating at least 50% of meals.  Thanks, Barnie Alderman, RN, MSN, CDE Diabetes Coordinator Inpatient Diabetes Program [phone] (Team Pager from [place] to [place]) [phone] ([facility] office) [phone] [facility] office) [phone] [facility] office)

## 2015-11-15 LAB — BASIC METABOLIC PANEL
Anion gap: 9 (ref 5–15)
BUN: 57 mg/dL — ABNORMAL HIGH (ref 6–20)
CHLORIDE: 97 mmol/L — AB (ref 101–111)
CO2: 31 mmol/L (ref 22–32)
Calcium: 9.7 mg/dL (ref 8.9–10.3)
Creatinine, Ser: 1.64 mg/dL — ABNORMAL HIGH (ref 0.44–1.00)
GFR calc non Af Amer: 28 mL/min — ABNORMAL LOW (ref >60)
GFR, EST AFRICAN AMERICAN: 33 mL/min — AB (ref >60)
Glucose, Bld: 252 mg/dL — ABNORMAL HIGH (ref 65–99)
POTASSIUM: 4.4 mmol/L (ref 3.5–5.1)
Sodium: 137 mmol/L (ref 135–145)

## 2015-11-15 LAB — GLUCOSE, CAPILLARY
GLUCOSE-CAPILLARY: 153 mg/dL — AB (ref 65–99)
GLUCOSE-CAPILLARY: 261 mg/dL — AB (ref 65–99)
GLUCOSE-CAPILLARY: 319 mg/dL — AB (ref 65–99)
Glucose-Capillary: 325 mg/dL — ABNORMAL HIGH (ref 65–99)

## 2015-11-15 LAB — MAGNESIUM: MAGNESIUM: 2.2 mg/dL (ref 1.7–2.4)

## 2015-11-15 LAB — URIC ACID: URIC ACID, SERUM: 12.4 mg/dL — AB (ref 2.3–6.6)

## 2015-11-15 MED ORDER — COLCHICINE 0.6 MG PO TABS: 0.6000 mg | ORAL_TABLET | Freq: Two times a day (BID) | ORAL | Status: DC

## 2015-11-15 MED ORDER — COLCHICINE 0.6 MG PO TABS
0.6000 mg | ORAL_TABLET | Freq: Once | ORAL | Status: AC
  Administered 2015-11-15: 0.6 mg via ORAL
  Filled 2015-11-15: qty 1

## 2015-11-15 MED ORDER — DOXYCYCLINE HYCLATE 100 MG PO TABS: 100.0000 mg | ORAL_TABLET | Freq: Two times a day (BID) | ORAL | Status: DC

## 2015-11-15 MED ORDER — INSULIN ASPART 100 UNIT/ML ~~LOC~~ SOLN
12.0000 [IU] | Freq: Three times a day (TID) | SUBCUTANEOUS | Status: DC
  Administered 2015-11-15 (×2): 12 [IU] via SUBCUTANEOUS

## 2015-11-15 MED ORDER — INSULIN DETEMIR 100 UNIT/ML ~~LOC~~ SOLN
35.0000 [IU] | Freq: Two times a day (BID) | SUBCUTANEOUS | Status: DC
  Administered 2015-11-15 – 2015-11-16 (×3): 35 [IU] via SUBCUTANEOUS
  Filled 2015-11-15 (×4): qty 0.35

## 2015-11-15 NOTE — Progress Notes (Signed)
Patient Name: Brandi Melton Date of Encounter: 11/15/2015  Hospital Problem List     Principal Problem:   Acute on chronic diastolic (congestive) heart failure (HCC) Active Problems:   Diabetes ([place])   Hypertension   Sleep apnea   CKD (chronic kidney disease), stage III   Morbid obesity ([place])   Acute respiratory failure with hypoxia (HCC)    Subjective   Feels well this morning. Breathing continues to improve. Worked with PT yesterday and did very well.   Inpatient Medications    . aspirin EC  81 mg Oral Daily  . calcium carbonate  750 mg Oral Q24H  . carvedilol  80 mg Oral Daily  . chlorhexidine  5 mL Mouth/Throat BID  . cholecalciferol  3,000 Units Oral Daily  . doxycycline  100 mg Oral Q12H  . [START ON 11/16/2015] doxycycline  100 mg Oral Q12H  . ezetimibe-simvastatin  1 tablet Oral Daily  . folic acid  [id number] mcg Oral Daily  . furosemide  80 mg Intravenous BID  . heparin  5,000 Units Subcutaneous Q8H  . insulin aspart  0-5 Units Subcutaneous QHS  . insulin aspart  0-9 Units Subcutaneous TID WC  . insulin aspart  6 Units Subcutaneous TID WC  . insulin detemir  30 Units Subcutaneous BID  . irbesartan  75 mg Oral Daily  . magnesium oxide  400 mg Oral Daily  . potassium chloride SA  20 mEq Oral Daily  . sodium chloride flush  3 mL Intravenous Q12H  . Vitamin D (Ergocalciferol)  50,000 Units Oral Q Wed    Vital Signs    Vitals:   11/14/15 0542 11/14/15 1325 11/14/15 2211 11/15/15 0525  BP: (!) 123/47 94/67 (!) 133/42 (!) 131/43  Pulse: 66 63 63 68  Resp: 18 18  18   Temp: 97.6 F (36.4 C) 97.7 F (36.5 C) 98.1 F (36.7 C) 97.6 F (36.4 C)  TempSrc: Oral Oral Oral Oral  SpO2: 98% 95% 96% 98%  Weight: 239 lb 3.2 oz (108.5 kg)   238 lb 12.8 oz (108.3 kg)  Height:        Intake/Output Summary (Last 24 hours) at 11/15/15 0846 Last data filed at 11/15/15 0810  Gross per 24 hour  Intake             1550 ml  Output             1400 ml  Net              150 ml    Filed Weights   11/13/15 0506 11/14/15 0542 11/15/15 0525  Weight: 241 lb 3.2 oz (109.4 kg) 239 lb 3.2 oz (108.5 kg) 238 lb 12.8 oz (108.3 kg)    Physical Exam    General: Pleasant obese female, NAD. Neuro: Alert and oriented X 3. Moves all extremities spontaneously. Psych: Normal affect. HEENT:  Normal  Neck: Supple without bruits or JVD. Lungs:  Resp regular and unlabored, Diminished in lower lobes. Heart: RRR no s3, s4, or murmurs. Abdomen: Soft, non-tender, non-distended, BS + x 4.  Extremities: No clubbing, cyanosis, no edema. DP/PT/Radials 2+ and equal bilaterally. Redness to the medial aspect of the first MTP joint.  Labs    CBC No results for input(s): WBC, NEUTROABS, HGB, HCT, MCV, PLT in the last 72 hours. Basic Metabolic Panel  Recent Labs  11/13/15 0425 11/14/15 0541 11/15/15 0257  NA 135 139  --   K 3.8 3.9  --  CL 94* 98*  --   CO2 31 31  --   GLUCOSE 252* 165*  --   BUN 58* 53*  --   CREATININE 1.56* 1.52*  --   CALCIUM 9.3 9.6  --   MG 2.1 2.2 2.2   Liver Function Tests No results for input(s): AST, ALT, ALKPHOS, BILITOT, PROT, ALBUMIN in the last 72 hours. No results for input(s): LIPASE, AMYLASE in the last 72 hours. Cardiac Enzymes No results for input(s): CKTOTAL, CKMB, CKMBINDEX, TROPONINI in the last 72 hours. BNP Invalid input(s): POCBNP D-Dimer No results for input(s): DDIMER in the last 72 hours. Hemoglobin A1C No results for input(s): HGBA1C in the last 72 hours. Fasting Lipid Panel No results for input(s): CHOL, HDL, LDLCALC, TRIG, CHOLHDL, LDLDIRECT in the last 72 hours. Thyroid Function Tests No results for input(s): TSH, T4TOTAL, T3FREE, THYROIDAB in the last 72 hours.  Invalid input(s): FREET3  Telemetry    SR  ECG    No recent  Radiology      Assessment & Plan     Ms. Brandi Melton is a 82 year old female with a past medical history of chronic diastolic CHF, HTN, HLD, OSA, CAD, RA and CKD stage III. She presented to the  office on 11/07/15 with DOE and bilateral lower extremity edema. Her o2 saturations were in the 70's with ambulation, weight was up to 255lbs when previous "dry" weight was 234lbs. She was admitted for IV diuresis.  1. Acute on chronic diastolic CHF: She has diuresed well this admission on IV lasix. Weight is down 253>>238 (dry weight 234lbs). She is down 1 more lb from yesterday. UOP of 10L total, 1.4L yesterday. -- Cr pending this am. Will continue with IV lasix for now, possible transition to PO today. -- I have stressed the importance of daily weights at home, along with low sodium diet. She states she plans to be diligent in weighing herself, and is going to cut out the soup at [facility] which she thinks may have contributed to this acute episode.  2. Acute on chronic renal insuff: Cr has been stable, morning labs pending. Follow BMET  3. HTN: Blood pressure is stable. Continue current medications.  4. Right toenail infection: Started on doxycycline 7/27 with plans to continue for 7 days. Reports increased pain to the joint right below the great toe, some redness noted to the medial aspect of the MTP joint. Area is TTP. Will continue with antibiotics. No fevers noted. Have encouraged her to see a podiatrist in the outpatient setting given her hx of DM.  5. DM II: Adjusted her Levemir 8/1, along with SSI. BS is remaining elevated. Diabetes coordinator following with recommendations.   Signed, Reino Bellis NP-C Pager [phone]

## 2015-11-15 NOTE — Care Management Important Message (Signed)
Important Message  Patient Details  Name: Brandi Melton MRN: [id number] Date of Birth: [birth year]   Medicare Important Message Given:  Yes    Loann Quill 11/15/2015, 10:52 AM

## 2015-11-15 NOTE — Progress Notes (Signed)
Pt. States she does not wear cpap and that she stopped wearing it years ago.

## 2015-11-16 ENCOUNTER — Encounter (HOSPITAL_COMMUNITY): Payer: Self-pay | Admitting: Student

## 2015-11-16 LAB — GLUCOSE, CAPILLARY
GLUCOSE-CAPILLARY: 169 mg/dL — AB (ref 65–99)
GLUCOSE-CAPILLARY: 263 mg/dL — AB (ref 65–99)

## 2015-11-16 LAB — MAGNESIUM: MAGNESIUM: 2.5 mg/dL — AB (ref 1.7–2.4)

## 2015-11-16 MED ORDER — ALLOPURINOL 100 MG PO TABS: 100.0000 mg | ORAL_TABLET | Freq: Every day | ORAL | Status: DC

## 2015-11-16 MED ORDER — COLCHICINE 0.6 MG PO CAPS: 0.3000 mg | ORAL_CAPSULE | Freq: Once | ORAL | 0 refills | Status: DC

## 2015-11-16 MED ORDER — COLCHICINE 0.6 MG PO TABS
0.3000 mg | ORAL_TABLET | Freq: Every day | ORAL | Status: DC
  Filled 2015-11-16: qty 1

## 2015-11-16 MED ORDER — FUROSEMIDE 80 MG PO TABS: 80.0000 mg | ORAL_TABLET | Freq: Two times a day (BID) | ORAL | Status: DC

## 2015-11-16 MED ORDER — FUROSEMIDE 80 MG PO TABS: 80.0000 mg | ORAL_TABLET | Freq: Two times a day (BID) | ORAL | 0 refills | Status: DC

## 2015-11-16 MED ORDER — COLCHICINE 0.6 MG PO TABS: 0.3000 mg | ORAL_TABLET | Freq: Every day | ORAL | Status: DC

## 2015-11-16 MED ORDER — FOLIC ACID 1 MG PO TABS
1.0000 mg | ORAL_TABLET | Freq: Every day | ORAL | Status: DC
  Administered 2015-11-16: 1 mg via ORAL
  Filled 2015-11-16: qty 1

## 2015-11-16 MED ORDER — COLCHICINE 0.6 MG PO TABS
0.3000 mg | ORAL_TABLET | Freq: Every day | ORAL | Status: DC
  Administered 2015-11-16: 0.3 mg via ORAL

## 2015-11-16 MED ORDER — INSULIN ASPART 100 UNIT/ML ~~LOC~~ SOLN
12.0000 [IU] | Freq: Three times a day (TID) | SUBCUTANEOUS | Status: DC
  Administered 2015-11-16: 12 [IU] via SUBCUTANEOUS

## 2015-11-16 NOTE — Discharge Summary (Signed)
Discharge Summary    Patient ID: Brandi Melton,  MRN: [id number], DOB/AGE: [birth year] 80 y.o.  Admit date: 11/07/2015 Discharge date: 11/16/2015  Primary Care Provider: No PCP Per Patient Primary Cardiologist: Dr. Harrington Challenger  Discharge Diagnoses    Principal Problem:   Acute on chronic diastolic (congestive) heart failure ([place]) Active Problems:   Diabetes ([place])   Hypertension   Sleep apnea   CKD (chronic kidney disease), stage III   Morbid obesity ([place])   Acute respiratory failure with hypoxia ([place])   History of Present Illness     Brandi Melton is a 82 year old female with past medical history of chronic diastolic CHF (EF [id number] by echo in 06/2015), HTN, HLD, OSA, CAD, RA and CKD stage III who presented to the office on 11/07/15 with DOE and bilateral lower extremity edema.   Weight at time of discharge in 06/2015 was 234 lbs. At the time of her office visit, her weight was up to 255 lbs. Oxygen saturations were 92% at rest but dropped to 70% on ambulation. She reported not having good control of her diet at [place] and was consuming soup somewhat frequently.   With her significant volume overload and hypoxia, she was started on IV Lasix 80mg  BID.    Hospital Course     Consultants: None   She diuresed well with IV Lasix, with an overall net output of -10.5L. Weight went down from 253 lbs on admission to 238 lbs at time of discharge. Kidney function was followed closely with her creatinine being 1.53 on admission, peaked at 1.84, and improved to 1.64 at time of discharge. On 11/16/2015, her volume status appeared to be at baseline by physical exam and her respiratory status was significantly improved. She was discharged on PO Lasix 80mg  BID (was on PO Lasix 80mg  in AM and 40mg  in PM prior to admission).   Other events during her hospitalization include being diagnosed with a right toenail infection. She was started on Doxycycline 100mg  BID for 7 days for this (course completed on  11/15/2015).   She also underwent multiple adjustments in her diabetes regimen due to SSI not providing enough coverage. She reported her glucose was well-controlled prior to admission and was discharged on her home diabetic medication regimen.  Also, on 11/15/2015 she reported having right great toe pain. Uric acid was found to be elevated to 12.4. This was discussed with Internal Medicine who recommended using Colchicine 0.3mg  for two days then stopping. They also recommended using Allopurinol but waiting for one week until her acute symptoms resolved. She will need to follow-up with her PCP for this.   She was last examined by Dr. Radford Pax and deemed stable for discharge. She has previously scheduled follow-up with Cardiology on 11/29/2015. She was discharged home in stable condition. _____________  Discharge Vitals Blood pressure (!) 134/43, pulse 63, temperature 98.4 F (36.9 C), temperature source Oral, resp. rate 18, height 5\' 3"  (1.6 m), weight 238 lb 3.2 oz (108 kg), SpO2 95 %.  Filed Weights   11/14/15 0542 11/15/15 0525 11/16/15 0408  Weight: 239 lb 3.2 oz (108.5 kg) 238 lb 12.8 oz (108.3 kg) 238 lb 3.2 oz (108 kg)    Labs & Radiologic Studies     CBC No results for input(s): WBC, NEUTROABS, HGB, HCT, MCV, PLT in the last 72 hours. Basic Metabolic Panel  Recent Labs  11/14/15 0541 11/15/15 0257 11/15/15 1257 11/16/15 0354  NA 139  --  137  --  K 3.9  --  4.4  --   CL 98*  --  97*  --   CO2 31  --  31  --   GLUCOSE 165*  --  252*  --   BUN 53*  --  57*  --   CREATININE 1.52*  --  1.64*  --   CALCIUM 9.6  --  9.7  --   MG 2.2 2.2  --  2.5*   Liver Function Tests No results for input(s): AST, ALT, ALKPHOS, BILITOT, PROT, ALBUMIN in the last 72 hours. No results for input(s): LIPASE, AMYLASE in the last 72 hours. Cardiac Enzymes No results for input(s): CKTOTAL, CKMB, CKMBINDEX, TROPONINI in the last 72 hours. BNP Invalid input(s): POCBNP D-Dimer No results for  input(s): DDIMER in the last 72 hours. Hemoglobin A1C No results for input(s): HGBA1C in the last 72 hours. Fasting Lipid Panel No results for input(s): CHOL, HDL, LDLCALC, TRIG, CHOLHDL, LDLDIRECT in the last 72 hours. Thyroid Function Tests No results for input(s): TSH, T4TOTAL, T3FREE, THYROIDAB in the last 72 hours.  Invalid input(s): FREET3  Dg Chest 2 View: Result Date: 11/08/2015 CLINICAL DATA:  Shortness of breath, cough, hypoxia, acute on chronic CHF, morbid obesity EXAM: CHEST  2 VIEW COMPARISON:  None in PACs FINDINGS: The lungs are borderline hypoinflated. There is lingular atelectasis or infiltrate. There is no pleural effusion. The heart is top-normal in size. The pulmonary vascularity is not engorged. There is calcification in the wall of the aortic arch. There is multilevel degenerative disc disease of the thoracic spine. IMPRESSION: Lingular atelectasis or interstitial pneumonia. Cardiomegaly without pulmonary edema or pleural effusion. Aortic atherosclerosis. Electronically Signed   By: David  Martinique M.D.   On: 11/08/2015 07:42    Diagnostic Studies/Procedures    None Performed.  Disposition   Pt is being discharged home today in good condition.  Follow-up Plans & Appointments    Follow-up Information    Murray Hodgkins, NP Follow up on 11/29/2015.   Specialties:  Nurse Practitioner, Cardiology, Radiology Why:  Cardiology Follow-up on 11/29/2015 at 9:30AM. Office of Dr. Harrington Challenger. Contact information: [id number] [place] [address] [phone]        Primary Care Provider Follow up in 1 week(s).   Why:  Follow-up with PCP in 1 week for acute gout.         Discharge Instructions    Diet - low sodium heart healthy    Complete by:  As directed      Discharge Medications     Medication List    TAKE these medications   aspirin EC 81 MG tablet Take 81 mg by mouth daily.   calcium carbonate 750 MG chewable tablet Commonly known  as:  TUMS EX Chew 1 tablet by mouth daily.   carvedilol 80 MG 24 hr capsule Commonly known as:  COREG CR Take 1 capsule (80 mg total) by mouth daily.   chlorhexidine 0.12 % solution Commonly known as:  PERIDEX Use as directed 5 mLs in the mouth or throat 2 (two) times daily.   cholecalciferol 1000 units tablet Commonly known as:  VITAMIN D Take 3,000 Units by mouth daily.   Colchicine 0.6 MG Caps Commonly known as:  MITIGARE Take 0.3 mg by mouth once.   ezetimibe-simvastatin 10-40 MG tablet Commonly known as:  VYTORIN Take 1 tablet by mouth daily.   folic acid A999333 MCG tablet Commonly known as:  FOLVITE Take 400 mcg by mouth daily.  furosemide 80 MG tablet Commonly known as:  LASIX Take 1 tablet (80 mg total) by mouth 2 (two) times daily. What changed:  how much to take  how to take this  when to take this  additional instructions   Insulin Detemir 100 UNIT/ML Pen Commonly known as:  LEVEMIR Inject 80 Units into the skin 2 (two) times daily.   magnesium oxide 400 MG tablet Commonly known as:  MAG-OX Take 400 mg by mouth daily.   NOVOLOG 100 UNIT/ML injection Generic drug:  insulin aspart Inject 30 Units into the skin 4 (four) times daily.   potassium chloride SA 20 MEQ tablet Commonly known as:  K-DUR,KLOR-CON Take 20 mEq by mouth daily.   telmisartan 20 MG tablet Commonly known as:  MICARDIS Take 20 mg by mouth daily.   Vitamin D (Ergocalciferol) 50000 units Caps capsule Commonly known as:  DRISDOL Take 50,000 Units by mouth every Wednesday.        Allergies No Known Allergies   Outstanding Labs/Studies   BMET at time of follow-up appointment.  Duration of Discharge Encounter   Greater than 30 minutes including physician time.  Signed, Erma Heritage, PA-C 11/16/2015, 7:33 PM

## 2015-11-16 NOTE — Progress Notes (Signed)
Hospital Problem List     Principal Problem:   Acute on chronic diastolic (congestive) heart failure (HCC) Active Problems:   Diabetes ([place])   Hypertension   Sleep apnea   CKD (chronic kidney disease), stage III   Morbid obesity (HCC)   Acute respiratory failure with hypoxia [facility])     Patient Profile:   Primary Cardiologist: Dr. Harrington Challenger  82 year old female w/ PMH of chronic diastolic CHF, HTN, HLD, OSA, CAD, RA and CKD stage III who presented to the office on 11/07/15 with DOE and bilateral lower extremity edema. Her o2 saturations were in the 70's with ambulation, weight was up to 255lbs when previous "dry" weight was 234lbs.    Subjective   Denies any chest discomfort or shortness of breath overnight. Anxious to go home.  Inpatient Medications    . aspirin EC  81 mg Oral Daily  . calcium carbonate  750 mg Oral Q24H  . carvedilol  80 mg Oral Daily  . chlorhexidine  5 mL Mouth/Throat BID  . cholecalciferol  3,000 Units Oral Daily  . ezetimibe-simvastatin  1 tablet Oral Daily  . folic acid  1 mg Oral Daily  . furosemide  80 mg Intravenous BID  . heparin  5,000 Units Subcutaneous Q8H  . insulin aspart  0-5 Units Subcutaneous QHS  . insulin aspart  0-9 Units Subcutaneous TID WC  . insulin aspart  12 Units Subcutaneous TID WC  . insulin detemir  35 Units Subcutaneous BID  . irbesartan  75 mg Oral Daily  . magnesium oxide  400 mg Oral Daily  . potassium chloride SA  20 mEq Oral Daily  . sodium chloride flush  3 mL Intravenous Q12H  . Vitamin D (Ergocalciferol)  50,000 Units Oral Q Wed    Vital Signs    Vitals:   11/15/15 1211 11/15/15 2100 11/16/15 0408 11/16/15 0447  BP: (!) 122/45 (!) 105/48  (!) 134/43  Pulse: 63 70  63  Resp: 18 18  18   Temp: 97.5 F (36.4 C) 98.1 F (36.7 C)  98.4 F (36.9 C)  TempSrc: Oral Oral  Oral  SpO2: 97% 97%  95%  Weight:   238 lb 3.2 oz (108 kg)   Height:        Intake/Output Summary (Last 24 hours) at 11/16/15 0942 Last  data filed at 11/16/15 0842  Gross per 24 hour  Intake              600 ml  Output             1025 ml  Net             -425 ml   Filed Weights   11/14/15 0542 11/15/15 0525 11/16/15 0408  Weight: 239 lb 3.2 oz (108.5 kg) 238 lb 12.8 oz (108.3 kg) 238 lb 3.2 oz (108 kg)    Physical Exam    General: Well developed, well nourished, female appearing in no acute distress. Head: Normocephalic, atraumatic.  Neck: Supple without bruits, JVD not elevated. Lungs:  Resp regular and unlabored, no wheezing or rales appreciated. Heart: RRR, S1, S2, no S3, S4, or murmur; no rub. Abdomen: Soft, non-tender, non-distended with normoactive bowel sounds. No hepatomegaly. No rebound/guarding. No obvious abdominal masses. Extremities: No clubbing, cyanosis, or edema. Distal pedal pulses are 2+ bilaterally. Neuro: Alert and oriented X 3. Moves all extremities spontaneously. Psych: Normal affect.  Labs    CBC No results for input(s): WBC, NEUTROABS, HGB,  HCT, MCV, PLT in the last 72 hours. Basic Metabolic Panel  Recent Labs  11/14/15 0541 11/15/15 0257 11/15/15 1257 11/16/15 0354  NA 139  --  137  --   K 3.9  --  4.4  --   CL 98*  --  97*  --   CO2 31  --  31  --   GLUCOSE 165*  --  252*  --   BUN 53*  --  57*  --   CREATININE 1.52*  --  1.64*  --   CALCIUM 9.6  --  9.7  --   MG 2.2 2.2  --  2.5*    ECG    No new tracings.   Cardiac Studies and Radiology    Dg Chest 2 View  Result Date: 11/08/2015 CLINICAL DATA:  Shortness of breath, cough, hypoxia, acute on chronic CHF, morbid obesity EXAM: CHEST  2 VIEW COMPARISON:  None in PACs FINDINGS: The lungs are borderline hypoinflated. There is lingular atelectasis or infiltrate. There is no pleural effusion. The heart is top-normal in size. The pulmonary vascularity is not engorged. There is calcification in the wall of the aortic arch. There is multilevel degenerative disc disease of the thoracic spine. IMPRESSION: Lingular atelectasis or  interstitial pneumonia. Cardiomegaly without pulmonary edema or pleural effusion. Aortic atherosclerosis. Electronically Signed   By: David  Martinique M.D.   On: 11/08/2015 07:42   Echocardiogram: 07/03/2015 Study Conclusions  - Left ventricle: The cavity size was normal. Systolic function was   vigorous. The estimated ejection fraction was in the range of 65%   to 70%. Wall motion was normal; there were no regional wall   motion abnormalities. Doppler parameters are consistent with   abnormal left ventricular relaxation (grade 1 diastolic   dysfunction). Doppler parameters are consistent with elevated   ventricular end-diastolic filling pressure. - Aortic valve: Trileaflet; normal thickness leaflets.   Transvalvular velocity was within the normal range. There was no   stenosis. There was no regurgitation. - Aortic root: The aortic root was normal in size. - Mitral valve: Severe mitral annular calcifications, predominantly   posterior. Calcified annulus. Mildly thickened leaflets . There   was no regurgitation. - Right ventricle: The cavity size was normal. Wall thickness was   normal. Systolic function was normal. - Tricuspid valve: There was no regurgitation. - Pulmonary arteries: Systolic pressure was within the normal   range.  Assessment & Plan    1. Acute on chronic diastolic CHF - She has diuresed well this admission on IV lasix. Weight is down 253>>238 (dry weight 234lbs). Net output of -10.5L.  - creatinine 1.64 yesterday, will plan to transition to PO Lasix today. Currently on IV Lasix 80mg  BID, was on PO Lasix 80mg  in AM and 40mg  in PM prior to admission. Will likely need Lasix 80mg  BID at this time. - patient has been educated on the the importance of daily weights at home, along with low sodium diet. She states she plans to be diligent in weighing herself, and is going to cut out the soup at [place] which she thinks may have contributed to this acute episode.  2. Acute  on chronic renal insuff:  - creatinine 1.53 on admission, peaked at 1.84. Improved to 1.64 on 11/15/2015.  BMET pending this am.  3. HTN - BP has been 105/53 - 134/48 in the past 24 hours. - Continue current medications.  4. Right toenail infection - Started on doxycycline 7/27 with plans to continue for  7 days. Reports increased pain to the joint right below the great toe, some redness noted to the medial aspect of the MTP joint. No fevers noted.  Findings c/w gout and Uric acid level elevated (see below).   Have encouraged her to see a podiatrist in the outpatient setting given her hx of DM.  5. DM II - Adjusted her Levemir 8/1, along with SSI. BS is remaining elevated. Diabetes coordinator following with recommendations.   6. Acute Gout - Uric acid elevated to 12.4. - still having R great toe pain.  - Discussed with Hospitalist and recommend 2 more days of Colchicine 0.3mg  then stop.  Also will need to be on Allopurinol and recommend not starting for 1 week.   - will need early follow-up with her PCP.  Arna Medici , PA-C 9:42 AM 11/16/2015 Pager: [phone]  Patient seen and independently examined with Bernerd Pho, PA. We discussed all aspects of the encounter. I agree with the assessment and plan as stated above with minor modifications.  Patient is doing well and near her dry weight.  She has no evidence of volume overload on exam today and eager to go home.  Will discharge home on Lasix 80mg  BID with early followup in the office.  Her right toe pain has improved since a dose of colchicine yesterday and uric acid is elevated at 12.  Will give 2 more days of Colchicine 0.3mg  (renal dosed) and then stop.  Will need early followup with PCP to get started on Allopurinol ([facility] recommended waiting a week after acute gout attack to start Allopurinol).  She is stable for discharge home today.   Signed: Fransico Him, MD [facility] 11/16/2015

## 2015-11-16 NOTE — Progress Notes (Signed)
Pt has orders to be discharged. Discharge instructions given and pt has no additional questions at this time. Medication regimen reviewed and pt educated. Pt verbalized understanding and has no additional questions. Colchicine 0.3mg  given. Telemetry box removed. IV removed and site in good condition. Pt stable and waiting for transportation.

## 2015-11-16 NOTE — Progress Notes (Signed)
Inpatient Diabetes Program Recommendations  AACE/ADA: New Consensus Statement on Inpatient Glycemic Control (2015)  Target Ranges:  Prepandial:   less than 140 mg/dL      Peak postprandial:   less than 180 mg/dL (1-2 hours)      Critically ill patients:  140 - 180 mg/dL   Results for Brandi Melton, Brandi Melton (MRN [id number]) as of 11/16/2015 08:58  Ref. Range 11/15/2015 06:20 11/15/2015 11:41 11/15/2015 16:07 11/15/2015 21:16 11/16/2015 05:58  Glucose-Capillary Latest Ref Range: 65 - 99 mg/dL 153 (H) 261 (H) 319 (H) 325 (H) 169 (H)   Review of Glycemic Control  Diabetes history:DM2 Outpatient Diabetes medications: Levemir 80 units BID, Novolog 30 units QID Current orders for Inpatient glycemic control: Levemir 35 units BID, Novolog 0-9 units TID with meals, Novolog 0-5 units QHS, Novolog 12 units TID with meals for meal coverage   Inpatient Diabetes Program Recommendations:  Insulin - Meal Coverage: Noted meal coverage was increased to 12 units TID yesterday and post prandial continues to be elevated. Please consider increasing meal coverage to Novolog 16 units TID with meals.  Thanks, Barnie Alderman, RN, MSN, CDE Diabetes Coordinator Inpatient Diabetes Program [phone] (Team Pager from [place] to [place]) [phone] ([facility] office) [phone] [facility] office) [phone] [facility] office)

## 2015-11-26 ENCOUNTER — Telehealth: Payer: Self-pay | Admitting: Internal Medicine

## 2015-11-26 NOTE — Telephone Encounter (Signed)
Called back to inform patient that I did not see a record of anyone contacting her from this office recently.  There was no answer on her phone number and I did not leave a message on this phone per the DPR.

## 2015-11-26 NOTE — Telephone Encounter (Signed)
New message ° ° ° °Pt verbalized that she is returning call  °

## 2015-11-29 ENCOUNTER — Ambulatory Visit (INDEPENDENT_AMBULATORY_CARE_PROVIDER_SITE_OTHER): Payer: Medicare Other | Admitting: Nurse Practitioner

## 2015-11-29 ENCOUNTER — Encounter: Payer: Self-pay | Admitting: Nurse Practitioner

## 2015-11-29 VITALS — BP 100/42 | HR 64 | Ht 63.0 in | Wt 248.8 lb

## 2015-11-29 DIAGNOSIS — I5032 Chronic diastolic (congestive) heart failure: Secondary | ICD-10-CM

## 2015-11-29 DIAGNOSIS — N182 Chronic kidney disease, stage 2 (mild): Secondary | ICD-10-CM | POA: Diagnosis not present

## 2015-11-29 DIAGNOSIS — N183 Chronic kidney disease, stage 3 unspecified: Secondary | ICD-10-CM

## 2015-11-29 DIAGNOSIS — I110 Hypertensive heart disease with heart failure: Secondary | ICD-10-CM | POA: Diagnosis not present

## 2015-11-29 DIAGNOSIS — Z794 Long term (current) use of insulin: Secondary | ICD-10-CM

## 2015-11-29 DIAGNOSIS — E119 Type 2 diabetes mellitus without complications: Secondary | ICD-10-CM | POA: Diagnosis not present

## 2015-11-29 DIAGNOSIS — Z9889 Other specified postprocedural states: Secondary | ICD-10-CM | POA: Insufficient documentation

## 2015-11-29 DIAGNOSIS — E785 Hyperlipidemia, unspecified: Secondary | ICD-10-CM

## 2015-11-29 DIAGNOSIS — E160 Drug-induced hypoglycemia without coma: Secondary | ICD-10-CM | POA: Insufficient documentation

## 2015-11-29 DIAGNOSIS — I2510 Atherosclerotic heart disease of native coronary artery without angina pectoris: Secondary | ICD-10-CM

## 2015-11-29 DIAGNOSIS — T383X5A Adverse effect of insulin and oral hypoglycemic [antidiabetic] drugs, initial encounter: Secondary | ICD-10-CM

## 2015-11-29 LAB — BASIC METABOLIC PANEL
BUN: 38 mg/dL — AB (ref 7–25)
CALCIUM: 9.3 mg/dL (ref 8.6–10.4)
CO2: 28 mmol/L (ref 20–31)
CREATININE: 1.66 mg/dL — AB (ref 0.60–0.88)
Chloride: 104 mmol/L (ref 98–110)
GLUCOSE: 84 mg/dL (ref 65–99)
Potassium: 4.3 mmol/L (ref 3.5–5.3)
Sodium: 143 mmol/L (ref 135–146)

## 2015-11-29 NOTE — Patient Instructions (Addendum)
Medication Instructions:  Your physician recommends that you continue on your current medications as directed. Please refer to the Current Medication list given to you today.  Labwork: Your physician recommends that you have lab work today: BMP  Testing/Procedures: No new orders.   Follow-Up: Your physician recommends that you schedule a follow-up appointment in: 1-2 WEEKS with Richardson Dopp PA-C    Any Other Special Instructions Will Be Listed Below (If Applicable).  If you need a refill on your cardiac medications before your next appointment, please call your pharmacy.   Low-Sodium Eating Plan Sodium raises blood pressure and causes water to be held in the body. Getting less sodium from food will help lower your blood pressure, reduce any swelling, and protect your heart, liver, and kidneys. We get sodium by adding salt (sodium chloride) to food. Most of our sodium comes from canned, boxed, and frozen foods. Restaurant foods, fast foods, and pizza are also very high in sodium. Even if you take medicine to lower your blood pressure or to reduce fluid in your body, getting less sodium from your food is important. WHAT IS MY PLAN? Most people should limit their sodium intake to 2,300 mg a day.   WHAT DO I NEED TO KNOW ABOUT THIS EATING PLAN? For the low-sodium eating plan, you will follow these general guidelines:  Choose foods with a % Daily Value for sodium of less than 5% (as listed on the food label).   Use salt-free seasonings or herbs instead of table salt or sea salt.   Check with your health care provider or pharmacist before using salt substitutes.   Eat fresh foods.  Eat more vegetables and fruits.  Limit canned vegetables. If you do use them, rinse them well to decrease the sodium.   Limit cheese to 1 oz (28 g) per day.   Eat lower-sodium products, often labeled as "lower sodium" or "no salt added."  Avoid foods that contain monosodium glutamate (MSG). MSG is  sometimes added to [place] food and some canned foods.  Check food labels (Nutrition Facts labels) on foods to learn how much sodium is in one serving.  Eat more home-cooked food and less restaurant, buffet, and fast food.  When eating at a restaurant, ask that your food be prepared with less salt, or no salt if possible.  HOW DO I READ FOOD LABELS FOR SODIUM INFORMATION? The Nutrition Facts label lists the amount of sodium in one serving of the food. If you eat more than one serving, you must multiply the listed amount of sodium by the number of servings. Food labels may also identify foods as:  Sodium free--Less than 5 mg in a serving.  Very low sodium--35 mg or less in a serving.  Low sodium--140 mg or less in a serving.  Light in sodium--50% less sodium in a serving. For example, if a food that usually has 300 mg of sodium is changed to become light in sodium, it will have 150 mg of sodium.  Reduced sodium--25% less sodium in a serving. For example, if a food that usually has 400 mg of sodium is changed to reduced sodium, it will have 300 mg of sodium. WHAT FOODS CAN I EAT? Grains Low-sodium cereals, including oats, puffed wheat and rice, and shredded wheat cereals. Low-sodium crackers. Unsalted rice and pasta. Lower-sodium bread.  Vegetables Frozen or fresh vegetables. Low-sodium or reduced-sodium canned vegetables. Low-sodium or reduced-sodium tomato sauce and paste. Low-sodium or reduced-sodium tomato and vegetable juices.  Fruits Fresh,  frozen, and canned fruit. Fruit juice.  Meat and Other Protein Products Low-sodium canned tuna and salmon. Fresh or frozen meat, poultry, seafood, and fish. Lamb. Unsalted nuts. Dried beans, peas, and lentils without added salt. Unsalted canned beans. Homemade soups without salt. Eggs.  Dairy Milk. Soy milk. Ricotta cheese. Low-sodium or reduced-sodium cheeses. Yogurt.  Condiments Fresh and dried herbs and spices. Salt-free  seasonings. Onion and garlic powders. Low-sodium varieties of mustard and ketchup. Fresh or refrigerated horseradish. Lemon juice.  Fats and Oils Reduced-sodium salad dressings. Unsalted butter.  Other Unsalted popcorn and pretzels.  The items listed above may not be a complete list of recommended foods or beverages. Contact your dietitian for more options. WHAT FOODS ARE NOT RECOMMENDED? Grains Instant hot cereals. Bread stuffing, pancake, and biscuit mixes. Croutons. Seasoned rice or pasta mixes. Noodle soup cups. Boxed or frozen macaroni and cheese. Self-rising flour. Regular salted crackers. Vegetables Regular canned vegetables. Regular canned tomato sauce and paste. Regular tomato and vegetable juices. Frozen vegetables in sauces. Salted [place] fries. Olives. Angie Fava. Relishes. Sauerkraut. Salsa. Meat and Other Protein Products Salted, canned, smoked, spiced, or pickled meats, seafood, or fish. Bacon, ham, sausage, hot dogs, corned beef, chipped beef, and packaged luncheon meats. Salt pork. Jerky. Pickled herring. Anchovies, regular canned tuna, and sardines. Salted nuts. Dairy Processed cheese and cheese spreads. Cheese curds. Blue cheese and cottage cheese. Buttermilk.  Condiments Onion and garlic salt, seasoned salt, table salt, and sea salt. Canned and packaged gravies. Worcestershire sauce. Tartar sauce. Barbecue sauce. Teriyaki sauce. Soy sauce, including reduced sodium. Steak sauce. Fish sauce. Oyster sauce. Cocktail sauce. Horseradish that you find on the shelf. Regular ketchup and mustard. Meat flavorings and tenderizers. Bouillon cubes. Hot sauce. Tabasco sauce. Marinades. Taco seasonings. Relishes. Fats and Oils Regular salad dressings. Salted butter. Margarine. Ghee. Bacon fat.  Other Potato and tortilla chips. Corn chips and puffs. Salted popcorn and pretzels. Canned or dried soups. Pizza. Frozen entrees and pot pies.  The items listed above may not be a complete  list of foods and beverages to avoid. Contact your dietitian for more information.   This information is not intended to replace advice given to you by your health care provider. Make sure you discuss any questions you have with your health care provider.   Document Released: 09/20/2001 Document Revised: 04/21/2014 Document Reviewed: 02/02/2013 Elsevier Interactive Patient Education [employer].

## 2015-11-29 NOTE — Progress Notes (Signed)
Office Visit    Patient Name: Brandi Melton Date of Encounter: 11/29/2015  Primary Care Provider:  No PCP Per Patient Primary Cardiologist:  Lizbeth Bark, MD   Chief Complaint    82 year old female with a prior history of diastolic CHF, hypertension, hyperlipidemia, diabetes, obesity, and chronic kidney disease III, who presents for follow-up after recent hospitalization related to volume overload.  Past Medical History    Past Medical History:  Diagnosis Date  . Arthritis    "hips before they were replaced, right knee now" (11/09/2015)  . Chronic diastolic (congestive) heart failure (HCC)    a. echo 06/2015: EF 65-70%, Grade 1 DD, mitral valve calcification.  . CKD (chronic kidney disease), stage II   . Depression   . H/O cardiac catheterization    a. h/o cardiac catheterization 05/2013 in [place] showing only minimal CAD with increased filling pressure.  . Hyperlipemia   . Hypertensive heart disease   . Morbid obesity ([place])   . Palsy, Bell's   . RA (rheumatoid arthritis) ([place])   . Sleep apnea    "suppose to wear mask; couldn't" (11/09/2015)  . Type II diabetes mellitus ([place])    Past Surgical History:  Procedure Laterality Date  . APPENDECTOMY  1966  . CARDIAC CATHETERIZATION  05/2013  . CARPAL TUNNEL RELEASE Right 08/08/2014   Procedure: OPEN CARPAL TUNNEL RELEASE;  Surgeon: Roseanne Kaufman, MD;  Location: [employer] [facility];  Service: Orthopedics;  Laterality: Right;  . CATARACT EXTRACTION W/ INTRAOCULAR LENS  IMPLANT, BILATERAL Bilateral   . CHOLECYSTECTOMY OPEN  1966  . COLONOSCOPY    . FRACTURE SURGERY    . JOINT REPLACEMENT    . KNEE ARTHROPLASTY Left   . ORIF WRIST FRACTURE Right 08/08/2014   Procedure: Right OPEN REDUCTION INTERNAL FIXATION (ORIF) WRIST FRACTURE WITH REPAIR AND RECONST/ALLOGRAFT AND BONE GRAFT;  Surgeon: Roseanne Kaufman, MD;  Location: [facility];  Service: Orthopedics;  Laterality: Right;  . TOTAL HIP ARTHROPLASTY Bilateral     Allergies  No Known  Allergies  History of Present Illness    82 year old female with a prior history of chronic diastolic CHF, hypertension, hyperlipidemia, obesity, sleep apnea, nonobstructive CAD, Bell's palsy, and stage II to 3 chronic kidney disease. She was recently evaluated in clinic secondary to ongoing dyspnea on exertion and worsening lower extremity edema, increasing abdominal girth, hypoxia, and weight gain. She was admitted from the office for IV diuresis. During hospitalization, she was a net negative of 10.5 L with reduction in weight from 253 pounds on admission to 238 pounds at the time of discharge. Renal function was relatively stable throughout admission. She was also treated for a toenail infection and gout flair during hospitalization. She was discharged home on August 4 and since then, she has noted a 5 pound weight gain on her scale at home. She was having lower extremity swelling and as of Monday this week, she started wearing compression hose. In that setting, lower extremity swelling has improved some. She is currently taking Lasix 80 mg twice a day, and she is concerned that we may be drying out her kidneys. We had a long discussion about diet and though she's not having salt to food, she does frequently go out to lunch with her daughter and just since hospitalization, has eaten things like hot dogs, pizza, French onion soup, cold cuts home and lasagna. She has chronic orthopnea and sleeps in a recliner. She denies PND, dizziness, syncope, early satiety, or change in abdominal girth at  this point. She has not been having chest pain.   Home Medications    Prior to Admission medications   Medication Sig Start Date End Date Taking? Authorizing Provider  aspirin EC 81 MG tablet Take 81 mg by mouth daily.    Historical Provider, MD  calcium carbonate (TUMS EX) 750 MG chewable tablet Chew 1 tablet by mouth daily.    Historical Provider, MD  carvedilol (COREG CR) 80 MG 24 hr capsule Take 1 capsule (80  mg total) by mouth daily. 06/25/15   Fay Records, MD  chlorhexidine (PERIDEX) 0.12 % solution Use as directed 5 mLs in the mouth or throat 2 (two) times daily.  01/17/15   Historical Provider, MD  cholecalciferol (VITAMIN D) 1000 units tablet Take 3,000 Units by mouth daily.    Historical Provider, MD  ezetimibe-simvastatin (VYTORIN) 10-40 MG per tablet Take 1 tablet by mouth daily.     Historical Provider, MD  folic acid (FOLVITE) A999333 MCG tablet Take 400 mcg by mouth daily.    Historical Provider, MD  furosemide (LASIX) 80 MG tablet Take 1 tablet (80 mg total) by mouth 2 (two) times daily. 11/16/15   Erma Heritage, PA  insulin aspart (NOVOLOG) 100 UNIT/ML injection Inject 30 Units into the skin 4 (four) times daily.    Historical Provider, MD  Insulin Detemir (LEVEMIR) 100 UNIT/ML Pen Inject 80 Units into the skin 2 (two) times daily.     Historical Provider, MD  magnesium oxide (MAG-OX) 400 MG tablet Take 400 mg by mouth daily.    Historical Provider, MD  potassium chloride SA (K-DUR,KLOR-CON) 20 MEQ tablet Take 20 mEq by mouth daily.    Historical Provider, MD  telmisartan (MICARDIS) 20 MG tablet Take 20 mg by mouth daily.    Historical Provider, MD  Vitamin D, Ergocalciferol, (DRISDOL) 50000 UNITS CAPS capsule Take 50,000 Units by mouth every Wednesday.     Historical Provider, MD    Review of Systems    She continues to have some degree of DOE and chronic orthopnea, sleeping in a recliner nightly.  She has had lower ext swelling and wt gain as above.  She denies chest pain, palpitations, pnd, n, v, dizziness, syncope, or early satiety. All other systems reviewed and are otherwise negative except as noted above.  Physical Exam    VS:  BP (!) 100/42   Pulse 64   Ht 5\' 3"  (1.6 m)   Wt 248 lb 12.8 oz (112.9 kg)   BMI 44.07 kg/m  , BMI Body mass index is 44.07 kg/m. GEN: obese, in no acute distress.  HEENT: normal.  Neck: Supple.  JVP ~ 10cm.  No carotid bruits, or masses. Cardiac:  RRR, no murmurs, rubs, or gallops. No clubbing, cyanosis, edema.  Radials/DP/PT 2+ and equal bilaterally.  Respiratory:  Respirations regular and unlabored, clear to auscultation bilaterally. GI: Soft, obese, nontender, nondistended, BS + x 4. MS: no deformity or atrophy. Skin: warm and dry, no rash. Neuro:  Strength and sensation are intact. Psych: Normal affect.  Accessory Clinical Findings    None  Assessment & Plan    1.  Chronic diastolic congestive heart failure: Status post recent hospitalization due to volume overload. She diuresed significantly, down to a weight of 238 pounds, which was a 15 pound weight loss. Unfortunately, her wt is up 5 lbs on her scale @ home since 8/4.  We had a long discussion today about dietary indiscretions.  Though she is not adding salt  to food, she and her dtr often go out to eat for lunch and just since d/c on 8/4, she has had french onion soup, a cold cut sandwich, a hot dog, and pizza.  On exam, she does not have significant volume excess and in fact, she has no lower ext swelling - this apparently improved after she started wearing compression hose earlier this week.  I will check a bmet today to re-eval renal fxn and lytes on lasix 80 bid.  Provided that renal fxn is stable, I am not inclined to change her lasix dose @ this time, and instead have stressed the importance of reducing her sodium intake @ home.  Further, I've asked her to keep her legs elevated when sitting or sleeping.  She spends a lot of time in her recliner, and even sleeps in it, but her legs are still in somewhat of a dependent position.  She will try to place a pillow under her feet @ night in an effort to get them up higher.  I also stressed that she call us with wt gains of 2-3 lbs overnight or 5 lbs over the course of the week.  I suspect that if either her renal fxn is worse on lasix 80 bid or if she continues to put on fluid despite dietary changes, we will either have to add some  amount of metolazone to her weekly regimen or switch her to torsemide.  OTW, HR/BP are well controlled.  2. Hypertensive heart disease:  BP stable on  blocker, ARB, lasix.  3.  HL:  On vytorin.  Followed by pcp.  4.  CKD III:  F/u bmet today to ensure stability.  5.  Type II DM:  On levemir.  Followed by PCP.  6.  Morbid Obesity:  Chronic issue.  High caloric intake with limited activity.  Unfortunately, I don't think she has the capability to change this.  7.  OSA:  CPAP noncompliant - doesn't like mask.  8.  Gout flare:  She was treated with 2 doses of colchicine during hospitalization.  This has improved.  She may require renally dosed allopurinol at some point.  9.  Disposition:  F/u bmet today.  F/u with APP on Dr. Harrington Challenger' team in 1-2 wks.   Murray Hodgkins, NP 11/29/2015, 9:34 AM

## 2015-12-03 ENCOUNTER — Telehealth: Payer: Self-pay | Admitting: Internal Medicine

## 2015-12-03 NOTE — Telephone Encounter (Signed)
Patient informed. Verbalizes understanding and agreement. Has f/u on 12/10/15. ------  Notes Recorded by Jeanann Lewandowsky, RMA on 11/30/2015 at 11:03 AM EDT Lmptcb jw 11/30/15 ------  Notes Recorded by Rogelia Mire, NP on 11/29/2015 at 5:20 PM EDT Renal fxn is stable. Stick with current dose of lasix as we discussed in clinic. WATCH SALT!!!

## 2015-12-03 NOTE — Telephone Encounter (Signed)
Left message for patient to call back, advised also that I will try her back this afternoon.

## 2015-12-03 NOTE — Telephone Encounter (Signed)
New message     Patient calling back to speak with nurse from 8/18.  Regarding test results.

## 2015-12-07 ENCOUNTER — Encounter: Payer: Self-pay | Admitting: Physician Assistant

## 2015-12-09 NOTE — Progress Notes (Signed)
Cardiology Office Note:    Date:  12/10/2015   ID:  Brandi Melton, DOB December 30, 1930, MRN [id number]  PCP:  No PCP Per Patient  Cardiologist:  Dr. Dorris Carnes   Electrophysiologist:  n/a  Referring MD: No ref. provider found   Chief Complaint  Patient presents with  . Follow-up    CHF    History of Present Illness:    Brandi Melton is a 82 y.o. female with a hx of chronic diastolic CHF, HTN, HL, sleep apnea, CKD, rheumatoid arthritis. She lives at [facility].  She was admitted in [id number] with a/c diastolic CHF.  She was DC on Lasix 80 bid and Metolazone 2.5 twice a week.  She has been seen in the office several times since.  She has continued to have issues with volume excess.  Her diuretics have been adjusted several times. At one point she was on Metolazone once daily.  She has had issues with high salt diet.  She was ultimately admitted [id number] with a/c diastolic CHF. She had failed OP management.  DC weight was 238 pounds. She was seen in follow-up in the office by Ignacia Bayley, NP on 11/29/15.  Her weight was up 5 pounds and she had LE edema. She admitted to significant dietary indiscretion with salt. Diuretic dose was not adjusted. She was asked to maintain a low-salt diet. She returns for close follow-up.  She is here with her daughter.  The patient feels that she is continuing to retain fluid.  She mainly notes increasing abdominal girth. She has been sleeping in a recliner for years.  She denies PND.  She notes increasing LE edema.  She wears compression stockings.  She denies syncope.  She denies chest pain. She denies coughing or wheezing. She denies any bleeding issues.     Prior CV studies that were reviewed today include:    Venous Duplex 3/17 No evidence of obvious deep vein or superficial thrombosis involving the right lower extremity and left lower extremity.  Echo 3/17 Vigorous LVF, EF 65-70%, normal wall motion, grade 1 diastolic dysfunction, severe MAC, no regurgitation,  normal RVSF  LHC 2/15 ([place], [place]) EF 65-70% LCx minimal mid disease LAD minimal disease RCA minimal disease PAP 50/16 mmHg, mean PAP 26 mmHg  Past Medical History:  Diagnosis Date  . Arthritis    "hips before they were replaced, right knee now" (11/09/2015)  . Chronic diastolic (congestive) heart failure (HCC)    a. echo 06/2015: EF 65-70%, Grade 1 DD, mitral valve calcification.  . CKD (chronic kidney disease), stage III   . Depression   . H/O cardiac catheterization    a. h/o cardiac catheterization 05/2013 in [place] showing only minimal CAD with increased filling pressure.  . Hyperlipemia   . Hypertensive heart disease   . Morbid obesity ([name])   . Palsy, Bell's   . RA (rheumatoid arthritis) ([place])   . Sleep apnea    "suppose to wear mask; couldn't" (11/09/2015)  . Type II diabetes mellitus ([place])     Past Surgical History:  Procedure Laterality Date  . APPENDECTOMY  1966  . CARDIAC CATHETERIZATION  05/2013  . CARPAL TUNNEL RELEASE Right 08/08/2014   Procedure: OPEN CARPAL TUNNEL RELEASE;  Surgeon: Roseanne Kaufman, MD;  Location: [facility];  Service: Orthopedics;  Laterality: Right;  . CATARACT EXTRACTION W/ INTRAOCULAR LENS  IMPLANT, BILATERAL Bilateral   . CHOLECYSTECTOMY OPEN  1966  . COLONOSCOPY    . FRACTURE SURGERY    .  JOINT REPLACEMENT    . KNEE ARTHROPLASTY Left   . ORIF WRIST FRACTURE Right 08/08/2014   Procedure: Right OPEN REDUCTION INTERNAL FIXATION (ORIF) WRIST FRACTURE WITH REPAIR AND RECONST/ALLOGRAFT AND BONE GRAFT;  Surgeon: Roseanne Kaufman, MD;  Location: [facility];  Service: Orthopedics;  Laterality: Right;  . TOTAL HIP ARTHROPLASTY Bilateral     Current Medications: Outpatient Medications Prior to Visit  Medication Sig Dispense Refill  . aspirin EC 81 MG tablet Take 81 mg by mouth daily.    . calcium carbonate (TUMS EX) 750 MG chewable tablet Chew 1 tablet by mouth daily.    . carvedilol (COREG CR) 80 MG 24 hr capsule Take 1 capsule  (80 mg total) by mouth daily. 90 capsule 3  . chlorhexidine (PERIDEX) 0.12 % solution Use as directed 5 mLs in the mouth or throat 2 (two) times daily.     . cholecalciferol (VITAMIN D) 1000 units tablet Take 3,000 Units by mouth daily.    Marland Kitchen ezetimibe-simvastatin (VYTORIN) 10-40 MG per tablet Take 1 tablet by mouth daily.     . folic acid (FOLVITE) A999333 MCG tablet Take 400 mcg by mouth daily.    . furosemide (LASIX) 80 MG tablet Take 1 tablet (80 mg total) by mouth 2 (two) times daily. 60 tablet 0  . insulin aspart (NOVOLOG) 100 UNIT/ML injection Inject 30 Units into the skin 4 (four) times daily.    . Insulin Detemir (LEVEMIR) 100 UNIT/ML Pen Inject 80 Units into the skin 2 (two) times daily.     . magnesium oxide (MAG-OX) 400 MG tablet Take 400 mg by mouth daily.    . potassium chloride SA (K-DUR,KLOR-CON) 20 MEQ tablet Take 20 mEq by mouth daily.    Marland Kitchen telmisartan (MICARDIS) 20 MG tablet Take 20 mg by mouth daily.    . Vitamin D, Ergocalciferol, (DRISDOL) 50000 UNITS CAPS capsule Take 50,000 Units by mouth every Wednesday.      No facility-administered medications prior to visit.       Allergies:   Review of patient's allergies indicates no known allergies.   Social History   Social History  . Marital status: Widowed    Spouse name: N/A  . Number of children: N/A  . Years of education: N/A   Social History Main Topics  . Smoking status: Never Smoker  . Smokeless tobacco: Never Used  . Alcohol use 0.0 oz/week     Comment: 11/09/2015 "might have glass of champaign on special occasion"  . Drug use: No  . Sexual activity: No   Other Topics Concern  . None   Social History Narrative  . None     Family History:  The patient's family history includes Stroke in her mother; Sudden death in her mother.   ROS:   Please see the history of present illness.    Review of Systems  Constitution: Positive for malaise/fatigue.  Cardiovascular: Positive for dyspnea on exertion and leg  swelling.   All other systems reviewed and are negative.   EKGs/Labs/Other Test Reviewed:    EKG:  EKG is not ordered today.  The ekg ordered today demonstrates n/a  Recent Labs: 11/07/2015: ALT 24; B Natriuretic Peptide 89.1; Hemoglobin 11.2; Platelets 208; TSH 1.824 11/16/2015: Magnesium 2.5 11/29/2015: BUN 38; Creat 1.66; Potassium 4.3; Sodium 143   Recent Lipid Panel No results found for: CHOL, TRIG, HDL, CHOLHDL, VLDL, LDLCALC, LDLDIRECT   Physical Exam:    VS:  BP (!) 120/58   Pulse 67  Ht 5\' 3"  (1.6 m)   Wt 255 lb 6.4 oz (115.8 kg)   BMI 45.24 kg/m     Wt Readings from Last 3 Encounters:  12/10/15 255 lb 6.4 oz (115.8 kg)  11/29/15 248 lb 12.8 oz (112.9 kg)  11/16/15 238 lb 3.2 oz (108 kg)     Physical Exam  Constitutional: She is oriented to person, place, and time. She appears well-developed and well-nourished. No distress.  HENT:  Head: Normocephalic and atraumatic.  Eyes: No scleral icterus.  Neck:  I cannot assess JVD  Cardiovascular: Normal rate, regular rhythm and normal heart sounds.   No murmur heard. Pulmonary/Chest: Effort normal.  Bibasilar crackles noted, no wheezing  Abdominal: She exhibits distension.  Musculoskeletal: She exhibits edema.  Trace-1+ bilateral LE edema; compression stockings in place.   No presacral edema noted.   Neurological: She is alert and oriented to person, place, and time.  Skin: Skin is warm and dry.  Psychiatric: She has a normal mood and affect.    ASSESSMENT:    1. Chronic diastolic (congestive) heart failure ([place])   2. Hypertensive heart disease with heart failure ([place])   3. CKD (chronic kidney disease), stage III   4. OSA (obstructive sleep apnea)    PLAN:    In order of problems listed above:  1. A/C Diastolic CHF - She has developed worsening volume excess since last seen.  She was 239 lbs at home at DC from the hospital.  Her weight at home was 245 lbs today.  Her volume excess is mainly felt in her  abdomen. She has been non-compliant with salt in her diet.  Her daughter notes that she has been doing better with her salt intake.  However, it still sounds like she gets too much salt in her diet.  Her exam is difficult given her body habitus.    -  Continue to limit dietary sodium.    -  Fluid restrict to 50-60 oz per day  -  Resume Metolazone at 2.5 mg on Tues, Thurs, Sat this week 30 mins before AM Lasix.  -  Extra K+ 20 mEq on Metolazone days.  -  BMET in 1 week  -  FU with me in 1 week.  If worse, will likely need to be admitted again for CHF.   -  Consider referral to CHF Clinic if volume continues to be difficult to manage.   2. HTN Heart Disease with CHF - BP is controlled.  Continue current regimen.    3. CKD - Recent creatinine was stable on 11/29/15 was 1.66.  Repeat BMET in 1 week given recent adjustment in diuretics.  4. Sleep apnea - She is unable to tolerate facemask.   Medication Adjustments/Labs and Tests Ordered: Current medicines are reviewed at length with the patient today.  Concerns regarding medicines are outlined above.  Medication changes, Labs and Tests ordered today are outlined in the Patient Instructions noted below. Patient Instructions  Medication Instructions:  TAKE METOLAZONE 2.5 MG Tuesday 8/29, Thursday 8/31 AND Saturday 9/2; ON THESE DAYS YOU WILL TAKE THE METOLAZONE 30 MINUTES BEFORE YOUR MORNING DOSE OF LASIX; YOU WILL ALSO ON THESE DAYS MAKE SURE TO TAKE AN EXTRA 20 MEQ POTASSIUM TABLET Labwork: BMET IN 1 WEEK AT YOUR FOLLOW UP WITH [place], PAC Testing/Procedures: NONE Follow-Up: Felicia Bloomquist, [facility] ON 12/18/15 @ 11:15; PLEASE ARRIVE 15 MINUTES EARLY FOR REGISTRATION Any Other Special Instructions Will Be Listed Below (If Applicable). If you need a  refill on your cardiac medications before your next appointment, please call your pharmacy.  Signed, Richardson Dopp, PA-C  12/10/2015 12:20 PM    [facility] [place],  [place], [place]  [address] Phone: [phone]; Fax: [phone]

## 2015-12-10 ENCOUNTER — Encounter: Payer: Self-pay | Admitting: Physician Assistant

## 2015-12-10 ENCOUNTER — Ambulatory Visit (INDEPENDENT_AMBULATORY_CARE_PROVIDER_SITE_OTHER): Payer: Medicare Other | Admitting: Physician Assistant

## 2015-12-10 VITALS — BP 120/58 | HR 67 | Ht 63.0 in | Wt 255.4 lb

## 2015-12-10 DIAGNOSIS — I5032 Chronic diastolic (congestive) heart failure: Secondary | ICD-10-CM

## 2015-12-10 DIAGNOSIS — I2510 Atherosclerotic heart disease of native coronary artery without angina pectoris: Secondary | ICD-10-CM

## 2015-12-10 DIAGNOSIS — G4733 Obstructive sleep apnea (adult) (pediatric): Secondary | ICD-10-CM

## 2015-12-10 DIAGNOSIS — I110 Hypertensive heart disease with heart failure: Secondary | ICD-10-CM

## 2015-12-10 DIAGNOSIS — N183 Chronic kidney disease, stage 3 unspecified: Secondary | ICD-10-CM

## 2015-12-10 NOTE — Patient Instructions (Addendum)
Medication Instructions:  TAKE METOLAZONE 2.5 MG Tuesday 8/29, Thursday 8/31 AND Saturday 9/2; ON THESE DAYS YOU WILL TAKE THE METOLAZONE 30 MINUTES BEFORE YOUR MORNING DOSE OF LASIX; YOU WILL ALSO ON THESE DAYS MAKE SURE TO TAKE AN EXTRA 20 MEQ POTASSIUM TABLET Labwork: BMET IN 1 WEEK AT YOUR FOLLOW UP WITH [place], PAC Testing/Procedures: NONE Follow-Up: SCOTT WEAVER, [facility] ON 12/18/15 @ 11:15; PLEASE ARRIVE 15 MINUTES EARLY FOR REGISTRATION Any Other Special Instructions Will Be Listed Below (If Applicable). If you need a refill on your cardiac medications before your next appointment, please call your pharmacy.

## 2015-12-12 DIAGNOSIS — G4733 Obstructive sleep apnea (adult) (pediatric): Secondary | ICD-10-CM | POA: Diagnosis not present

## 2015-12-12 DIAGNOSIS — N184 Chronic kidney disease, stage 4 (severe): Secondary | ICD-10-CM | POA: Diagnosis not present

## 2015-12-12 DIAGNOSIS — I130 Hypertensive heart and chronic kidney disease with heart failure and stage 1 through stage 4 chronic kidney disease, or unspecified chronic kidney disease: Secondary | ICD-10-CM | POA: Diagnosis not present

## 2015-12-12 DIAGNOSIS — I5032 Chronic diastolic (congestive) heart failure: Secondary | ICD-10-CM | POA: Diagnosis not present

## 2015-12-12 DIAGNOSIS — D692 Other nonthrombocytopenic purpura: Secondary | ICD-10-CM | POA: Diagnosis not present

## 2015-12-12 DIAGNOSIS — Z6841 Body Mass Index (BMI) 40.0 and over, adult: Secondary | ICD-10-CM | POA: Diagnosis not present

## 2015-12-12 DIAGNOSIS — I10 Essential (primary) hypertension: Secondary | ICD-10-CM | POA: Diagnosis not present

## 2015-12-12 DIAGNOSIS — E7800 Pure hypercholesterolemia, unspecified: Secondary | ICD-10-CM | POA: Diagnosis not present

## 2015-12-12 DIAGNOSIS — E1151 Type 2 diabetes mellitus with diabetic peripheral angiopathy without gangrene: Secondary | ICD-10-CM | POA: Diagnosis not present

## 2015-12-17 NOTE — Progress Notes (Signed)
Cardiology Office Note:    Date:  12/18/2015   ID:  Brandi Melton, DOB 1930-05-22, MRN [id number]  PCP:  No PCP Per Patient  Cardiologist:  Dr. Dorris Carnes   Electrophysiologist:  n/a  Referring MD: No ref. provider found   Chief Complaint  Patient presents with  . Follow-up    CHF    History of Present Illness:    Brandi Melton is a 82 y.o. female with a hx of chronic diastolic CHF, HTN, HL, sleep apnea, CKD, rheumatoid arthritis. She lives at [facility] in the independent leaving facility.  She was admitted in 3/17 and again in A999333 with a/c diastolic CHF.  Diuresis has been augmented with Metolazone in the past.  She has also eaten a high salt diet in the past.  When she was DC on 11/16/15, her weight was 238 lbs.  I saw her 12/10/15 and her weight was up to 255 lbs.  She was volume overloaded on exam.  We again discussed low salt diet.  I adjusted her diuretics and had her take 3 doses of Metolazone over 1 week.  She returns for FU.  She returns with her daughter.  Her daughter's son is in an Oncology fellowship in [place], [place] and his wife is having twins in April.  Brandi Melton is very excited and is looking forward to this.  She has done a great job limiting her salt.  Her weight is down 10 lbs.  She notes improved LE edema.  She continues to be short of breath with mild to mod activities.  There has been no change.  She denies syncope. She denies chest pain.  She denies significant cough. She has slept in a recliner for years.       Prior CV studies that were reviewed today include:    Venous Duplex 3/17 No evidence of obvious deep vein or superficial thrombosis involving the right lower extremity and left lower extremity.  Echo 3/17 Vigorous LVF, EF 65-70%, normal wall motion, grade 1 diastolic dysfunction, severe MAC, no regurgitation, normal RVSF  LHC 2/15 ([place], [place]) EF 65-70% LCx minimal mid disease LAD minimal disease RCA minimal disease PAP 50/16  mmHg, mean PAP 26 mmHg  Past Medical History:  Diagnosis Date  . Arthritis    "hips before they were replaced, right knee now" (11/09/2015)  . Chronic diastolic (congestive) heart failure (HCC)    a. echo 06/2015: EF 65-70%, Grade 1 DD, mitral valve calcification.  . CKD (chronic kidney disease), stage III   . Depression   . H/O cardiac catheterization    a. h/o cardiac catheterization 05/2013 in [place] showing only minimal CAD with increased filling pressure.  . Hyperlipemia   . Hypertensive heart disease   . Morbid obesity ([place])   . Palsy, Bell's   . RA (rheumatoid arthritis) ([facility])   . Sleep apnea    "suppose to wear mask; couldn't" (11/09/2015)  . Type II diabetes mellitus ([facility])     Past Surgical History:  Procedure Laterality Date  . APPENDECTOMY  1966  . CARDIAC CATHETERIZATION  05/2013  . CARPAL TUNNEL RELEASE Right 08/08/2014   Procedure: OPEN CARPAL TUNNEL RELEASE;  Surgeon: Roseanne Kaufman, MD;  Location: [facility];  Service: Orthopedics;  Laterality: Right;  . CATARACT EXTRACTION W/ INTRAOCULAR LENS  IMPLANT, BILATERAL Bilateral   . CHOLECYSTECTOMY OPEN  1966  . COLONOSCOPY    . FRACTURE SURGERY    . JOINT REPLACEMENT    .  KNEE ARTHROPLASTY Left   . ORIF WRIST FRACTURE Right 08/08/2014   Procedure: Right OPEN REDUCTION INTERNAL FIXATION (ORIF) WRIST FRACTURE WITH REPAIR AND RECONST/ALLOGRAFT AND BONE GRAFT;  Surgeon: Roseanne Kaufman, MD;  Location: [facility];  Service: Orthopedics;  Laterality: Right;  . TOTAL HIP ARTHROPLASTY Bilateral     Current Medications: Outpatient Medications Prior to Visit  Medication Sig Dispense Refill  . aspirin EC 81 MG tablet Take 81 mg by mouth daily.    . calcium carbonate (TUMS EX) 750 MG chewable tablet Chew 1 tablet by mouth daily.    . carvedilol (COREG CR) 80 MG 24 hr capsule Take 1 capsule (80 mg total) by mouth daily. 90 capsule 3  . chlorhexidine (PERIDEX) 0.12 % solution Use as directed 5 mLs in the mouth or throat 2 (two) times  daily.     . cholecalciferol (VITAMIN D) 1000 units tablet Take 3,000 Units by mouth daily.    Marland Kitchen ezetimibe-simvastatin (VYTORIN) 10-40 MG per tablet Take 1 tablet by mouth daily.     . folic acid (FOLVITE) A999333 MCG tablet Take 400 mcg by mouth daily.    . furosemide (LASIX) 80 MG tablet Take 1 tablet (80 mg total) by mouth 2 (two) times daily. 60 tablet 0  . insulin aspart (NOVOLOG) 100 UNIT/ML injection Inject 30 Units into the skin 4 (four) times daily.    . Insulin Detemir (LEVEMIR) 100 UNIT/ML Pen Inject 80 Units into the skin 2 (two) times daily.     . magnesium oxide (MAG-OX) 400 MG tablet Take 400 mg by mouth daily.    . potassium chloride SA (K-DUR,KLOR-CON) 20 MEQ tablet Take 20 mEq by mouth daily.    Marland Kitchen telmisartan (MICARDIS) 20 MG tablet Take 20 mg by mouth daily.    . Vitamin D, Ergocalciferol, (DRISDOL) 50000 UNITS CAPS capsule Take 50,000 Units by mouth every Wednesday.      No facility-administered medications prior to visit.       Allergies:   Review of patient's allergies indicates no known allergies.   Social History   Social History  . Marital status: Widowed    Spouse name: N/A  . Number of children: N/A  . Years of education: N/A   Social History Main Topics  . Smoking status: Never Smoker  . Smokeless tobacco: Never Used  . Alcohol use 0.0 oz/week     Comment: 11/09/2015 "might have glass of champaign on special occasion"  . Drug use: No  . Sexual activity: No   Other Topics Concern  . None   Social History Narrative  . None     Family History:  The patient's family history includes Stroke in her mother; Sudden death in her mother.   ROS:   Please see the history of present illness.    Review of Systems  Cardiovascular: Positive for dyspnea on exertion and leg swelling.   All other systems reviewed and are negative.   EKGs/Labs/Other Test Reviewed:    EKG:  EKG is not ordered today.  The ekg ordered today demonstrates n/a  Recent  Labs: 11/07/2015: ALT 24; B Natriuretic Peptide 89.1; Hemoglobin 11.2; Platelets 208; TSH 1.824 11/16/2015: Magnesium 2.5 11/29/2015: BUN 38; Creat 1.66; Potassium 4.3; Sodium 143   Recent Lipid Panel No results found for: CHOL, TRIG, HDL, CHOLHDL, VLDL, LDLCALC, LDLDIRECT   Physical Exam:    VS:  BP (!) 92/44   Pulse 64   Ht 5\' 3"  (1.6 m)   Wt 245 lb 12.8  oz (111.5 kg)   BMI 43.54 kg/m     Wt Readings from Last 3 Encounters:  12/18/15 245 lb 12.8 oz (111.5 kg)  12/10/15 255 lb 6.4 oz (115.8 kg)  11/29/15 248 lb 12.8 oz (112.9 kg)     Physical Exam  Constitutional: She is oriented to person, place, and time. She appears well-developed and well-nourished. No distress.  HENT:  Head: Normocephalic and atraumatic.  Eyes: No scleral icterus.  Neck: No JVD present.  Cardiovascular: Normal rate and normal heart sounds.   No murmur heard. Pulmonary/Chest: Effort normal. She has no wheezes. She has no rales.  Abdominal: Soft. There is no tenderness.  Musculoskeletal: She exhibits edema.  Trace bilateral LE edema.  Compression stockings in place.  Neurological: She is alert and oriented to person, place, and time.  Skin: Skin is warm and dry.  Psychiatric: She has a normal mood and affect.    ASSESSMENT:    1. Chronic diastolic (congestive) heart failure ([facility])   2. Hypertensive heart disease with heart failure ([facility])   3. CKD (chronic kidney disease), stage III    PLAN:    In order of problems listed above:  1. Chronic Diastolic CHF - Her volume status is improved.  She is doing better to limit salt.  She took Metolazone every other day (4 doses) instead of just 3 doses.    -  Continue Lasix 80 mg bid  -  Continue Metolazone 2.5 mg once a week  -  BMET today  -  If volume increases on once a week Metolazone, will change to 2 x a week.   2. HTN Heart Disease with CHF - BP is somewhat low today.  She is not symptomatic.  Continue current regimen.    3. CKD - SCr on 11/29/15  was 1.66.  Repeat BMET today.  Medication Adjustments/Labs and Tests Ordered: Current medicines are reviewed at length with the patient today.  Concerns regarding medicines are outlined above.  Medication changes, Labs and Tests ordered today are outlined in the Patient Instructions noted below. Patient Instructions  Medication Instructions:  Change Metolazone to 1 day a week. You can take every Thursday.  Labwork: Today - BMET  Testing/Procedures: None   Follow-Up: Dr. Dorris Carnes in 6-8 weeks.  Any Other Special Instructions Will Be Listed Below (If Applicable). If your weight goes up 3 lbs or more in 1 day or you notice your legs are more swollen, call so we can increase the dose of Metolazone. Keep up the great work with limiting your salt intake!  If you need a refill on your cardiac medications before your next appointment, please call your pharmacy.   Signed, Richardson Dopp, PA-C  12/18/2015 11:54 AM    [facility] [place], [place], [place]  [address] Phone: [phone]; Fax: [phone]

## 2015-12-18 ENCOUNTER — Ambulatory Visit (INDEPENDENT_AMBULATORY_CARE_PROVIDER_SITE_OTHER): Payer: Medicare Other | Admitting: Physician Assistant

## 2015-12-18 ENCOUNTER — Encounter: Payer: Self-pay | Admitting: Physician Assistant

## 2015-12-18 VITALS — BP 92/44 | HR 64 | Ht 63.0 in | Wt 245.8 lb

## 2015-12-18 DIAGNOSIS — N183 Chronic kidney disease, stage 3 unspecified: Secondary | ICD-10-CM

## 2015-12-18 DIAGNOSIS — I5032 Chronic diastolic (congestive) heart failure: Secondary | ICD-10-CM

## 2015-12-18 DIAGNOSIS — I2510 Atherosclerotic heart disease of native coronary artery without angina pectoris: Secondary | ICD-10-CM

## 2015-12-18 DIAGNOSIS — I110 Hypertensive heart disease with heart failure: Secondary | ICD-10-CM | POA: Diagnosis not present

## 2015-12-18 LAB — BASIC METABOLIC PANEL
BUN: 58 mg/dL — AB (ref 7–25)
CALCIUM: 9.3 mg/dL (ref 8.6–10.4)
CO2: 34 mmol/L — AB (ref 20–31)
CREATININE: 2.08 mg/dL — AB (ref 0.60–0.88)
Chloride: 94 mmol/L — ABNORMAL LOW (ref 98–110)
GLUCOSE: 161 mg/dL — AB (ref 65–99)
Potassium: 4.3 mmol/L (ref 3.5–5.3)
SODIUM: 139 mmol/L (ref 135–146)

## 2015-12-18 MED ORDER — METOLAZONE 2.5 MG PO TABS: ORAL_TABLET | ORAL | 3 refills | Status: DC

## 2015-12-18 NOTE — Addendum Note (Signed)
Addended by: Domenica Reamer R on: 12/18/2015 12:15 PM   Modules accepted: Orders

## 2015-12-18 NOTE — Patient Instructions (Signed)
Medication Instructions:  Change Metolazone to 1 day a week. You can take every Thursday.  Labwork: Today - BMET  Testing/Procedures: None   Follow-Up: Dr. Dorris Carnes in 6-8 weeks.  Any Other Special Instructions Will Be Listed Below (If Applicable). If your weight goes up 3 lbs or more in 1 day or you notice your legs are more swollen, call so we can increase the dose of Metolazone. Keep up the great work with limiting your salt intake!  If you need a refill on your cardiac medications before your next appointment, please call your pharmacy.

## 2015-12-19 ENCOUNTER — Telehealth: Payer: Self-pay | Admitting: Physician Assistant

## 2015-12-19 DIAGNOSIS — R7989 Other specified abnormal findings of blood chemistry: Secondary | ICD-10-CM

## 2015-12-19 NOTE — Telephone Encounter (Signed)
Informed pt of lab results. Pt verbalized understanding. Pt will repeat BMET on 9/13.

## 2015-12-19 NOTE — Telephone Encounter (Signed)
New message ° ° ° ° °Returning a call to the nurse to get lab results °

## 2015-12-26 ENCOUNTER — Other Ambulatory Visit: Payer: Medicare Other | Admitting: *Deleted

## 2015-12-26 DIAGNOSIS — R748 Abnormal levels of other serum enzymes: Secondary | ICD-10-CM | POA: Diagnosis not present

## 2015-12-26 DIAGNOSIS — R7989 Other specified abnormal findings of blood chemistry: Secondary | ICD-10-CM

## 2015-12-26 LAB — BASIC METABOLIC PANEL
BUN: 69 mg/dL — ABNORMAL HIGH (ref 7–25)
CO2: 31 mmol/L (ref 20–31)
Calcium: 8.8 mg/dL (ref 8.6–10.4)
Chloride: 98 mmol/L (ref 98–110)
Creat: 1.97 mg/dL — ABNORMAL HIGH (ref 0.60–0.88)
Glucose, Bld: 201 mg/dL — ABNORMAL HIGH (ref 65–99)
Potassium: 3.9 mmol/L (ref 3.5–5.3)
Sodium: 140 mmol/L (ref 135–146)

## 2015-12-27 ENCOUNTER — Telehealth: Payer: Self-pay | Admitting: *Deleted

## 2015-12-27 DIAGNOSIS — N183 Chronic kidney disease, stage 3 unspecified: Secondary | ICD-10-CM

## 2015-12-27 DIAGNOSIS — I5033 Acute on chronic diastolic (congestive) heart failure: Secondary | ICD-10-CM

## 2015-12-27 NOTE — Telephone Encounter (Signed)
Pt notified of lab results by phone with verbal understanding. Pt agreeable to plan of care for repeat bmet 9/27

## 2015-12-31 ENCOUNTER — Telehealth: Payer: Self-pay | Admitting: Physician Assistant

## 2015-12-31 NOTE — Telephone Encounter (Signed)
I lmom for pt stating that I call today that she is returning my call from today. Lmom that I s/w her already on 9/14 in regards to her lab results and scheduled repeat bmet for 9/27. I did state on machine that I do not know I someone else called her however it was not me today. State she is welcome tcb if she has any further questions.

## 2015-12-31 NOTE — Telephone Encounter (Signed)
Mrs.Segler is returning your call . Thanks

## 2016-01-08 DIAGNOSIS — E1129 Type 2 diabetes mellitus with other diabetic kidney complication: Secondary | ICD-10-CM | POA: Diagnosis not present

## 2016-01-08 DIAGNOSIS — I10 Essential (primary) hypertension: Secondary | ICD-10-CM | POA: Diagnosis not present

## 2016-01-08 DIAGNOSIS — Z6841 Body Mass Index (BMI) 40.0 and over, adult: Secondary | ICD-10-CM | POA: Diagnosis not present

## 2016-01-08 DIAGNOSIS — R600 Localized edema: Secondary | ICD-10-CM | POA: Diagnosis not present

## 2016-01-08 DIAGNOSIS — N184 Chronic kidney disease, stage 4 (severe): Secondary | ICD-10-CM | POA: Diagnosis not present

## 2016-01-08 DIAGNOSIS — M109 Gout, unspecified: Secondary | ICD-10-CM | POA: Diagnosis not present

## 2016-01-09 ENCOUNTER — Other Ambulatory Visit: Payer: Medicare Other | Admitting: *Deleted

## 2016-01-09 ENCOUNTER — Telehealth: Payer: Self-pay | Admitting: Physician Assistant

## 2016-01-09 DIAGNOSIS — N183 Chronic kidney disease, stage 3 unspecified: Secondary | ICD-10-CM

## 2016-01-09 DIAGNOSIS — I5033 Acute on chronic diastolic (congestive) heart failure: Secondary | ICD-10-CM

## 2016-01-09 LAB — BASIC METABOLIC PANEL
BUN: 43 mg/dL — AB (ref 7–25)
CHLORIDE: 102 mmol/L (ref 98–110)
CO2: 26 mmol/L (ref 20–31)
Calcium: 9.2 mg/dL (ref 8.6–10.4)
Creat: 1.68 mg/dL — ABNORMAL HIGH (ref 0.60–0.88)
GLUCOSE: 173 mg/dL — AB (ref 65–99)
POTASSIUM: 4.2 mmol/L (ref 3.5–5.3)
Sodium: 142 mmol/L (ref 135–146)

## 2016-01-09 NOTE — Telephone Encounter (Signed)
Follow Up:     Returning somebody call,she does not know who called. She is going to to the dinning room to eat and wil return at 6:30. If you do not get her today,would you please call first thing tomorrow morning.

## 2016-01-11 NOTE — Telephone Encounter (Signed)
Pt notified of lab results by phone with verbal understanding.  

## 2016-01-15 DIAGNOSIS — N184 Chronic kidney disease, stage 4 (severe): Secondary | ICD-10-CM | POA: Diagnosis not present

## 2016-01-15 DIAGNOSIS — Z6841 Body Mass Index (BMI) 40.0 and over, adult: Secondary | ICD-10-CM | POA: Diagnosis not present

## 2016-01-15 DIAGNOSIS — R600 Localized edema: Secondary | ICD-10-CM | POA: Diagnosis not present

## 2016-01-15 DIAGNOSIS — E1129 Type 2 diabetes mellitus with other diabetic kidney complication: Secondary | ICD-10-CM | POA: Diagnosis not present

## 2016-01-15 DIAGNOSIS — M109 Gout, unspecified: Secondary | ICD-10-CM | POA: Diagnosis not present

## 2016-02-04 ENCOUNTER — Ambulatory Visit (INDEPENDENT_AMBULATORY_CARE_PROVIDER_SITE_OTHER): Payer: Medicare Other | Admitting: Internal Medicine

## 2016-02-04 ENCOUNTER — Encounter (INDEPENDENT_AMBULATORY_CARE_PROVIDER_SITE_OTHER): Payer: Self-pay

## 2016-02-04 ENCOUNTER — Encounter: Payer: Self-pay | Admitting: Internal Medicine

## 2016-02-04 VITALS — BP 130/56 | HR 73 | Ht 63.0 in | Wt 250.1 lb

## 2016-02-04 DIAGNOSIS — I5032 Chronic diastolic (congestive) heart failure: Secondary | ICD-10-CM | POA: Diagnosis not present

## 2016-02-04 DIAGNOSIS — I2510 Atherosclerotic heart disease of native coronary artery without angina pectoris: Secondary | ICD-10-CM | POA: Diagnosis not present

## 2016-02-04 DIAGNOSIS — I10 Essential (primary) hypertension: Secondary | ICD-10-CM | POA: Diagnosis not present

## 2016-02-04 LAB — CBC WITH DIFFERENTIAL/PLATELET
BASOS PCT: 0 %
Basophils Absolute: 0 cells/uL (ref 0–200)
EOS PCT: 6 %
Eosinophils Absolute: 522 cells/uL — ABNORMAL HIGH (ref 15–500)
HEMATOCRIT: 36.1 % (ref 35.0–45.0)
Hemoglobin: 11.7 g/dL (ref 11.7–15.5)
LYMPHS PCT: 30 %
Lymphs Abs: 2610 cells/uL (ref 850–3900)
MCH: 31 pg (ref 27.0–33.0)
MCHC: 32.4 g/dL (ref 32.0–36.0)
MCV: 95.5 fL (ref 80.0–100.0)
MONO ABS: 957 {cells}/uL — AB (ref 200–950)
MONOS PCT: 11 %
MPV: 9.1 fL (ref 7.5–12.5)
NEUTROS PCT: 53 %
Neutro Abs: 4611 cells/uL (ref 1500–7800)
PLATELETS: 206 10*3/uL (ref 140–400)
RBC: 3.78 MIL/uL — AB (ref 3.80–5.10)
RDW: 14.8 % (ref 11.0–15.0)
WBC: 8.7 10*3/uL (ref 3.8–10.8)

## 2016-02-04 LAB — BASIC METABOLIC PANEL
BUN: 22 mg/dL (ref 7–25)
CALCIUM: 9.3 mg/dL (ref 8.6–10.4)
CO2: 32 mmol/L — ABNORMAL HIGH (ref 20–31)
CREATININE: 1.39 mg/dL — AB (ref 0.60–0.88)
Chloride: 104 mmol/L (ref 98–110)
GLUCOSE: 133 mg/dL — AB (ref 65–99)
Potassium: 4 mmol/L (ref 3.5–5.3)
Sodium: 144 mmol/L (ref 135–146)

## 2016-02-04 LAB — MAGNESIUM: MAGNESIUM: 2.4 mg/dL (ref 1.5–2.5)

## 2016-02-04 LAB — URIC ACID: Uric Acid, Serum: 9.9 mg/dL — ABNORMAL HIGH (ref 2.5–7.0)

## 2016-02-04 MED ORDER — TORSEMIDE 20 MG PO TABS: 20.0000 mg | ORAL_TABLET | Freq: Two times a day (BID) | ORAL | 1 refills | Status: DC

## 2016-02-04 NOTE — Patient Instructions (Addendum)
Medication Instructions:  Stop lasix (furosemide).  Start torsemide 20mg  two times a day.  Labwork: BMET/CBCd/uric acid/Magnesium level today  Testing/Procedures: none  Follow-Up: Your physician recommends that you schedule a follow-up appointment in: about 2 weeks with Richardson Dopp, PA.        If you need a refill on your cardiac medications before your next appointment, please call your pharmacy.

## 2016-02-04 NOTE — Progress Notes (Signed)
Cardiology Office Note   Date:  02/04/2016   ID:  Sol, Odor Nov 09, 1930, MRN [id number]  PCP:  No PCP Per Patient  Cardiologist:   Dorris Carnes, MD   F/U of diastolic CHF     History of Present Illness: Brandi Melton is a 82 y.o. female with a history of chronic diastolic CHF, HTN, HL, sleep apnea, CKD, rheumatoid arthritis. She lives at [facility] in the independent leaving facility.  She was admitted in 3/17 and again in 3/26 with a/c diastolic CHF.  Diuresis has been augmented with Metolazone in the past.  She has also eaten a high salt diet in the past.  When she was DC on 11/16/15, her weight was 238 lbs.  She has been seen by Kathleen Argue in the interval and diuretics adjusted    Since she was last in clinic she says her fluid remains high  Wt 250  Better when  238  Says that she is watching salt   ALso had problems with gout   On allopurinol now  Doing better   Watching meat    Outpatient Medications Prior to Visit  Medication Sig Dispense Refill  . aspirin EC 81 MG tablet Take 81 mg by mouth daily.    . calcium carbonate (TUMS EX) 750 MG chewable tablet Chew 1 tablet by mouth daily.    . carvedilol (COREG CR) 80 MG 24 hr capsule Take 1 capsule (80 mg total) by mouth daily. 90 capsule 3  . chlorhexidine (PERIDEX) 0.12 % solution Use as directed 5 mLs in the mouth or throat 2 (two) times daily.     . cholecalciferol (VITAMIN D) 1000 units tablet Take 3,000 Units by mouth daily.    Marland Kitchen ezetimibe-simvastatin (VYTORIN) 10-40 MG per tablet Take 1 tablet by mouth daily.     . folic acid (FOLVITE) 712 MCG tablet Take 400 mcg by mouth daily.    . furosemide (LASIX) 80 MG tablet Take 1 tablet (80 mg total) by mouth 2 (two) times daily. 60 tablet 0  . insulin aspart (NOVOLOG) 100 UNIT/ML injection Inject 30 Units into the skin 4 (four) times daily.    . Insulin Detemir (LEVEMIR) 100 UNIT/ML Pen Inject 80 Units into the skin 2 (two) times daily.     . magnesium oxide (MAG-OX) 400 MG tablet  Take 400 mg by mouth daily.    . metolazone (ZAROXOLYN) 2.5 MG tablet Take 1 tablet once a week on every Thursday 30 minutes before your morning Lasix. 90 tablet 3  . potassium chloride SA (K-DUR,KLOR-CON) 20 MEQ tablet Take 20 mEq by mouth daily.    Marland Kitchen telmisartan (MICARDIS) 20 MG tablet Take 20 mg by mouth daily.    . Vitamin D, Ergocalciferol, (DRISDOL) 50000 UNITS CAPS capsule Take 50,000 Units by mouth every Wednesday.      No facility-administered medications prior to visit.      Allergies:   Review of patient's allergies indicates no known allergies.   Past Medical History:  Diagnosis Date  . Arthritis    "hips before they were replaced, right knee now" (11/09/2015)  . Chronic diastolic (congestive) heart failure    a. echo 06/2015: EF 65-70%, Grade 1 DD, mitral valve calcification.  . CKD (chronic kidney disease), stage III   . Depression   . H/O cardiac catheterization    a. h/o cardiac catheterization 05/2013 in [place] showing only minimal CAD with increased filling pressure.  . Hyperlipemia   . Hypertensive heart  disease   . Morbid obesity ([facility])   . Palsy, Bell's   . RA (rheumatoid arthritis) ([facility])   . Sleep apnea    "suppose to wear mask; couldn't" (11/09/2015)  . Type II diabetes mellitus ([facility])     Past Surgical History:  Procedure Laterality Date  . APPENDECTOMY  1966  . CARDIAC CATHETERIZATION  05/2013  . CARPAL TUNNEL RELEASE Right 08/08/2014   Procedure: OPEN CARPAL TUNNEL RELEASE;  Surgeon: Roseanne Kaufman, MD;  Location: [facility];  Service: Orthopedics;  Laterality: Right;  . CATARACT EXTRACTION W/ INTRAOCULAR LENS  IMPLANT, BILATERAL Bilateral   . CHOLECYSTECTOMY OPEN  1966  . COLONOSCOPY    . FRACTURE SURGERY    . JOINT REPLACEMENT    . KNEE ARTHROPLASTY Left   . ORIF WRIST FRACTURE Right 08/08/2014   Procedure: Right OPEN REDUCTION INTERNAL FIXATION (ORIF) WRIST FRACTURE WITH REPAIR AND RECONST/ALLOGRAFT AND BONE GRAFT;  Surgeon: Roseanne Kaufman, MD;   Location: [facility];  Service: Orthopedics;  Laterality: Right;  . TOTAL HIP ARTHROPLASTY Bilateral      Social History:  The patient  reports that she has never smoked. She has never used smokeless tobacco. She reports that she drinks alcohol. She reports that she does not use drugs.   Family History:  The patient's family history includes Stroke in her mother; Sudden death in her mother.    ROS:  Please see the history of present illness. All other systems are reviewed and  Negative to the above problem except as noted.    PHYSICAL EXAM: VS:  BP (!) 130/56   Pulse 73   Ht 5\' 3"  (1.6 m)   Wt 250 lb 1.9 oz (113.5 kg)   SpO2 92%   BMI 44.31 kg/m   GEN: Well nourished, well developed, in no acute distress HEENT: normal Neck: JVP is diffiuclt to assess  , carotid bruits, or masses Cardiac: RRR; no murmurs, rubs, or gallops,1 to 2+ edema  Respiratory:  clear to auscultation bilaterally, normal work of breathing GI: soft, nontender, nondistended, + BS  No hepatomegaly  MS: no deformity Moving all extremities   Skin: warm and dry, no rash Neuro:  Strength and sensation are intact Psych: euthymic mood, full affect   EKG:  EKG is not ordered today.   Lipid Panel No results found for: CHOL, TRIG, HDL, CHOLHDL, VLDL, LDLCALC, LDLDIRECT    Wt Readings from Last 3 Encounters:  02/04/16 250 lb 1.9 oz (113.5 kg)  12/18/15 245 lb 12.8 oz (111.5 kg)  12/10/15 255 lb 6.4 oz (115.8 kg)      ASSESSMENT AND PLAN:  1  Acute on chronic diastolic CHF  Volume is up on exam  Discussed salt intake  I would recomm switching to Torsemide  Has better absorption Would start at 20 mg bid  May increase to 40 depending on response  Check labs today    2  HTN  Follow     Current medicines are reviewed at length with the patient today.  The patient does not have concerns regarding medicines.  Signed, Dorris Carnes, MD  02/04/2016 9:30 AM    [facility] [address],  [place], [place]  [address] Phone: [phone]; Fax: [phone]

## 2016-02-13 DIAGNOSIS — M109 Gout, unspecified: Secondary | ICD-10-CM | POA: Diagnosis not present

## 2016-02-18 ENCOUNTER — Ambulatory Visit (INDEPENDENT_AMBULATORY_CARE_PROVIDER_SITE_OTHER): Payer: Medicare Other | Admitting: Physician Assistant

## 2016-02-18 ENCOUNTER — Encounter: Payer: Self-pay | Admitting: Physician Assistant

## 2016-02-18 ENCOUNTER — Inpatient Hospital Stay (HOSPITAL_COMMUNITY)
Admission: AD | Admit: 2016-02-18 | Discharge: 2016-02-22 | DRG: 291 | Disposition: A | Discharge status: Home / Self Care | Payer: Medicare Other | Source: Ambulatory Visit | Attending: Cardiovascular Disease | Admitting: Cardiovascular Disease

## 2016-02-18 VITALS — BP 100/58 | HR 76 | Ht 63.0 in | Wt 254.0 lb

## 2016-02-18 DIAGNOSIS — N184 Chronic kidney disease, stage 4 (severe): Secondary | ICD-10-CM | POA: Diagnosis present

## 2016-02-18 DIAGNOSIS — M1000 Idiopathic gout, unspecified site: Secondary | ICD-10-CM

## 2016-02-18 DIAGNOSIS — N183 Chronic kidney disease, stage 3 unspecified: Secondary | ICD-10-CM

## 2016-02-18 DIAGNOSIS — G4730 Sleep apnea, unspecified: Secondary | ICD-10-CM | POA: Diagnosis present

## 2016-02-18 DIAGNOSIS — J9620 Acute and chronic respiratory failure, unspecified whether with hypoxia or hypercapnia: Secondary | ICD-10-CM | POA: Diagnosis present

## 2016-02-18 DIAGNOSIS — R918 Other nonspecific abnormal finding of lung field: Secondary | ICD-10-CM | POA: Diagnosis not present

## 2016-02-18 DIAGNOSIS — Z794 Long term (current) use of insulin: Secondary | ICD-10-CM | POA: Diagnosis not present

## 2016-02-18 DIAGNOSIS — Z96643 Presence of artificial hip joint, bilateral: Secondary | ICD-10-CM | POA: Diagnosis present

## 2016-02-18 DIAGNOSIS — Z96652 Presence of left artificial knee joint: Secondary | ICD-10-CM | POA: Diagnosis present

## 2016-02-18 DIAGNOSIS — T501X5A Adverse effect of loop [high-ceiling] diuretics, initial encounter: Secondary | ICD-10-CM | POA: Diagnosis present

## 2016-02-18 DIAGNOSIS — E119 Type 2 diabetes mellitus without complications: Secondary | ICD-10-CM

## 2016-02-18 DIAGNOSIS — I5033 Acute on chronic diastolic (congestive) heart failure: Secondary | ICD-10-CM

## 2016-02-18 DIAGNOSIS — M19072 Primary osteoarthritis, left ankle and foot: Secondary | ICD-10-CM | POA: Diagnosis not present

## 2016-02-18 DIAGNOSIS — E662 Morbid (severe) obesity with alveolar hypoventilation: Secondary | ICD-10-CM | POA: Diagnosis present

## 2016-02-18 DIAGNOSIS — E11649 Type 2 diabetes mellitus with hypoglycemia without coma: Secondary | ICD-10-CM | POA: Diagnosis present

## 2016-02-18 DIAGNOSIS — M10072 Idiopathic gout, left ankle and foot: Secondary | ICD-10-CM | POA: Diagnosis present

## 2016-02-18 DIAGNOSIS — F40240 Claustrophobia: Secondary | ICD-10-CM | POA: Diagnosis present

## 2016-02-18 DIAGNOSIS — I10 Essential (primary) hypertension: Secondary | ICD-10-CM | POA: Diagnosis not present

## 2016-02-18 DIAGNOSIS — Z6841 Body Mass Index (BMI) 40.0 and over, adult: Secondary | ICD-10-CM | POA: Diagnosis not present

## 2016-02-18 DIAGNOSIS — Z9119 Patient's noncompliance with other medical treatment and regimen: Secondary | ICD-10-CM

## 2016-02-18 DIAGNOSIS — I509 Heart failure, unspecified: Secondary | ICD-10-CM

## 2016-02-18 DIAGNOSIS — G4733 Obstructive sleep apnea (adult) (pediatric): Secondary | ICD-10-CM | POA: Diagnosis present

## 2016-02-18 DIAGNOSIS — I5032 Chronic diastolic (congestive) heart failure: Secondary | ICD-10-CM

## 2016-02-18 DIAGNOSIS — E785 Hyperlipidemia, unspecified: Secondary | ICD-10-CM | POA: Diagnosis present

## 2016-02-18 DIAGNOSIS — I130 Hypertensive heart and chronic kidney disease with heart failure and stage 1 through stage 4 chronic kidney disease, or unspecified chronic kidney disease: Secondary | ICD-10-CM | POA: Diagnosis present

## 2016-02-18 DIAGNOSIS — Z9111 Patient's noncompliance with dietary regimen: Secondary | ICD-10-CM

## 2016-02-18 DIAGNOSIS — R0600 Dyspnea, unspecified: Secondary | ICD-10-CM | POA: Diagnosis not present

## 2016-02-18 DIAGNOSIS — M069 Rheumatoid arthritis, unspecified: Secondary | ICD-10-CM | POA: Diagnosis present

## 2016-02-18 DIAGNOSIS — Z79899 Other long term (current) drug therapy: Secondary | ICD-10-CM | POA: Diagnosis not present

## 2016-02-18 DIAGNOSIS — M79672 Pain in left foot: Secondary | ICD-10-CM

## 2016-02-18 DIAGNOSIS — E1122 Type 2 diabetes mellitus with diabetic chronic kidney disease: Secondary | ICD-10-CM | POA: Diagnosis present

## 2016-02-18 DIAGNOSIS — Z7982 Long term (current) use of aspirin: Secondary | ICD-10-CM | POA: Diagnosis not present

## 2016-02-18 DIAGNOSIS — M109 Gout, unspecified: Secondary | ICD-10-CM | POA: Diagnosis present

## 2016-02-18 LAB — CBC WITH DIFFERENTIAL/PLATELET
BASOS ABS: 0 10*3/uL (ref 0.0–0.1)
Basophils Relative: 0 %
EOS PCT: 4 %
Eosinophils Absolute: 0.5 10*3/uL (ref 0.0–0.7)
HEMATOCRIT: 36.3 % (ref 36.0–46.0)
Hemoglobin: 11.3 g/dL — ABNORMAL LOW (ref 12.0–15.0)
LYMPHS ABS: 2.7 10*3/uL (ref 0.7–4.0)
LYMPHS PCT: 24 %
MCH: 30.8 pg (ref 26.0–34.0)
MCHC: 31.1 g/dL (ref 30.0–36.0)
MCV: 98.9 fL (ref 78.0–100.0)
MONO ABS: 1.2 10*3/uL — AB (ref 0.1–1.0)
Monocytes Relative: 11 %
NEUTROS ABS: 6.9 10*3/uL (ref 1.7–7.7)
Neutrophils Relative %: 61 %
PLATELETS: 225 10*3/uL (ref 150–400)
RBC: 3.67 MIL/uL — AB (ref 3.87–5.11)
RDW: 15.6 % — ABNORMAL HIGH (ref 11.5–15.5)
WBC: 11.4 10*3/uL — AB (ref 4.0–10.5)

## 2016-02-18 LAB — COMPREHENSIVE METABOLIC PANEL
ALBUMIN: 3.2 g/dL — AB (ref 3.5–5.0)
ALT: 18 U/L (ref 14–54)
ANION GAP: 9 (ref 5–15)
AST: 19 U/L (ref 15–41)
Alkaline Phosphatase: 80 U/L (ref 38–126)
BUN: 20 mg/dL (ref 6–20)
CHLORIDE: 102 mmol/L (ref 101–111)
CO2: 29 mmol/L (ref 22–32)
Calcium: 9.2 mg/dL (ref 8.9–10.3)
Creatinine, Ser: 1.44 mg/dL — ABNORMAL HIGH (ref 0.44–1.00)
GFR calc Af Amer: 38 mL/min — ABNORMAL LOW (ref >60)
GFR calc non Af Amer: 33 mL/min — ABNORMAL LOW (ref >60)
GLUCOSE: 90 mg/dL (ref 65–99)
POTASSIUM: 3.9 mmol/L (ref 3.5–5.1)
Sodium: 140 mmol/L (ref 135–145)
Total Bilirubin: 0.6 mg/dL (ref 0.3–1.2)
Total Protein: 6.7 g/dL (ref 6.5–8.1)

## 2016-02-18 LAB — GLUCOSE, CAPILLARY
GLUCOSE-CAPILLARY: 87 mg/dL (ref 65–99)
GLUCOSE-CAPILLARY: 150 mg/dL — AB (ref 65–99)

## 2016-02-18 LAB — PROTIME-INR
INR: 1.08
Prothrombin Time: 14 seconds (ref 11.4–15.2)

## 2016-02-18 LAB — BRAIN NATRIURETIC PEPTIDE: B Natriuretic Peptide: 99.9 pg/mL (ref 0.0–100.0)

## 2016-02-18 LAB — APTT: APTT: 27 s (ref 24–36)

## 2016-02-18 MED ORDER — SODIUM CHLORIDE 0.9% FLUSH
3.0000 mL | Freq: Two times a day (BID) | INTRAVENOUS | Status: DC
  Administered 2016-02-18 – 2016-02-22 (×9): 3 mL via INTRAVENOUS

## 2016-02-18 MED ORDER — INSULIN DETEMIR 100 UNIT/ML ~~LOC~~ SOLN
80.0000 [IU] | Freq: Two times a day (BID) | SUBCUTANEOUS | Status: DC
  Administered 2016-02-18 – 2016-02-22 (×8): 80 [IU] via SUBCUTANEOUS
  Filled 2016-02-18 (×10): qty 0.8

## 2016-02-18 MED ORDER — VITAMIN D (ERGOCALCIFEROL) 1.25 MG (50000 UNIT) PO CAPS
50000.0000 [IU] | ORAL_CAPSULE | ORAL | Status: DC
  Administered 2016-02-20: 50000 [IU] via ORAL
  Filled 2016-02-18: qty 1

## 2016-02-18 MED ORDER — FUROSEMIDE 10 MG/ML IJ SOLN
60.0000 mg | Freq: Two times a day (BID) | INTRAMUSCULAR | Status: DC
  Administered 2016-02-18 – 2016-02-20 (×5): 60 mg via INTRAVENOUS
  Filled 2016-02-18 (×6): qty 6

## 2016-02-18 MED ORDER — EZETIMIBE-SIMVASTATIN 10-40 MG PO TABS
1.0000 | ORAL_TABLET | Freq: Every day | ORAL | Status: DC
  Administered 2016-02-19 – 2016-02-22 (×4): 1 via ORAL
  Filled 2016-02-18 (×4): qty 1

## 2016-02-18 MED ORDER — ALLOPURINOL 300 MG PO TABS
300.0000 mg | ORAL_TABLET | Freq: Every day | ORAL | Status: DC
  Administered 2016-02-18 – 2016-02-22 (×5): 300 mg via ORAL
  Filled 2016-02-18 (×5): qty 1

## 2016-02-18 MED ORDER — CARVEDILOL PHOSPHATE ER 80 MG PO CP24
80.0000 mg | ORAL_CAPSULE | Freq: Every day | ORAL | Status: DC
  Administered 2016-02-19 – 2016-02-22 (×4): 80 mg via ORAL
  Filled 2016-02-18 (×4): qty 1

## 2016-02-18 MED ORDER — CALCIUM CARBONATE ANTACID 500 MG PO CHEW
1.0000 | CHEWABLE_TABLET | Freq: Every day | ORAL | Status: DC
  Administered 2016-02-19 – 2016-02-22 (×4): 200 mg via ORAL
  Filled 2016-02-18 (×4): qty 1

## 2016-02-18 MED ORDER — IRBESARTAN 75 MG PO TABS
75.0000 mg | ORAL_TABLET | Freq: Every day | ORAL | Status: DC
Start: 2016-02-19 — End: 2016-02-22
  Administered 2016-02-19 – 2016-02-22 (×4): 75 mg via ORAL
  Filled 2016-02-18 (×4): qty 1

## 2016-02-18 MED ORDER — ENOXAPARIN SODIUM 40 MG/0.4ML ~~LOC~~ SOLN
40.0000 mg | SUBCUTANEOUS | Status: DC
  Administered 2016-02-18 – 2016-02-21 (×4): 40 mg via SUBCUTANEOUS
  Filled 2016-02-18 (×4): qty 0.4

## 2016-02-18 MED ORDER — CHLORHEXIDINE GLUCONATE 0.12 % MT SOLN
5.0000 mL | Freq: Two times a day (BID) | OROMUCOSAL | Status: DC
  Administered 2016-02-18 – 2016-02-21 (×6): 5 mL via OROMUCOSAL
  Filled 2016-02-18 (×8): qty 15

## 2016-02-18 MED ORDER — POTASSIUM CHLORIDE CRYS ER 20 MEQ PO TBCR
20.0000 meq | EXTENDED_RELEASE_TABLET | Freq: Two times a day (BID) | ORAL | Status: DC
  Administered 2016-02-18 – 2016-02-20 (×6): 20 meq via ORAL
  Filled 2016-02-18 (×6): qty 1

## 2016-02-18 MED ORDER — INSULIN ASPART 100 UNIT/ML ~~LOC~~ SOLN
30.0000 [IU] | Freq: Four times a day (QID) | SUBCUTANEOUS | Status: DC
  Administered 2016-02-18: 30 [IU] via SUBCUTANEOUS

## 2016-02-18 MED ORDER — ACETAMINOPHEN 325 MG PO TABS
650.0000 mg | ORAL_TABLET | ORAL | Status: DC | PRN
  Administered 2016-02-18: 650 mg via ORAL
  Filled 2016-02-18: qty 2

## 2016-02-18 MED ORDER — SODIUM CHLORIDE 0.9 % IV SOLN: 250.0000 mL | INTRAVENOUS | Status: DC | PRN

## 2016-02-18 MED ORDER — ASPIRIN EC 81 MG PO TBEC
81.0000 mg | DELAYED_RELEASE_TABLET | Freq: Every day | ORAL | Status: DC
  Administered 2016-02-19 – 2016-02-22 (×4): 81 mg via ORAL
  Filled 2016-02-18 (×5): qty 1

## 2016-02-18 MED ORDER — SODIUM CHLORIDE 0.9% FLUSH: 3.0000 mL | INTRAVENOUS | Status: DC | PRN

## 2016-02-18 MED ORDER — ONDANSETRON HCL 4 MG/2ML IJ SOLN: 4.0000 mg | Freq: Four times a day (QID) | INTRAMUSCULAR | Status: DC | PRN

## 2016-02-18 MED ORDER — VITAMIN D 1000 UNITS PO TABS
3000.0000 [IU] | ORAL_TABLET | Freq: Every day | ORAL | Status: DC
  Administered 2016-02-19 – 2016-02-22 (×4): 3000 [IU] via ORAL
  Filled 2016-02-18 (×4): qty 3

## 2016-02-18 MED ORDER — FOLIC ACID 1 MG PO TABS
500.0000 ug | ORAL_TABLET | Freq: Every day | ORAL | Status: DC
  Administered 2016-02-19 – 2016-02-22 (×4): 0.5 mg via ORAL
  Filled 2016-02-18 (×5): qty 1

## 2016-02-18 MED ORDER — MAGNESIUM OXIDE 400 (241.3 MG) MG PO TABS
400.0000 mg | ORAL_TABLET | Freq: Every day | ORAL | Status: DC
  Administered 2016-02-19 – 2016-02-22 (×4): 400 mg via ORAL
  Filled 2016-02-18 (×4): qty 1

## 2016-02-18 NOTE — H&P (Signed)
Cardiology Admission H&P:    Date:  02/18/2016   ID:  Brandi Melton, DOB 05-17-1930, MRN [id number]  PCP:  No PCP Per Patient  Cardiologist:  Dr. Dorris Carnes   Electrophysiologist:  n/a  Referring MD: No ref. provider found   Chief Complaint  Patient presents with  . Follow-up    CHF    History of Present Illness:    Brandi Melton is a 82 y.o. female with a hx of chronic diastolic CHF, HTN, HL, sleep apnea, CKD, rheumatoid arthritis. She lives at [facility] in the independent leaving facility.  She was admitted in 3/17 and again in 3/53 with a/c diastolic CHF.  Diuresis has been augmented with Metolazone in the past.  She has also eaten a high salt diet in the past.   Last seen by Dr. Dorris Carnes 02/04/16.  She remained volume overloaded. Lasix was discontinued and she was placed on torsemide 20 mg twice a day.  She returns for close FU.   She is here alone.  She does not feel good at all.  She has a hx of gout.  She now has severe L leg pain.  She notes no change in her symptoms since she was here 2 weeks ago. Her weight is up 4 lbs.  She notes orthopnea, LE edema and dyspnea with minimal activity. She wheezes a little bit.  She denies a cough. She denies PND.  She denies chest pain or syncope.  She feels that she is doing a good job at limiting salt.  Prior CV studies that were reviewed today include:    Echo 3/17 Vigorous LVF, EF 65-70%, normal wall motion, grade 1 diastolic dysfunction, severe MAC, no regurgitation, normal RVSF  LHC 2/15 ([place], [place]) EF 65-70% LCx minimal mid disease LAD minimal disease RCA minimal disease PAP 50/16 mmHg, mean PAP 26 mmHg  Past Medical History:  Diagnosis Date  . Arthritis    "hips before they were replaced, right knee now" (11/09/2015)  . Chronic diastolic (congestive) heart failure    a. echo 06/2015: EF 65-70%, Grade 1 DD, mitral valve calcification.  . CKD (chronic kidney disease), stage III   . Depression   . H/O  cardiac catheterization    a. h/o cardiac catheterization 05/2013 in [place] showing only minimal CAD with increased filling pressure.  . Hyperlipemia   . Hypertensive heart disease   . Morbid obesity ([place])   . Palsy, Bell's   . RA (rheumatoid arthritis) ([place])   . Sleep apnea    "suppose to wear mask; couldn't" (11/09/2015)  . Type II diabetes mellitus ([place])     Past Surgical History:  Procedure Laterality Date  . APPENDECTOMY  1966  . CARDIAC CATHETERIZATION  05/2013  . CARPAL TUNNEL RELEASE Right 08/08/2014   Procedure: OPEN CARPAL TUNNEL RELEASE;  Surgeon: Roseanne Kaufman, MD;  Location: [facility];  Service: Orthopedics;  Laterality: Right;  . CATARACT EXTRACTION W/ INTRAOCULAR LENS  IMPLANT, BILATERAL Bilateral   . CHOLECYSTECTOMY OPEN  1966  . COLONOSCOPY    . FRACTURE SURGERY    . JOINT REPLACEMENT    . KNEE ARTHROPLASTY Left   . ORIF WRIST FRACTURE Right 08/08/2014   Procedure: Right OPEN REDUCTION INTERNAL FIXATION (ORIF) WRIST FRACTURE WITH REPAIR AND RECONST/ALLOGRAFT AND BONE GRAFT;  Surgeon: Roseanne Kaufman, MD;  Location: [facility];  Service: Orthopedics;  Laterality: Right;  . TOTAL HIP ARTHROPLASTY Bilateral     Current Medications: Current Meds  Medication Sig  .  allopurinol (ZYLOPRIM) 300 MG tablet Take 300 mg by mouth daily.   Marland Kitchen aspirin EC 81 MG tablet Take 81 mg by mouth daily.  . calcium carbonate (TUMS EX) 750 MG chewable tablet Chew 1 tablet by mouth daily.  . carvedilol (COREG CR) 80 MG 24 hr capsule Take 1 capsule (80 mg total) by mouth daily.  . chlorhexidine (PERIDEX) 0.12 % solution Use as directed 5 mLs in the mouth or throat 2 (two) times daily.   . cholecalciferol (VITAMIN D) 1000 units tablet Take 3,000 Units by mouth daily.  Marland Kitchen ezetimibe-simvastatin (VYTORIN) 10-40 MG per tablet Take 1 tablet by mouth daily.   . folic acid (FOLVITE) 749 MCG tablet Take 400 mcg by mouth daily.  . insulin aspart (NOVOLOG) 100 UNIT/ML injection Inject 30 Units into the skin 4  (four) times daily.  . Insulin Detemir (LEVEMIR) 100 UNIT/ML Pen Inject 80 Units into the skin 2 (two) times daily.   . magnesium oxide (MAG-OX) 400 MG tablet Take 400 mg by mouth daily.  . potassium chloride SA (K-DUR,KLOR-CON) 20 MEQ tablet Take 20 mEq by mouth daily.  Marland Kitchen telmisartan (MICARDIS) 20 MG tablet Take 20 mg by mouth daily.  Marland Kitchen torsemide (DEMADEX) 20 MG tablet Take 1 tablet (20 mg total) by mouth 2 (two) times daily.  . Vitamin D, Ergocalciferol, (DRISDOL) 50000 UNITS CAPS capsule Take 50,000 Units by mouth every Wednesday.      Allergies:   Patient has no known allergies.   Social History   Social History  . Marital status: Widowed    Spouse name: N/A  . Number of children: N/A  . Years of education: N/A   Social History Main Topics  . Smoking status: Never Smoker  . Smokeless tobacco: Never Used  . Alcohol use 0.0 oz/week     Comment: 11/09/2015 "might have glass of champaign on special occasion"  . Drug use: No  . Sexual activity: No   Other Topics Concern  . None   Social History Narrative  . None     Family History:  The patient's family history includes Stroke in her mother; Sudden death in her mother.   ROS:   Please see the history of present illness.    ROS All other systems reviewed and are negative.   EKGs/Labs/Other Test Reviewed:    EKG:  EKG is  ordered today.  The ekg ordered today demonstrates NSR, HR 76, LAD, IVCD, QTc 447 ms, no sig change since last tracing dated 11/07/15  Recent Labs: 11/07/2015: ALT 24; B Natriuretic Peptide 89.1; TSH 1.824 02/04/2016: BUN 22; Creat 1.39; Hemoglobin 11.7; Magnesium 2.4; Platelets 206; Potassium 4.0; Sodium 144   Recent Lipid Panel No results found for: CHOL, TRIG, HDL, CHOLHDL, VLDL, LDLCALC, LDLDIRECT   Physical Exam:    VS:  BP (!) 100/58   Pulse 76   Ht 5\' 3"  (1.6 m)   Wt 254 lb (115.2 kg)   SpO2 90%   BMI 44.99 kg/m     Wt Readings from Last 3 Encounters:  02/18/16 254 lb (115.2 kg)    02/04/16 250 lb 1.9 oz (113.5 kg)  12/18/15 245 lb 12.8 oz (111.5 kg)     Physical Exam  Constitutional: She is oriented to person, place, and time. She appears well-developed and well-nourished.  HENT:  Head: Normocephalic and atraumatic.  Eyes: No scleral icterus.  Neck: Neck supple.  I cannot assess JVD  Cardiovascular: Normal rate, regular rhythm and normal heart sounds.  No murmur heard. Pulmonary/Chest: She has decreased breath sounds. She has no wheezes. She has no rhonchi. She has rales in the right lower field and the left lower field.  Abdominal: She exhibits distension. There is no tenderness.  Musculoskeletal: She exhibits edema.  Trace-1+ bilat Edema - compression stockings in place  Neurological: She is alert and oriented to person, place, and time.  Skin: Skin is warm and dry.  Psychiatric: Her mood appears anxious.    ASSESSMENT:    1. Acute on chronic diastolic heart failure ([place])   2. Essential hypertension   3. CKD (chronic kidney disease), stage III    PLAN:    In order of problems listed above:  1. Acute on Chronic Diastolic CHF -  She remains volume overloaded.  She has had no response to Torsemide.  Her weight has actually increased. She feels bad and is short of breath with minimal activity.  I believe she needs admission to the hospital for IV diuresis.  She is agreeable to this. I d/w Dr. Lauree Chandler (DOD) who agreed.   -  Admit to [facility].  -  Start Lasix 60 IV bid  -  DVT Lovenox  -  EF was normal in 3/17. No need to repeat Echo  -  Consider AHF Team evaluation prior to DC. May need HF Vest assessment prior to DC.   2. HTN Heart Disease with CHF - BP is controlled.  Continue current regimen.   3. CKD -  Follow renal function closely with diuresis.  Signed, Richardson Dopp, PA-C  02/18/2016 11:04 AM    [facility] [place], [place], [place]  [address] Phone: [phone]; Fax: [phone]   I have  personally seen and examined this patient with Richardson Dopp, PA-C. She is volume overloaded on exam. She is not responding to Torsemide.  I agree with the assessment and plan as outlined above. Will admit to [facility] for diuresis with IV Lasix. She is agreeable with this plan.   Lauree Chandler 02/18/2016 1:39 PM

## 2016-02-18 NOTE — Care Management Note (Signed)
Case Management Note  Patient Details  Name: Brandi Melton MRN: [id number] Date of Birth: 06-Nov-1930  Subjective/Objective:      Adm w heart failure              Action/Plan: diurese. Lives at [facility]   Expected Discharge Date:                  Expected Discharge Plan:  [place]  In-House Referral:     Discharge planning Services     Post Acute Care Choice:    Choice offered to:     DME Arranged:    DME Agency:     HH Arranged:    [place] Agency:     Status of Service:  In process, will continue to follow  If discussed at Long Length of Stay Meetings, dates discussed:    Additional Comments:moniter for dc needs as pt progresses  Lacretia Leigh, RN 02/18/2016, 3:00 PM

## 2016-02-18 NOTE — Patient Instructions (Signed)
YOU HAVE BEEN ADMITTED TO [facility] TODAY

## 2016-02-18 NOTE — Progress Notes (Signed)
Cardiology Office Note:    Date:  02/18/2016   ID:  Brandi Melton, DOB 1931/02/02, MRN [id number]  PCP:  No PCP Per Patient  Cardiologist:  Dr. Dorris Carnes   Electrophysiologist:  n/a  Referring MD: No ref. provider found   Chief Complaint  Patient presents with  . Follow-up    CHF    History of Present Illness:    Brandi Melton is a 81 y.o. female with a hx of chronic diastolic CHF, HTN, HL, sleep apnea, CKD, rheumatoid arthritis. She lives at [facility] in the independent leaving facility.  She was admitted in 3/17 and again in 2/50 with a/c diastolic CHF.  Diuresis has been augmented with Metolazone in the past.  She has also eaten a high salt diet in the past.   Last seen by Dr. Dorris Carnes 02/04/16.  She remained volume overloaded. Lasix was discontinued and she was placed on torsemide 20 mg twice a day.  She returns for close FU.   She is here alone.  She does not feel good at all.  She has a hx of gout.  She now has severe L leg pain.  She notes no change in her symptoms since she was here 2 weeks ago. Her weight is up 4 lbs.  She notes orthopnea, LE edema and dyspnea with minimal activity. She wheezes a little bit.  She denies a cough. She denies PND.  She denies chest pain or syncope.  She feels that she is doing a good job at limiting salt.  Prior CV studies that were reviewed today include:    Echo 3/17 Vigorous LVF, EF 65-70%, normal wall motion, grade 1 diastolic dysfunction, severe MAC, no regurgitation, normal RVSF  LHC 2/15 ([place], [place]) EF 65-70% LCx minimal mid disease LAD minimal disease RCA minimal disease PAP 50/16 mmHg, mean PAP 26 mmHg  Past Medical History:  Diagnosis Date  . Arthritis    "hips before they were replaced, right knee now" (11/09/2015)  . Chronic diastolic (congestive) heart failure    a. echo 06/2015: EF 65-70%, Grade 1 DD, mitral valve calcification.  . CKD (chronic kidney disease), stage III   . Depression   . H/O  cardiac catheterization    a. h/o cardiac catheterization 05/2013 in [place] showing only minimal CAD with increased filling pressure.  . Hyperlipemia   . Hypertensive heart disease   . Morbid obesity ([place])   . Palsy, Bell's   . RA (rheumatoid arthritis) ([place] [place])   . Sleep apnea    "suppose to wear mask; couldn't" (11/09/2015)  . Type II diabetes mellitus ([place])     Past Surgical History:  Procedure Laterality Date  . APPENDECTOMY  1966  . CARDIAC CATHETERIZATION  05/2013  . CARPAL TUNNEL RELEASE Right 08/08/2014   Procedure: OPEN CARPAL TUNNEL RELEASE;  Surgeon: Roseanne Kaufman, MD;  Location: [facility];  Service: Orthopedics;  Laterality: Right;  . CATARACT EXTRACTION W/ INTRAOCULAR LENS  IMPLANT, BILATERAL Bilateral   . CHOLECYSTECTOMY OPEN  1966  . COLONOSCOPY    . FRACTURE SURGERY    . JOINT REPLACEMENT    . KNEE ARTHROPLASTY Left   . ORIF WRIST FRACTURE Right 08/08/2014   Procedure: Right OPEN REDUCTION INTERNAL FIXATION (ORIF) WRIST FRACTURE WITH REPAIR AND RECONST/ALLOGRAFT AND BONE GRAFT;  Surgeon: Roseanne Kaufman, MD;  Location: [facility];  Service: Orthopedics;  Laterality: Right;  . TOTAL HIP ARTHROPLASTY Bilateral     Current Medications: Current Meds  Medication Sig  .  allopurinol (ZYLOPRIM) 300 MG tablet Take 300 mg by mouth daily.   Marland Kitchen aspirin EC 81 MG tablet Take 81 mg by mouth daily.  . calcium carbonate (TUMS EX) 750 MG chewable tablet Chew 1 tablet by mouth daily.  . carvedilol (COREG CR) 80 MG 24 hr capsule Take 1 capsule (80 mg total) by mouth daily.  . chlorhexidine (PERIDEX) 0.12 % solution Use as directed 5 mLs in the mouth or throat 2 (two) times daily.   . cholecalciferol (VITAMIN D) 1000 units tablet Take 3,000 Units by mouth daily.  Marland Kitchen ezetimibe-simvastatin (VYTORIN) 10-40 MG per tablet Take 1 tablet by mouth daily.   . folic acid (FOLVITE) 116 MCG tablet Take 400 mcg by mouth daily.  . insulin aspart (NOVOLOG) 100 UNIT/ML injection Inject 30 Units into the skin 4  (four) times daily.  . Insulin Detemir (LEVEMIR) 100 UNIT/ML Pen Inject 80 Units into the skin 2 (two) times daily.   . magnesium oxide (MAG-OX) 400 MG tablet Take 400 mg by mouth daily.  . potassium chloride SA (K-DUR,KLOR-CON) 20 MEQ tablet Take 20 mEq by mouth daily.  Marland Kitchen telmisartan (MICARDIS) 20 MG tablet Take 20 mg by mouth daily.  Marland Kitchen torsemide (DEMADEX) 20 MG tablet Take 1 tablet (20 mg total) by mouth 2 (two) times daily.  . Vitamin D, Ergocalciferol, (DRISDOL) 50000 UNITS CAPS capsule Take 50,000 Units by mouth every Wednesday.      Allergies:   Patient has no known allergies.   Social History   Social History  . Marital status: Widowed    Spouse name: N/A  . Number of children: N/A  . Years of education: N/A   Social History Main Topics  . Smoking status: Never Smoker  . Smokeless tobacco: Never Used  . Alcohol use 0.0 oz/week     Comment: 11/09/2015 "might have glass of champaign on special occasion"  . Drug use: No  . Sexual activity: No   Other Topics Concern  . None   Social History Narrative  . None     Family History:  The patient's family history includes Stroke in her mother; Sudden death in her mother.   ROS:   Please see the history of present illness.    ROS All other systems reviewed and are negative.   EKGs/Labs/Other Test Reviewed:    EKG:  EKG is  ordered today.  The ekg ordered today demonstrates NSR, HR 76, LAD, IVCD, QTc 447 ms, no sig change since last tracing dated 11/07/15  Recent Labs: 11/07/2015: ALT 24; B Natriuretic Peptide 89.1; TSH 1.824 02/04/2016: BUN 22; Creat 1.39; Hemoglobin 11.7; Magnesium 2.4; Platelets 206; Potassium 4.0; Sodium 144   Recent Lipid Panel No results found for: CHOL, TRIG, HDL, CHOLHDL, VLDL, LDLCALC, LDLDIRECT   Physical Exam:    VS:  BP (!) 100/58   Pulse 76   Ht 5\' 3"  (1.6 m)   Wt 254 lb (115.2 kg)   SpO2 90%   BMI 44.99 kg/m     Wt Readings from Last 3 Encounters:  02/18/16 251 lb 14.4 oz (114.3  kg)  02/18/16 254 lb (115.2 kg)  02/04/16 250 lb 1.9 oz (113.5 kg)     Physical Exam  Constitutional: She is oriented to person, place, and time. She appears well-developed and well-nourished.  HENT:  Head: Normocephalic and atraumatic.  Eyes: No scleral icterus.  Neck: Neck supple.  I cannot assess JVD  Cardiovascular: Normal rate, regular rhythm and normal heart sounds.   No  murmur heard. Pulmonary/Chest: She has decreased breath sounds. She has no wheezes. She has no rhonchi. She has rales in the right lower field and the left lower field.  Abdominal: She exhibits distension. There is no tenderness.  Musculoskeletal: She exhibits edema.  Trace-1+ bilat Edema - compression stockings in place  Neurological: She is alert and oriented to person, place, and time.  Skin: Skin is warm and dry.  Psychiatric: Her mood appears anxious.    ASSESSMENT:    1. Acute on chronic diastolic heart failure ([place])   2. Essential hypertension   3. CKD (chronic kidney disease), stage III    PLAN:    In order of problems listed above:  1. Acute on Chronic Diastolic CHF -  She remains volume overloaded.  She has had no response to Torsemide.  Her weight has actually increased. She feels bad and is short of breath with minimal activity.  I believe she needs admission to the hospital for IV diuresis.  She is agreeable to this. I d/w Dr. Lauree Chandler (DOD) who agreed.   -  Admit to [facility].  -  Start Lasix 60 IV bid  -  DVT Lovenox  -  EF was normal in 3/17. No need to repeat Echo  -  Consider AHF Team evaluation prior to DC. May need HF Vest assessment prior to DC.   2. HTN Heart Disease with CHF - BP is controlled.  Continue current regimen.   3. CKD -  Follow renal function closely with diuresis.   Medication Adjustments/Labs and Tests Ordered: Current medicines are reviewed at length with the patient today.  Concerns regarding medicines are outlined above.  Medication changes,  Labs and Tests ordered today are outlined in the Patient Instructions noted below. Patient Instructions  YOU HAVE BEEN ADMITTED TO  TODAY  Signed, Richardson Dopp, PA-C  02/18/2016 4:50 PM    [facility] [place], [place], [place]  [address] Phone: [phone]; Fax: [phone]

## 2016-02-19 ENCOUNTER — Inpatient Hospital Stay (HOSPITAL_COMMUNITY): Payer: Medicare Other

## 2016-02-19 DIAGNOSIS — M1000 Idiopathic gout, unspecified site: Secondary | ICD-10-CM

## 2016-02-19 DIAGNOSIS — E785 Hyperlipidemia, unspecified: Secondary | ICD-10-CM

## 2016-02-19 DIAGNOSIS — I10 Essential (primary) hypertension: Secondary | ICD-10-CM

## 2016-02-19 DIAGNOSIS — M109 Gout, unspecified: Secondary | ICD-10-CM | POA: Diagnosis present

## 2016-02-19 LAB — GLUCOSE, CAPILLARY
GLUCOSE-CAPILLARY: 155 mg/dL — AB (ref 65–99)
Glucose-Capillary: 57 mg/dL — ABNORMAL LOW (ref 65–99)
Glucose-Capillary: 92 mg/dL (ref 65–99)
Glucose-Capillary: 129 mg/dL — ABNORMAL HIGH (ref 65–99)
Glucose-Capillary: 154 mg/dL — ABNORMAL HIGH (ref 65–99)

## 2016-02-19 LAB — BASIC METABOLIC PANEL
Anion gap: 10 (ref 5–15)
BUN: 20 mg/dL (ref 6–20)
CHLORIDE: 104 mmol/L (ref 101–111)
CO2: 30 mmol/L (ref 22–32)
CREATININE: 1.38 mg/dL — AB (ref 0.44–1.00)
Calcium: 8.9 mg/dL (ref 8.9–10.3)
GFR calc Af Amer: 40 mL/min — ABNORMAL LOW (ref >60)
GFR calc non Af Amer: 34 mL/min — ABNORMAL LOW (ref >60)
Glucose, Bld: 51 mg/dL — ABNORMAL LOW (ref 65–99)
Potassium: 3.6 mmol/L (ref 3.5–5.1)
Sodium: 144 mmol/L (ref 135–145)

## 2016-02-19 LAB — SEDIMENTATION RATE: SED RATE: 92 mm/h — AB (ref 0–22)

## 2016-02-19 LAB — URIC ACID: URIC ACID, SERUM: 7 mg/dL — AB (ref 2.3–6.6)

## 2016-02-19 MED ORDER — INSULIN ASPART 100 UNIT/ML ~~LOC~~ SOLN: 0.0000 [IU] | Freq: Every day | SUBCUTANEOUS | Status: DC

## 2016-02-19 MED ORDER — INSULIN ASPART 100 UNIT/ML ~~LOC~~ SOLN
0.0000 [IU] | Freq: Three times a day (TID) | SUBCUTANEOUS | Status: DC
Start: 2016-02-19 — End: 2016-02-20
  Administered 2016-02-19 – 2016-02-20 (×2): 2 [IU] via SUBCUTANEOUS
  Administered 2016-02-20: 5 [IU] via SUBCUTANEOUS

## 2016-02-19 MED ORDER — PREDNISONE 50 MG PO TABS: 50.0000 mg | ORAL_TABLET | Freq: Every day | ORAL | Status: DC

## 2016-02-19 MED ORDER — PREDNISONE 50 MG PO TABS
50.0000 mg | ORAL_TABLET | Freq: Every day | ORAL | Status: AC
  Administered 2016-02-19 – 2016-02-22 (×4): 50 mg via ORAL
  Filled 2016-02-19 (×4): qty 1

## 2016-02-19 MED ORDER — INSULIN ASPART 100 UNIT/ML ~~LOC~~ SOLN
20.0000 [IU] | Freq: Three times a day (TID) | SUBCUTANEOUS | Status: DC
  Administered 2016-02-20 (×2): 20 [IU] via SUBCUTANEOUS

## 2016-02-19 MED ORDER — OXYCODONE HCL 5 MG PO TABS
5.0000 mg | ORAL_TABLET | ORAL | Status: DC | PRN
  Filled 2016-02-19 (×2): qty 1

## 2016-02-19 NOTE — Progress Notes (Signed)
Nutrition Education Note  RD consulted for nutrition education regarding pt with recurrent heart failure  RD familiar with patient from previous admission at which time pt demonstrated understanding of low sodium diet and reported that food at ALF was salted and high in sodium. RD had recommended getting rid of soup and/or salad dressing at ALF dinner, but pt stated that she would not have enough food if she did that.  Today, pt reports that she has continued to follow low sodium diet at home and has eliminated soup from her ALF dinners and has replaced it with applesauce. Pt became very emotional at time of visit and states she felt very discouraged. RD encouraged patient to continue following her nutrition plan. Pt states that she feels like she can't eat anything anymore, there are so many foods she can't eat. RD provided "Heart Failure Nutrition Therapy" and "Low Sodium Food List" handouts from the Academy of [employer]etics. Encouraged pt to focus on the low sodium foods that she CAN have.   Expect good compliance.  Body mass index is 44.39 kg/m. Pt meets criteria for Morbid Obesity based on current BMI.  Current diet order is Heart healthy/Carb Modified, patient is consuming approximately 25% of meals at this time. Pt states that her appetite is good, but she is feeling discouraged and could stop eating altogether. RD encouraged patient to continue eating adequate healthy meals. She denies any additional needs at this time. Labs and medications reviewed. No further nutrition interventions warranted at this time. RD contact information provided. If additional nutrition issues arise, please re-consult RD.   Scarlette Ar RD, CSP, LDN Inpatient Clinical Dietitian Pager: [phone] After Hours Pager: [phone]

## 2016-02-19 NOTE — Progress Notes (Signed)
Patient Name: Brandi Melton Date of Encounter: 02/19/2016  Primary Cardiologist: Elizebeth Brooking Problem List     Active Problems:   Acute on chronic diastolic CHF (congestive heart failure) (HCC)     Subjective   Major complaint is pain in her left lower extremity including the left great toe. She feels this is related to gout.  States he still feels swollen and short of breath. Recently furosemide was switched to torsemide but without significant diuresis. This led to admission from office yesterday.   Inpatient Medications    Scheduled Meds: . allopurinol  300 mg Oral Daily  . aspirin EC  81 mg Oral Daily  . calcium carbonate  1 tablet Oral Daily  . carvedilol  80 mg Oral Daily  . chlorhexidine  5 mL Mouth/Throat BID  . cholecalciferol  3,000 Units Oral Daily  . enoxaparin (LOVENOX) injection  40 mg Subcutaneous Q24H  . ezetimibe-simvastatin  1 tablet Oral Daily  . folic acid  601 mcg Oral Daily  . furosemide  60 mg Intravenous BID  . insulin aspart  30 Units Subcutaneous QID  . insulin detemir  80 Units Subcutaneous BID  . irbesartan  75 mg Oral Daily  . magnesium oxide  400 mg Oral Daily  . potassium chloride  20 mEq Oral BID  . sodium chloride flush  3 mL Intravenous Q12H  . [START ON 02/20/2016] Vitamin D (Ergocalciferol)  50,000 Units Oral Q Wed   Continuous Infusions:  PRN Meds: sodium chloride, acetaminophen, ondansetron (ZOFRAN) IV, sodium chloride flush   Vital Signs    Vitals:   02/18/16 1956 02/19/16 0019 02/19/16 0520 02/19/16 0800  BP: (!) 107/55 (!) 120/50 (!) 132/59 (!) 135/54  Pulse: 86 77 78 80  Resp: 20 20 20 18   Temp: 98.3 F (36.8 C) 99 F (37.2 C) 98.2 F (36.8 C) 98.6 F (37 C)  TempSrc: Oral Oral Oral Oral  SpO2: 96% 95% 94% 92%  Weight:   250 lb 9.6 oz (113.7 kg)   Height:        Intake/Output Summary (Last 24 hours) at 02/19/16 1028 Last data filed at 02/19/16 1012  Gross per 24 hour  Intake              363 ml  Output              2650 ml  Net            -2287 ml   Filed Weights   02/18/16 1516 02/19/16 0520  Weight: 251 lb 14.4 oz (114.3 kg) 250 lb 9.6 oz (113.7 kg)    Physical Exam    GEN: Well nourished, Obese, in acute distress related to leg pain. HEENT: Grossly normal.  Neck: Supple, no JVD, carotid bruits, or masses. Cardiac: RRR, no murmurs, rubs, or gallops. No clubbing, cyanosis, edema.  Radials/DP/PT 2+ and equal bilaterally.  Respiratory:  Respirations regular and unlabored, clear to auscultation bilaterally. GI: Soft, nontender, nondistended, BS + x 4. MS: no deformity or atrophy. Skin: warm and dry, no rash. Neuro:  Strength and sensation are intact. Psych: AAOx3.  Normal affect.  Labs    CBC  Recent Labs  02/18/16 1632  WBC 11.4*  NEUTROABS 6.9  HGB 11.3*  HCT 36.3  MCV 98.9  PLT 093   Basic Metabolic Panel  Recent Labs  02/18/16 1632 02/19/16 0311  NA 140 144  K 3.9 3.6  CL 102 104  CO2 29 30  GLUCOSE 90 51*  BUN 20 20  CREATININE 1.44* 1.38*  CALCIUM 9.2 8.9   Liver Function Tests  Recent Labs  02/18/16 1632  AST 19  ALT 18  ALKPHOS 80  BILITOT 0.6  PROT 6.7  ALBUMIN 3.2*   No results for input(s): LIPASE, AMYLASE in the last 72 hours. Cardiac Enzymes No results for input(s): CKTOTAL, CKMB, CKMBINDEX, TROPONINI in the last 72 hours. BNP Invalid input(s): POCBNP D-Dimer No results for input(s): DDIMER in the last 72 hours. Hemoglobin A1C No results for input(s): HGBA1C in the last 72 hours. Fasting Lipid Panel No results for input(s): CHOL, HDL, LDLCALC, TRIG, CHOLHDL, LDLDIRECT in the last 72 hours. Thyroid Function Tests No results for input(s): TSH, T4TOTAL, T3FREE, THYROIDAB in the last 72 hours.  Invalid input(s): FREET3  Telemetry    Normal sinus rhythm - Personally Reviewed  ECG    Left anterior hemiblock, normal sinus rhythm. EKG tracing 02/18/16 - Personally Reviewed  Radiology    Dg Chest 2 View  Result Date:  02/19/2016 CLINICAL DATA:  82 year old female with a 2 day history of shortness of breath. Past medical history includes CHF. EXAM: CHEST  2 VIEW COMPARISON:  Prior chest x-ray 11/08/2015 FINDINGS: Slightly increased pulmonary vascular congestion without overt pulmonary edema. No pleural effusion, focal airspace consolidation or pneumothorax. Lower inspiratory volumes with slightly increased bibasilar subsegmental atelectasis. Stable cardiac and mediastinal contours. Atherosclerotic calcifications present in the transverse aorta. IMPRESSION: 1. Slightly increased pulmonary vascular congestion without overt edema. 2. Lower inspiratory volumes with mild bibasilar subsegmental atelectasis. 3.  Aortic Atherosclerosis (ICD10-170.0). Electronically Signed   By: Jacqulynn Cadet M.D.   On: 02/19/2016 09:44    Cardiac Studies   Cardiac catheterization 2015, performed in [place], revealed minimal nonobstructive coronary disease.  Echocardiogram 07/03/15: ------------------------------------------------------------------- Study Conclusions  - Left ventricle: The cavity size was normal. Systolic function was   vigorous. The estimated ejection fraction was in the range of 65%   to 70%. Wall motion was normal; there were no regional wall   motion abnormalities. Doppler parameters are consistent with   abnormal left ventricular relaxation (grade 1 diastolic   dysfunction). Doppler parameters are consistent with elevated   ventricular end-diastolic filling pressure. - Aortic valve: Trileaflet; normal thickness leaflets.   Transvalvular velocity was within the normal range. There was no   stenosis. There was no regurgitation. - Aortic root: The aortic root was normal in size. - Mitral valve: Severe mitral annular calcifications, predominantly   posterior. Calcified annulus. Mildly thickened leaflets . There   was no regurgitation. - Right ventricle: The cavity size was normal. Wall thickness was   normal.  Systolic function was normal. - Tricuspid valve: There was no regurgitation. - Pulmonary arteries: Systolic pressure was within the normal   range.  Patient Profile     82 year old with chronic diastolic heart failure who presents with acute on chronic volume overload, history of 2diabetes, nonobstructive CAD by cath 2015, essential hypertension, hyperlipidemia, sleep apnea, and history of both rheumatoid arthritis and gout. Admitted from our office on 02/18/16 because of a failure to diurese with up titration of diuretic regimen over the preceding 3 weeks. Her major complaint on admission however was left leg pain that she feels is related to gout. Known chronic kidney disease.  Assessment & Plan    1. Acute on chronic diastolic heart failure with volume overload and weight nearly 15 pounds above her previously established dry weight of 238 pounds. We'll give aggressive IV diuresis  with furosemide. May need to add metolazone that she uses this intermittently as an outpatient. 3 weeks ago she was switched from furosemide 80 mg twice a day to torsemide 20 mg twice a day. This may actually be a reduction in effective diuretic dose and hence may be contributing to persistent/worsening volume overload. Since she has had multiple hospitalizations, we will need to get the advanced heart failure team to give recommendations. 2. Suspected acute gout involving left lower extremity, likely precipitated by diuresis. Plan internal medicine consult to help with management. 3. Chronic kidney disease needs to be watched closely with diuresis.  Signed, Sinclair Grooms, MD  02/19/2016, 10:28 AM

## 2016-02-19 NOTE — Progress Notes (Signed)
Inpatient Diabetes Program Recommendations  AACE/ADA: New Consensus Statement on Inpatient Glycemic Control (2015)  Target Ranges:  Prepandial:   less than 140 mg/dL      Peak postprandial:   less than 180 mg/dL (1-2 hours)      Critically ill patients:  140 - 180 mg/dL   Lab Results  Component Value Date   GLUCAP 92 02/19/2016   HGBA1C 8.5 (H) 07/02/2015    Review of Glycemic Control Results for Brandi Melton, Brandi Melton (MRN [id number]) as of 02/19/2016 12:28  Ref. Range 11/16/2015 05:58 11/16/2015 11:14 02/18/2016 16:50 02/18/2016 21:35 02/19/2016 05:51 02/19/2016 06:43  Glucose-Capillary Latest Ref Range: 65 - 99 mg/dL 169 (H) 263 (H) 87 150 (H) 57 (L) 92   Diabetes history: DM2 Outpatient Diabetes medications: Levemir 80 units bid +Novolog 35 units ac tid & hs Current orders for Inpatient glycemic control: Levemir 80 units bid+Novolog 20 units ac tid + Novolog correction 0-9 units tid+0-5 units hs  Inpatient Diabetes Program Recommendations:  Spoke with patient to clarify what she is taking @ home and home medication list is correct. Patient states she likes to eat ice cream after dinner and @ bedtime so normally needs the Novolog as prescribed. While in the hospital, please consider: -Decrease of Levemir to 60 units bid  Thank you, Nani Gasser. Keiran Sias, RN, MSN, CDE Inpatient Glycemic Control Team Team Pager [phone] (8am-5pm) 02/19/2016 12:31 PM

## 2016-02-19 NOTE — Progress Notes (Signed)
Heart Failure Navigator Consult Note  Presentation: per H & P Brandi Melton is a 82 y.o. female with a hx of chronic diastolic CHF, HTN, HL, sleep apnea, CKD, rheumatoid arthritis. She lives at [place] in the independent leaving facility. She was admitted in 3/17 and again in 2/59 with a/c diastolic CHF. Diuresis has been augmented with Metolazone in the past. She has also eaten a high salt diet in the past.   Last seen by Dr. Dorris Carnes 02/04/16.  She remained volume overloaded. Lasix was discontinued and she was placed on torsemide 20 mg twice a day.  She returns for close FU.   She is here alone.  She does not feel good at all.  She has a hx of gout.  She now has severe L leg pain.  She notes no change in her symptoms since she was here 2 weeks ago. Her weight is up 4 lbs.  She notes orthopnea, LE edema and dyspnea with minimal activity. She wheezes a little bit.  She denies a cough. She denies PND.  She denies chest pain or syncope.  She feels that she is doing a good job at limiting salt.  Past Medical History:  Diagnosis Date  . Arthritis    "hips before they were replaced, right knee now" (11/09/2015)  . Chronic diastolic (congestive) heart failure    a. echo 06/2015: EF 65-70%, Grade 1 DD, mitral valve calcification.  . CKD (chronic kidney disease), stage III   . Depression   . H/O cardiac catheterization    a. h/o cardiac catheterization 05/2013 in [place] showing only minimal CAD with increased filling pressure.  . Hyperlipemia   . Hypertensive heart disease   . Morbid obesity ([place])   . Palsy, Bell's   . RA (rheumatoid arthritis) ([place])   . Sleep apnea    "suppose to wear mask; couldn't" (11/09/2015)  . Type II diabetes mellitus ([place])     Social History   Social History  . Marital status: Widowed    Spouse name: N/A  . Number of children: N/A  . Years of education: N/A   Social History Main Topics  . Smoking status: Never Smoker  . Smokeless tobacco: Never Used  .  Alcohol use 0.0 oz/week     Comment: 11/09/2015 "might have glass of champaign on special occasion"  . Drug use: No  . Sexual activity: No   Other Topics Concern  . Not on file   Social History Narrative  . No narrative on file    ECHO:Study Conclusions--07/03/15  - Left ventricle: The cavity size was normal. Systolic function was   vigorous. The estimated ejection fraction was in the range of 65%   to 70%. Wall motion was normal; there were no regional wall   motion abnormalities. Doppler parameters are consistent with   abnormal left ventricular relaxation (grade 1 diastolic   dysfunction). Doppler parameters are consistent with elevated   ventricular end-diastolic filling pressure. - Aortic valve: Trileaflet; normal thickness leaflets.   Transvalvular velocity was within the normal range. There was no   stenosis. There was no regurgitation. - Aortic root: The aortic root was normal in size. - Mitral valve: Severe mitral annular calcifications, predominantly   posterior. Calcified annulus. Mildly thickened leaflets . There   was no regurgitation. - Right ventricle: The cavity size was normal. Wall thickness was   normal. Systolic function was normal. - Tricuspid valve: There was no regurgitation. - Pulmonary arteries: Systolic pressure was within  the normal   range.  Transthoracic echocardiography.  M-mode, complete 2D, spectral Doppler, and color Doppler.  Birthdate:  Patient birthdate: 1930-10-18.  Age:  Patient is 82 yr old.  Sex:  Gender: female. BMI: 41.7 kg/m^2.  Blood pressure:     98/66  Patient status: Inpatient.  Study date:  Study date: 07/03/2015. Study time: 02:14 PM.  Location:  Bedside.  BNP    Component Value Date/Time   BNP 99.9 02/18/2016 1632   BNP 45.6 10/01/2015 0845    ProBNP No results found for: PROBNP   Education Assessment and Provision:  Detailed education and instructions provided on heart failure disease management including the  following:  Signs and symptoms of Heart Failure When to call the physician Importance of daily weights Low sodium diet Fluid restriction Medication management Anticipated future follow-up appointments  Patient education given on each of the above topics.  Patient acknowledges understanding and acceptance of all instructions.  I spoke with Ms. Tyrell regarding her admission and HF.  I have seen her during previous admissions.  She tells me that she has been avoiding added salt and being much more careful with high sodium foods.  She has quit eating soups.  She does say that she is not sure if her scale is accurate due to the large weight fluctuations at home and that she does not "eat that much".  I reinforced daily weights and how the weight gains relate to the signs and symptoms of HF.  She denies any issues with getting and taking prescribed medications.  She follows with [facility].  Education Materials:  "Living Better With Heart Failure" Booklet, Daily Weight Tracker Tool    High Risk Criteria for Readmission and/or Poor Patient Outcomes:   EF <30%- 65-70% with dias dys  2 or more admissions in 6 months- 2/6 mo  Difficult social situation- no   Demonstrates medication noncompliance- denies   Barriers of Care:  Knowledge and compliance  Discharge Planning:   She plans to return to [place].  She would greatly benefit from [facility] for ongoing HF education, symptom recognition and compliance reinforcement.

## 2016-02-19 NOTE — Consult Note (Signed)
Consultation Note   BRENDAN GRUWELL WLS:[id number] DOB: [birth year] DOA: 02/18/2016   PCP: Haywood Pao, MD   Patient coming from/Resides with: Private residence  Requesting physician: Dr. Mallie Mussel Smith/urology  Reason for consultation: Management of diabetes and acute gout  HPI: Brandi Melton is a 82 y.o. female with medical history significant for hypertension, stage III chronic kidney disease, diabetes on insulin, gout, dyslipidemia, sleep apnea, morbid obesity as well as chronic diastolic heart failure who was admitted by the cardiology team for acute heart failure exacerbation. Patient had failed outpatient adjustment of oral diuretics and has been admitted for IV diuresis and close monitoring of renal function. Patient reports that 24 hours prior to admission and after adjustments in outpatient diuretics she developed significant pain, redness and some swelling to the left transmetatarsal region with the great toe being the most painful. She reports the symptoms are similar to previous gout flares. She takes allopurinol.  Review of Systems:  In addition to the HPI above,  No Fever-chills, myalgias or other constitutional symptoms No Headache, changes with Vision or hearing, new weakness, tingling, numbness in any extremity, dizziness, dysarthria or word finding difficulty, gait disturbance or imbalance, tremors or seizure activity No problems swallowing food or Liquids, indigestion/reflux, choking or coughing while eating, abdominal pain with or after eating No Chest pain, Cough, palpitations, orthopnea  No Abdominal pain, N/V, melena,hematochezia, dark tarry stools, constipation No dysuria, malodorous urine, hematuria or flank pain No new skin rashes, lesions, masses or bruises, No new joint pains, aches, swelling or redness No recent unintentional weight gain or loss No polyuria, polydypsia or polyphagia   Past Medical History:  Diagnosis Date  . Arthritis    "hips before they  were replaced, right knee now" (11/09/2015)  . Chronic diastolic (congestive) heart failure    a. echo 06/2015: EF 65-70%, Grade 1 DD, mitral valve calcification.  . CKD (chronic kidney disease), stage III   . Depression   . H/O cardiac catheterization    a. h/o cardiac catheterization 05/2013 in [place] showing only minimal CAD with increased filling pressure.  . Hyperlipemia   . Hypertensive heart disease   . Morbid obesity ([place])   . Palsy, Bell's   . RA (rheumatoid arthritis) ([place])   . Sleep apnea    "suppose to wear mask; couldn't" (11/09/2015)  . Type II diabetes mellitus ([place])     Past Surgical History:  Procedure Laterality Date  . APPENDECTOMY  1966  . CARDIAC CATHETERIZATION  05/2013  . CARPAL TUNNEL RELEASE Right 08/08/2014   Procedure: OPEN CARPAL TUNNEL RELEASE;  Surgeon: Roseanne Kaufman, MD;  Location: [facility];  Service: Orthopedics;  Laterality: Right;  . CATARACT EXTRACTION W/ INTRAOCULAR LENS  IMPLANT, BILATERAL Bilateral   . CHOLECYSTECTOMY OPEN  1966  . COLONOSCOPY    . FRACTURE SURGERY    . JOINT REPLACEMENT    . KNEE ARTHROPLASTY Left   . ORIF WRIST FRACTURE Right 08/08/2014   Procedure: Right OPEN REDUCTION INTERNAL FIXATION (ORIF) WRIST FRACTURE WITH REPAIR AND RECONST/ALLOGRAFT AND BONE GRAFT;  Surgeon: Roseanne Kaufman, MD;  Location: [facility];  Service: Orthopedics;  Laterality: Right;  . TOTAL HIP ARTHROPLASTY Bilateral     Social History   Social History  . Marital status: Widowed    Spouse name: N/A  . Number of children: N/A  . Years of education: N/A   Occupational History  . Not on file.   Social History Main Topics  . Smoking status: Never Smoker  .  Smokeless tobacco: Never Used  . Alcohol use 0.0 oz/week     Comment: 11/09/2015 "might have glass of champaign on special occasion"  . Drug use: No  . Sexual activity: No   Other Topics Concern  . Not on file   Social History Narrative  . No narrative on file    Mobility: Rolling walker Work  history: Not obtained   No Known Allergies  Family History  Problem Relation Age of Onset  . Sudden death Mother   . Stroke Mother   . Hypertension Neg Hx      Prior to Admission medications   Medication Sig Start Date End Date Taking? Authorizing Provider  allopurinol (ZYLOPRIM) 300 MG tablet Take 300 mg by mouth at bedtime.  02/13/16  Yes Historical Provider, MD  aspirin EC 81 MG tablet Take 81 mg by mouth daily.   Yes Historical Provider, MD  calcium carbonate (TUMS EX) 750 MG chewable tablet Chew 1 tablet by mouth daily.   Yes Historical Provider, MD  carvedilol (COREG CR) 80 MG 24 hr capsule Take 1 capsule (80 mg total) by mouth daily. 06/25/15  Yes Fay Records, MD  chlorhexidine (PERIDEX) 0.12 % solution Use as directed 5 mLs in the mouth or throat 2 (two) times daily.  01/17/15  Yes Historical Provider, MD  cholecalciferol (VITAMIN D) 1000 units tablet Take 3,000 Units by mouth daily.   Yes Historical Provider, MD  ezetimibe-simvastatin (VYTORIN) 10-40 MG per tablet Take 1 tablet by mouth daily.    Yes Historical Provider, MD  folic acid (FOLVITE) 562 MCG tablet Take 400 mcg by mouth daily.   Yes Historical Provider, MD  insulin aspart (NOVOLOG) 100 UNIT/ML injection Inject 35 Units into the skin 4 (four) times daily.    Yes Historical Provider, MD  Insulin Detemir (LEVEMIR) 100 UNIT/ML Pen Inject 80 Units into the skin 2 (two) times daily.    Yes Historical Provider, MD  magnesium oxide (MAG-OX) 400 MG tablet Take 400 mg by mouth daily.   Yes Historical Provider, MD  potassium chloride SA (K-DUR,KLOR-CON) 20 MEQ tablet Take 20 mEq by mouth daily.   Yes Historical Provider, MD  telmisartan (MICARDIS) 20 MG tablet Take 20 mg by mouth daily.   Yes Historical Provider, MD  torsemide (DEMADEX) 20 MG tablet Take 1 tablet (20 mg total) by mouth 2 (two) times daily. 02/04/16 05/04/16 Yes Fay Records, MD  Vitamin D, Ergocalciferol, (DRISDOL) 50000 UNITS CAPS capsule Take 50,000 Units by  mouth every Wednesday.    Yes Historical Provider, MD    Physical Exam: Vitals:   02/19/16 0019 02/19/16 0520 02/19/16 0800 02/19/16 1228  BP: (!) 120/50 (!) 132/59 (!) 135/54 (!) 141/49  Pulse: 77 78 80 79  Resp: '20 20 18 18  ' Temp: 99 F (37.2 C) 98.2 F (36.8 C) 98.6 F (37 C) 98.6 F (37 C)  TempSrc: Oral Oral Oral Oral  SpO2: 95% 94% 92% 90%  Weight:  113.7 kg (250 lb 9.6 oz)    Height:          Constitutional: NAD, calm, uncomfortable secondary to redness/ swelling left transmetatarsal region Eyes: PERRL, lids and conjunctivae normal ENMT: Mucous membranes are moist. Posterior pharynx clear of any exudate or lesions.Normal dentition.  Neck: normal, supple, no masses, no thyromegaly Respiratory: clear to auscultation bilaterally, no wheezing, no crackles. Normal respiratory effort. No accessory muscle use.  Cardiovascular: Regular rate and rhythm, no murmurs / rubs / gallops. No extremity edema. 2+  pedal pulses. No carotid bruits.  Abdomen: no tenderness, slightly distended, no masses palpated. No hepatosplenomegaly. Bowel sounds positive.  Musculoskeletal: no clubbing / cyanosis. No joint deformity upper and lower extremities. Good ROM, no contractures. Normal muscle tone. Patient has erythematous changes with mild edema involving the entire left transmetatarsal region on the foot-more prominent in the great toe-these areas are exquisitely tender to minimal palpation, no ulcers and no drainage-no fluctuant areas noted Skin: no rashes, lesions, ulcers. No induration Neurologic: CN 2-12 grossly intact. Sensation intact, DTR normal. Strength 5/5 x all 4 extremities.  Psychiatric: Normal judgment and insight. Alert and oriented x 3. Normal mood.    Labs on Admission: I have personally reviewed following labs and imaging studies  CBC:  Recent Labs Lab 02/18/16 1632  WBC 11.4*  NEUTROABS 6.9  HGB 11.3*  HCT 36.3  MCV 98.9  PLT 660   Basic Metabolic Panel:  Recent  Labs Lab 02/18/16 1632 02/19/16 0311  NA 140 144  K 3.9 3.6  CL 102 104  CO2 29 30  GLUCOSE 90 51*  BUN 20 20  CREATININE 1.44* 1.38*  CALCIUM 9.2 8.9   GFR: Estimated Creatinine Clearance: 37.5 mL/min (by C-G formula based on SCr of 1.38 mg/dL (H)). Liver Function Tests:  Recent Labs Lab 02/18/16 1632  AST 19  ALT 18  ALKPHOS 80  BILITOT 0.6  PROT 6.7  ALBUMIN 3.2*   No results for input(s): LIPASE, AMYLASE in the last 168 hours. No results for input(s): AMMONIA in the last 168 hours. Coagulation Profile:  Recent Labs Lab 02/18/16 1632  INR 1.08   Cardiac Enzymes: No results for input(s): CKTOTAL, CKMB, CKMBINDEX, TROPONINI in the last 168 hours. BNP (last 3 results) No results for input(s): PROBNP in the last 8760 hours. HbA1C: No results for input(s): HGBA1C in the last 72 hours. CBG:  Recent Labs Lab 02/18/16 1650 02/18/16 2135 02/19/16 0551 02/19/16 0643  GLUCAP 87 150* 57* 92   Lipid Profile: No results for input(s): CHOL, HDL, LDLCALC, TRIG, CHOLHDL, LDLDIRECT in the last 72 hours. Thyroid Function Tests: No results for input(s): TSH, T4TOTAL, FREET4, T3FREE, THYROIDAB in the last 72 hours. Anemia Panel: No results for input(s): VITAMINB12, FOLATE, FERRITIN, TIBC, IRON, RETICCTPCT in the last 72 hours. Urine analysis: No results found for: COLORURINE, APPEARANCEUR, LABSPEC, PHURINE, GLUCOSEU, HGBUR, BILIRUBINUR, KETONESUR, PROTEINUR, UROBILINOGEN, NITRITE, LEUKOCYTESUR Sepsis Labs: '@LABRCNTIP' (procalcitonin:4,lacticidven:4) )No results found for this or any previous visit (from the past 240 hour(s)).   Radiological Exams on Admission: Dg Chest 2 View  Result Date: 02/19/2016 CLINICAL DATA:  82 year old female with a 2 day history of shortness of breath. Past medical history includes CHF. EXAM: CHEST  2 VIEW COMPARISON:  Prior chest x-ray 11/08/2015 FINDINGS: Slightly increased pulmonary vascular congestion without overt pulmonary edema. No  pleural effusion, focal airspace consolidation or pneumothorax. Lower inspiratory volumes with slightly increased bibasilar subsegmental atelectasis. Stable cardiac and mediastinal contours. Atherosclerotic calcifications present in the transverse aorta. IMPRESSION: 1. Slightly increased pulmonary vascular congestion without overt edema. 2. Lower inspiratory volumes with mild bibasilar subsegmental atelectasis. 3.  Aortic Atherosclerosis (ICD10-170.0). Electronically Signed   By: Jacqulynn Cadet M.D.   On: 02/19/2016 09:44     Assessment/Plan Principal Problem:   Acute on chronic diastolic CHF (congestive heart failure)  -Management per admitting team/cardiology -Requires both ARB and IV Lasix to achieve adequate diuresis  Active Problems:   Hypertension -Controlled, management per primary team    Diabetes mellitus type 2, insulin dependent  -  Patient on relatively high-dose Levemir at home (80 units twice a day) in addition to short acting NovoLog insulin 30 units 4 times a day -Patient with decreasing CBGs after 10 PM with a nadir of 51 at 3 AM -I have changed the NovoLog insulin to 20 units 3 times a day before meals -We'll continue with current dose of Levemir and follow for additional hypoglycemia noting we are adding prednisone which will likely increase her CBGs -Follow CBGs before meals/at bedtime and provide SSI -Hemoglobin A1c -Appreciate diabetes educator assistance    CKD (chronic kidney disease), stage III -Patient's renal function is stable and at baseline of 20/1.38 -Continues to require aggressive diuresis as well as utilization of ARB to manage heart failure -Because of severity of chronic kidney disease unable to utilize colchicine or NSAIDs in treatment of gout -Continue to follow renal function    Gout -Exam consistent with acute gout flare with significant pain and redness -Check uric acid and ESR -Unable to utilize colchicine or NSAIDs secondary to low GFR, CKD  3 -Begin prednisone 50 mg daily 4 days and reevaluate -Low-dose OxyIR for pain -Mobilize with assistance-patient has bedside commode available    Hyperlipemia -Continue Vytorin    Sleep apnea      DVT prophylaxis: Lovenox  Code Status: Full Family Communication: No family at bedside at time of evaluation  Disposition Plan: At discretion of primary team     Wrenley Sayed L. ANP-BC [facility] Pager [phone]   If 7PM-7AM, please contact night-coverage [email or web address] Password [facility]  02/19/2016, 12:36 PM

## 2016-02-20 ENCOUNTER — Encounter (HOSPITAL_COMMUNITY): Payer: Self-pay | Admitting: *Deleted

## 2016-02-20 DIAGNOSIS — M10072 Idiopathic gout, left ankle and foot: Secondary | ICD-10-CM

## 2016-02-20 DIAGNOSIS — E784 Other hyperlipidemia: Secondary | ICD-10-CM

## 2016-02-20 DIAGNOSIS — Z794 Long term (current) use of insulin: Secondary | ICD-10-CM

## 2016-02-20 DIAGNOSIS — N183 Chronic kidney disease, stage 3 (moderate): Secondary | ICD-10-CM

## 2016-02-20 DIAGNOSIS — E119 Type 2 diabetes mellitus without complications: Secondary | ICD-10-CM

## 2016-02-20 LAB — BASIC METABOLIC PANEL
ANION GAP: 9 (ref 5–15)
BUN: 26 mg/dL — ABNORMAL HIGH (ref 6–20)
CALCIUM: 8.7 mg/dL — AB (ref 8.9–10.3)
CO2: 27 mmol/L (ref 22–32)
Chloride: 104 mmol/L (ref 101–111)
Creatinine, Ser: 1.54 mg/dL — ABNORMAL HIGH (ref 0.44–1.00)
GFR, EST AFRICAN AMERICAN: 35 mL/min — AB (ref >60)
GFR, EST NON AFRICAN AMERICAN: 30 mL/min — AB (ref >60)
Glucose, Bld: 176 mg/dL — ABNORMAL HIGH (ref 65–99)
Potassium: 4.2 mmol/L (ref 3.5–5.1)
Sodium: 140 mmol/L (ref 135–145)

## 2016-02-20 LAB — GLUCOSE, CAPILLARY
Glucose-Capillary: 180 mg/dL — ABNORMAL HIGH (ref 65–99)
Glucose-Capillary: 253 mg/dL — ABNORMAL HIGH (ref 65–99)
Glucose-Capillary: 354 mg/dL — ABNORMAL HIGH (ref 65–99)
Glucose-Capillary: 322 mg/dL — ABNORMAL HIGH (ref 65–99)

## 2016-02-20 LAB — HEMOGLOBIN A1C
HEMOGLOBIN A1C: 7.2 % — AB (ref 4.8–5.6)
MEAN PLASMA GLUCOSE: 160 mg/dL

## 2016-02-20 MED ORDER — INSULIN ASPART 100 UNIT/ML ~~LOC~~ SOLN
0.0000 [IU] | Freq: Every day | SUBCUTANEOUS | Status: DC
  Administered 2016-02-20: 4 [IU] via SUBCUTANEOUS

## 2016-02-20 MED ORDER — INSULIN ASPART 100 UNIT/ML ~~LOC~~ SOLN
0.0000 [IU] | Freq: Three times a day (TID) | SUBCUTANEOUS | Status: DC
  Administered 2016-02-21 (×2): 3 [IU] via SUBCUTANEOUS
  Administered 2016-02-21: 5 [IU] via SUBCUTANEOUS
  Administered 2016-02-22: 2 [IU] via SUBCUTANEOUS

## 2016-02-20 MED ORDER — INSULIN ASPART 100 UNIT/ML ~~LOC~~ SOLN: 23.0000 [IU] | Freq: Three times a day (TID) | SUBCUTANEOUS | Status: DC

## 2016-02-20 MED ORDER — INSULIN ASPART 100 UNIT/ML ~~LOC~~ SOLN
23.0000 [IU] | Freq: Three times a day (TID) | SUBCUTANEOUS | Status: DC
Start: 2016-02-20 — End: 2016-02-21
  Administered 2016-02-20 – 2016-02-21 (×3): 23 [IU] via SUBCUTANEOUS

## 2016-02-20 NOTE — Progress Notes (Signed)
Physical Therapy Treatment Patient Details Name: Brandi Melton MRN: [id number] DOB: February 13, 1931 Today's Date: 02/20/2016    History of Present Illness Pt is an 82 y/o female admitted secondary to volume overload. PMH including but not limited to HTN, CKD and CHF.    PT Comments    Pt presented supine in bed with HOB elevated, awake and willing to participate in therapy session. Prior to admission, pt reported that she uses a rollator to ambulate. Pt moving well during evaluation. Pt on RA with SPO2 at 95% in sitting. After ambulation, pt's SPO2 dropped to 85% and increased to 94% after a sitting rest break for ~2 minutes. Pt would continue to benefit from skilled physical therapy services at this time while admitted to address her below listed limitations in order to improve her overall safety and independence with functional mobility.    Follow Up Recommendations  Supervision for mobility/OOB     Equipment Recommendations  None recommended by PT    Recommendations for Other Services       Precautions / Restrictions Precautions Precautions: None Restrictions Weight Bearing Restrictions: No    Mobility  Bed Mobility Overal bed mobility: Needs Assistance Bed Mobility: Supine to Sit;Sit to Supine     Supine to sit: Min guard;HOB elevated Sit to supine: Min assist   General bed mobility comments: pt required increased time and min guard for safety to achieve sitting EOB. When returning to supine, pt required min A with bilateral LEs.  Transfers Overall transfer level: Needs assistance Equipment used: Rolling walker (2 wheeled) Transfers: Sit to/from Stand Sit to Stand: Min guard         General transfer comment: pt required increased time and min guard for safety  Ambulation/Gait Ambulation/Gait assistance: Min guard Ambulation Distance (Feet): 40 Feet Assistive device: Rolling walker (2 wheeled) Gait Pattern/deviations: Step-through pattern;Decreased stride  length;Trunk flexed Gait velocity: decreased   General Gait Details: pt demonstrated safety with RW   Stairs            Wheelchair Mobility    Modified Rankin (Stroke Patients Only)       Balance Overall balance assessment: Needs assistance Sitting-balance support: Feet supported;No upper extremity supported Sitting balance-Leahy Scale: Fair     Standing balance support: During functional activity;Bilateral upper extremity supported Standing balance-Leahy Scale: Good Standing balance comment: pt required bilateral UEs on RW                    Cognition Arousal/Alertness: Awake/alert Behavior During Therapy: WFL for tasks assessed/performed Overall Cognitive Status: Within Functional Limits for tasks assessed                      Exercises      General Comments        Pertinent Vitals/Pain Pain Assessment: No/denies pain    Home Living Family/patient expects to be discharged to:: Other (Comment) (Independent living)               Additional Comments: Pt recently moved here from [place]     Prior Function Level of Independence: Independent with assistive device(s)      Comments: Pt stated that she uses a rollator to ambulate with all the time.   PT Goals (current goals can now be found in the care plan section) Acute Rehab PT Goals Patient Stated Goal: return home PT Goal Formulation: With patient Time For Goal Achievement: 03/05/16 Potential to Achieve Goals: Good    Frequency  Min 3X/week      PT Plan      Co-evaluation             End of Session Equipment Utilized During Treatment: Gait belt Activity Tolerance: Patient tolerated treatment well Patient left: in bed;with call bell/phone within reach     Time: 0846-0903 PT Time Calculation (min) (ACUTE ONLY): 17 min  Charges:  $Gait Training: 8-22 mins                    G CodesClearnce Sorrel Dewan Emond 2016-02-21, 12:01 PM Sherie Don, Long,  DPT [phone]

## 2016-02-20 NOTE — Progress Notes (Signed)
PROGRESS NOTE  Brandi Melton LGX:[id number] DOB: 11/30/1930 DOA: 02/18/2016 PCP: Haywood Pao, MD  Brief History:   82 y.o. female with medical history significant for hypertension, stage III chronic kidney disease, diabetes on insulin, gout, dyslipidemia, sleep apnea, morbid obesity as well as chronic diastolic heart failure who was admitted by the cardiology team for acute heart failure exacerbation. Patient had failed outpatient adjustment of oral diuretics and has been admitted for IV diuresis and close monitoring of renal function. Patient reports that 24 hours prior to admission and after adjustments in outpatient diuretics she developed significant pain, redness and some swelling to the left transmetatarsal region with the great toe being the most painful. She reports the symptoms are similar to previous gout flares. She takes allopurinol, but she states that she quit taking it for the past few weeks as she felt it precipitated a gouty attack a few weeks prior to hospitalization.  Assessment/Plan:   Acute on chronic diastolic CHF (congestive heart failure)  -Management per admitting team/cardiology -Requires both ARB and IV Lasix to achieve adequate diuresis    Hypertension -Controlled, management per primary team  Acute Gouty Flare -Check uric acid--7.0 -precipitated from recent fish ingestion and stopping allopurinol -Unable to utilize colchicine or NSAIDs secondary to low GFR, CKD 3 -Begin prednisone 50 mg daily 5 days and reevaluate -Low-dose OxyIR for pain -difficult situation as has CKD stage 3 which may contribute to her hyperuricemia -can continue allopurinol with concomitant use with prednisone -consult dietician -discussed avoiding red meats, fish, alcohol -discussed trying to increase plant based proteins such as legumes and dairy protein -try vitamin C after d/c    Diabetes mellitus type 2, insulin dependent  -continue Levemir 80 units bid -increase   novolog 23 units with meals -increase to moderate SSI -Follow CBGs before meals/at bedtime and provide SSI -Hemoglobin A1c7.2 -Appreciate diabetes educator assistance    CKD (chronic kidney disease), stage III -Baseline creatinine 1.3-1.6 -Continues to require aggressive diuresis as well as utilization of ARB to manage heart failure  -Because of severity of chronic kidney disease unable to utilize colchicine or NSAIDs in treatment of gout -Continue to follow renal function    Hyperlipemia -Continue Vytorin    Sleep apnea   Disposition Plan:   Home when cleared with cardiology Family Communication:   No Family at bedside  Consultants:  [place]  Code Status:  FULL   DVT Prophylaxis:  [place] Lovenox   Procedures: As Listed in Progress Note Above  Antibiotics: None    Subjective: Breathing better.  Left foot pain is better, but still some pain with weight bearing.  Patient denies fevers, chills, headache, chest pain, dyspnea, nausea, vomiting, diarrhea, abdominal pain, dysuria, hematuria, hematochezia, and melena.   Objective: Vitals:   02/19/16 1956 02/19/16 2235 02/20/16 0509 02/20/16 1206  BP: (!) 122/48  (!) 126/52 (!) 118/54  Pulse: 81  73 74  Resp: 18  20 20   Temp: 99.6 F (37.6 C)  98.3 F (36.8 C) 97.8 F (36.6 C)  TempSrc: Oral  Oral Oral  SpO2: 90% 98% 95% 96%  Weight:   111.1 kg (244 lb 14.4 oz)   Height:        Intake/Output Summary (Last 24 hours) at 02/20/16 1624 Last data filed at 02/20/16 1555  Gross per 24 hour  Intake              720 ml  Output  1700 ml  Net             -980 ml   Weight change: -3.175 kg (-7 lb) Exam:   General:  Pt is alert, follows commands appropriately, not in acute distress  HEENT: No icterus, No thrush, No neck mass, [place]/AT  Cardiovascular: RRR, S1/S2, no rubs, no gallops  Respiratory: bibasilar crackles  Abdomen: Soft/+BS, non tender, non distended, no guarding  Extremities: 1 + LE edema, No  lymphangitis, No petechiae, No rashes, no synovitis   Data Reviewed: I have personally reviewed following labs and imaging studies Basic Metabolic Panel:  Recent Labs Lab 02/18/16 1632 02/19/16 0311 02/20/16 0300  NA 140 144 140  K 3.9 3.6 4.2  CL 102 104 104  CO2 29 30 27   GLUCOSE 90 51* 176*  BUN 20 20 26*  CREATININE 1.44* 1.38* 1.54*  CALCIUM 9.2 8.9 8.7*   Liver Function Tests:  Recent Labs Lab 02/18/16 1632  AST 19  ALT 18  ALKPHOS 80  BILITOT 0.6  PROT 6.7  ALBUMIN 3.2*   No results for input(s): LIPASE, AMYLASE in the last 168 hours. No results for input(s): AMMONIA in the last 168 hours. Coagulation Profile:  Recent Labs Lab 02/18/16 1632  INR 1.08   CBC:  Recent Labs Lab 02/18/16 1632  WBC 11.4*  NEUTROABS 6.9  HGB 11.3*  HCT 36.3  MCV 98.9  PLT 225   Cardiac Enzymes: No results for input(s): CKTOTAL, CKMB, CKMBINDEX, TROPONINI in the last 168 hours. BNP: Invalid input(s): POCBNP CBG:  Recent Labs Lab 02/19/16 1233 02/19/16 1657 02/19/16 2047 02/20/16 0554 02/20/16 1201  GLUCAP 129* 155* 154* 180* 253*   HbA1C:  Recent Labs  02/19/16 1212  HGBA1C 7.2*   Urine analysis: No results found for: COLORURINE, APPEARANCEUR, LABSPEC, PHURINE, GLUCOSEU, HGBUR, BILIRUBINUR, KETONESUR, PROTEINUR, UROBILINOGEN, NITRITE, LEUKOCYTESUR Sepsis Labs: @LABRCNTIP (procalcitonin:4,lacticidven:4) )No results found for this or any previous visit (from the past 240 hour(s)).   Scheduled Meds: . allopurinol  300 mg Oral Daily  . aspirin EC  81 mg Oral Daily  . calcium carbonate  1 tablet Oral Daily  . carvedilol  80 mg Oral Daily  . chlorhexidine  5 mL Mouth/Throat BID  . cholecalciferol  3,000 Units Oral Daily  . enoxaparin (LOVENOX) injection  40 mg Subcutaneous Q24H  . ezetimibe-simvastatin  1 tablet Oral Daily  . folic acid  191 mcg Oral Daily  . furosemide  60 mg Intravenous BID  . insulin aspart  0-5 Units Subcutaneous QHS  . insulin  aspart  0-9 Units Subcutaneous TID WC  . insulin aspart  20 Units Subcutaneous TID WC  . insulin detemir  80 Units Subcutaneous BID  . irbesartan  75 mg Oral Daily  . magnesium oxide  400 mg Oral Daily  . potassium chloride  20 mEq Oral BID  . predniSONE  50 mg Oral Q breakfast  . sodium chloride flush  3 mL Intravenous Q12H  . Vitamin D (Ergocalciferol)  50,000 Units Oral Q Wed   Continuous Infusions:  Procedures/Studies: Dg Chest 2 View  Result Date: 02/19/2016 CLINICAL DATA:  82 year old female with a 2 day history of shortness of breath. Past medical history includes CHF. EXAM: CHEST  2 VIEW COMPARISON:  Prior chest x-ray 11/08/2015 FINDINGS: Slightly increased pulmonary vascular congestion without overt pulmonary edema. No pleural effusion, focal airspace consolidation or pneumothorax. Lower inspiratory volumes with slightly increased bibasilar subsegmental atelectasis. Stable cardiac and mediastinal contours. Atherosclerotic calcifications present in the transverse  aorta. IMPRESSION: 1. Slightly increased pulmonary vascular congestion without overt edema. 2. Lower inspiratory volumes with mild bibasilar subsegmental atelectasis. 3.  Aortic Atherosclerosis (ICD10-170.0). Electronically Signed   By: Jacqulynn Cadet M.D.   On: 02/19/2016 09:44   Ct Foot Left Wo Contrast  Result Date: 02/20/2016 CLINICAL DATA:  Left foot pain which is worst about the great toe in patient with history of gout. No known injury. EXAM: CT OF THE LEFT FOOT WITHOUT CONTRAST TECHNIQUE: Multidetector CT imaging of the left foot was performed according to the standard protocol. Multiplanar CT image reconstructions were also generated. COMPARISON:  None. FINDINGS: Bones/Joint/Cartilage No acute bony or joint abnormality is identified. No evidence of gouty arthropathy is seen. The patient has midfoot osteoarthritis with joint space narrowing, subchondral sclerosis and osteophytosis worst about the second and third  tarsometatarsal joints. Moderate first MTP osteoarthritis is also seen with an osteophyte off the inferior margin of the first metatarsal noted. No erosion or periostitis is identified. Ligaments Suboptimally assessed by CT. Muscles and Tendons Intact and unremarkable. Soft tissues Mild subcutaneous edema is seen over the dorsum of the foot. IMPRESSION: No acute abnormality.  Negative for evidence of gout. Midfoot and first MTP osteoarthritis. Mild subcutaneous edema over the dorsum of the foot is likely due to dependent change. Electronically Signed   By: Inge Rise M.D.   On: 02/20/2016 09:38    Lachell Rochette, DO  [facility] Pager [phone]  If 7PM-7AM, please contact night-coverage [email or web address] Password [email or web address] 02/20/2016, 4:24 PM   LOS: 2 days

## 2016-02-20 NOTE — Progress Notes (Signed)
Patient Name: Brandi Melton Date of Encounter: 02/20/2016  Primary Cardiologist: Dorris Carnes, M.D.  Hospital Problem List     Principal Problem:   Acute on chronic diastolic CHF (congestive heart failure) (HCC) Active Problems:   Diabetes mellitus type 2, insulin dependent ([place])   Hypertension   Hyperlipemia   Sleep apnea   CKD (chronic kidney disease), stage III   Gout   Acute idiopathic gout     Subjective   She is very anxious this morning concerning her condition. She does admit that her left leg is much better and she was able to walk to the bathroom this morning. Breathing is somewhat improved but she feels "puffy".  She has sleep apnea but refuses therapy due to claustrophobia.  Inpatient Medications    Scheduled Meds: . allopurinol  300 mg Oral Daily  . aspirin EC  81 mg Oral Daily  . calcium carbonate  1 tablet Oral Daily  . carvedilol  80 mg Oral Daily  . chlorhexidine  5 mL Mouth/Throat BID  . cholecalciferol  3,000 Units Oral Daily  . enoxaparin (LOVENOX) injection  40 mg Subcutaneous Q24H  . ezetimibe-simvastatin  1 tablet Oral Daily  . folic acid  644 mcg Oral Daily  . furosemide  60 mg Intravenous BID  . insulin aspart  0-5 Units Subcutaneous QHS  . insulin aspart  0-9 Units Subcutaneous TID WC  . insulin aspart  20 Units Subcutaneous TID WC  . insulin detemir  80 Units Subcutaneous BID  . irbesartan  75 mg Oral Daily  . magnesium oxide  400 mg Oral Daily  . potassium chloride  20 mEq Oral BID  . predniSONE  50 mg Oral Q breakfast  . sodium chloride flush  3 mL Intravenous Q12H  . Vitamin D (Ergocalciferol)  50,000 Units Oral Q Wed   Continuous Infusions:  PRN Meds: sodium chloride, acetaminophen, ondansetron (ZOFRAN) IV, oxyCODONE, sodium chloride flush   Vital Signs    Vitals:   02/19/16 1228 02/19/16 1956 02/19/16 2235 02/20/16 0509  BP: (!) 141/49 (!) 122/48  (!) 126/52  Pulse: 79 81  73  Resp: 18 18  20   Temp: 98.6 F (37 C) 99.6 F  (37.6 C)  98.3 F (36.8 C)  TempSrc: Oral Oral  Oral  SpO2: 90% 90% 98% 95%  Weight:    244 lb 14.4 oz (111.1 kg)  Height:        Intake/Output Summary (Last 24 hours) at 02/20/16 1119 Last data filed at 02/20/16 0600  Gross per 24 hour  Intake              360 ml  Output              800 ml  Net             -440 ml   Filed Weights   02/18/16 1516 02/19/16 0520 02/20/16 0509  Weight: 251 lb 14.4 oz (114.3 kg) 250 lb 9.6 oz (113.7 kg) 244 lb 14.4 oz (111.1 kg)    Physical Exam    GEN: Morbidly obese. in no acute distress.  HEENT: Grossly normal.  Neck: Supple, no JVD, carotid bruits, or masses. Cardiac: RRR, no murmurs, rubs, or gallops. No clubbing, cyanosis, edema.  Radials/DP/PT 2+ and equal bilaterally.  Respiratory:  Respirations regular and unlabored, clear to auscultation bilaterally. GI: Soft, nontender, nondistended, BS + x 4. MS: no deformity or atrophy. Skin: warm and dry, no rash. Neuro:  Strength and  sensation are intact. Psych: AAOx3.  Normal affect.  Labs    CBC  Recent Labs  02/18/16 1632  WBC 11.4*  NEUTROABS 6.9  HGB 11.3*  HCT 36.3  MCV 98.9  PLT 160   Basic Metabolic Panel  Recent Labs  02/19/16 0311 02/20/16 0300  NA 144 140  K 3.6 4.2  CL 104 104  CO2 30 27  GLUCOSE 51* 176*  BUN 20 26*  CREATININE 1.38* 1.54*  CALCIUM 8.9 8.7*   Liver Function Tests  Recent Labs  02/18/16 1632  AST 19  ALT 18  ALKPHOS 80  BILITOT 0.6  PROT 6.7  ALBUMIN 3.2*   No results for input(s): LIPASE, AMYLASE in the last 72 hours. Cardiac Enzymes No results for input(s): CKTOTAL, CKMB, CKMBINDEX, TROPONINI in the last 72 hours. BNP Invalid input(s): POCBNP D-Dimer No results for input(s): DDIMER in the last 72 hours. Hemoglobin A1C  Recent Labs  02/19/16 1212  HGBA1C 7.2*   Fasting Lipid Panel No results for input(s): CHOL, HDL, LDLCALC, TRIG, CHOLHDL, LDLDIRECT in the last 72 hours. Thyroid Function Tests No results for input(s):  TSH, T4TOTAL, T3FREE, THYROIDAB in the last 72 hours.  Invalid input(s): FREET3  Telemetry    Normal sinus rhythm - Personally Reviewed  ECG    No new tracing performed today.   Radiology    Dg Chest 2 View  Result Date: 02/19/2016 CLINICAL DATA:  82 year old female with a 2 day history of shortness of breath. Past medical history includes CHF. EXAM: CHEST  2 VIEW COMPARISON:  Prior chest x-ray 11/08/2015 FINDINGS: Slightly increased pulmonary vascular congestion without overt pulmonary edema. No pleural effusion, focal airspace consolidation or pneumothorax. Lower inspiratory volumes with slightly increased bibasilar subsegmental atelectasis. Stable cardiac and mediastinal contours. Atherosclerotic calcifications present in the transverse aorta. IMPRESSION: 1. Slightly increased pulmonary vascular congestion without overt edema. 2. Lower inspiratory volumes with mild bibasilar subsegmental atelectasis. 3.  Aortic Atherosclerosis (ICD10-170.0). Electronically Signed   By: Jacqulynn Cadet M.D.   On: 02/19/2016 09:44   Ct Foot Left Wo Contrast  Result Date: 02/20/2016 CLINICAL DATA:  Left foot pain which is worst about the great toe in patient with history of gout. No known injury. EXAM: CT OF THE LEFT FOOT WITHOUT CONTRAST TECHNIQUE: Multidetector CT imaging of the left foot was performed according to the standard protocol. Multiplanar CT image reconstructions were also generated. COMPARISON:  None. FINDINGS: Bones/Joint/Cartilage No acute bony or joint abnormality is identified. No evidence of gouty arthropathy is seen. The patient has midfoot osteoarthritis with joint space narrowing, subchondral sclerosis and osteophytosis worst about the second and third tarsometatarsal joints. Moderate first MTP osteoarthritis is also seen with an osteophyte off the inferior margin of the first metatarsal noted. No erosion or periostitis is identified. Ligaments Suboptimally assessed by CT. Muscles and  Tendons Intact and unremarkable. Soft tissues Mild subcutaneous edema is seen over the dorsum of the foot. IMPRESSION: No acute abnormality.  Negative for evidence of gout. Midfoot and first MTP osteoarthritis. Mild subcutaneous edema over the dorsum of the foot is likely due to dependent change. Electronically Signed   By: Inge Rise M.D.   On: 02/20/2016 09:38    Cardiac Studies   No new cardiac data  Patient Profile     82 year old with chronic diastolic heart failure who presents with acute on chronic volume overload, history of 2diabetes, nonobstructive CAD by cath 2015, essential hypertension, hyperlipidemia, sleep apnea, and history of both rheumatoid arthritis and gout.  Admitted from our office on 02/18/16 because of a failure to diurese with up titration of diuretic regimen over the preceding 3 weeks. Her major complaint on admission however was left leg pain that she feels is related to gout. Known chronic kidney disease.  Assessment & Plan    1. Acute on chronic diastolic heart failure , there is improvement in volume status with diuresis. She is now 2.7 L negative. Weight is down 7 pounds since admission. 2. Suspected acute gout improved after adding prednisone.  3. Acute on Chronic kidney disease now with slight bump in creatinine with a level of diuresis described above. Plan to continue IV diuresis today before followed kidney function very closely.   Signed, Sinclair Grooms, MD  02/20/2016, 11:19 AM

## 2016-02-21 ENCOUNTER — Encounter (HOSPITAL_COMMUNITY): Payer: Self-pay | Admitting: *Deleted

## 2016-02-21 LAB — GLUCOSE, CAPILLARY
GLUCOSE-CAPILLARY: 186 mg/dL — AB (ref 65–99)
GLUCOSE-CAPILLARY: 213 mg/dL — AB (ref 65–99)
Glucose-Capillary: 164 mg/dL — ABNORMAL HIGH (ref 65–99)
Glucose-Capillary: 196 mg/dL — ABNORMAL HIGH (ref 65–99)

## 2016-02-21 LAB — BASIC METABOLIC PANEL
Anion gap: 10 (ref 5–15)
BUN: 40 mg/dL — AB (ref 6–20)
CALCIUM: 8.9 mg/dL (ref 8.9–10.3)
CHLORIDE: 100 mmol/L — AB (ref 101–111)
CO2: 29 mmol/L (ref 22–32)
CREATININE: 1.68 mg/dL — AB (ref 0.44–1.00)
GFR calc Af Amer: 31 mL/min — ABNORMAL LOW (ref >60)
GFR calc non Af Amer: 27 mL/min — ABNORMAL LOW (ref >60)
Glucose, Bld: 227 mg/dL — ABNORMAL HIGH (ref 65–99)
Potassium: 4.4 mmol/L (ref 3.5–5.1)
SODIUM: 139 mmol/L (ref 135–145)

## 2016-02-21 MED ORDER — PREDNISONE 50 MG PO TABS: 50.0000 mg | ORAL_TABLET | Freq: Every day | ORAL | 0 refills | Status: DC

## 2016-02-21 MED ORDER — INSULIN ASPART 100 UNIT/ML ~~LOC~~ SOLN
26.0000 [IU] | Freq: Three times a day (TID) | SUBCUTANEOUS | Status: DC
  Administered 2016-02-21 – 2016-02-22 (×2): 26 [IU] via SUBCUTANEOUS

## 2016-02-21 MED ORDER — ALUM & MAG HYDROXIDE-SIMETH 200-200-20 MG/5ML PO SUSP
30.0000 mL | ORAL | Status: DC | PRN
  Administered 2016-02-21: 30 mL via ORAL
  Filled 2016-02-21: qty 30

## 2016-02-21 MED ORDER — VITAMIN C 500 MG PO TABS
500.0000 mg | ORAL_TABLET | Freq: Every day | ORAL | Status: DC
  Administered 2016-02-21 – 2016-02-22 (×2): 500 mg via ORAL
  Filled 2016-02-21 (×3): qty 1

## 2016-02-21 NOTE — Progress Notes (Signed)
PROGRESS NOTE  Brandi Melton:[id number] DOB: 06-Oct-1930 DOA: 02/18/2016 PCP: Haywood Pao, MD  Brief History:  82 y.o.femalewith medical history significant for hypertension, stage III chronic kidney disease, diabetes on insulin, gout, dyslipidemia, sleep apnea, morbid obesity as well as chronic diastolic heart failure who was admitted by the cardiology team for acute heart failure exacerbation. Patient had failed outpatient adjustment of oral diuretics and has been admitted for IV diuresis and close monitoring of renal function. Patient reports that 24 hours prior to admission and after adjustments in outpatient diuretics she developed significant pain,redness and some swelling to the left transmetatarsal region with thegreat toe being the most painful. She reports the symptoms are similar to previous gout flares. She takes allopurinol, but she states that she quit taking it for the past few weeks as she felt it precipitated a gouty attack a few weeks prior to hospitalization.  Assessment/Plan: Acute on chronic diastolic CHF (congestive heart failure)  -Management per admitting team/cardiology -down 6 lbs and 3.1L -diuretics per cardiology  Hypertension -Controlled,management per primary team  Acute Gouty Flare -Check uric acid--7.0 -precipitated from recent fish ingestion and stopping allopurinol -Unable to utilize colchicine or NSAIDs secondary to low GFR, CKD 3 -Begin prednisone 50 mg daily 5 days-->last day of prednisone 02/23/16 -difficult situation as has CKD stage 3 which may contribute to her hyperuricemia -can continue allopurinol with concomitant use with prednisone-->home with allopurinol 300 mg daily -consulted dietician -discussed avoiding red meats, fish, alcohol -discussed trying to increase plant based proteins such as legumes and dairy protein -try vitamin C 500 mg daily with may also help decrease uric acid  Diabetes mellitus type 2,  insulin dependent  -continue Levemir 80 units bid -increase  novolog 26 units with meals-->home with prior novolog dose -increased to moderate SSI -Follow CBGs before meals/at bedtime and provide SSI -Hemoglobin A1c7.2  CKD (chronic kidney disease), stage III -Baseline creatinine 1.3-1.6 -Continues to require aggressive diuresis as well as utilization of ARB to manage heart failure  -Because of severity of chronic kidney disease unable to utilize colchicine or NSAIDs in treatment of gout -Continue to follow renal function  Hyperlipidemia -Continue Vytorin  Sleep apnea   Signing off--happy to revisit if any questions Family Communication:   No Family at bedside  Consultants:  [place]  Code Status:  FULL   DVT Prophylaxis:  [place] Lovenox   Procedures: As Listed in Progress Note Above  Antibiotics: None           Subjective: Left foot is feeling better, now able to bear weight, but still having some pain.  Patient denies fevers, chills, headache, chest pain, dyspnea, nausea, vomiting, diarrhea, abdominal pain, dysuria, hematuria, hematochezia, and melena.   Objective: Vitals:   02/21/16 0820 02/21/16 0900 02/21/16 0959 02/21/16 1151  BP:    (!) 125/53  Pulse: 62   64  Resp: 20   20  Temp: 97.8 F (36.6 C)   98.2 F (36.8 C)  TempSrc: Oral   Oral  SpO2: 97% (!) 89% 96% 90%  Weight:      Height:        Intake/Output Summary (Last 24 hours) at 02/21/16 1703 Last data filed at 02/21/16 1353  Gross per 24 hour  Intake             1200 ml  Output             1450 ml  Net             -  250 ml   Weight change: 0.454 kg (1 lb) Exam:   General:  Pt is alert, follows commands appropriately, not in acute distress  HEENT: No icterus, No thrush, No neck mass, [place]/AT  Cardiovascular: RRR, S1/S2, no rubs, no gallops  Respiratory: fine basilar rales, no wheeze  Abdomen: Soft/+BS, non tender, non distended, no guarding  Extremities: trace LE  edema, No lymphangitis, No petechiae, No rashes, no synovitis;  Left first MTP joint pain with palpation   Data Reviewed: I have personally reviewed following labs and imaging studies Basic Metabolic Panel:  Recent Labs Lab 02/18/16 1632 02/19/16 0311 02/20/16 0300 02/21/16 0314  NA 140 144 140 139  K 3.9 3.6 4.2 4.4  CL 102 104 104 100*  CO2 29 30 27 29   GLUCOSE 90 51* 176* 227*  BUN 20 20 26* 40*  CREATININE 1.44* 1.38* 1.54* 1.68*  CALCIUM 9.2 8.9 8.7* 8.9   Liver Function Tests:  Recent Labs Lab 02/18/16 1632  AST 19  ALT 18  ALKPHOS 80  BILITOT 0.6  PROT 6.7  ALBUMIN 3.2*   No results for input(s): LIPASE, AMYLASE in the last 168 hours. No results for input(s): AMMONIA in the last 168 hours. Coagulation Profile:  Recent Labs Lab 02/18/16 1632  INR 1.08   CBC:  Recent Labs Lab 02/18/16 1632  WBC 11.4*  NEUTROABS 6.9  HGB 11.3*  HCT 36.3  MCV 98.9  PLT 225   Cardiac Enzymes: No results for input(s): CKTOTAL, CKMB, CKMBINDEX, TROPONINI in the last 168 hours. BNP: Invalid input(s): POCBNP CBG:  Recent Labs Lab 02/20/16 1636 02/20/16 2103 02/21/16 0558 02/21/16 1129 02/21/16 1632  GLUCAP 354* 322* 164* 186* 213*   HbA1C:  Recent Labs  02/19/16 1212  HGBA1C 7.2*   Urine analysis: No results found for: COLORURINE, APPEARANCEUR, LABSPEC, PHURINE, GLUCOSEU, HGBUR, BILIRUBINUR, KETONESUR, PROTEINUR, UROBILINOGEN, NITRITE, LEUKOCYTESUR Sepsis Labs: @LABRCNTIP (procalcitonin:4,lacticidven:4) )No results found for this or any previous visit (from the past 240 hour(s)).   Scheduled Meds: . allopurinol  300 mg Oral Daily  . aspirin EC  81 mg Oral Daily  . calcium carbonate  1 tablet Oral Daily  . carvedilol  80 mg Oral Daily  . chlorhexidine  5 mL Mouth/Throat BID  . cholecalciferol  3,000 Units Oral Daily  . enoxaparin (LOVENOX) injection  40 mg Subcutaneous Q24H  . ezetimibe-simvastatin  1 tablet Oral Daily  . folic acid  161 mcg Oral  Daily  . insulin aspart  0-15 Units Subcutaneous TID WC  . insulin aspart  0-5 Units Subcutaneous QHS  . insulin aspart  26 Units Subcutaneous TID WC  . insulin detemir  80 Units Subcutaneous BID  . irbesartan  75 mg Oral Daily  . magnesium oxide  400 mg Oral Daily  . predniSONE  50 mg Oral Q breakfast  . sodium chloride flush  3 mL Intravenous Q12H  . Vitamin D (Ergocalciferol)  50,000 Units Oral Q Wed   Continuous Infusions:  Procedures/Studies: Dg Chest 2 View  Result Date: 02/19/2016 CLINICAL DATA:  82 year old female with a 2 day history of shortness of breath. Past medical history includes CHF. EXAM: CHEST  2 VIEW COMPARISON:  Prior chest x-ray 11/08/2015 FINDINGS: Slightly increased pulmonary vascular congestion without overt pulmonary edema. No pleural effusion, focal airspace consolidation or pneumothorax. Lower inspiratory volumes with slightly increased bibasilar subsegmental atelectasis. Stable cardiac and mediastinal contours. Atherosclerotic calcifications present in the transverse aorta. IMPRESSION: 1. Slightly increased pulmonary vascular congestion without overt edema. 2. Lower  inspiratory volumes with mild bibasilar subsegmental atelectasis. 3.  Aortic Atherosclerosis (ICD10-170.0). Electronically Signed   By: Jacqulynn Cadet M.D.   On: 02/19/2016 09:44   Ct Foot Left Wo Contrast  Result Date: 02/20/2016 CLINICAL DATA:  Left foot pain which is worst about the great toe in patient with history of gout. No known injury. EXAM: CT OF THE LEFT FOOT WITHOUT CONTRAST TECHNIQUE: Multidetector CT imaging of the left foot was performed according to the standard protocol. Multiplanar CT image reconstructions were also generated. COMPARISON:  None. FINDINGS: Bones/Joint/Cartilage No acute bony or joint abnormality is identified. No evidence of gouty arthropathy is seen. The patient has midfoot osteoarthritis with joint space narrowing, subchondral sclerosis and osteophytosis worst about  the second and third tarsometatarsal joints. Moderate first MTP osteoarthritis is also seen with an osteophyte off the inferior margin of the first metatarsal noted. No erosion or periostitis is identified. Ligaments Suboptimally assessed by CT. Muscles and Tendons Intact and unremarkable. Soft tissues Mild subcutaneous edema is seen over the dorsum of the foot. IMPRESSION: No acute abnormality.  Negative for evidence of gout. Midfoot and first MTP osteoarthritis. Mild subcutaneous edema over the dorsum of the foot is likely due to dependent change. Electronically Signed   By: Inge Rise M.D.   On: 02/20/2016 09:38    Isacc Turney, DO  [facility] Pager [phone]  If 7PM-7AM, please contact night-coverage [email or web address] Password [email or web address] 02/21/2016, 5:03 PM   LOS: 3 days

## 2016-02-21 NOTE — Progress Notes (Addendum)
SATURATION QUALIFICATIONS: (This note is used to comply with regulatory documentation for home oxygen)  Patient Saturations on Room Air at Rest = 94%  Patient Saturations on Room Air while Ambulating = 89%  Pt ambulated 50 ft in hallway and back to room. Pt tolerated ambulation. After ambulation pt rested and returned to 94% on RA.

## 2016-02-21 NOTE — Progress Notes (Addendum)
Patient Name: Brandi Melton Date of Encounter: 02/21/2016  Primary Cardiologist: Dorris Carnes, M.D.  Hospital Problem List     Principal Problem:   Acute on chronic diastolic CHF (congestive heart failure) (HCC) Active Problems:   Diabetes mellitus type 2, insulin dependent ([place])   Hypertension   Hyperlipemia   Sleep apnea   CKD (chronic kidney disease), stage III   Gout   Acute idiopathic gout     Subjective   She is very anxious this morning concerning her condition. She does admit that her left leg is much better and she was able to walk to the bathroom this morning. Breathing is somewhat improved but she feels "puffy".  She has sleep apnea but refuses therapy due to claustrophobia.  Inpatient Medications    Scheduled Meds: . allopurinol  300 mg Oral Daily  . aspirin EC  81 mg Oral Daily  . calcium carbonate  1 tablet Oral Daily  . carvedilol  80 mg Oral Daily  . chlorhexidine  5 mL Mouth/Throat BID  . cholecalciferol  3,000 Units Oral Daily  . enoxaparin (LOVENOX) injection  40 mg Subcutaneous Q24H  . ezetimibe-simvastatin  1 tablet Oral Daily  . folic acid  631 mcg Oral Daily  . insulin aspart  0-15 Units Subcutaneous TID WC  . insulin aspart  0-5 Units Subcutaneous QHS  . insulin aspart  23 Units Subcutaneous TID WC  . insulin detemir  80 Units Subcutaneous BID  . irbesartan  75 mg Oral Daily  . magnesium oxide  400 mg Oral Daily  . predniSONE  50 mg Oral Q breakfast  . sodium chloride flush  3 mL Intravenous Q12H  . Vitamin D (Ergocalciferol)  50,000 Units Oral Q Wed   Continuous Infusions:  PRN Meds: sodium chloride, acetaminophen, ondansetron (ZOFRAN) IV, oxyCODONE, sodium chloride flush   Vital Signs    Vitals:   02/21/16 0443 02/21/16 0820 02/21/16 0900 02/21/16 0959  BP: (!) 118/44     Pulse: 64 62    Resp: 18 20    Temp: 97.9 F (36.6 C) 97.8 F (36.6 C)    TempSrc: Oral Oral    SpO2: 96% 97% (!) 89% 96%  Weight: 245 lb 14.4 oz (111.5 kg)      Height:        Intake/Output Summary (Last 24 hours) at 02/21/16 1057 Last data filed at 02/21/16 0826  Gross per 24 hour  Intake             1200 ml  Output             1700 ml  Net             -500 ml   Filed Weights   02/19/16 0520 02/20/16 0509 02/21/16 0443  Weight: 250 lb 9.6 oz (113.7 kg) 244 lb 14.4 oz (111.1 kg) 245 lb 14.4 oz (111.5 kg)    Physical Exam    GEN: Morbidly obese. in no acute distress.  HEENT: Grossly normal.  Neck: Supple, no JVD, carotid bruits, or masses. Cardiac: RRR, no murmurs, rubs, or gallops. No clubbing, cyanosis, edema.  Radials/DP/PT 2+ and equal bilaterally.  Respiratory:  Respirations regular and unlabored, clear to auscultation bilaterally. GI: Soft, nontender, nondistended, BS + x 4. MS: no deformity or atrophy. Skin: warm and dry, no rash. Neuro:  Strength and sensation are intact. Psych: AAOx3.  Normal affect.  Labs    CBC  Recent Labs  02/18/16 1632  WBC 11.4*  NEUTROABS 6.9  HGB 11.3*  HCT 36.3  MCV 98.9  PLT 765   Basic Metabolic Panel  Recent Labs  02/20/16 0300 02/21/16 0314  NA 140 139  K 4.2 4.4  CL 104 100*  CO2 27 29  GLUCOSE 176* 227*  BUN 26* 40*  CREATININE 1.54* 1.68*  CALCIUM 8.7* 8.9   Liver Function Tests  Recent Labs  02/18/16 1632  AST 19  ALT 18  ALKPHOS 80  BILITOT 0.6  PROT 6.7  ALBUMIN 3.2*   No results for input(s): LIPASE, AMYLASE in the last 72 hours. Cardiac Enzymes No results for input(s): CKTOTAL, CKMB, CKMBINDEX, TROPONINI in the last 72 hours. BNP Invalid input(s): POCBNP D-Dimer No results for input(s): DDIMER in the last 72 hours. Hemoglobin A1C  Recent Labs  02/19/16 1212  HGBA1C 7.2*   Fasting Lipid Panel No results for input(s): CHOL, HDL, LDLCALC, TRIG, CHOLHDL, LDLDIRECT in the last 72 hours. Thyroid Function Tests No results for input(s): TSH, T4TOTAL, T3FREE, THYROIDAB in the last 72 hours.  Invalid input(s): FREET3  Telemetry    Normal sinus  rhythm - Personally Reviewed  ECG    No new tracing performed today.   Radiology    Ct Foot Left Wo Contrast  Result Date: 02/20/2016 CLINICAL DATA:  Left foot pain which is worst about the great toe in patient with history of gout. No known injury. EXAM: CT OF THE LEFT FOOT WITHOUT CONTRAST TECHNIQUE: Multidetector CT imaging of the left foot was performed according to the standard protocol. Multiplanar CT image reconstructions were also generated. COMPARISON:  None. FINDINGS: Bones/Joint/Cartilage No acute bony or joint abnormality is identified. No evidence of gouty arthropathy is seen. The patient has midfoot osteoarthritis with joint space narrowing, subchondral sclerosis and osteophytosis worst about the second and third tarsometatarsal joints. Moderate first MTP osteoarthritis is also seen with an osteophyte off the inferior margin of the first metatarsal noted. No erosion or periostitis is identified. Ligaments Suboptimally assessed by CT. Muscles and Tendons Intact and unremarkable. Soft tissues Mild subcutaneous edema is seen over the dorsum of the foot. IMPRESSION: No acute abnormality.  Negative for evidence of gout. Midfoot and first MTP osteoarthritis. Mild subcutaneous edema over the dorsum of the foot is likely due to dependent change. Electronically Signed   By: Inge Rise M.D.   On: 02/20/2016 09:38    Cardiac Studies   No new cardiac data  Patient Profile     82 year old with chronic diastolic heart failure who presents with acute on chronic volume overload, history of 2diabetes, nonobstructive CAD by cath 2015, essential hypertension, hyperlipidemia, sleep apnea, and history of both rheumatoid arthritis and gout. Admitted from our office on 02/18/16 because of a failure to diurese with up titration of diuretic regimen over the preceding 3 weeks. Her major complaint on admission however was left leg pain that she feels is related to gout. Known chronic kidney  disease.  Assessment & Plan    1. Acute on chronic diastolic heart failure , meager diuresis prior to development of A/CKD, creat now up to 1.68. Will get PT evaluation. Possibly home tomorrow. Get advance HF team assessment. 2. Suspected acute gout improved after adding prednisone. Much improved. 3. Acute on Chronic kidney disease, Stage 3-4, hold diuretic therapy today.   Signed, Sinclair Grooms, MD  02/21/2016, 10:57 AM

## 2016-02-21 NOTE — Consult Note (Signed)
Advanced Heart Failure Team Consult Note  Referring Physician: Dr. Tamala Julian Primary Physician: No PCP per patient.  Primary Cardiologist:  Dr. Dorris Carnes  Reason for Consultation: A/C diastolic CHF  HPI:    Brandi Melton is a 82 y.o. female with history of chronic diastolic CHF 16-10%, grade 1 DD, HTN, HL, Untreated OSA, CKD stage III, RA.  Pt lives in [place]. Hx of dietary non-compliance.  Previously seen by  Dr. Harrington Challenger 02/04/16 with significant volume overload.  Transitioned from lasix to torsemide 20 mg BID. Weight noted to be up at follow up 02/18/16. Admitted to [facility] for IV diuresis and for DVT evaluation with unilateral leg pain.   Pertinent labs on admission include K 3.9, Cr 1.44, BNP 99.9. CXR 02/19/16 with slightly increased pulmonary vascular congestion without overt edema. Low inspiratory volumes with mild bibasilar subsegmental atelectasis.  CT left foot negative for gout changes. + OA changes. Dependent edema present.   Feeling better than admit.  Says her left foot is feeling much better, was able to walk the halls.  Did get SOB walking halls with 02 sats dropping.  She hasn't worn her CPAP in >8 months. "Can't stand" the mask.  States she drinks around 30 oz of fluid daily and watches salt.  She is in independent living so meals are provided. Does not add salt, avoids high salt foods. She states she has been sleeping much better here with 02 on.  Denies orthopnea.  No SOB with changing clothes or bathing at home.   She is down 6 lbs from admission with 3.1 L of diuresis. Creatinine 1.38 -> 1.5 -> 1.68. Baseline creatinine 1.3-1.6, though BUN climbing as well.   Review of Systems: [y] = yes, [ ]  = no   General: Weight gain [y]; Weight loss [ ] ; Anorexia [ ] ; Fatigue [ ] ; Fever [ ] ; Chills [ ] ; Weakness [ ]   Cardiac: Chest pain/pressure [ ] ; Resting SOB [ ] ; Exertional SOB [y]; Orthopnea [ ] ; Pedal Edema [ ] ; Palpitations [ ] ; Syncope [ ] ; Presyncope [ ] ; Paroxysmal nocturnal  dyspnea[ ]   Pulmonary: Cough [y]; Wheezing[ ] ; Hemoptysis[ ] ; Sputum [ ] ; Snoring [ ]   GI: Vomiting[ ] ; Dysphagia[ ] ; Melena[ ] ; Hematochezia [ ] ; Heartburn[ ] ; Abdominal pain [ ] ; Constipation [ ] ; Diarrhea [ ] ; BRBPR [ ]   GU: Hematuria[ ] ; Dysuria [ ] ; Nocturia[ ]   Vascular: Pain in legs with walking [ ] ; Pain in feet with lying flat [ ] ; Non-healing sores [ ] ; Stroke [ ] ; TIA [ ] ; Slurred speech [ ] ;  Neuro: Headaches[ ] ; Vertigo[ ] ; Seizures[ ] ; Paresthesias[ ] ;Blurred vision [ ] ; Diplopia [ ] ; Vision changes [ ]   Ortho/Skin: Arthritis [y]; Joint pain [y]; Muscle pain [ ] ; Joint swelling [ ] ; Back Pain [y]; Rash [ ]   Psych: Depression[ ] ; Anxiety[ ]   Heme: Bleeding problems [ ] ; Clotting disorders [ ] ; Anemia [ ]   Endocrine: Diabetes [y]; Thyroid dysfunction[ ]   Home Medications Prior to Admission medications   Medication Sig Start Date End Date Taking? Authorizing Provider  allopurinol (ZYLOPRIM) 300 MG tablet Take 300 mg by mouth at bedtime.  02/13/16  Yes Historical Provider, MD  aspirin EC 81 MG tablet Take 81 mg by mouth daily.   Yes Historical Provider, MD  calcium carbonate (TUMS EX) 750 MG chewable tablet Chew 1 tablet by mouth daily.   Yes Historical Provider, MD  carvedilol (COREG CR) 80 MG 24 hr capsule Take 1 capsule (80  mg total) by mouth daily. 06/25/15  Yes Fay Records, MD  chlorhexidine (PERIDEX) 0.12 % solution Use as directed 5 mLs in the mouth or throat 2 (two) times daily.  01/17/15  Yes Historical Provider, MD  cholecalciferol (VITAMIN D) 1000 units tablet Take 3,000 Units by mouth daily.   Yes Historical Provider, MD  ezetimibe-simvastatin (VYTORIN) 10-40 MG per tablet Take 1 tablet by mouth daily.    Yes Historical Provider, MD  folic acid (FOLVITE) 573 MCG tablet Take 400 mcg by mouth daily.   Yes Historical Provider, MD  insulin aspart (NOVOLOG) 100 UNIT/ML injection Inject 35 Units into the skin 4 (four) times daily.    Yes Historical Provider, MD  Insulin Detemir  (LEVEMIR) 100 UNIT/ML Pen Inject 80 Units into the skin 2 (two) times daily.    Yes Historical Provider, MD  magnesium oxide (MAG-OX) 400 MG tablet Take 400 mg by mouth daily.   Yes Historical Provider, MD  potassium chloride SA (K-DUR,KLOR-CON) 20 MEQ tablet Take 20 mEq by mouth daily.   Yes Historical Provider, MD  telmisartan (MICARDIS) 20 MG tablet Take 20 mg by mouth daily.   Yes Historical Provider, MD  torsemide (DEMADEX) 20 MG tablet Take 1 tablet (20 mg total) by mouth 2 (two) times daily. 02/04/16 05/04/16 Yes Fay Records, MD  Vitamin D, Ergocalciferol, (DRISDOL) 50000 UNITS CAPS capsule Take 50,000 Units by mouth every Wednesday.    Yes Historical Provider, MD    Past Medical History: Past Medical History:  Diagnosis Date  . Arthritis    "hips before they were replaced, right knee now" (11/09/2015)  . Chronic diastolic (congestive) heart failure    a. echo 06/2015: EF 65-70%, Grade 1 DD, mitral valve calcification.  . CKD (chronic kidney disease), stage III   . Depression   . H/O cardiac catheterization    a. h/o cardiac catheterization 05/2013 in [place] showing only minimal CAD with increased filling pressure.  . Hyperlipemia   . Hypertensive heart disease   . Morbid obesity ([place])   . Palsy, Bell's   . RA (rheumatoid arthritis) ([place])   . Sleep apnea    "suppose to wear mask; couldn't" (11/09/2015)  . Type II diabetes mellitus ([place])     Past Surgical History: Past Surgical History:  Procedure Laterality Date  . APPENDECTOMY  1966  . CARDIAC CATHETERIZATION  05/2013  . CARPAL TUNNEL RELEASE Right 08/08/2014   Procedure: OPEN CARPAL TUNNEL RELEASE;  Surgeon: Roseanne Kaufman, MD;  Location: [facility];  Service: Orthopedics;  Laterality: Right;  . CATARACT EXTRACTION W/ INTRAOCULAR LENS  IMPLANT, BILATERAL Bilateral   . CHOLECYSTECTOMY OPEN  1966  . COLONOSCOPY    . FRACTURE SURGERY    . JOINT REPLACEMENT    . KNEE ARTHROPLASTY Left   . ORIF WRIST FRACTURE Right 08/08/2014    Procedure: Right OPEN REDUCTION INTERNAL FIXATION (ORIF) WRIST FRACTURE WITH REPAIR AND RECONST/ALLOGRAFT AND BONE GRAFT;  Surgeon: Roseanne Kaufman, MD;  Location: [place];  Service: Orthopedics;  Laterality: Right;  . TOTAL HIP ARTHROPLASTY Bilateral     Family History: Family History  Problem Relation Age of Onset  . Sudden death Mother   . Stroke Mother   . Hypertension Neg Hx     Social History: Social History   Social History  . Marital status: Widowed    Spouse name: N/A  . Number of children: N/A  . Years of education: N/A   Social History Main Topics  . Smoking status:  Never Smoker  . Smokeless tobacco: Never Used  . Alcohol use 0.0 oz/week     Comment: 11/09/2015 "might have glass of champaign on special occasion"  . Drug use: No  . Sexual activity: No   Other Topics Concern  . None   Social History Narrative  . None    Allergies:  No Known Allergies  Objective:    Vital Signs:   Temp:  [97.8 F (36.6 C)-98.2 F (36.8 C)] 98.2 F (36.8 C) (11/09 1151) Pulse Rate:  [62-74] 64 (11/09 1151) Resp:  [18-20] 20 (11/09 1151) BP: (107-125)/(44-56) 125/53 (11/09 1151) SpO2:  [89 %-97 %] 90 % (11/09 1151) Weight:  [245 lb 14.4 oz (111.5 kg)] 245 lb 14.4 oz (111.5 kg) (11/09 0443) Last BM Date: 02/20/16  Weight change: Filed Weights   02/19/16 0520 02/20/16 0509 02/21/16 0443  Weight: 250 lb 9.6 oz (113.7 kg) 244 lb 14.4 oz (111.1 kg) 245 lb 14.4 oz (111.5 kg)    Intake/Output:   Intake/Output Summary (Last 24 hours) at 02/21/16 1200 Last data filed at 02/21/16 1152  Gross per 24 hour  Intake             1200 ml  Output             1900 ml  Net             -700 ml     Physical Exam: General:  Elderly appearing. Obese. NAD HEENT: Normal. 02 via [place] Neck: supple. JVP difficult to assess, appears 7-8 cm.Carotids 2+ bilat; no bruits. No lymphadenopathy or thyromegaly noted.  Cor: PMI nondisplaced.  No rubs, gallops, or murmurs appreciated.  Lungs:  Distant.  ? Scant expiratory wheeze.  Abdomen: Obese, soft, NT, ND, no HSM. No bruits or masses. +BS Extremities: no cyanosis, clubbing, rash. Trace ankle edema. Neuro: alert & orientedx3, cranial nerves grossly intact. moves all 4 extremities w/o difficulty. Affect pleasant  Telemetry: Reviewed, NSR 60-70s  Labs: Basic Metabolic Panel:  Recent Labs Lab 02/18/16 1632 02/19/16 0311 02/20/16 0300 02/21/16 0314  NA 140 144 140 139  K 3.9 3.6 4.2 4.4  CL 102 104 104 100*  CO2 29 30 27 29   GLUCOSE 90 51* 176* 227*  BUN 20 20 26* 40*  CREATININE 1.44* 1.38* 1.54* 1.68*  CALCIUM 9.2 8.9 8.7* 8.9    Liver Function Tests:  Recent Labs Lab 02/18/16 1632  AST 19  ALT 18  ALKPHOS 80  BILITOT 0.6  PROT 6.7  ALBUMIN 3.2*   No results for input(s): LIPASE, AMYLASE in the last 168 hours. No results for input(s): AMMONIA in the last 168 hours.  CBC:  Recent Labs Lab 02/18/16 1632  WBC 11.4*  NEUTROABS 6.9  HGB 11.3*  HCT 36.3  MCV 98.9  PLT 225    Cardiac Enzymes: No results for input(s): CKTOTAL, CKMB, CKMBINDEX, TROPONINI in the last 168 hours.  BNP: BNP (last 3 results)  Recent Labs  10/01/15 0845 11/07/15 1551 02/18/16 1632  BNP 45.6 89.1 99.9    ProBNP (last 3 results) No results for input(s): PROBNP in the last 8760 hours.   CBG:  Recent Labs Lab 02/20/16 1201 02/20/16 1636 02/20/16 2103 02/21/16 0558 02/21/16 1129  GLUCAP 253* 354* 322* 164* 186*    Coagulation Studies:  Recent Labs  02/18/16 1632  LABPROT 14.0  INR 1.08    Other results: EKG: 02/18/16 NSR 76 bpm, LAFB  Imaging: Ct Foot Left Wo Contrast  Result Date: 02/20/2016 CLINICAL  DATA:  Left foot pain which is worst about the great toe in patient with history of gout. No known injury. EXAM: CT OF THE LEFT FOOT WITHOUT CONTRAST TECHNIQUE: Multidetector CT imaging of the left foot was performed according to the standard protocol. Multiplanar CT image reconstructions were  also generated. COMPARISON:  None. FINDINGS: Bones/Joint/Cartilage No acute bony or joint abnormality is identified. No evidence of gouty arthropathy is seen. The patient has midfoot osteoarthritis with joint space narrowing, subchondral sclerosis and osteophytosis worst about the second and third tarsometatarsal joints. Moderate first MTP osteoarthritis is also seen with an osteophyte off the inferior margin of the first metatarsal noted. No erosion or periostitis is identified. Ligaments Suboptimally assessed by CT. Muscles and Tendons Intact and unremarkable. Soft tissues Mild subcutaneous edema is seen over the dorsum of the foot. IMPRESSION: No acute abnormality.  Negative for evidence of gout. Midfoot and first MTP osteoarthritis. Mild subcutaneous edema over the dorsum of the foot is likely due to dependent change. Electronically Signed   By: Inge Rise M.D.   On: 02/20/2016 09:38      Medications:     Current Medications: . allopurinol  300 mg Oral Daily  . aspirin EC  81 mg Oral Daily  . calcium carbonate  1 tablet Oral Daily  . carvedilol  80 mg Oral Daily  . chlorhexidine  5 mL Mouth/Throat BID  . cholecalciferol  3,000 Units Oral Daily  . enoxaparin (LOVENOX) injection  40 mg Subcutaneous Q24H  . ezetimibe-simvastatin  1 tablet Oral Daily  . folic acid  408 mcg Oral Daily  . insulin aspart  0-15 Units Subcutaneous TID WC  . insulin aspart  0-5 Units Subcutaneous QHS  . insulin aspart  23 Units Subcutaneous TID WC  . insulin detemir  80 Units Subcutaneous BID  . irbesartan  75 mg Oral Daily  . magnesium oxide  400 mg Oral Daily  . predniSONE  50 mg Oral Q breakfast  . sodium chloride flush  3 mL Intravenous Q12H  . Vitamin D (Ergocalciferol)  50,000 Units Oral Q Wed     Infusions:    Assessment/Plan   Brandi Melton is a 82 y.o. female with hx of Chronic diastolic CHF, HTN, HL, sleep apnea, CKD III, and RA/OA.  Admitted with volume overload.   1. Acute on chronic  diastolic CHF - LVEF 14-48% - Volume status looks stable on exam.  Would resume po lasix tomorrow if Creatinine stable, else wait until Saturday.  - RV and PA pressures OK by Echo 06/2015. 2. Acute on chronic respiratory failure - 02 desats with walking.  Suspect due to CPAP noncompliance +/- component of OHS. - will need 02 for home - Will need to be set up with pulmonary.  3. OSA/OHS - Encouraged to try CPAP, she is currently refusing 4. Suspected acute gout - On prednisone. Would limit use with diastolic CHF.  Likely needs to be on chronic suppressive agent like allopurinol once flare has past.  - Should follow up with PCP soon after d/c.   Volume status improved, creatinine with uptrend. Would resume torsemide tomorrow am if creatinine stable. OK to wait until Saturday if needed.    Think large component of dyspnea is OHS/OSA with CPAP non-compliance.  Getting on 02 should help, but worry about long term prognosis off CPAP.   Length of Stay: 3  Shirley Friar PA-C 02/21/2016, 12:00 PM  Advanced Heart Failure Team Pager [phone] (M-F; 7a - 4p)  Please contact [facility] for night-coverage after hours (4p -7a ) and weekends on [email or web address]  Patient seen and examined with Oda Kilts, PA-C. We discussed all aspects of the encounter. I agree with the assessment and plan as stated above.   Echo images reviewed personally. Volume status looks good after diuresis. Agree with current regimen. Suspect obesity/OHS is playing a major role here. No significant PAH on echo.   Ilyse Tremain,MD 3:31 PM

## 2016-02-22 ENCOUNTER — Telehealth: Payer: Self-pay | Admitting: Internal Medicine

## 2016-02-22 ENCOUNTER — Other Ambulatory Visit: Payer: Self-pay | Admitting: Student

## 2016-02-22 DIAGNOSIS — R0609 Other forms of dyspnea: Principal | ICD-10-CM

## 2016-02-22 DIAGNOSIS — G4733 Obstructive sleep apnea (adult) (pediatric): Secondary | ICD-10-CM

## 2016-02-22 DIAGNOSIS — G4730 Sleep apnea, unspecified: Secondary | ICD-10-CM

## 2016-02-22 DIAGNOSIS — R0600 Dyspnea, unspecified: Secondary | ICD-10-CM

## 2016-02-22 LAB — GLUCOSE, CAPILLARY
Glucose-Capillary: 126 mg/dL — ABNORMAL HIGH (ref 65–99)
Glucose-Capillary: 80 mg/dL (ref 65–99)

## 2016-02-22 LAB — BASIC METABOLIC PANEL
Anion gap: 7 (ref 5–15)
BUN: 42 mg/dL — ABNORMAL HIGH (ref 6–20)
CO2: 30 mmol/L (ref 22–32)
Calcium: 9.1 mg/dL (ref 8.9–10.3)
Chloride: 104 mmol/L (ref 101–111)
Creatinine, Ser: 1.4 mg/dL — ABNORMAL HIGH (ref 0.44–1.00)
GFR calc Af Amer: 39 mL/min — ABNORMAL LOW (ref >60)
GFR calc non Af Amer: 34 mL/min — ABNORMAL LOW (ref >60)
Glucose, Bld: 133 mg/dL — ABNORMAL HIGH (ref 65–99)
Potassium: 4.8 mmol/L (ref 3.5–5.1)
Sodium: 141 mmol/L (ref 135–145)

## 2016-02-22 MED ORDER — TORSEMIDE 20 MG PO TABS: 40.0000 mg | ORAL_TABLET | Freq: Every day | ORAL | 6 refills | Status: DC

## 2016-02-22 MED ORDER — PREDNISONE 50 MG PO TABS: 50.0000 mg | ORAL_TABLET | Freq: Every day | ORAL | 0 refills | Status: DC

## 2016-02-22 NOTE — Care Management Important Message (Signed)
Important Message  Patient Details  Name: KUMARI SCULLEY MRN: [id number] Date of Birth: 09-Aug-1930   Medicare Important Message Given:  Yes    Leelyn Jasinski 02/22/2016, 11:10 AM

## 2016-02-22 NOTE — Progress Notes (Addendum)
Patient Name: Brandi Melton Date of Encounter: 02/22/2016  Primary Cardiologist: Dorris Carnes, M.D.  Hospital Problem List     Principal Problem:   Acute on chronic diastolic CHF (congestive heart failure) (HCC) Active Problems:   Diabetes mellitus type 2, insulin dependent ([place])   Hypertension   Hyperlipemia   Sleep apnea   CKD (chronic kidney disease), stage III   Gout   Acute idiopathic gout     Subjective   Very tearful and upset after an argument with one of the nursing techs.  Her leg feels much better and she is able ambulate. Still has dyspnea and O2 desaturation with ambulation. Appreciate advanced heart failure service note.  Inpatient Medications    Scheduled Meds: . allopurinol  300 mg Oral Daily  . aspirin EC  81 mg Oral Daily  . calcium carbonate  1 tablet Oral Daily  . carvedilol  80 mg Oral Daily  . chlorhexidine  5 mL Mouth/Throat BID  . cholecalciferol  3,000 Units Oral Daily  . enoxaparin (LOVENOX) injection  40 mg Subcutaneous Q24H  . ezetimibe-simvastatin  1 tablet Oral Daily  . folic acid  856 mcg Oral Daily  . insulin aspart  0-15 Units Subcutaneous TID WC  . insulin aspart  0-5 Units Subcutaneous QHS  . insulin aspart  26 Units Subcutaneous TID WC  . insulin detemir  80 Units Subcutaneous BID  . irbesartan  75 mg Oral Daily  . magnesium oxide  400 mg Oral Daily  . sodium chloride flush  3 mL Intravenous Q12H  . vitamin C  500 mg Oral Daily  . Vitamin D (Ergocalciferol)  50,000 Units Oral Q Wed   Continuous Infusions:  PRN Meds: sodium chloride, acetaminophen, alum & mag hydroxide-simeth, ondansetron (ZOFRAN) IV, oxyCODONE, sodium chloride flush   Vital Signs    Vitals:   02/21/16 2140 02/22/16 0307 02/22/16 0431 02/22/16 0900  BP: 115/60  (!) 129/51 121/67  Pulse: 70  62 64  Resp:   18 19  Temp: 98.1 F (36.7 C)  98.1 F (36.7 C)   TempSrc: Oral  Oral   SpO2: 97%  95% 95%  Weight:  254 lb 14.4 oz (115.6 kg)    Height:         Intake/Output Summary (Last 24 hours) at 02/22/16 0939 Last data filed at 02/22/16 0849  Gross per 24 hour  Intake              600 ml  Output             2100 ml  Net            -1500 ml   Filed Weights   02/20/16 0509 02/21/16 0443 02/22/16 0307  Weight: 244 lb 14.4 oz (111.1 kg) 245 lb 14.4 oz (111.5 kg) 254 lb 14.4 oz (115.6 kg)    Physical Exam    GEN: Morbidly obese. in no acute distress. Distraught and tearful HEENT: Grossly normal.  Neck: Supple, no JVD, carotid bruits, or masses. Cardiac: RRR, no murmurs, rubs, or gallops. No clubbing, cyanosis, edema.  Radials/DP/PT 2+ and equal bilaterally.  Respiratory:  Respirations regular and unlabored, clear to auscultation bilaterally. GI: Soft, nontender, nondistended, BS + x 4. MS: no deformity or atrophy. Skin: warm and dry, no rash. Neuro:  Strength and sensation are intact. Psych: Anxious. Tearful.  Labs    CBC No results for input(s): WBC, NEUTROABS, HGB, HCT, MCV, PLT in the last 72 hours. Basic Metabolic  Panel  Recent Labs  02/21/16 0314 02/22/16 0406  NA 139 141  K 4.4 4.8  CL 100* 104  CO2 29 30  GLUCOSE 227* 133*  BUN 40* 42*  CREATININE 1.68* 1.40*  CALCIUM 8.9 9.1   Hemoglobin A1C  Recent Labs  02/19/16 1212  HGBA1C 7.2*   Fasting Lipid Panel No results for input(s): CHOL, HDL, LDLCALC, TRIG, CHOLHDL, LDLDIRECT in the last 72 hours. Thyroid Function Tests No results for input(s): TSH, T4TOTAL, T3FREE, THYROIDAB in the last 72 hours.  Invalid input(s): FREET3  Telemetry    Normal sinus rhythm - Personally Reviewed  ECG    No new tracing performed today.   Radiology    No results found.  Cardiac Studies   No new cardiac data  Patient Profile     82 year old with chronic diastolic heart failure who presents with acute on chronic volume overload, history of 2diabetes, nonobstructive CAD by cath 2015, essential hypertension, hyperlipidemia, sleep apnea, and history of both  rheumatoid arthritis and gout. Admitted from our office on 02/18/16 because of a failure to diurese with up titration of diuretic regimen over the preceding 3 weeks. Her major complaint on admission however was left leg pain that she feels is related to gout. Known chronic kidney disease.  Assessment & Plan    1. Acute on chronic diastolic heart failure , -4.5 L since admission. Today's weight is 254 pounds. Yesterday's weight 245 pounds. She was -800 cc yesterday so this data makes no sense. Today's weight is wrong. Plan is to discharge today on torsemide 40 mg daily. Other medications will be unchanged compared to admission with the exception of prednisone which will be discontinued after tomorrow as dose of 50 mg.  She needs to establish with primary care physician  She needs a transition of care follow-up in 7 days  She needs basic metabolic panel performed at the time of follow-up  Discharge today with home O2  Establish with pulmonology for sleep apnea management and recommendations. This is likely the driving issue as far as her dyspnea and fluid retention.  Continue allopurinol 300 mg daily 2. Suspected acute gout improved after adding prednisone.discontinue prednisone after tomorrow's dose 3. Acute on Chronic kidney disease stage 3-4:  kidney function is back within her typical range of 1.3-1.6. Diuretics will be resumed today.  Signed, Sinclair Grooms, MD  02/22/2016, 9:39 AM

## 2016-02-22 NOTE — Discharge Summary (Signed)
Discharge Summary    Patient ID: Brandi Melton,  MRN: [id number], DOB/AGE: Jul 08, 1930 82 y.o.  Admit date: 02/18/2016 Discharge date: 02/22/2016  Primary Care Provider: Fransico Him Tisovec Primary Cardiologist: Dr. Harrington Challenger  Discharge Diagnoses    Principal Problem:   Acute on chronic diastolic CHF (congestive heart failure) ([place]) Active Problems:   Diabetes mellitus type 2, insulin dependent ([place])   Hypertension   Hyperlipemia   Sleep apnea   CKD (chronic kidney disease), stage III   Gout   Acute idiopathic gout   History of Present Illness     Brandi Melton is a 82 y.o. female with past medical history of chronic diastolic CHF, HTN, HLD, OSA (noncompliant on CPAP), and Stage 3 CKD who presented to [facility] on [facility]/18/2016 as a direct admission for the office for acute heart failure exacerbation.  She reported not feeling well for the past several days. Reported significant leg pain along with orthopnea, dyspnea, and lower extremity edema. Weight was up 4 lbs from baseline.   With her significant volume overload by physical exam, she was admitted on started on IV diuresis with Lasix 60mg  BID.  Hospital Course     Consultants: Internal Medicine   Her main concern the following morning was her left toe pain. She reported a history of gout and with her Stage 3 CKD, Internal Medicine was consulted to assist with this. She was started on a Prednisone burst of 50mg  daily for 4 days. NSAIDS and Colchicine were avoided with her renal insufficiency.   She continued to diurese well with weight going down 7 lbs in the first two days of receiving IV Lasix. She did have oxygen desaturations into the 80's with ambulation. She was evaluated by the Advanced Heart Failure team and felt to have reached baseline volume status on 11/10. They recommended for the patient to see Pulmonology as an outpatient, for her untreated OSA was felt to be playing a role in her presenting symptoms.   On 11/10, she  reported feeling significantly better. Weight was down to 245 lbs with a recorded net output of -4.4L. Was last examined by Dr. Tamala Julian and deemed stable for discharge. She will be on Torsemide 40mg  daily at the time of discharge. Rx was provided for 1 dose of Prednisone to finish her steroid course for the acute gout flare. A staff message was sent to the Patient Care Coordinator to arrange for a Pulmonology consult. A 7-day TOC appointment has been arranged and she will need a repeat BMET at that time.   Case Management's assistance was appreciated in helping to arrange her home oxygen. This was to be delivered to her room prior to discharge, however she left prior to this due to a hair appointment and her daughter saying they needed to leave immediately (as documented in progress note by patient's nurse on 11/10). She was discharged home in stable condition.   _____________  Discharge Vitals Blood pressure 121/67, pulse 64, temperature 98.1 F (36.7 C), temperature source Oral, resp. rate 19, height 5\' 3"  (1.6 m), weight 254 lb 14.4 oz (115.6 kg), SpO2 95 %.  Filed Weights   02/20/16 0509 02/21/16 0443 02/22/16 0307  Weight: 244 lb 14.4 oz (111.1 kg) 245 lb 14.4 oz (111.5 kg) 254 lb 14.4 oz (115.6 kg)    Labs & Radiologic Studies     CBC No results for input(s): WBC, NEUTROABS, HGB, HCT, MCV, PLT in the last 72 hours. Basic Metabolic Panel  Recent Labs  02/21/16 0314 02/22/16 0406  NA 139 141  K 4.4 4.8  CL 100* 104  CO2 29 30  GLUCOSE 227* 133*  BUN 40* 42*  CREATININE 1.68* 1.40*  CALCIUM 8.9 9.1   Liver Function Tests No results for input(s): AST, ALT, ALKPHOS, BILITOT, PROT, ALBUMIN in the last 72 hours. No results for input(s): LIPASE, AMYLASE in the last 72 hours. Cardiac Enzymes No results for input(s): CKTOTAL, CKMB, CKMBINDEX, TROPONINI in the last 72 hours. BNP Invalid input(s): POCBNP D-Dimer No results for input(s): DDIMER in the last 72 hours. Hemoglobin  A1C No results for input(s): HGBA1C in the last 72 hours. Fasting Lipid Panel No results for input(s): CHOL, HDL, LDLCALC, TRIG, CHOLHDL, LDLDIRECT in the last 72 hours. Thyroid Function Tests No results for input(s): TSH, T4TOTAL, T3FREE, THYROIDAB in the last 72 hours.  Invalid input(s): FREET3  Dg Chest 2 View  Result Date: 02/19/2016 CLINICAL DATA:  82 year old female with a 2 day history of shortness of breath. Past medical history includes CHF. EXAM: CHEST  2 VIEW COMPARISON:  Prior chest x-ray 11/08/2015 FINDINGS: Slightly increased pulmonary vascular congestion without overt pulmonary edema. No pleural effusion, focal airspace consolidation or pneumothorax. Lower inspiratory volumes with slightly increased bibasilar subsegmental atelectasis. Stable cardiac and mediastinal contours. Atherosclerotic calcifications present in the transverse aorta. IMPRESSION: 1. Slightly increased pulmonary vascular congestion without overt edema. 2. Lower inspiratory volumes with mild bibasilar subsegmental atelectasis. 3.  Aortic Atherosclerosis (ICD10-170.0). Electronically Signed   By: Jacqulynn Cadet M.D.   On: 02/19/2016 09:44   Ct Foot Left Wo Contrast  Result Date: 02/20/2016 CLINICAL DATA:  Left foot pain which is worst about the great toe in patient with history of gout. No known injury. EXAM: CT OF THE LEFT FOOT WITHOUT CONTRAST TECHNIQUE: Multidetector CT imaging of the left foot was performed according to the standard protocol. Multiplanar CT image reconstructions were also generated. COMPARISON:  None. FINDINGS: Bones/Joint/Cartilage No acute bony or joint abnormality is identified. No evidence of gouty arthropathy is seen. The patient has midfoot osteoarthritis with joint space narrowing, subchondral sclerosis and osteophytosis worst about the second and third tarsometatarsal joints. Moderate first MTP osteoarthritis is also seen with an osteophyte off the inferior margin of the first metatarsal  noted. No erosion or periostitis is identified. Ligaments Suboptimally assessed by CT. Muscles and Tendons Intact and unremarkable. Soft tissues Mild subcutaneous edema is seen over the dorsum of the foot. IMPRESSION: No acute abnormality.  Negative for evidence of gout. Midfoot and first MTP osteoarthritis. Mild subcutaneous edema over the dorsum of the foot is likely due to dependent change. Electronically Signed   By: Inge Rise M.D.   On: 02/20/2016 09:38     Diagnostic Studies/Procedures    None Performed.   Disposition   Pt is being discharged home today in good condition.  Follow-up Plans & Appointments    Follow-up Information    Ermalinda Barrios, PA-C Follow up on 03/03/2016.   Specialty:  Cardiology Why:  Cardiology Hospital Follow-Up on 03/03/2016 at 8:45AM. (Office of Dr. Harrington Challenger) Contact information: [address] [place] [address] [phone]          Discharge Instructions    Diet - low sodium heart healthy    Complete by:  As directed       Discharge Medications     Medication List    TAKE these medications   allopurinol 300 MG tablet Commonly known as:  ZYLOPRIM Take  300 mg by mouth at bedtime.   aspirin EC 81 MG tablet Take 81 mg by mouth daily.   calcium carbonate 750 MG chewable tablet Commonly known as:  TUMS EX Chew 1 tablet by mouth daily.   carvedilol 80 MG 24 hr capsule Commonly known as:  COREG CR Take 1 capsule (80 mg total) by mouth daily.   chlorhexidine 0.12 % solution Commonly known as:  PERIDEX Use as directed 5 mLs in the mouth or throat 2 (two) times daily.   cholecalciferol 1000 units tablet Commonly known as:  VITAMIN D Take 3,000 Units by mouth daily.   ezetimibe-simvastatin 10-40 MG tablet Commonly known as:  VYTORIN Take 1 tablet by mouth daily.   folic acid 861 MCG tablet Commonly known as:  FOLVITE Take 400 mcg by mouth daily.   Insulin Detemir 100 UNIT/ML Pen Commonly known as:   LEVEMIR Inject 80 Units into the skin 2 (two) times daily.   magnesium oxide 400 MG tablet Commonly known as:  MAG-OX Take 400 mg by mouth daily.   NOVOLOG 100 UNIT/ML injection Generic drug:  insulin aspart Inject 35 Units into the skin 4 (four) times daily.   potassium chloride SA 20 MEQ tablet Commonly known as:  K-DUR,KLOR-CON Take 20 mEq by mouth daily.   predniSONE 50 MG tablet Commonly known as:  DELTASONE Take 1 tablet (50 mg total) by mouth daily with breakfast. Start taking on:  02/23/2016   telmisartan 20 MG tablet Commonly known as:  MICARDIS Take 20 mg by mouth daily.   torsemide 20 MG tablet Commonly known as:  DEMADEX Take 2 tablets (40 mg total) by mouth daily. What changed:  how much to take  when to take this   Vitamin D (Ergocalciferol) 50000 units Caps capsule Commonly known as:  DRISDOL Take 50,000 Units by mouth every Wednesday.         Allergies No Known Allergies   Outstanding Labs/Studies   BMET at follow-up appointment.  Duration of Discharge Encounter   Greater than 30 minutes including physician time.  Signed, Erma Heritage, PA-C 02/22/2016, 1:10 PM The patient has been seen in conjunction with [place] M. Strader, PA. All aspects of care have been considered and discussed. The patient has been personally interviewed, examined, and all clinical data has been reviewed.   Please see the previous note by me on the day of discharge.

## 2016-02-22 NOTE — Progress Notes (Signed)
Pt extremely agitated and anxious regarding waiting for oxygen to be delivered to room prior to discharge. Pt stated "I have a hair appointment at 22 and now I will be late, I want to leave. I don't need oxygen". I instructed pt and pt daughter that it is important for the pt health that she has oxygen at home while ambulating and risk for readmission is high if pt chooses to leave hospital without oxygen. Pt desat to 85% on RA while ambulating today. Pt daughter pacing in hallway states "well she will be back readmitted in the hospital anyway so it doesn't matter. I have to leave town at [employer]. I have 8 kids, I am very busy, we have to leave." I reassured pt daughter oxygen will very likely be delivered prior to 6:30 pm and that [facility] has already been called. Pt and pt daughter still unwilling to wait. Volunteer service called for transport out.

## 2016-02-22 NOTE — Telephone Encounter (Signed)
TOC Phone Call.Marland Kitchen Appt on 03/03/16 at 8:45am w/ Estella Husk .Marland Kitchen Thanks

## 2016-02-22 NOTE — Telephone Encounter (Signed)
Planned pt dc today per Dr. Tamala Julian.  TCM 02/25/16.

## 2016-02-22 NOTE — Progress Notes (Signed)
[facility] called for home oxygen to be delivered to the room today prior to discharging home today. Mindi Slicker RN,MHA,BSN

## 2016-02-22 NOTE — Progress Notes (Signed)
Pt walked 50 feet with nursing students, pt O2 monitored continuously. Pt O2 on room air dropped to 89% then went back up to 94%. Pt heart rate 84. Pt now resting in bed.

## 2016-02-22 NOTE — Progress Notes (Signed)
SATURATION QUALIFICATIONS: (This note is used to comply with regulatory documentation for home oxygen)  Patient Saturations on Room Air at Rest = 91%  Patient Saturations on Room Air while Ambulating = 85-89%  Patient Saturations on 2 Liters of oxygen while Ambulating = 91%  Please briefly explain why patient needs home oxygen: While ambulating 6 min pt had to take frequent resting breaks where oxygen would return 89-91% on RA however pt did desat to 85% once when walking but oxygen quickly returned to baseline when resting.

## 2016-02-22 NOTE — Progress Notes (Signed)
Physical Therapy Treatment Patient Details Name: Brandi Melton MRN: [id number] DOB: 1930/04/25 Today's Date: 02/22/2016    History of Present Illness Pt is an 82 y/o female admitted secondary to volume overload. PMH including but not limited to HTN, CKD and CHF.    PT Comments    Pt presented supine in bed with HOB elevated, awake and willing to participate in therapy session. Pt making good progress towards achieving her functional goals. This session pt participated in a 6-minute walk test on RA with RW and min guard. She ambulated a total of 150' with multiple standing rest breaks. Her SPO2 was monitored throughout with a decrease to 88% x3 during ambulation and immediately after ambulation at rest, her SPO2 dropped to 85%. However, it increased back up to 94% after approximately 10 seconds of rest. Pt would continue to benefit from skilled physical therapy services at this time while admitted and after d/c to address her limitations in order to improve her overall safety and independence with functional mobility.   Follow Up Recommendations  Supervision for mobility/OOB     Equipment Recommendations  None recommended by PT    Recommendations for Other Services       Precautions / Restrictions Precautions Precautions: Fall Restrictions Weight Bearing Restrictions: No    Mobility  Bed Mobility Overal bed mobility: Needs Assistance Bed Mobility: Supine to Sit;Sit to Supine     Supine to sit: Min guard;HOB elevated Sit to supine: Min assist   General bed mobility comments: pt required increased time and min guard for safety to achieve sitting EOB. When returning to supine, pt required min A with bilateral LEs.  Transfers Overall transfer level: Needs assistance Equipment used: Rolling walker (2 wheeled) Transfers: Sit to/from Stand Sit to Stand: Min guard         General transfer comment: pt required increased time and min guard for  safety  Ambulation/Gait Ambulation/Gait assistance: Min guard Ambulation Distance (Feet): 150 Feet Assistive device: Rolling walker (2 wheeled) Gait Pattern/deviations: Step-through pattern;Decreased stride length;Trunk flexed Gait velocity: decreased   General Gait Details: pt required occasional VC'ing to maintain safe distance from RW. pt also required frequent standing rest breaks secondary to fatigue. During ambulation, pt's SPO2 dropped to 88% x3 but quickly rose back up to 91% with standing rest breaks of ~10 seconds.    Stairs            Wheelchair Mobility    Modified Rankin (Stroke Patients Only)       Balance Overall balance assessment: Needs assistance Sitting-balance support: Feet supported;No upper extremity supported Sitting balance-Leahy Scale: Fair     Standing balance support: During functional activity;Bilateral upper extremity supported Standing balance-Leahy Scale: Poor Standing balance comment: pt reliant on bilateral UEs on RW                    Cognition Arousal/Alertness: Awake/alert Behavior During Therapy: WFL for tasks assessed/performed Overall Cognitive Status: Within Functional Limits for tasks assessed                      Exercises      General Comments General comments (skin integrity, edema, etc.): 6 minute walk test on RA: pt ambulated 150' with RW and min guard, required frequent standing rest breaks secondary to fatigue and decrease in SPO2 to 88% x3      Pertinent Vitals/Pain Pain Assessment: No/denies pain    Home Living  Prior Function            PT Goals (current goals can now be found in the care plan section) Acute Rehab PT Goals Patient Stated Goal: return home PT Goal Formulation: With patient Time For Goal Achievement: 03/05/16 Potential to Achieve Goals: Good Progress towards PT goals: Progressing toward goals    Frequency    Min 3X/week      PT Plan  Current plan remains appropriate    Co-evaluation             End of Session Equipment Utilized During Treatment: Gait belt Activity Tolerance: Patient limited by fatigue Patient left: in bed;with call bell/phone within reach;Other (comment) (with pulse oximeter on for 20 minutes (nurse to monitor))     Time: 1007-1027 PT Time Calculation (min) (ACUTE ONLY): 20 min  Charges:  $Gait Training: 8-22 mins                    G CodesClearnce Sorrel Ponce Skillman 27-Feb-2016, 10:49 AM Sherie Don, PT, DPT [phone]

## 2016-02-25 NOTE — Telephone Encounter (Signed)
Patient contacted regarding discharge from [facility] on 02/22/2016.  Patient understands to follow up with provider Estella Husk, PA-C on 03/03/2016 at [place] at [address] office . Patient understands discharge instructions? Yes  Patient understands medications and regiment? Pt was unaware she was prescribed prednisone.  I advised her the rx was sent to [facility] on [address], she states she will p/u tomorrow. I reviewed the other medications with her and she voiced understanding. Patient understands to bring all medications to this visit? Yes  Patient denied any other questions at this time.

## 2016-02-28 ENCOUNTER — Ambulatory Visit: Payer: Medicare Other | Admitting: Internal Medicine

## 2016-02-29 ENCOUNTER — Encounter: Payer: Self-pay | Admitting: Physician Assistant

## 2016-03-03 ENCOUNTER — Ambulatory Visit (INDEPENDENT_AMBULATORY_CARE_PROVIDER_SITE_OTHER): Payer: Medicare Other | Admitting: Physician Assistant

## 2016-03-03 ENCOUNTER — Encounter: Payer: Self-pay | Admitting: Physician Assistant

## 2016-03-03 VITALS — BP 110/60 | HR 72 | Ht 63.0 in | Wt 250.2 lb

## 2016-03-03 DIAGNOSIS — N183 Chronic kidney disease, stage 3 unspecified: Secondary | ICD-10-CM

## 2016-03-03 DIAGNOSIS — G4730 Sleep apnea, unspecified: Secondary | ICD-10-CM

## 2016-03-03 DIAGNOSIS — Z79899 Other long term (current) drug therapy: Secondary | ICD-10-CM

## 2016-03-03 DIAGNOSIS — I10 Essential (primary) hypertension: Secondary | ICD-10-CM

## 2016-03-03 DIAGNOSIS — I5033 Acute on chronic diastolic (congestive) heart failure: Secondary | ICD-10-CM | POA: Diagnosis not present

## 2016-03-03 DIAGNOSIS — M1000 Idiopathic gout, unspecified site: Secondary | ICD-10-CM

## 2016-03-03 LAB — BASIC METABOLIC PANEL
BUN: 32 mg/dL — ABNORMAL HIGH (ref 7–25)
CALCIUM: 9.1 mg/dL (ref 8.6–10.4)
CO2: 31 mmol/L (ref 20–31)
CREATININE: 1.3 mg/dL — AB (ref 0.60–0.88)
Chloride: 105 mmol/L (ref 98–110)
GLUCOSE: 95 mg/dL (ref 65–99)
Potassium: 4.2 mmol/L (ref 3.5–5.3)
Sodium: 145 mmol/L (ref 135–146)

## 2016-03-03 NOTE — Addendum Note (Signed)
Addended by: Eulis Foster on: 03/03/2016 09:36 AM   Modules accepted: Orders

## 2016-03-03 NOTE — Patient Instructions (Addendum)
Your physician has recommended you make the following change in your medication:  INCREASE  DEMADEX 20 MG   3 TABS  DAILY  FOR  4  DAYS  THEN  RESUME  TO  2  TABS DAILY INCREASE  KCL 20 MEQ  2 TABS  DAILY  FOR  4 DAYS  THEN  RESUME TO  1 TAB  DAILY  Your physician recommends that you return for lab work in: [place] physician recommends that you schedule a follow-up appointment in:     [place]

## 2016-03-03 NOTE — Progress Notes (Signed)
Cardiology Office Note    Date:  03/03/2016   ID:  Brandi Melton Mar 18, 1931, MRN [id number]  PCP:  Brandi Pao, MD  Cardiologist: Dr. Harrington Melton  Chief Complaint  Patient presents with  . Congestive Heart Failure    History of Present Illness:  Brandi Melton is a 82 y.o. female female with past medical history of chronic diastolic CHF, HTN, HLD, OSA (noncompliant on CPAP), and Stage 3 CKD who presented to [facility] on 02/18/2016 as a direct admission for the office for acute heart failure exacerbation.She diuresed and discharge weight was 245 pounds. She was seen by advanced heart failure team and they recommended she see pulmonary as an outpatient for her untreated OSA that was felt to be playing a role in her symptoms. She was also given home oxygen.  Echo 06/2015 LVEF 65-70%.  Patient comes in today accompanied by her daughter. They are concerned because her weight is slowly increasing and she feels like her heart failures returning. She is trying to follow low sodium diet but still struggles with eating frozen foods and canned soups. She was never given oxygen at discharge because they had to wait too long to be seen. She will not see pulmonary or wear CPap. She still complaining of gout pain despite the steroids given in the hospital.     Past Medical History:  Diagnosis Date  . Arthritis    "hips before they were replaced, right knee now" (11/09/2015)  . Chronic diastolic (congestive) heart failure    a. echo 06/2015: EF 65-70%, Grade 1 DD, mitral valve calcification.  . CKD (chronic kidney disease), stage III   . Depression   . H/O cardiac catheterization    a. h/o cardiac catheterization 05/2013 in [place] showing only minimal CAD with increased filling pressure.  . Hyperlipemia   . Hypertensive heart disease   . Morbid obesity ([place])   . Palsy, Bell's   . RA (rheumatoid arthritis) ([place])   . Sleep apnea    "suppose to wear mask; couldn't" (11/09/2015)  . Type II  diabetes mellitus ([place])     Past Surgical History:  Procedure Laterality Date  . APPENDECTOMY  1966  . CARDIAC CATHETERIZATION  05/2013  . CARPAL TUNNEL RELEASE Right 08/08/2014   Procedure: OPEN CARPAL TUNNEL RELEASE;  Surgeon: Roseanne Kaufman, MD;  Location: [facility];  Service: Orthopedics;  Laterality: Right;  . CATARACT EXTRACTION W/ INTRAOCULAR LENS  IMPLANT, BILATERAL Bilateral   . CHOLECYSTECTOMY OPEN  1966  . COLONOSCOPY    . FRACTURE SURGERY    . JOINT REPLACEMENT    . KNEE ARTHROPLASTY Left   . ORIF WRIST FRACTURE Right 08/08/2014   Procedure: Right OPEN REDUCTION INTERNAL FIXATION (ORIF) WRIST FRACTURE WITH REPAIR AND RECONST/ALLOGRAFT AND BONE GRAFT;  Surgeon: Roseanne Kaufman, MD;  Location: [facility];  Service: Orthopedics;  Laterality: Right;  . TOTAL HIP ARTHROPLASTY Bilateral     Current Medications: Outpatient Medications Prior to Visit  Medication Sig Dispense Refill  . allopurinol (ZYLOPRIM) 300 MG tablet Take 300 mg by mouth at bedtime.     Marland Kitchen aspirin EC 81 MG tablet Take 81 mg by mouth daily.    . calcium carbonate (TUMS EX) 750 MG chewable tablet Chew 1 tablet by mouth daily.    . carvedilol (COREG CR) 80 MG 24 hr capsule Take 1 capsule (80 mg total) by mouth daily. 90 capsule 3  . chlorhexidine (PERIDEX) 0.12 % solution Use as directed 5 mLs in  the mouth or throat 2 (two) times daily.     . cholecalciferol (VITAMIN D) 1000 units tablet Take 3,000 Units by mouth daily.    Marland Kitchen ezetimibe-simvastatin (VYTORIN) 10-40 MG per tablet Take 1 tablet by mouth daily.     . folic acid (FOLVITE) 619 MCG tablet Take 400 mcg by mouth daily.    . insulin aspart (NOVOLOG) 100 UNIT/ML injection Inject 35 Units into the skin 4 (four) times daily.     . Insulin Detemir (LEVEMIR) 100 UNIT/ML Pen Inject 80 Units into the skin 2 (two) times daily.     . magnesium oxide (MAG-OX) 400 MG tablet Take 400 mg by mouth daily.    . potassium chloride SA (K-DUR,KLOR-CON) 20 MEQ tablet Take 40 mEq by mouth  daily. FOR  4 DAYS  THEN  BACK TO 20 MEQ    . telmisartan (MICARDIS) 20 MG tablet Take 20 mg by mouth daily.    Marland Kitchen torsemide (DEMADEX) 20 MG tablet Take 2 tablets (40 mg total) by mouth daily. (Patient taking differently: Take 60 mg by mouth daily. FOR  FOR  4  DAYS THEN  RESUME   TO  40 MG) 60 tablet 6  . Vitamin D, Ergocalciferol, (DRISDOL) 50000 UNITS CAPS capsule Take 50,000 Units by mouth every Wednesday.     . predniSONE (DELTASONE) 50 MG tablet Take 1 tablet (50 mg total) by mouth daily with breakfast. 1 tablet 0   No facility-administered medications prior to visit.      Allergies:   Patient has no known allergies.   Social History   Social History  . Marital status: Widowed    Spouse name: N/A  . Number of children: N/A  . Years of education: N/A   Social History Main Topics  . Smoking status: Never Smoker  . Smokeless tobacco: Never Used  . Alcohol use 0.0 oz/week     Comment: 11/09/2015 "might have glass of champaign on special occasion"  . Drug use: No  . Sexual activity: No   Other Topics Concern  . None   Social History Narrative  . None     Family History:  The patient's family history includes Stroke in her mother; Sudden death in her mother.   ROS:   Please see the history of present illness.    Review of Systems  Constitution: Positive for weakness, malaise/fatigue and weight gain.  HENT: Negative.   Eyes: Negative.   Cardiovascular: Positive for dyspnea on exertion and leg swelling.  Respiratory: Positive for sleep disturbances due to breathing.   Hematologic/Lymphatic: Negative.   Musculoskeletal: Positive for arthritis, gout, joint pain, muscle weakness and myalgias.  Gastrointestinal: Negative.   Genitourinary: Negative.   Neurological: Positive for loss of balance.   All other systems reviewed and are negative.   PHYSICAL EXAM:   VS:  BP 110/60   Pulse 72   Ht 5\' 3"  (1.6 m)   Wt 250 lb 4 oz (113.5 kg)   BMI 44.33 kg/m   Physical Exam    GEN: Obese, in no acute distress  Neck: no JVD, carotid bruits, or masses Cardiac:RRR; no murmurs, rubs, or gallops  Respiratory:  clear to auscultation bilaterally, normal work of breathing GI: soft, nontender, nondistended, + BS Ext: Compression hose in place, +1 edema bilaterally Decreased distal pulses bilaterally MS: no deformity or atrophy  Skin: warm and dry, no rash Psych: euthymic mood, full affect  Wt Readings from Last 3 Encounters:  03/03/16 250 lb 4 oz (  113.5 kg)  02/22/16 254 lb 14.4 oz (115.6 kg)  02/18/16 254 lb (115.2 kg)      Studies/Labs Reviewed:   EKG:  EKG is ordered today.  Recent Labs: 11/07/2015: TSH 1.824 02/04/2016: Magnesium 2.4 02/18/2016: ALT 18; B Natriuretic Peptide 99.9; Hemoglobin 11.3; Platelets 225 02/22/2016: BUN 42; Creatinine, Ser 1.40; Potassium 4.8; Sodium 141   Lipid Panel No results found for: CHOL, TRIG, HDL, CHOLHDL, VLDL, LDLCALC, LDLDIRECT  Additional studies/ records that were reviewed today include:   2-D echo 07/03/15 ------------------------------------------------------------------- Study Conclusions   - Left ventricle: The cavity size was normal. Systolic function was   vigorous. The estimated ejection fraction was in the range of 65%   to 70%. Wall motion was normal; there were no regional wall   motion abnormalities. Doppler parameters are consistent with   abnormal left ventricular relaxation (grade 1 diastolic   dysfunction). Doppler parameters are consistent with elevated   ventricular end-diastolic filling pressure. - Aortic valve: Trileaflet; normal thickness leaflets.   Transvalvular velocity was within the normal range. There was no   stenosis. There was no regurgitation. - Aortic root: The aortic root was normal in size. - Mitral valve: Severe mitral annular calcifications, predominantly   posterior. Calcified annulus. Mildly thickened leaflets . There   was no regurgitation. - Right ventricle: The cavity  size was normal. Wall thickness was   normal. Systolic function was normal. - Tricuspid valve: There was no regurgitation. - Pulmonary arteries: Systolic pressure was within the normal   range.   LHC 2/15 ([place], [place]) EF 65-70% LCx minimal mid disease LAD minimal disease RCA minimal disease PAP 50/16 mmHg, mean PAP 26 mmHg       ASSESSMENT:    1. Encounter for long-term (current) use of high-risk medication   2. Acute on chronic diastolic heart failure ([place])   3. Essential hypertension   4. Sleep apnea, unspecified type   5. CKD (chronic kidney disease), stage III   6. Acute idiopathic gout, unspecified site      PLAN:  In order of problems listed above:  Acute on chronic diastolic heart failure LVEF 65-70%. Weight is slowly increasing. Reiterated 2 g sodium diet. Increase torsemide to 20 mg 3 tablets daily for 4 days then decrease to 2 tablets daily. Increase K door to 2 tablets daily for 4 days then 1 tablet daily. Check renal function today. Follow-up with Dr. Harrington Melton in 6 weeks.  Hypertension controlled  Sleep apnea patient refuses CPAP. Was supposed to have oxygen at discharge but complication with obtaining oxygen. Does not want home health to reassess.  CK D stage III will check renal function today  Gout continues to have problems. Recommend she see her primary care for this.NSAID's and colchicine not given in the hospital because of renal insufficiency.    Medication Adjustments/Labs and Tests Ordered: Current medicines are reviewed at length with the patient today.  Concerns regarding medicines are outlined above.  Medication changes, Labs and Tests ordered today are listed in the Patient Instructions below. Patient Instructions  Your physician has recommended you make the following change in your medication:  INCREASE  DEMADEX 20 MG   3 TABS  DAILY  FOR  4  DAYS  THEN  RESUME  TO  2  TABS DAILY INCREASE  KCL 20 MEQ  2 TABS  DAILY  FOR  4  DAYS  THEN  RESUME TO  1 TAB  DAILY  Your physician recommends that you  return for lab work in: [place] physician recommends that you schedule a follow-up appointment in:     [place], Ermalinda Barrios, PA-C  03/03/2016 9:27 AM    [address], [place], [place]  [address] Phone: [phone]; Fax: [phone]

## 2016-03-06 ENCOUNTER — Other Ambulatory Visit: Payer: Self-pay | Admitting: Internal Medicine

## 2016-03-06 DIAGNOSIS — I10 Essential (primary) hypertension: Secondary | ICD-10-CM

## 2016-03-06 DIAGNOSIS — I5032 Chronic diastolic (congestive) heart failure: Secondary | ICD-10-CM

## 2016-03-12 DIAGNOSIS — E1151 Type 2 diabetes mellitus with diabetic peripheral angiopathy without gangrene: Secondary | ICD-10-CM | POA: Diagnosis not present

## 2016-03-12 DIAGNOSIS — M109 Gout, unspecified: Secondary | ICD-10-CM | POA: Diagnosis not present

## 2016-03-12 DIAGNOSIS — E7800 Pure hypercholesterolemia, unspecified: Secondary | ICD-10-CM | POA: Diagnosis not present

## 2016-03-12 DIAGNOSIS — D631 Anemia in chronic kidney disease: Secondary | ICD-10-CM | POA: Diagnosis not present

## 2016-03-12 DIAGNOSIS — N189 Chronic kidney disease, unspecified: Secondary | ICD-10-CM | POA: Diagnosis not present

## 2016-03-17 DIAGNOSIS — I130 Hypertensive heart and chronic kidney disease with heart failure and stage 1 through stage 4 chronic kidney disease, or unspecified chronic kidney disease: Secondary | ICD-10-CM | POA: Diagnosis not present

## 2016-03-17 DIAGNOSIS — E1129 Type 2 diabetes mellitus with other diabetic kidney complication: Secondary | ICD-10-CM | POA: Diagnosis not present

## 2016-03-17 DIAGNOSIS — Z0000 Encounter for general adult medical examination without abnormal findings: Secondary | ICD-10-CM | POA: Diagnosis not present

## 2016-03-17 DIAGNOSIS — E1151 Type 2 diabetes mellitus with diabetic peripheral angiopathy without gangrene: Secondary | ICD-10-CM | POA: Diagnosis not present

## 2016-03-17 DIAGNOSIS — Z794 Long term (current) use of insulin: Secondary | ICD-10-CM | POA: Diagnosis not present

## 2016-03-17 DIAGNOSIS — R3129 Other microscopic hematuria: Secondary | ICD-10-CM | POA: Diagnosis not present

## 2016-03-17 DIAGNOSIS — N390 Urinary tract infection, site not specified: Secondary | ICD-10-CM | POA: Diagnosis not present

## 2016-03-17 DIAGNOSIS — D631 Anemia in chronic kidney disease: Secondary | ICD-10-CM | POA: Diagnosis not present

## 2016-03-17 DIAGNOSIS — N184 Chronic kidney disease, stage 4 (severe): Secondary | ICD-10-CM | POA: Diagnosis not present

## 2016-03-17 DIAGNOSIS — Z1389 Encounter for screening for other disorder: Secondary | ICD-10-CM | POA: Diagnosis not present

## 2016-03-17 DIAGNOSIS — M109 Gout, unspecified: Secondary | ICD-10-CM | POA: Diagnosis not present

## 2016-03-17 DIAGNOSIS — D692 Other nonthrombocytopenic purpura: Secondary | ICD-10-CM | POA: Diagnosis not present

## 2016-03-31 ENCOUNTER — Encounter: Payer: Self-pay | Admitting: Physician Assistant

## 2016-03-31 ENCOUNTER — Ambulatory Visit (INDEPENDENT_AMBULATORY_CARE_PROVIDER_SITE_OTHER): Payer: Medicare Other | Admitting: Physician Assistant

## 2016-03-31 VITALS — BP 120/62 | HR 76 | Ht 63.0 in | Wt 253.8 lb

## 2016-03-31 DIAGNOSIS — I5032 Chronic diastolic (congestive) heart failure: Secondary | ICD-10-CM | POA: Diagnosis not present

## 2016-03-31 DIAGNOSIS — G4730 Sleep apnea, unspecified: Secondary | ICD-10-CM | POA: Diagnosis not present

## 2016-03-31 DIAGNOSIS — I10 Essential (primary) hypertension: Secondary | ICD-10-CM | POA: Diagnosis not present

## 2016-03-31 DIAGNOSIS — N183 Chronic kidney disease, stage 3 unspecified: Secondary | ICD-10-CM

## 2016-03-31 DIAGNOSIS — I2510 Atherosclerotic heart disease of native coronary artery without angina pectoris: Secondary | ICD-10-CM

## 2016-03-31 MED ORDER — TORSEMIDE 20 MG PO TABS: 40.0000 mg | ORAL_TABLET | Freq: Every day | ORAL | 3 refills | Status: DC

## 2016-03-31 MED ORDER — POTASSIUM CHLORIDE CRYS ER 20 MEQ PO TBCR: 20.0000 meq | EXTENDED_RELEASE_TABLET | Freq: Every day | ORAL | 3 refills | Status: DC

## 2016-03-31 NOTE — Progress Notes (Signed)
Cardiology Office Note:    Date:  03/31/2016   ID:  Brandi Melton, DOB 29-Sep-1930, MRN [id number]  PCP:  Haywood Pao, MD  Cardiologist:  Dr. Dorris Carnes   Electrophysiologist:  n/a  Referring MD: Haywood Pao, MD   Chief Complaint  Patient presents with  . Follow-up    CHF    History of Present Illness:    Brandi Melton is a 82 y.o. female with a hx of chronic diastolic CHF, HTN, HL, sleep apnea, CKD, rheumatoid arthritis. She lives at [facility] in the independent leaving facility. Diuresis has been augmented with Metolazone in the past. She has also eaten a high salt diet in the past.  She has been admitted several times this year with a/c diastolic CHF.  Last admission was in 11/17 and she was followed by the CHF Team during that admission.  OP Pulmonology evaluation was recommended for possible OSA.  Her DC weight was 245 lbs.  She was last seen in clinic by Ermalinda Barrios, PA-C for post hospitalization follow up.  Her Torsemide was increased for a few days b/c of worsening volume.  She refused CPAP.    She returns for Cardiology follow up.  She is here with her daughter.  She feels like she is stable.  She notes dyspnea on exertion when she walks from one place to the next at her living facility.  She does not like to go walking for exercise.  She denies using too much salt.  She denies chest pain, PND.  LE edema is stable.  She sleeps in a recliner chronically.  She denies syncope. She denies bleeding issues.    Prior CV studies that were reviewed today include:    Echo 3/17 Vigorous LVF, EF 65-70%, normal wall motion, grade 1 diastolic dysfunction, severe MAC, no regurgitation, normal RVSF  LHC 2/15 ([place], [place]) EF 65-70% LCx minimal mid disease LAD minimal disease RCA minimal disease PAP 50/16 mmHg, mean PAP 26 mmHg  Past Medical History:  Diagnosis Date  . Arthritis    "hips before they were replaced, right knee now" (11/09/2015)  .  Chronic diastolic (congestive) heart failure    a. echo 06/2015: EF 65-70%, Grade 1 DD, mitral valve calcification.  . CKD (chronic kidney disease), stage III   . Depression   . H/O cardiac catheterization    a. h/o cardiac catheterization 05/2013 in [place] showing only minimal CAD with increased filling pressure.  . Hyperlipemia   . Hypertensive heart disease   . Morbid obesity ([place])   . Palsy, Bell's   . RA (rheumatoid arthritis) ([place])   . Sleep apnea    "suppose to wear mask; couldn't" (11/09/2015)  . Type II diabetes mellitus ([place])     Past Surgical History:  Procedure Laterality Date  . APPENDECTOMY  1966  . CARDIAC CATHETERIZATION  05/2013  . CARPAL TUNNEL RELEASE Right 08/08/2014   Procedure: OPEN CARPAL TUNNEL RELEASE;  Surgeon: Roseanne Kaufman, MD;  Location: [facility];  Service: Orthopedics;  Laterality: Right;  . CATARACT EXTRACTION W/ INTRAOCULAR LENS  IMPLANT, BILATERAL Bilateral   . CHOLECYSTECTOMY OPEN  1966  . COLONOSCOPY    . FRACTURE SURGERY    . JOINT REPLACEMENT    . KNEE ARTHROPLASTY Left   . ORIF WRIST FRACTURE Right 08/08/2014   Procedure: Right OPEN REDUCTION INTERNAL FIXATION (ORIF) WRIST FRACTURE WITH REPAIR AND RECONST/ALLOGRAFT AND BONE GRAFT;  Surgeon: Roseanne Kaufman, MD;  Location: [facility];  Service: Orthopedics;  Laterality: Right;  . TOTAL HIP ARTHROPLASTY Bilateral     Current Medications: Current Meds  Medication Sig  . allopurinol (ZYLOPRIM) 300 MG tablet Take 300 mg by mouth at bedtime.   Marland Kitchen aspirin EC 81 MG tablet Take 81 mg by mouth daily.  . calcium carbonate (TUMS EX) 750 MG chewable tablet Chew 1 tablet by mouth daily.  . carvedilol (COREG CR) 80 MG 24 hr capsule Take 1 capsule (80 mg total) by mouth daily.  . chlorhexidine (PERIDEX) 0.12 % solution Use as directed 5 mLs in the mouth or throat 2 (two) times daily.   . cholecalciferol (VITAMIN D) 1000 units tablet Take 3,000 Units by mouth daily.  Marland Kitchen ezetimibe-simvastatin (VYTORIN) 10-40 MG per  tablet Take 1 tablet by mouth daily.   . folic acid (FOLVITE) 818 MCG tablet Take 400 mcg by mouth daily.  . insulin aspart (NOVOLOG) 100 UNIT/ML injection Inject 35 Units into the skin 4 (four) times daily.   . Insulin Detemir (LEVEMIR) 100 UNIT/ML Pen Inject 80 Units into the skin 2 (two) times daily.   . magnesium oxide (MAG-OX) 400 MG tablet Take 400 mg by mouth daily.  . potassium chloride SA (K-DUR,KLOR-CON) 20 MEQ tablet Take 1 tablet (20 mEq total) by mouth daily.  Marland Kitchen telmisartan (MICARDIS) 20 MG tablet Take 20 mg by mouth daily.  Marland Kitchen torsemide (DEMADEX) 20 MG tablet Take 2 tablets (40 mg total) by mouth daily.  . Vitamin D, Ergocalciferol, (DRISDOL) 50000 UNITS CAPS capsule Take 50,000 Units by mouth every Wednesday.   . [DISCONTINUED] potassium chloride SA (K-DUR,KLOR-CON) 20 MEQ tablet Take 40 mEq by mouth daily. FOR  4 DAYS  THEN  BACK TO 20 MEQ  . [DISCONTINUED] torsemide (DEMADEX) 20 MG tablet TAKE 1 TABLET(20 MG) BY MOUTH TWICE DAILY     Allergies:   Patient has no known allergies.   Social History   Social History  . Marital status: Widowed    Spouse name: N/A  . Number of children: N/A  . Years of education: N/A   Social History Main Topics  . Smoking status: Never Smoker  . Smokeless tobacco: Never Used  . Alcohol use 0.0 oz/week     Comment: 11/09/2015 "might have glass of champaign on special occasion"  . Drug use: No  . Sexual activity: No   Other Topics Concern  . None   Social History Narrative  . None     Family History:  The patient's family history includes Stroke in her mother; Sudden death in her mother.   ROS:   Please see the history of present illness.    Review of Systems  Constitution: Positive for chills.  Cardiovascular: Positive for dyspnea on exertion.  Respiratory: Positive for shortness of breath.    All other systems reviewed and are negative.   EKGs/Labs/Other Test Reviewed:    EKG:  EKG is  ordered today.  The ekg ordered today  demonstrates NSR, HR 76, LAD, QTc 465 ms, no change from prior tracing.   Recent Labs: 11/07/2015: TSH 1.824 02/04/2016: Magnesium 2.4 02/18/2016: ALT 18; B Natriuretic Peptide 99.9; Hemoglobin 11.3; Platelets 225 03/03/2016: BUN 32; Creat 1.30; Potassium 4.2; Sodium 145   Recent Lipid Panel No results found for: CHOL, TRIG, HDL, CHOLHDL, VLDL, LDLCALC, LDLDIRECT   Physical Exam:    VS:  BP 120/62   Pulse 76   Ht 5\' 3"  (1.6 m)   Wt 253 lb 12.8 oz (115.1 kg)   BMI  44.96 kg/m     Wt Readings from Last 3 Encounters:  03/31/16 253 lb 12.8 oz (115.1 kg)  03/03/16 250 lb 4 oz (113.5 kg)  02/22/16 254 lb 14.4 oz (115.6 kg)     Physical Exam  Constitutional: She is oriented to person, place, and time. She appears well-developed and well-nourished. No distress.  HENT:  Head: Normocephalic and atraumatic.  Eyes: No scleral icterus.  Neck:  I cannot appreciate JVD  Cardiovascular: Normal rate and regular rhythm.   No murmur heard. Pulmonary/Chest: Effort normal. She has no wheezes. She has no rales.  Abdominal: Soft. There is no tenderness.  Musculoskeletal: She exhibits edema.  Trace bilateral LE edema - stockings in place  Neurological: She is alert and oriented to person, place, and time.  Skin: Skin is warm and dry.  Psychiatric: She has a normal mood and affect.    ASSESSMENT:    1. Chronic diastolic CHF (congestive heart failure) ([place])   2. Essential hypertension   3. CKD (chronic kidney disease), stage III   4. Sleep apnea, unspecified type    PLAN:    In order of problems listed above:  1. Chronic Diastolic CHF -  NYHA 2b-3.  Volume is fairly stable.  Her weight is up some since last seen and she notes an increase in dyspnea on exertion.  She is fairly sedentary which contributes to her issues with dyspnea.  I have encouraged her to be more active.  There are some exercise classes she can take at her living facility. She continues to limit her sodium intake.    -   Increase Torsemide to 40 mg in A and 20 mg in P x 2 days, then resume 40 mg QD.  -  Take extra Torsemide if weight > 250; call if weight remains > 250.  2. HTN Heart Disease with CHF - BP is controlled.  3. CKD -  Recent Creatinine stable at 1.30.  4. OSA - She has refused CPAP.   Medication Adjustments/Labs and Tests Ordered: Current medicines are reviewed at length with the patient today.  Concerns regarding medicines are outlined above.  Medication changes, Labs and Tests ordered today are outlined in the Patient Instructions noted below. Patient Instructions  Medication Instructions:  1. A REFILL FOR TORSEMIDE AND POTASSIUM HAVE BEEN SENT TO [facility] 2. TAKE TORSEMIDE 40 MG IN THE MORNING AND 20 MG IN THE PM FOR 2 DAYS THEN GO TO TORSEMIDE 40 MG DAILY  Labwork: NONE  Testing/Procedures: NONE  Follow-Up: 07/07/16 @ 9:15 WITH DR. ROSS  Any Other Special Instructions Will Be Listed Below (If Applicable). MONITOR WEIGHT DAILY AND IF WEIGHT IS AOVE 25O LB'S PER Wessie Shanks, PAC TAKE EXTRA TORSEMIDE 20 MG THAT DAY; IF YOUR WEIGHT IS CONSISTENTLY STAYING ABOVE 250 LB'S PLEASE CALL THE OFFICE [phone] TO LET [facility], PAC  If you need a refill on your cardiac medications before your next appointment, please call your pharmacy.  Signed, Richardson Dopp, PA-C  03/31/2016 5:31 PM    [facility] [place], [place],   [address] Phone: [phone]; Fax: [phone]

## 2016-03-31 NOTE — Patient Instructions (Addendum)
Medication Instructions:  1. A REFILL FOR TORSEMIDE AND POTASSIUM HAVE BEEN SENT TO [facility] 2. TAKE TORSEMIDE 40 MG IN THE MORNING AND 20 MG IN THE PM FOR 2 DAYS THEN GO TO TORSEMIDE 40 MG DAILY  Labwork: NONE  Testing/Procedures: NONE  Follow-Up: 07/07/16 @ 9:15 WITH DR. ROSS  Any Other Special Instructions Will Be Listed Below (If Applicable). MONITOR WEIGHT DAILY AND IF WEIGHT IS AOVE 25O LB'S PER SCOTT WEAVER, PAC TAKE EXTRA TORSEMIDE 20 MG THAT DAY; IF YOUR WEIGHT IS CONSISTENTLY STAYING ABOVE 250 LB'S PLEASE CALL THE OFFICE [phone] TO LET [facility], PAC  If you need a refill on your cardiac medications before your next appointment, please call your pharmacy.

## 2016-04-24 DIAGNOSIS — D539 Nutritional anemia, unspecified: Secondary | ICD-10-CM | POA: Diagnosis not present

## 2016-04-24 DIAGNOSIS — I130 Hypertensive heart and chronic kidney disease with heart failure and stage 1 through stage 4 chronic kidney disease, or unspecified chronic kidney disease: Secondary | ICD-10-CM | POA: Diagnosis not present

## 2016-04-24 DIAGNOSIS — J209 Acute bronchitis, unspecified: Secondary | ICD-10-CM | POA: Diagnosis not present

## 2016-04-24 DIAGNOSIS — B961 Klebsiella pneumoniae [K. pneumoniae] as the cause of diseases classified elsewhere: Secondary | ICD-10-CM | POA: Diagnosis not present

## 2016-04-24 DIAGNOSIS — E1122 Type 2 diabetes mellitus with diabetic chronic kidney disease: Secondary | ICD-10-CM | POA: Diagnosis not present

## 2016-04-24 DIAGNOSIS — N183 Chronic kidney disease, stage 3 (moderate): Secondary | ICD-10-CM | POA: Diagnosis not present

## 2016-04-24 DIAGNOSIS — L89322 Pressure ulcer of left buttock, stage 2: Secondary | ICD-10-CM | POA: Diagnosis not present

## 2016-04-24 DIAGNOSIS — N3000 Acute cystitis without hematuria: Secondary | ICD-10-CM | POA: Diagnosis not present

## 2016-04-24 DIAGNOSIS — M1990 Unspecified osteoarthritis, unspecified site: Secondary | ICD-10-CM | POA: Diagnosis not present

## 2016-04-24 DIAGNOSIS — I5032 Chronic diastolic (congestive) heart failure: Secondary | ICD-10-CM | POA: Diagnosis not present

## 2016-05-22 ENCOUNTER — Other Ambulatory Visit: Payer: Self-pay | Admitting: *Deleted

## 2016-05-22 MED ORDER — CARVEDILOL PHOSPHATE ER 80 MG PO CP24: 80.0000 mg | ORAL_CAPSULE | Freq: Every day | ORAL | 3 refills | Status: DC

## 2016-05-26 ENCOUNTER — Other Ambulatory Visit: Payer: Self-pay | Admitting: *Deleted

## 2016-05-26 DIAGNOSIS — I5032 Chronic diastolic (congestive) heart failure: Secondary | ICD-10-CM

## 2016-05-26 DIAGNOSIS — I10 Essential (primary) hypertension: Secondary | ICD-10-CM

## 2016-05-26 MED ORDER — TORSEMIDE 20 MG PO TABS: 40.0000 mg | ORAL_TABLET | Freq: Every day | ORAL | 0 refills | Status: DC

## 2016-06-10 ENCOUNTER — Other Ambulatory Visit: Payer: Self-pay | Admitting: Internal Medicine

## 2016-06-10 NOTE — Telephone Encounter (Signed)
Approved    Disp Refills Start End  carvedilol (COREG CR) 80 MG 24 hr capsule 90 capsule 3 05/22/2016   Sig - Route:  Take 1 capsule (80 mg total) by mouth daily. - Oral  Class:  Normal  DAW:  No  Authorizing Provider:  Fay Records, MD  Ordering User:  Juventino Slovak, [place], [place]

## 2016-06-23 DIAGNOSIS — E1129 Type 2 diabetes mellitus with other diabetic kidney complication: Secondary | ICD-10-CM | POA: Diagnosis not present

## 2016-06-23 DIAGNOSIS — D692 Other nonthrombocytopenic purpura: Secondary | ICD-10-CM | POA: Diagnosis not present

## 2016-06-23 DIAGNOSIS — I5032 Chronic diastolic (congestive) heart failure: Secondary | ICD-10-CM | POA: Diagnosis not present

## 2016-06-23 DIAGNOSIS — G4733 Obstructive sleep apnea (adult) (pediatric): Secondary | ICD-10-CM | POA: Diagnosis not present

## 2016-06-23 DIAGNOSIS — E1151 Type 2 diabetes mellitus with diabetic peripheral angiopathy without gangrene: Secondary | ICD-10-CM | POA: Diagnosis not present

## 2016-06-23 DIAGNOSIS — D631 Anemia in chronic kidney disease: Secondary | ICD-10-CM | POA: Diagnosis not present

## 2016-06-23 DIAGNOSIS — Z6841 Body Mass Index (BMI) 40.0 and over, adult: Secondary | ICD-10-CM | POA: Diagnosis not present

## 2016-06-23 DIAGNOSIS — R600 Localized edema: Secondary | ICD-10-CM | POA: Diagnosis not present

## 2016-06-23 DIAGNOSIS — M109 Gout, unspecified: Secondary | ICD-10-CM | POA: Diagnosis not present

## 2016-06-23 DIAGNOSIS — I130 Hypertensive heart and chronic kidney disease with heart failure and stage 1 through stage 4 chronic kidney disease, or unspecified chronic kidney disease: Secondary | ICD-10-CM | POA: Diagnosis not present

## 2016-06-23 DIAGNOSIS — N184 Chronic kidney disease, stage 4 (severe): Secondary | ICD-10-CM | POA: Diagnosis not present

## 2016-07-05 NOTE — Progress Notes (Signed)
Cardiology Office Note   Date:  07/07/2016   ID:  Elaine, [place] May 06, 1930, MRN [id number]  PCP:  Haywood Pao, MD  Cardiologist:   Dorris Carnes, MD    F/U of diastolic CHF     History of Present Illness: Brandi Melton is a 83 y.o. female with a history of chronic diastolic CHF, HTN, HL, OSA, CKD , RA.    I saw her in October   Since then she has been seen by Gerrianne Scale and S Weaver(Dec 2017) Since seen by Nicki Reaper she remains fatigued  Tired all the time  Not weighing self  Feels swollen  More SOB   Claims she is eatting lower salt soups      Current Meds  Medication Sig  . allopurinol (ZYLOPRIM) 300 MG tablet Take 300 mg by mouth at bedtime.   Marland Kitchen aspirin EC 81 MG tablet Take 81 mg by mouth daily.  . calcium carbonate (TUMS EX) 750 MG chewable tablet Chew 1 tablet by mouth daily.  . carvedilol (COREG CR) 80 MG 24 hr capsule Take 1 capsule (80 mg total) by mouth daily.  . chlorhexidine (PERIDEX) 0.12 % solution Use as directed 5 mLs in the mouth or throat 2 (two) times daily.   . cholecalciferol (VITAMIN D) 1000 units tablet Take 3,000 Units by mouth daily.  Marland Kitchen ezetimibe-simvastatin (VYTORIN) 10-40 MG per tablet Take 1 tablet by mouth daily.   . folic acid (FOLVITE) 644 MCG tablet Take 400 mcg by mouth daily.  . insulin aspart (NOVOLOG) 100 UNIT/ML injection Inject 35 Units into the skin 4 (four) times daily.   . Insulin Detemir (LEVEMIR) 100 UNIT/ML Pen Inject 80 Units into the skin 2 (two) times daily.   . magnesium oxide (MAG-OX) 400 MG tablet Take 400 mg by mouth daily.  . potassium chloride SA (K-DUR,KLOR-CON) 20 MEQ tablet Take 1 tablet (20 mEq total) by mouth daily.  Marland Kitchen telmisartan (MICARDIS) 20 MG tablet Take 20 mg by mouth daily.  Marland Kitchen torsemide (DEMADEX) 20 MG tablet Take 2 tablets (40 mg total) by mouth daily.  . Vitamin D, Ergocalciferol, (DRISDOL) 50000 UNITS CAPS capsule Take 50,000 Units by mouth every Wednesday.      Allergies:   Patient has no known allergies.     Past Medical History:  Diagnosis Date  . Arthritis    "hips before they were replaced, right knee now" (11/09/2015)  . Chronic diastolic (congestive) heart failure    a. echo 06/2015: EF 65-70%, Grade 1 DD, mitral valve calcification.  . CKD (chronic kidney disease), stage III   . Depression   . H/O cardiac catheterization    a. h/o cardiac catheterization 05/2013 in [place] showing only minimal CAD with increased filling pressure.  . Hyperlipemia   . Hypertensive heart disease   . Morbid obesity ([place])   . Palsy, Bell's   . RA (rheumatoid arthritis) ([place])   . Sleep apnea    "suppose to wear mask; couldn't" (11/09/2015)  . Type II diabetes mellitus ([place])     Past Surgical History:  Procedure Laterality Date  . APPENDECTOMY  1966  . CARDIAC CATHETERIZATION  05/2013  . CARPAL TUNNEL RELEASE Right 08/08/2014   Procedure: OPEN CARPAL TUNNEL RELEASE;  Surgeon: Roseanne Kaufman, MD;  Location: [facility];  Service: Orthopedics;  Laterality: Right;  . CATARACT EXTRACTION W/ INTRAOCULAR LENS  IMPLANT, BILATERAL Bilateral   . CHOLECYSTECTOMY OPEN  1966  . COLONOSCOPY    . FRACTURE SURGERY    .  JOINT REPLACEMENT    . KNEE ARTHROPLASTY Left   . ORIF WRIST FRACTURE Right 08/08/2014   Procedure: Right OPEN REDUCTION INTERNAL FIXATION (ORIF) WRIST FRACTURE WITH REPAIR AND RECONST/ALLOGRAFT AND BONE GRAFT;  Surgeon: Roseanne Kaufman, MD;  Location: [facility];  Service: Orthopedics;  Laterality: Right;  . TOTAL HIP ARTHROPLASTY Bilateral      Social History:  The patient  reports that she has never smoked. She has never used smokeless tobacco. She reports that she drinks alcohol. She reports that she does not use drugs.   Family History:  The patient's family history includes Stroke in her mother; Sudden death in her mother.    ROS:  Please see the history of present illness. All other systems are reviewed and  Negative to the above problem except as noted.    PHYSICAL EXAM: VS:  BP 134/62   Pulse  77   Ht 5\' 3"  (1.6 m)   Wt 253 lb 12.8 oz (115.1 kg)   SpO2 97%   BMI 44.96 kg/m   GEN: Well nourished, well developed, in no acute distress   Tearful on exam   HEENT: normal  Neck  Full   Cardiac: RRR; no murmurs, rubs, or gallops,2+ edema  Respiratory:  clear to auscultation bilaterally, normal work of breathing  Crackles faint at L base  GI: soft, nontender, nondistended, + BS  No hepatomegaly  MS: no deformity Moving all extremities   Skin: warm and dry, no rash Neuro:  Strength and sensation are intact Psych: euthymic mood, full affect   EKG:  EKG is not ordered today.   Lipid Panel No results found for: CHOL, TRIG, HDL, CHOLHDL, VLDL, LDLCALC, LDLDIRECT    Wt Readings from Last 3 Encounters:  07/07/16 253 lb 12.8 oz (115.1 kg)  03/31/16 253 lb 12.8 oz (115.1 kg)  03/03/16 250 lb 4 oz (113.5 kg)      ASSESSMENT AND PLAN:  1  Acute on chronic diastolic CHF  Volume is up  Her dry wt is 245  Now 253  Encouraged her to weigh daily Take 20 torsemide tonight then 40 q AM  Will call with test reusts    Wach salt   2  HTN  BP is OK    3  CKD  Labs today    4  OSA  Has refused CPAP  No change  Continue to refuse   5  DM  Meds adjusted by Dr Osborne Casco  Pt had to pull back a little because glu was too low at times   F/U in about 4 to 6 wks     Current medicines are reviewed at length with the patient today.  The patient does not have concerns regarding medicines.  Signed, Dorris Carnes, MD  07/07/2016 9:32 AM    [facility] [address], [place], [place]  [address] Phone: [phone]; Fax: [phone]

## 2016-07-07 ENCOUNTER — Ambulatory Visit (INDEPENDENT_AMBULATORY_CARE_PROVIDER_SITE_OTHER): Payer: Medicare Other | Admitting: Internal Medicine

## 2016-07-07 ENCOUNTER — Encounter (INDEPENDENT_AMBULATORY_CARE_PROVIDER_SITE_OTHER): Payer: Self-pay

## 2016-07-07 ENCOUNTER — Encounter: Payer: Self-pay | Admitting: Internal Medicine

## 2016-07-07 VITALS — BP 134/62 | HR 77 | Ht 63.0 in | Wt 253.8 lb

## 2016-07-07 DIAGNOSIS — I5043 Acute on chronic combined systolic (congestive) and diastolic (congestive) heart failure: Secondary | ICD-10-CM

## 2016-07-07 DIAGNOSIS — I5032 Chronic diastolic (congestive) heart failure: Secondary | ICD-10-CM

## 2016-07-07 DIAGNOSIS — I10 Essential (primary) hypertension: Secondary | ICD-10-CM | POA: Diagnosis not present

## 2016-07-07 DIAGNOSIS — N184 Chronic kidney disease, stage 4 (severe): Secondary | ICD-10-CM

## 2016-07-07 MED ORDER — CARVEDILOL PHOSPHATE ER 80 MG PO CP24: 80.0000 mg | ORAL_CAPSULE | Freq: Every day | ORAL | 3 refills | Status: DC

## 2016-07-07 MED ORDER — EZETIMIBE-SIMVASTATIN 10-40 MG PO TABS: 1.0000 | ORAL_TABLET | Freq: Every day | ORAL | 3 refills | Status: DC

## 2016-07-07 MED ORDER — MAGNESIUM OXIDE 400 MG PO TABS: 400.0000 mg | ORAL_TABLET | Freq: Every day | ORAL | 3 refills | Status: DC

## 2016-07-07 MED ORDER — TELMISARTAN 20 MG PO TABS: 20.0000 mg | ORAL_TABLET | Freq: Every day | ORAL | 3 refills | Status: DC

## 2016-07-07 MED ORDER — TORSEMIDE 20 MG PO TABS: 40.0000 mg | ORAL_TABLET | Freq: Every day | ORAL | 3 refills | Status: DC

## 2016-07-07 NOTE — Patient Instructions (Signed)
Your physician recommends that you continue on your current medications as directed. Please refer to the Current Medication list given to you today. TAKE TORSEMIDE 40 MG TABLETS TOGETHER IN THE MORNING  Your physician recommends that you return for lab work today (BMET, BNP, TSH)  Your physician recommends that you schedule a follow-up appointment in: [place] DR. Harrington Challenger.  WEIGH DAILY AND RECORD. CALL DR. ROSS IF WEIGHT INCREASE BY 3 OR MORE POUNDS OVERNIGHT OR 5 POUNDS IN A WEEK.

## 2016-07-08 ENCOUNTER — Telehealth: Payer: Self-pay | Admitting: Internal Medicine

## 2016-07-08 DIAGNOSIS — I5033 Acute on chronic diastolic (congestive) heart failure: Secondary | ICD-10-CM

## 2016-07-08 LAB — BASIC METABOLIC PANEL
BUN / CREAT RATIO: 19 (ref 12–28)
BUN: 23 mg/dL (ref 8–27)
CALCIUM: 9.4 mg/dL (ref 8.7–10.3)
CO2: 26 mmol/L (ref 18–29)
CREATININE: 1.18 mg/dL — AB (ref 0.57–1.00)
Chloride: 105 mmol/L (ref 96–106)
GFR calc Af Amer: 49 mL/min/{1.73_m2} — ABNORMAL LOW (ref >59)
GFR, EST NON AFRICAN AMERICAN: 43 mL/min/{1.73_m2} — AB (ref >59)
GLUCOSE: 114 mg/dL — AB (ref 65–99)
Potassium: 5 mmol/L (ref 3.5–5.2)
SODIUM: 145 mmol/L — AB (ref 134–144)

## 2016-07-08 LAB — TSH: TSH: 2.94 u[IU]/mL (ref 0.450–4.500)

## 2016-07-08 LAB — PRO B NATRIURETIC PEPTIDE: NT-Pro BNP: 146 pg/mL (ref 0–738)

## 2016-07-08 NOTE — Telephone Encounter (Signed)
Spoke with [employer] order to verify pt's direction of Torsemide medication. Medication was verify for pt to take 40 mg of torsemide every morning and 20 mg every third day. pharmacist states the 40 mg prescription every morning is being process, and will be mailed to pt . So pt's prescription for Torsemide 40 mg every am and 20 mg every third day will be there as a future order if needed. Pt is aware of lab work and Dr. Harrington Challenger'  recommendation. Pt is aware to come for repeat BMET in 14 days. Pt has an appointment for labs on Wednesday April 11 th for labs work.

## 2016-07-08 NOTE — Telephone Encounter (Signed)
Follow Up:   Needs to verify the directions of pt's Torsemide please.

## 2016-07-23 ENCOUNTER — Other Ambulatory Visit: Payer: Medicare Other | Admitting: *Deleted

## 2016-07-23 DIAGNOSIS — I5033 Acute on chronic diastolic (congestive) heart failure: Secondary | ICD-10-CM | POA: Diagnosis not present

## 2016-07-24 LAB — BASIC METABOLIC PANEL
BUN/Creatinine Ratio: 27 (ref 12–28)
BUN: 36 mg/dL — AB (ref 8–27)
CALCIUM: 9 mg/dL (ref 8.7–10.3)
CO2: 24 mmol/L (ref 18–29)
CREATININE: 1.31 mg/dL — AB (ref 0.57–1.00)
Chloride: 101 mmol/L (ref 96–106)
GFR calc Af Amer: 43 mL/min/{1.73_m2} — ABNORMAL LOW (ref >59)
GFR, EST NON AFRICAN AMERICAN: 38 mL/min/{1.73_m2} — AB (ref >59)
Glucose: 238 mg/dL — ABNORMAL HIGH (ref 65–99)
POTASSIUM: 4.4 mmol/L (ref 3.5–5.2)
Sodium: 144 mmol/L (ref 134–144)

## 2016-08-11 ENCOUNTER — Ambulatory Visit (INDEPENDENT_AMBULATORY_CARE_PROVIDER_SITE_OTHER): Payer: Medicare Other | Admitting: Internal Medicine

## 2016-08-11 ENCOUNTER — Encounter: Payer: Self-pay | Admitting: Internal Medicine

## 2016-08-11 VITALS — BP 114/58 | HR 80 | Ht 63.0 in | Wt 252.8 lb

## 2016-08-11 DIAGNOSIS — I5033 Acute on chronic diastolic (congestive) heart failure: Secondary | ICD-10-CM | POA: Diagnosis not present

## 2016-08-11 DIAGNOSIS — R300 Dysuria: Secondary | ICD-10-CM

## 2016-08-11 NOTE — Progress Notes (Signed)
Cardiology Office Note   Date:  08/13/2016   ID:  Jeidi, Gilles 30-Oct-1930, MRN [id number]  PCP:  Haywood Pao, MD  Cardiologist:   Dorris Carnes, MD    F/U of diastolic CHF     History of Present Illness: Brandi Melton is a 83 y.o. female with a history of chronic diastolic CHF, HTN, HL, OSA, CKD , RA.    I saw her in clinic in March  Volume was up at that time I I recomm changing torsemide dose Since seen she says breathing is about the same  [place] with LE swelling , esp thigh Daugher is with her today Says pt eats hot dogs, chipse    Current Meds  Medication Sig  . allopurinol (ZYLOPRIM) 300 MG tablet Take 300 mg by mouth at bedtime.   Marland Kitchen aspirin EC 81 MG tablet Take 81 mg by mouth daily.  . calcium carbonate (TUMS EX) 750 MG chewable tablet Chew 1 tablet by mouth daily.  . carvedilol (COREG CR) 80 MG 24 hr capsule Take 1 capsule (80 mg total) by mouth daily.  . chlorhexidine (PERIDEX) 0.12 % solution Use as directed 5 mLs in the mouth or throat 2 (two) times daily.   . cholecalciferol (VITAMIN D) 1000 units tablet Take 3,000 Units by mouth daily.  Marland Kitchen ezetimibe-simvastatin (VYTORIN) 10-40 MG tablet Take 1 tablet by mouth daily.  . folic acid (FOLVITE) 672 MCG tablet Take 400 mcg by mouth daily.  . insulin aspart (NOVOLOG) 100 UNIT/ML injection Inject 35 Units into the skin 4 (four) times daily.   . Insulin Detemir (LEVEMIR) 100 UNIT/ML Pen Inject 80 Units into the skin 2 (two) times daily.   . magnesium oxide (MAG-OX) 400 MG tablet Take 1 tablet (400 mg total) by mouth daily.  . potassium chloride SA (K-DUR,KLOR-CON) 20 MEQ tablet Take 1 tablet (20 mEq total) by mouth daily.  Marland Kitchen telmisartan (MICARDIS) 20 MG tablet Take 1 tablet (20 mg total) by mouth daily.  Marland Kitchen torsemide (DEMADEX) 20 MG tablet Take 2 tablets (40 mg total) by mouth daily.  . Vitamin D, Ergocalciferol, (DRISDOL) 50000 UNITS CAPS capsule Take 50,000 Units by mouth every Wednesday.      Allergies:   Patient  has no known allergies.   Past Medical History:  Diagnosis Date  . Arthritis    "hips before they were replaced, right knee now" (11/09/2015)  . Chronic diastolic (congestive) heart failure (HCC)    a. echo 06/2015: EF 65-70%, Grade 1 DD, mitral valve calcification.  . CKD (chronic kidney disease), stage III   . Depression   . H/O cardiac catheterization    a. h/o cardiac catheterization 05/2013 in [place] showing only minimal CAD with increased filling pressure.  . Hyperlipemia   . Hypertensive heart disease   . Morbid obesity ([place])   . Palsy, Bell's   . RA (rheumatoid arthritis) ([place])   . Sleep apnea    "suppose to wear mask; couldn't" (11/09/2015)  . Type II diabetes mellitus ([place])     Past Surgical History:  Procedure Laterality Date  . APPENDECTOMY  1966  . CARDIAC CATHETERIZATION  05/2013  . CARPAL TUNNEL RELEASE Right 08/08/2014   Procedure: OPEN CARPAL TUNNEL RELEASE;  Surgeon: Roseanne Kaufman, MD;  Location: [facility];  Service: Orthopedics;  Laterality: Right;  . CATARACT EXTRACTION W/ INTRAOCULAR LENS  IMPLANT, BILATERAL Bilateral   . CHOLECYSTECTOMY OPEN  1966  . COLONOSCOPY    . FRACTURE SURGERY    .  JOINT REPLACEMENT    . KNEE ARTHROPLASTY Left   . ORIF WRIST FRACTURE Right 08/08/2014   Procedure: Right OPEN REDUCTION INTERNAL FIXATION (ORIF) WRIST FRACTURE WITH REPAIR AND RECONST/ALLOGRAFT AND BONE GRAFT;  Surgeon: Roseanne Kaufman, MD;  Location: [facility];  Service: Orthopedics;  Laterality: Right;  . TOTAL HIP ARTHROPLASTY Bilateral      Social History:  The patient  reports that she has never smoked. She has never used smokeless tobacco. She reports that she drinks alcohol. She reports that she does not use drugs.   Family History:  The patient's family history includes Stroke in her mother; Sudden death in her mother.    ROS:  Please see the history of present illness. All other systems are reviewed and  Negative to the above problem except as noted.    PHYSICAL  EXAM: VS:  BP (!) 114/58   Pulse 80   Ht 5\' 3"  (1.6 m)   Wt 252 lb 12.8 oz (114.7 kg)   SpO2 95%   BMI 44.78 kg/m   GEN:  Morbidly obese 83 yo  in no acute distress     HEENT: normal  Neck  Full   Cardiac: RRR; no murmurs, rubs, or gallops,1+ edema  Respiratory:  clear to auscultation bilaterally, normal work of breathing  Crackles faint at L base  GI: soft, nontender, nondistended, + BS  No hepatomegaly  MS: no deformity Moving all extremities   Skin: warm and dry, no rash Neuro:  Strength and sensation are intact Psych: euthymic mood, full affect   EKG:  EKG is not ordered today.   Lipid Panel No results found for: CHOL, TRIG, HDL, CHOLHDL, VLDL, LDLCALC, LDLDIRECT    Wt Readings from Last 3 Encounters:  08/11/16 252 lb 12.8 oz (114.7 kg)  07/07/16 253 lb 12.8 oz (115.1 kg)  03/31/16 253 lb 12.8 oz (115.1 kg)      ASSESSMENT AND PLAN:  1  Acute on chronic diastolic CHF Weight is still up  Edema on exam  Daughter reprots pts indescretions.  Reviewed with pt  Would get labs today to determine if any adustments can be made in meds     2  HTN  BP is good    3  CKD  Labs today    4  OSA  Pt refuses CPAP   5  DM  Meds adjusted by Dr Osborne Casco   F/U   Later this summer     Current medicines are reviewed at length with the patient today.  The patient does not have concerns regarding medicines.  Signed, Dorris Carnes, MD  08/13/2016 11:29 PM    [facility] [address], [place], [place]  [address] Phone: [phone]; Fax: [phone]

## 2016-08-11 NOTE — Patient Instructions (Signed)
Your physician recommends that you continue on your current medications as directed. Please refer to the Current Medication list given to you today.     

## 2016-08-12 ENCOUNTER — Other Ambulatory Visit: Payer: Medicare Other

## 2016-08-12 DIAGNOSIS — I5033 Acute on chronic diastolic (congestive) heart failure: Secondary | ICD-10-CM | POA: Diagnosis not present

## 2016-08-12 DIAGNOSIS — R300 Dysuria: Secondary | ICD-10-CM | POA: Diagnosis not present

## 2016-08-12 LAB — BASIC METABOLIC PANEL
BUN/Creatinine Ratio: 26 (ref 12–28)
BUN: 38 mg/dL — AB (ref 8–27)
CALCIUM: 8.9 mg/dL (ref 8.7–10.3)
CHLORIDE: 96 mmol/L (ref 96–106)
CO2: 27 mmol/L (ref 18–29)
CREATININE: 1.47 mg/dL — AB (ref 0.57–1.00)
GFR calc non Af Amer: 33 mL/min/{1.73_m2} — ABNORMAL LOW (ref >59)
GFR, EST AFRICAN AMERICAN: 38 mL/min/{1.73_m2} — AB (ref >59)
Glucose: 358 mg/dL — ABNORMAL HIGH (ref 65–99)
Potassium: 5.1 mmol/L (ref 3.5–5.2)
Sodium: 138 mmol/L (ref 134–144)

## 2016-08-13 ENCOUNTER — Other Ambulatory Visit: Payer: Self-pay | Admitting: *Deleted

## 2016-08-13 DIAGNOSIS — I5033 Acute on chronic diastolic (congestive) heart failure: Secondary | ICD-10-CM

## 2016-08-13 LAB — URINALYSIS
Bilirubin, UA: NEGATIVE
Ketones, UA: NEGATIVE
NITRITE UA: POSITIVE — AB
PH UA: 5.5 (ref 5.0–7.5)
PROTEIN UA: NEGATIVE
RBC, UA: NEGATIVE
Specific Gravity, UA: 1.013 (ref 1.005–1.030)
Urobilinogen, Ur: 0.2 mg/dL (ref 0.2–1.0)

## 2016-08-13 MED ORDER — SULFAMETHOXAZOLE-TRIMETHOPRIM 800-160 MG PO TABS: 1.0000 | ORAL_TABLET | Freq: Two times a day (BID) | ORAL | 0 refills | Status: DC

## 2016-08-25 ENCOUNTER — Telehealth: Payer: Self-pay | Admitting: Internal Medicine

## 2016-08-25 NOTE — Telephone Encounter (Signed)
Called back to patient's daughter who is extremely upset that I am calling her back so late in the day with no information other than contact PCP or go to urgent care to follow up on urinary symptoms.   I have advised her that I received the message as noted earlier today and sent it to Dr. Harrington Challenger and was giving her time to respond with recommendations since she is not in the office today.   The patient finished her antibiotic and is still having symptoms.  They would like to bring another urine specimen or obtain a different antibiotic.  Her daughter is very upset-screaming- that this is the answer at the end of the day.   Patient's daughter is demanding a return call today or tomorrow from Dr. Harrington Challenger to either she or her husband, who she informed me is a doctor.   Daughter Jabier Gauss cell: [phone].  Son in law: [phone].  Will route to Dr. Harrington Challenger

## 2016-08-25 NOTE — Telephone Encounter (Signed)
New message     Pt daughter is now calling , they want a call within the hour about the UTI , pt is not feeling well and the medication she is on is not making things better

## 2016-08-25 NOTE — Telephone Encounter (Signed)
Follow Up:   Pt says she have finished taking her medicine for her infection.She says she thinks she needs something stronger,she still have the symptoms.

## 2016-08-25 NOTE — Telephone Encounter (Signed)
Left message for patient to call back  

## 2016-08-25 NOTE — Telephone Encounter (Signed)
Patient called and spoke with CMA in refills.  Her UTI symptoms are still present and she is requesting a different/stronger antibiotic.  Will route to Dr. Harrington Challenger for review.  Pt may need to follow up with her PCP for this.

## 2016-08-26 DIAGNOSIS — R300 Dysuria: Secondary | ICD-10-CM | POA: Diagnosis not present

## 2016-08-26 NOTE — Telephone Encounter (Signed)
Spoke to Brandi Melton  She was still feeling bad (appetite down, still with urine symptoms) after ABX done  Daughter took urine sample to Dr Sonda Primes this AM Brandi Melton says she feels that she is filling up with fluid  Spoke also to daughter   Concerned about her mother not feeling good  Also about fluid problems Told her I felt at least some of problem is dietary noncompliance with salt  Daughter agrees   I have placed call to Dr Loren Racer this AM  Waiting for call back

## 2016-09-03 ENCOUNTER — Other Ambulatory Visit: Payer: Medicare Other | Admitting: *Deleted

## 2016-09-03 DIAGNOSIS — I5033 Acute on chronic diastolic (congestive) heart failure: Secondary | ICD-10-CM | POA: Diagnosis not present

## 2016-09-03 LAB — BASIC METABOLIC PANEL
BUN/Creatinine Ratio: 26 (ref 12–28)
BUN: 32 mg/dL — ABNORMAL HIGH (ref 8–27)
CALCIUM: 8.9 mg/dL (ref 8.7–10.3)
CHLORIDE: 107 mmol/L — AB (ref 96–106)
CO2: 23 mmol/L (ref 18–29)
Creatinine, Ser: 1.22 mg/dL — ABNORMAL HIGH (ref 0.57–1.00)
GFR calc non Af Amer: 41 mL/min/{1.73_m2} — ABNORMAL LOW (ref >59)
GFR, EST AFRICAN AMERICAN: 47 mL/min/{1.73_m2} — AB (ref >59)
GLUCOSE: 182 mg/dL — AB (ref 65–99)
POTASSIUM: 4.7 mmol/L (ref 3.5–5.2)
Sodium: 144 mmol/L (ref 134–144)

## 2016-11-03 DIAGNOSIS — M109 Gout, unspecified: Secondary | ICD-10-CM | POA: Diagnosis not present

## 2016-11-03 DIAGNOSIS — D631 Anemia in chronic kidney disease: Secondary | ICD-10-CM | POA: Diagnosis not present

## 2016-11-03 DIAGNOSIS — I130 Hypertensive heart and chronic kidney disease with heart failure and stage 1 through stage 4 chronic kidney disease, or unspecified chronic kidney disease: Secondary | ICD-10-CM | POA: Diagnosis not present

## 2016-11-03 DIAGNOSIS — G4733 Obstructive sleep apnea (adult) (pediatric): Secondary | ICD-10-CM | POA: Diagnosis not present

## 2016-11-03 DIAGNOSIS — E1129 Type 2 diabetes mellitus with other diabetic kidney complication: Secondary | ICD-10-CM | POA: Diagnosis not present

## 2016-11-03 DIAGNOSIS — N184 Chronic kidney disease, stage 4 (severe): Secondary | ICD-10-CM | POA: Diagnosis not present

## 2016-11-03 DIAGNOSIS — D692 Other nonthrombocytopenic purpura: Secondary | ICD-10-CM | POA: Diagnosis not present

## 2016-11-03 DIAGNOSIS — I5032 Chronic diastolic (congestive) heart failure: Secondary | ICD-10-CM | POA: Diagnosis not present

## 2016-11-03 DIAGNOSIS — E1151 Type 2 diabetes mellitus with diabetic peripheral angiopathy without gangrene: Secondary | ICD-10-CM | POA: Diagnosis not present

## 2016-11-03 DIAGNOSIS — Z794 Long term (current) use of insulin: Secondary | ICD-10-CM | POA: Diagnosis not present

## 2016-11-03 DIAGNOSIS — Z6841 Body Mass Index (BMI) 40.0 and over, adult: Secondary | ICD-10-CM | POA: Diagnosis not present

## 2016-12-05 ENCOUNTER — Telehealth: Payer: Self-pay | Admitting: Internal Medicine

## 2016-12-05 NOTE — Telephone Encounter (Signed)
Patient has been scheduled with APP for this Monday.  Will route to Dr. Harrington Challenger to inform.

## 2016-12-05 NOTE — Telephone Encounter (Signed)
Reviewed schedule.  Called scheduler who took the call and asked her to call back and offer 12/08/16 opening with Robbie Lis, APP at 2:00 pm.  Dr. Harrington Challenger is in clinic also that day and will be available to discuss/see patient also if needed.

## 2016-12-05 NOTE — Telephone Encounter (Signed)
New Message  Pt daughter called requesting to speak with RN about getting pt a sooner appt within the next two weeks. Pt daughter states pt is really not doing good and she really need to get in soon to see someone. Please call back to discuss

## 2016-12-08 ENCOUNTER — Encounter: Payer: Self-pay | Admitting: Physician Assistant

## 2016-12-08 ENCOUNTER — Ambulatory Visit (INDEPENDENT_AMBULATORY_CARE_PROVIDER_SITE_OTHER): Payer: Medicare Other | Admitting: Physician Assistant

## 2016-12-08 ENCOUNTER — Telehealth: Payer: Self-pay | Admitting: Internal Medicine

## 2016-12-08 VITALS — BP 128/70 | HR 78 | Ht 63.0 in | Wt 248.8 lb

## 2016-12-08 DIAGNOSIS — I110 Hypertensive heart disease with heart failure: Secondary | ICD-10-CM | POA: Diagnosis not present

## 2016-12-08 DIAGNOSIS — I10 Essential (primary) hypertension: Secondary | ICD-10-CM | POA: Diagnosis not present

## 2016-12-08 DIAGNOSIS — G4730 Sleep apnea, unspecified: Secondary | ICD-10-CM

## 2016-12-08 DIAGNOSIS — I5032 Chronic diastolic (congestive) heart failure: Secondary | ICD-10-CM | POA: Diagnosis not present

## 2016-12-08 DIAGNOSIS — Z794 Long term (current) use of insulin: Secondary | ICD-10-CM | POA: Diagnosis not present

## 2016-12-08 DIAGNOSIS — E119 Type 2 diabetes mellitus without complications: Secondary | ICD-10-CM

## 2016-12-08 DIAGNOSIS — I5033 Acute on chronic diastolic (congestive) heart failure: Secondary | ICD-10-CM

## 2016-12-08 NOTE — Telephone Encounter (Signed)
Brandi Melton is wanting to know if the new prescription( Does not know the name of the new prescription)  has been sent to [facility] for her and if there are any changes . Please call

## 2016-12-08 NOTE — Telephone Encounter (Signed)
Patient was calling in regard to lab results and any potential medicine changes.  Advised labs have not resulted yet and that she would be called once they are reviewed by provider.  Pt will wait to hear from [place] regarding any changes.

## 2016-12-08 NOTE — Progress Notes (Signed)
Cardiology Office Note    Date:  12/08/2016   ID:  Brandi Melton 1930-07-26, MRN [id number]  PCP:  Brandi Pao, MD  Cardiologist:  Dr. Harrington Challenger  Chief Complaint: "not doing good"  History of Present Illness:   Brandi Melton is a 83 y.o. female with hx of chronic diastolic CHF, HTN, HL, OSA, CKD stage III, RA and DM presented for evaluation.   Last echo 06/2016 showed normal LVEF, no wm abnormality, grade 1 DD and elevated LVEDP.   She was volume overloaded when last seen by Dr. Harrington Challenger 07/2016.  The patient is here for evaluation of feeling poor for the past 2 weeks. She complains of dyspnea on exertion, worsening recently. Resolved with rest. She sleeps in the recliner. No chest pain, palpitations, dizziness, syncope or melena. Intermittent lower extremity edema. She does not check her weight regularly. She does not want CPAP.    Past Medical History:  Diagnosis Date  . Arthritis    "hips before they were replaced, right knee now" (11/09/2015)  . Chronic diastolic (congestive) heart failure (HCC)    a. echo 06/2015: EF 65-70%, Grade 1 DD, mitral valve calcification.  . CKD (chronic kidney disease), stage III   . Depression   . H/O cardiac catheterization    a. h/o cardiac catheterization 05/2013 in [place] showing only minimal CAD with increased filling pressure.  . Hyperlipemia   . Hypertensive heart disease   . Morbid obesity ([place])   . Palsy, Bell's   . RA (rheumatoid arthritis) ([place])   . Sleep apnea    "suppose to wear mask; couldn't" (11/09/2015)  . Type II diabetes mellitus ([place])     Past Surgical History:  Procedure Laterality Date  . APPENDECTOMY  1966  . CARDIAC CATHETERIZATION  05/2013  . CARPAL TUNNEL RELEASE Right 08/08/2014   Procedure: OPEN CARPAL TUNNEL RELEASE;  Surgeon: Roseanne Kaufman, MD;  Location: [facility];  Service: Orthopedics;  Laterality: Right;  . CATARACT EXTRACTION W/ INTRAOCULAR LENS  IMPLANT, BILATERAL Bilateral   . CHOLECYSTECTOMY OPEN  1966   . COLONOSCOPY    . FRACTURE SURGERY    . JOINT REPLACEMENT    . KNEE ARTHROPLASTY Left   . ORIF WRIST FRACTURE Right 08/08/2014   Procedure: Right OPEN REDUCTION INTERNAL FIXATION (ORIF) WRIST FRACTURE WITH REPAIR AND RECONST/ALLOGRAFT AND BONE GRAFT;  Surgeon: Roseanne Kaufman, MD;  Location: [facility];  Service: Orthopedics;  Laterality: Right;  . TOTAL HIP ARTHROPLASTY Bilateral     Current Medications: Prior to Admission medications   Medication Sig Start Date End Date Taking? Authorizing Provider  allopurinol (ZYLOPRIM) 300 MG tablet Take 300 mg by mouth at bedtime.  02/13/16   [provider]  aspirin EC 81 MG tablet Take 81 mg by mouth daily.    [provider]  calcium carbonate (TUMS EX) 750 MG chewable tablet Chew 1 tablet by mouth daily.    [provider]  carvedilol (COREG CR) 80 MG 24 hr capsule Take 1 capsule (80 mg total) by mouth daily. 07/07/16   Fay Records, MD  chlorhexidine (PERIDEX) 0.12 % solution Use as directed 5 mLs in the mouth or throat 2 (two) times daily.  01/17/15   [provider]  cholecalciferol (VITAMIN D) 1000 units tablet Take 3,000 Units by mouth daily.    [provider]  ezetimibe-simvastatin (VYTORIN) 10-40 MG tablet Take 1 tablet by mouth daily. 07/07/16   Fay Records, MD  folic acid Darnelle Catalan)  400 MCG tablet Take 400 mcg by mouth daily.    [provider]  insulin aspart (NOVOLOG) 100 UNIT/ML injection Inject 35 Units into the skin 4 (four) times daily.     [provider]  Insulin Detemir (LEVEMIR) 100 UNIT/ML Pen Inject 80 Units into the skin 2 (two) times daily.     [provider]  magnesium oxide (MAG-OX) 400 MG tablet Take 1 tablet (400 mg total) by mouth daily. 07/07/16   Fay Records, MD  potassium chloride SA (K-DUR,KLOR-CON) 20 MEQ tablet Take 1 tablet (20 mEq total) by mouth daily. 03/31/16   Richardson Dopp T, PA-C  sulfamethoxazole-trimethoprim (BACTRIM DS,SEPTRA DS) 800-160  MG tablet Take 1 tablet by mouth 2 (two) times daily. For 10 days 08/13/16   Fay Records, MD  telmisartan (MICARDIS) 20 MG tablet Take 1 tablet (20 mg total) by mouth daily. 07/07/16   Fay Records, MD  torsemide (DEMADEX) 20 MG tablet Take 2 tablets (40 mg total) by mouth daily. 07/07/16   Fay Records, MD  Vitamin D, Ergocalciferol, (DRISDOL) 50000 UNITS CAPS capsule Take 50,000 Units by mouth every Wednesday.     [provider]    Allergies:   Patient has no known allergies.   Social History   Social History  . Marital status: Widowed    Spouse name: N/A  . Number of children: N/A  . Years of education: N/A   Social History Main Topics  . Smoking status: Never Smoker  . Smokeless tobacco: Never Used  . Alcohol use 0.0 oz/week     Comment: 11/09/2015 "might have glass of champaign on special occasion"  . Drug use: No  . Sexual activity: No   Other Topics Concern  . None   Social History Narrative  . None     Family History:  The patient's family history includes Stroke in her mother; Sudden death in her mother.   ROS:   Please see the history of present illness.    ROS All other systems reviewed and are negative.   PHYSICAL EXAM:   VS:  BP 128/70   Pulse 78   Ht 5\' 3"  (1.6 m)   Wt 248 lb 12.8 oz (112.9 kg)   SpO2 93%   BMI 44.07 kg/m    GEN: obese female  in no acute distress  HEENT: normal  Neck: no JVD, carotid bruits, or masses Cardiac: RRR; no murmurs, rubs, or gallops,no edema  Respiratory:  clear to auscultation bilaterally, normal work of breathing GI: soft, nontender, nondistended, + BS MS: no deformity or atrophy  Skin: warm and dry, no rash Neuro:  Alert and Oriented x 3, Strength and sensation are intact Psych: euthymic mood, full affect  Wt Readings from Last 3 Encounters:  12/08/16 248 lb 12.8 oz (112.9 kg)  08/11/16 252 lb 12.8 oz (114.7 kg)  07/07/16 253 lb 12.8 oz (115.1 kg)      Studies/Labs Reviewed:   EKG:  EKG is not  ordered today.   Recent Labs: 02/04/2016: Magnesium 2.4 02/18/2016: ALT 18; B Natriuretic Peptide 99.9; Hemoglobin 11.3; Platelets 225 07/07/2016: NT-Pro BNP 146; TSH 2.940 09/03/2016: BUN 32; Creatinine, Ser 1.22; Potassium 4.7; Sodium 144   Lipid Panel No results found for: CHOL, TRIG, HDL, CHOLHDL, VLDL, LDLCALC, LDLDIRECT  Additional studies/ records that were reviewed today include:   As above  ASSESSMENT & PLAN:    1. Acute on chronic diastolic CHF  - She does not have a  volume overload on exam. Her oxygen level is 93% on room air at rest. With minimal ambulation it dropped to 88% with severe SOB. Given untreated sleep apnea and obesity suspecting obsesity hyperventilation syndrome. Advised weight loss. Get BMET, BNP stat. Her Oxygen saturation level was 93% on room air at rest. With minimal ambulation it dropped to 88% with severe shortness of breath and wheezing. - If certain etiology, consider PFT versus pulmonary evaluation.  2. HTN - Stable and well controlled on current regimen   3. HLD - continue statin  4. OSA - does not want CPAP  5. DM - Per PCP   Medication Adjustments/Labs and Tests Ordered: Current medicines are reviewed at length with the patient today.  Concerns regarding medicines are outlined above.  Medication changes, Labs and Tests ordered today are listed in the Patient Instructions below. There are no Patient Instructions on file for this visit.   Jarrett Soho, [place]  12/08/2016 2:31 PM    [facility] [address], [place], [place]  [address] Phone: [phone]; Fax: [phone]

## 2016-12-08 NOTE — Patient Instructions (Signed)
Medication Instructions:  Your physician recommends that you continue on your current medications as directed. Please refer to the Current Medication list given to you today.  Labwork: Your physician recommends that you return for lab work in: TODAY-STAT-BNP, BMP   Testing/Procedures: None   Follow-Up: Your physician recommends that you schedule a follow-up appointment in: 12/18/2016--PER DR ROSS OK TO OVER BOOK WITH HER ON THIS DAY-WHAT EVER TIME IS CONVENIENT WITH THE PATIENT    Any Other Special Instructions Will Be Listed Below (If Applicable).  If you need a refill on your cardiac medications before your next appointment, please call your pharmacy.

## 2016-12-09 ENCOUNTER — Telehealth: Payer: Self-pay | Admitting: Internal Medicine

## 2016-12-09 DIAGNOSIS — R7989 Other specified abnormal findings of blood chemistry: Secondary | ICD-10-CM

## 2016-12-09 DIAGNOSIS — Z79899 Other long term (current) drug therapy: Secondary | ICD-10-CM

## 2016-12-09 LAB — BASIC METABOLIC PANEL
BUN / CREAT RATIO: 16 (ref 12–28)
BUN: 19 mg/dL (ref 8–27)
CHLORIDE: 106 mmol/L (ref 96–106)
CO2: 23 mmol/L (ref 20–29)
Calcium: 9 mg/dL (ref 8.7–10.3)
Creatinine, Ser: 1.2 mg/dL — ABNORMAL HIGH (ref 0.57–1.00)
GFR calc non Af Amer: 42 mL/min/{1.73_m2} — ABNORMAL LOW (ref >59)
GFR, EST AFRICAN AMERICAN: 48 mL/min/{1.73_m2} — AB (ref >59)
Glucose: 343 mg/dL — ABNORMAL HIGH (ref 65–99)
POTASSIUM: 5.2 mmol/L (ref 3.5–5.2)
Sodium: 144 mmol/L (ref 134–144)

## 2016-12-09 LAB — PRO B NATRIURETIC PEPTIDE: NT-PRO BNP: 231 pg/mL (ref 0–738)

## 2016-12-09 MED ORDER — METOLAZONE 5 MG PO TABS: 5.0000 mg | ORAL_TABLET | ORAL | 3 refills | Status: DC

## 2016-12-09 NOTE — Telephone Encounter (Signed)
New message   Pt is very irate because prescriptions were supposed to be sent yesterday and the results were supposed to be received already. She requests a call back asap

## 2016-12-09 NOTE — Telephone Encounter (Signed)
Pt seen in clinic yesterday. I did not receive labs back until today Recomm that she keep on Lasix. Add Zaroxyln 5 mg before AM dose of torsemide   Take Zaroxyln every other day at first  May be too much if daily On review I think she should come back Monday now for BMET  Watch salt

## 2016-12-09 NOTE — Telephone Encounter (Signed)
Called patient about Dr. Harrington Challenger' message. Patient will come in on Tuesday for lab work, due to office being closed on Monday. Patient will start zaroxolyn tomorrow morning and take it every other day before her torsemide. Patient verbalized understanding.

## 2016-12-16 ENCOUNTER — Other Ambulatory Visit: Payer: Medicare Other

## 2016-12-17 ENCOUNTER — Other Ambulatory Visit: Payer: Medicare Other | Admitting: *Deleted

## 2016-12-17 DIAGNOSIS — R7989 Other specified abnormal findings of blood chemistry: Secondary | ICD-10-CM

## 2016-12-17 DIAGNOSIS — Z79899 Other long term (current) drug therapy: Secondary | ICD-10-CM

## 2016-12-18 ENCOUNTER — Ambulatory Visit (INDEPENDENT_AMBULATORY_CARE_PROVIDER_SITE_OTHER): Payer: Medicare Other | Admitting: Internal Medicine

## 2016-12-18 ENCOUNTER — Encounter (INDEPENDENT_AMBULATORY_CARE_PROVIDER_SITE_OTHER): Payer: Self-pay

## 2016-12-18 ENCOUNTER — Encounter: Payer: Self-pay | Admitting: Internal Medicine

## 2016-12-18 VITALS — BP 110/62 | HR 85 | Ht 63.0 in | Wt 248.8 lb

## 2016-12-18 DIAGNOSIS — I10 Essential (primary) hypertension: Secondary | ICD-10-CM | POA: Diagnosis not present

## 2016-12-18 DIAGNOSIS — I5033 Acute on chronic diastolic (congestive) heart failure: Secondary | ICD-10-CM

## 2016-12-18 DIAGNOSIS — G4730 Sleep apnea, unspecified: Secondary | ICD-10-CM

## 2016-12-18 DIAGNOSIS — I5032 Chronic diastolic (congestive) heart failure: Secondary | ICD-10-CM

## 2016-12-18 DIAGNOSIS — E782 Mixed hyperlipidemia: Secondary | ICD-10-CM

## 2016-12-18 LAB — BASIC METABOLIC PANEL
BUN/Creatinine Ratio: 18 (ref 12–28)
BUN: 22 mg/dL (ref 8–27)
CALCIUM: 9 mg/dL (ref 8.7–10.3)
CHLORIDE: 104 mmol/L (ref 96–106)
CO2: 24 mmol/L (ref 20–29)
CREATININE: 1.2 mg/dL — AB (ref 0.57–1.00)
GFR calc non Af Amer: 42 mL/min/{1.73_m2} — ABNORMAL LOW (ref >59)
GFR, EST AFRICAN AMERICAN: 48 mL/min/{1.73_m2} — AB (ref >59)
GLUCOSE: 194 mg/dL — AB (ref 65–99)
Potassium: 5.1 mmol/L (ref 3.5–5.2)
Sodium: 143 mmol/L (ref 134–144)

## 2016-12-18 NOTE — Progress Notes (Signed)
Cardiology Office Note    Date:  12/18/2016   ID:  Brandi, Melton [birth year], MRN [id number]  PCP:  Haywood Pao, MD  Cardiologist:  Dr. Harrington Challenger Chief Complaint: Pt rpe  History of Present Illness:   Brandi Melton is a 83 y.o. female with hx of chronic diastolic CHF, HTN, HL, OSA, CKD stage III, RA and DM presented for evaluation.   Last echo 06/2016 showed normal LVEF, no wm abnormality, grade 1 DD and elevated LVEDP. I saw the pt in clinic in March  She has been seen by a few APPs since  I did see her a week ago with B BHagat At that time her wt was increased and she was complaining of SOB and edema She does not weigh herselft at home  It was recomm that she add Zaroxylyn everother day    Since seen she has done this and she notes no real change in her urination  Taking Zaroxylyn 15 min before Torsemide.   She says her breating is still the same  Still SOB    Past Medical History:  Diagnosis Date  . Arthritis    "hips before they were replaced, right knee now" (11/09/2015)  . Chronic diastolic (congestive) heart failure (HCC)    a. echo 06/2015: EF 65-70%, Grade 1 DD, mitral valve calcification.  . CKD (chronic kidney disease), stage III   . Depression   . H/O cardiac catheterization    a. h/o cardiac catheterization 05/2013 in [place] showing only minimal CAD with increased filling pressure.  . Hyperlipemia   . Hypertensive heart disease   . Morbid obesity ([facility])   . Palsy, Bell's   . RA (rheumatoid arthritis) ([facility])   . Sleep apnea    "suppose to wear mask; couldn't" (11/09/2015)  . Type II diabetes mellitus ([facility])     Past Surgical History:  Procedure Laterality Date  . APPENDECTOMY  1966  . CARDIAC CATHETERIZATION  05/2013  . CARPAL TUNNEL RELEASE Right 08/08/2014   Procedure: OPEN CARPAL TUNNEL RELEASE;  Surgeon: Roseanne Kaufman, MD;  Location: [facility];  Service: Orthopedics;  Laterality: Right;  . CATARACT EXTRACTION W/ INTRAOCULAR LENS  IMPLANT, BILATERAL  Bilateral   . CHOLECYSTECTOMY OPEN  1966  . COLONOSCOPY    . FRACTURE SURGERY    . JOINT REPLACEMENT    . KNEE ARTHROPLASTY Left   . ORIF WRIST FRACTURE Right 08/08/2014   Procedure: Right OPEN REDUCTION INTERNAL FIXATION (ORIF) WRIST FRACTURE WITH REPAIR AND RECONST/ALLOGRAFT AND BONE GRAFT;  Surgeon: Roseanne Kaufman, MD;  Location: [facility];  Service: Orthopedics;  Laterality: Right;  . TOTAL HIP ARTHROPLASTY Bilateral     Current Medications: Prior to Admission medications   Medication Sig Start Date End Date Taking? Authorizing Provider  allopurinol (ZYLOPRIM) 300 MG tablet Take 300 mg by mouth at bedtime.  02/13/16   [provider]  aspirin EC 81 MG tablet Take 81 mg by mouth daily.    [provider]  calcium carbonate (TUMS EX) 750 MG chewable tablet Chew 1 tablet by mouth daily.    [provider]  carvedilol (COREG CR) 80 MG 24 hr capsule Take 1 capsule (80 mg total) by mouth daily. 07/07/16   Fay Records, MD  chlorhexidine (PERIDEX) 0.12 % solution Use as directed 5 mLs in the mouth or throat 2 (two) times daily.  01/17/15   [provider]  cholecalciferol (VITAMIN D) 1000 units tablet Take 3,000 Units by mouth daily.  [provider]  ezetimibe-simvastatin (VYTORIN) 10-40 MG tablet Take 1 tablet by mouth daily. 07/07/16   Fay Records, MD  folic acid (FOLVITE) 322 MCG tablet Take 400 mcg by mouth daily.    [provider]  insulin aspart (NOVOLOG) 100 UNIT/ML injection Inject 35 Units into the skin 4 (four) times daily.     [provider]  Insulin Detemir (LEVEMIR) 100 UNIT/ML Pen Inject 80 Units into the skin 2 (two) times daily.     [provider]  magnesium oxide (MAG-OX) 400 MG tablet Take 1 tablet (400 mg total) by mouth daily. 07/07/16   Fay Records, MD  potassium chloride SA (K-DUR,KLOR-CON) 20 MEQ tablet Take 1 tablet (20 mEq total) by mouth daily. 03/31/16   Richardson Dopp T, PA-C    sulfamethoxazole-trimethoprim (BACTRIM DS,SEPTRA DS) 800-160 MG tablet Take 1 tablet by mouth 2 (two) times daily. For 10 days 08/13/16   Fay Records, MD  telmisartan (MICARDIS) 20 MG tablet Take 1 tablet (20 mg total) by mouth daily. 07/07/16   Fay Records, MD  torsemide (DEMADEX) 20 MG tablet Take 2 tablets (40 mg total) by mouth daily. 07/07/16   Fay Records, MD  Vitamin D, Ergocalciferol, (DRISDOL) 50000 UNITS CAPS capsule Take 50,000 Units by mouth every Wednesday.     [provider]    Allergies:   Patient has no known allergies.   Social History   Social History  . Marital status: Widowed    Spouse name: N/A  . Number of children: N/A  . Years of education: N/A   Social History Main Topics  . Smoking status: Never Smoker  . Smokeless tobacco: Never Used  . Alcohol use 0.0 oz/week     Comment: 11/09/2015 "might have glass of champaign on special occasion"  . Drug use: No  . Sexual activity: No   Other Topics Concern  . None   Social History Narrative  . None     Family History:  The patient's family history includes Stroke in her mother; Sudden death in her mother.   ROS:   Please see the history of present illness.    ROS All other systems reviewed and are negative.   PHYSICAL EXAM:   VS:  BP 110/62 (BP Location: Right Arm)   Pulse 85   Ht 5\' 3"  (1.6 m)   Wt 248 lb 12.8 oz (112.9 kg)   BMI 44.07 kg/m    GEN: obese female  in no acute distress  HEENT: normal  Neck: Difficult to assess JVP, carotid bruits, or masses Cardiac: RRR; no murmurs, rubs, or gallops, 2+   edema  Respiratory:  clear to auscultation bilaterally, normal work of breathing GI: soft   OBese   Nontender MS: no deformity  Skin: warm and dry, no rash Neuro:  Alert and Oriented x 3, Strength and sensation are intact Psych: euthymic mood, full affect  Wt Readings from Last 3 Encounters:  12/18/16 248 lb 12.8 oz (112.9 kg)  12/08/16 248 lb 12.8 oz (112.9 kg)  08/11/16 252 lb  12.8 oz (114.7 kg)      Studies/Labs Reviewed:   EKG:  EKG is  ordered today.   SR 85 bpm  LAFB   Poor R wave progression    Recent Labs: 02/04/2016: Magnesium 2.4 02/18/2016: ALT 18; B Natriuretic Peptide 99.9; Hemoglobin 11.3; Platelets 225 07/07/2016: TSH 2.940 12/08/2016: NT-Pro BNP 231 12/17/2016: BUN 22; Creatinine, Ser 1.20; Potassium 5.1; Sodium 143  Lipid Panel No results found for: CHOL, TRIG, HDL, CHOLHDL, VLDL, LDLCALC, LDLDIRECT  Additional studies/ records that were reviewed today include:   As above  ASSESSMENT & PLAN:    Acute on chronic diastolic CHF   Volume status is still up   I am not sure this explains all er SOB  She does have pulm issues I would recomm a trial of torsemide 40 mg in AM (instead of 20 bid)with every other day Zaroxylyn 5 mg (39 min prior )  Contact pt next week to see response.   If no response may need to plan for R hart cath.  2. HTN  BP is adequately controlled  Keep on same regimen   3. HLD - continue statin  4. OSA - does not want CPAP    5. DM - Per PCP    Signed, Dorris Carnes, MD  12/18/2016 11:33 AM    [facility] [place], [place], [place]  [address] Phone: [phone]; Fax: [phone]

## 2016-12-18 NOTE — Patient Instructions (Addendum)
Demadex: (torsemide) 20 mg--take 2 tablets (40 mg) once a day Metolazone (zaroxolyn) 5 mg --take 30 minutes prior to demadex every other day  Dr. Harrington Challenger will call you on Tuesday for an update.

## 2016-12-26 ENCOUNTER — Telehealth: Payer: Self-pay | Admitting: Internal Medicine

## 2016-12-26 NOTE — Telephone Encounter (Signed)
Left msg on VM Asked how pt was feeling  Breathing?  Edema? On VM said I would call back  Informed pt I was in clinic on Monday as well

## 2016-12-29 ENCOUNTER — Ambulatory Visit: Payer: Medicare Other | Admitting: Internal Medicine

## 2016-12-29 NOTE — Telephone Encounter (Signed)
Left message on voice mail to call back tomorrow to let Dr. Harrington Challenger know how she is feeling, how is her swelling, how is her breathing?

## 2016-12-30 ENCOUNTER — Telehealth: Payer: Self-pay | Admitting: Internal Medicine

## 2016-12-30 DIAGNOSIS — I5033 Acute on chronic diastolic (congestive) heart failure: Secondary | ICD-10-CM

## 2016-12-30 NOTE — Telephone Encounter (Signed)
Brandi Melton is returning a Call . Thanks

## 2016-12-30 NOTE — Telephone Encounter (Signed)
Patient stated she is doing better with some improvement in her edema. Patient thinks her breathing is about the same. Patient did not sound SOB on the phone. Patient stated her BLE look better and her diarrhea went away. Informed patient that an update would be sent to Dr. Harrington Challenger and if there are any changes, our office will call her back. Patient verbalized understanding.

## 2017-01-01 NOTE — Telephone Encounter (Signed)
No changes to medication list. Lab orders placed. Appointment made for 01/05/17.

## 2017-01-01 NOTE — Telephone Encounter (Signed)
Called pt   She is feeling better  Legs are improving  Breathing is some better  She is urinating more with 2 diuretics (takes Zaroxylynevery other day and torsemide 40 daily) Recomm BMET and BNP on Monday

## 2017-01-05 ENCOUNTER — Other Ambulatory Visit: Payer: Medicare Other | Admitting: *Deleted

## 2017-01-05 DIAGNOSIS — I5033 Acute on chronic diastolic (congestive) heart failure: Secondary | ICD-10-CM

## 2017-01-06 LAB — PRO B NATRIURETIC PEPTIDE: NT-PRO BNP: 133 pg/mL (ref 0–738)

## 2017-01-06 LAB — BASIC METABOLIC PANEL
BUN / CREAT RATIO: 32 — AB (ref 12–28)
BUN: 34 mg/dL — AB (ref 8–27)
CO2: 29 mmol/L (ref 20–29)
CREATININE: 1.06 mg/dL — AB (ref 0.57–1.00)
Calcium: 9.3 mg/dL (ref 8.7–10.3)
Chloride: 101 mmol/L (ref 96–106)
GFR, EST AFRICAN AMERICAN: 56 mL/min/{1.73_m2} — AB (ref >59)
GFR, EST NON AFRICAN AMERICAN: 48 mL/min/{1.73_m2} — AB (ref >59)
Glucose: 213 mg/dL — ABNORMAL HIGH (ref 65–99)
Potassium: 5 mmol/L (ref 3.5–5.2)
Sodium: 143 mmol/L (ref 134–144)

## 2017-01-08 ENCOUNTER — Telehealth: Payer: Self-pay | Admitting: Internal Medicine

## 2017-01-08 NOTE — Telephone Encounter (Signed)
Pt is aware of lab results. Pt states that prior have her labs drown she over did it her family celebrated her grandson's  birthday and she  overdid it. Pt states she is feeling fine.

## 2017-01-08 NOTE — Telephone Encounter (Signed)
New Message     Will be home for the next 2 hours , then she will not be home,  Do not call at dinner time she will not answer the phone.   Brandi Melton called her yesterday to give her lab results

## 2017-01-27 NOTE — Progress Notes (Signed)
Cardiology Office Note    Date:  01/29/2017   ID:  Brandi, Melton Oct 25, 1930, MRN [id number]  PCP:  Brandi Pao, MD  Cardiologist:  Dr. Harrington Melton  Chief Complaint: SOB  follow up  History of Present Illness:   Brandi Melton is a 83 y.o. female  hx of chronic diastolic CHF, HTN, HL, OSA, CKD stage III, RA and DM presents for follow up.   Last echo 06/2016 showed normal LVEF, no wm abnormality, grade 1 DD and elevated LVEDP.   Last seen by Dr. Harrington Melton 12/18/16 for volume overload. Increase torsemide to 40mg  in AM and Zaroxolyn 5mg  every other day. Symptoms improved. Her SOB also felt 2nd to pulmonary issue. If no response may need to plan for R hart cath.  Here today for follow up. She has lost 4lb since last office visit. She continues to drink almost 1 gallon of sweet tea every day with sodium reach soap in the lunch. She has a extreme shortness of breath with walking and needs to take frequent stops. She hasn't tried CPAP  in past 3 years. Lots of family drama during my visit. The patient and daughter was arguing on multiple issue.   Past Medical History:  Diagnosis Date  . Arthritis    "hips before they were replaced, right knee now" (11/09/2015)  . Chronic diastolic (congestive) heart failure (HCC)    a. echo 06/2015: EF 65-70%, Grade 1 DD, mitral valve calcification.  . CKD (chronic kidney disease), stage III   . Depression   . H/O cardiac catheterization    a. h/o cardiac catheterization 05/2013 in [place] showing only minimal CAD with increased filling pressure.  . Hyperlipemia   . Hypertensive heart disease   . Morbid obesity ([facility])   . Palsy, Bell's   . RA (rheumatoid arthritis) ([facility])   . Sleep apnea    "suppose to wear mask; couldn't" (11/09/2015)  . Type II diabetes mellitus (Brandi Melton)     Past Surgical History:  Procedure Laterality Date  . APPENDECTOMY  1966  . CARDIAC CATHETERIZATION  05/2013  . CARPAL TUNNEL RELEASE Right 08/08/2014   Procedure: OPEN CARPAL TUNNEL  RELEASE;  Surgeon: Brandi Kaufman, MD;  Location: [facility];  Service: Orthopedics;  Laterality: Right;  . CATARACT EXTRACTION W/ INTRAOCULAR LENS  IMPLANT, BILATERAL Bilateral   . CHOLECYSTECTOMY OPEN  1966  . COLONOSCOPY    . FRACTURE SURGERY    . JOINT REPLACEMENT    . KNEE ARTHROPLASTY Left   . ORIF WRIST FRACTURE Right 08/08/2014   Procedure: Right OPEN REDUCTION INTERNAL FIXATION (ORIF) WRIST FRACTURE WITH REPAIR AND RECONST/ALLOGRAFT AND BONE GRAFT;  Surgeon: Brandi Kaufman, MD;  Location: [facility];  Service: Orthopedics;  Laterality: Right;  . TOTAL HIP ARTHROPLASTY Bilateral     Current Medications: Prior to Admission medications   Medication Sig Start Date End Date Taking? Authorizing Provider  allopurinol (ZYLOPRIM) 300 MG tablet Take 300 mg by mouth at bedtime.  02/13/16   [provider]  aspirin EC 81 MG tablet Take 81 mg by mouth daily.    [provider]  calcium carbonate (TUMS EX) 750 MG chewable tablet Chew 1 tablet by mouth daily.    [provider]  carvedilol (COREG CR) 80 MG 24 hr capsule Take 1 capsule (80 mg total) by mouth daily. 07/07/16   Fay Records, MD  chlorhexidine (PERIDEX) 0.12 % solution Use as directed 5 mLs in the mouth or throat 2 (two) times  daily.  01/17/15   [provider]  cholecalciferol (VITAMIN D) 1000 units tablet Take 3,000 Units by mouth daily.    [provider]  ezetimibe-simvastatin (VYTORIN) 10-40 MG tablet Take 1 tablet by mouth daily. 07/07/16   Fay Records, MD  folic acid (FOLVITE) 742 MCG tablet Take 400 mcg by mouth daily.    [provider]  insulin aspart (NOVOLOG) 100 UNIT/ML injection Inject 35 Units into the skin 4 (four) times daily.     [provider]  Insulin Detemir (LEVEMIR) 100 UNIT/ML Pen Inject 80 Units into the skin 2 (two) times daily.     [provider]  magnesium oxide (MAG-OX) 400 MG tablet Take 1 tablet (400 mg total) by mouth daily. 07/07/16   Fay Records, MD  metolazone (ZAROXOLYN) 5 MG tablet Take 1 tablet (5 mg total) by mouth every other day. 12/09/16 12/07/17  Fay Records, MD  potassium chloride SA (K-DUR,KLOR-CON) 20 MEQ tablet Take 1 tablet (20 mEq total) by mouth daily. 03/31/16   Brandi Melton T, PA-C  sulfamethoxazole-trimethoprim (BACTRIM DS,SEPTRA DS) 800-160 MG tablet Take 1 tablet by mouth 2 (two) times daily. For 10 days 08/13/16   Fay Records, MD  telmisartan (MICARDIS) 20 MG tablet Take 1 tablet (20 mg total) by mouth daily. 07/07/16   Fay Records, MD  torsemide (DEMADEX) 20 MG tablet Take 2 tablets (40 mg total) by mouth daily. 07/07/16   Fay Records, MD  Vitamin D, Ergocalciferol, (DRISDOL) 50000 UNITS CAPS capsule Take 50,000 Units by mouth every Wednesday.     [provider]    Allergies:   Patient has no known allergies.   Social History   Social History  . Marital status: Widowed    Spouse name: Brandi Melton  . Number of children: Brandi Melton  . Years of education: Brandi Melton   Social History Main Topics  . Smoking status: Never Smoker  . Smokeless tobacco: Never Used  . Alcohol use 0.0 oz/week     Comment: 11/09/2015 "might have glass of champaign on special occasion"  . Drug use: No  . Sexual activity: No   Other Topics Concern  . Not on file   Social History Narrative  . No narrative on file     Family History:  The patient's family history includes Stroke in her mother; Sudden death in her mother.   ROS:   Please see the history of present illness.    ROS All other systems reviewed and are negative.   PHYSICAL EXAM:   VS:  BP 118/60   Pulse 74   Resp 16   Ht 5\' 3"  (1.6 m)   Wt 244 lb (110.7 kg)   SpO2 94%   BMI 43.22 kg/m    GEN: Well nourished, well developed, in no acute distress  HEENT: normal  Neck: no JVD, carotid bruits, or masses Cardiac: RRR; no murmurs, rubs, or gallops, Trace edema on stocking Respiratory:  clear to auscultation bilaterally, normal work of breathing GI: soft,  nontender, nondistended, + BS MS: no deformity or atrophy  Skin: warm and dry, no rash Neuro:  Alert and Oriented x 3, Strength and sensation are intact Psych: euthymic mood, full affect  Wt Readings from Last 3 Encounters:  01/29/17 244 lb (110.7 kg)  12/18/16 248 lb 12.8 oz (112.9 kg)  12/08/16 248 lb 12.8 oz (112.9 kg)      Studies/Labs Reviewed:   EKG:  EKG is not ordered today.  Recent Labs: 02/04/2016: Magnesium 2.4 02/18/2016: ALT 18; B Natriuretic Peptide 99.9; Hemoglobin 11.3; Platelets 225 07/07/2016: TSH 2.940 01/05/2017: BUN 34; Creatinine, Ser 1.06; NT-Pro BNP 133; Potassium 5.0; Sodium 143   Lipid Panel No results found for: CHOL, TRIG, HDL, CHOLHDL, VLDL, LDLCALC, LDLDIRECT  Additional studies/ records that were reviewed today include:   As above  ASSESSMENT & PLAN:    1. Acute on Chronic diastolic heart failure - Lost 4 lb since last visit. Her main issue is obesity, drinking lost of fluid and sodium rich food. Advise to cut back. Education given for >15 minutes. Continue current medication. Oxygen saturation of 94% at room air. She was gasping air while walking in for appointment. Recommended getting oxygen saturation while waking. However declined multiple times despite education need and possible long term complication. Will refer PT for deconditioning. She does not want to follow up with her PCP. R heart cath per Dr. Harrington Melton.   2. Hypertension - Stable on current medications.   3. Hyperlipidemia - Continue statin   4. OSA  - Does not want CPAP  5. DM - PEr PCP Medication Adjustments/Labs and Tests Ordered: Current medicines are reviewed at length with the patient today.  Concerns regarding medicines are outlined above.  Medication changes, Labs and Tests ordered today are listed in the Patient Instructions below. Patient Instructions  Medication Instructions:  Your physician recommends that you continue on your current medications as directed. Please  refer to the Current Medication list given to you today.   Labwork: -None  Testing/Procedures: -None  Follow-Up: Your physician recommends that you keep your scheduled  follow-up appointment with Dr. Harrington Melton.   Any Other Special Instructions Will Be Listed Below (If Applicable).     If you need a refill on your cardiac medications before your next appointment, please call your pharmacy.      Jarrett Soho, [place]  01/29/2017 9:12 AM    [facility], [place], [place]  [address] Phone: [phone]; Fax: [phone]

## 2017-01-29 ENCOUNTER — Ambulatory Visit (INDEPENDENT_AMBULATORY_CARE_PROVIDER_SITE_OTHER): Payer: Medicare Other | Admitting: Physician Assistant

## 2017-01-29 ENCOUNTER — Encounter (INDEPENDENT_AMBULATORY_CARE_PROVIDER_SITE_OTHER): Payer: Self-pay

## 2017-01-29 VITALS — BP 118/60 | HR 74 | Resp 16 | Ht 63.0 in | Wt 244.0 lb

## 2017-01-29 DIAGNOSIS — E782 Mixed hyperlipidemia: Secondary | ICD-10-CM | POA: Diagnosis not present

## 2017-01-29 DIAGNOSIS — I110 Hypertensive heart disease with heart failure: Secondary | ICD-10-CM | POA: Diagnosis not present

## 2017-01-29 DIAGNOSIS — G4730 Sleep apnea, unspecified: Secondary | ICD-10-CM | POA: Diagnosis not present

## 2017-01-29 DIAGNOSIS — E119 Type 2 diabetes mellitus without complications: Secondary | ICD-10-CM | POA: Diagnosis not present

## 2017-01-29 DIAGNOSIS — I5033 Acute on chronic diastolic (congestive) heart failure: Secondary | ICD-10-CM

## 2017-01-29 DIAGNOSIS — Z794 Long term (current) use of insulin: Secondary | ICD-10-CM | POA: Diagnosis not present

## 2017-01-29 NOTE — Patient Instructions (Signed)
Medication Instructions:  Your physician recommends that you continue on your current medications as directed. Please refer to the Current Medication list given to you today.   Labwork: -None  Testing/Procedures: -None  Follow-Up: Your physician recommends that you keep your scheduled  follow-up appointment with Dr. Ross   Any Other Special Instructions Will Be Listed Below (If Applicable).     If you need a refill on your cardiac medications before your next appointment, please call your pharmacy.   

## 2017-02-11 DIAGNOSIS — N184 Chronic kidney disease, stage 4 (severe): Secondary | ICD-10-CM | POA: Diagnosis not present

## 2017-02-11 DIAGNOSIS — D631 Anemia in chronic kidney disease: Secondary | ICD-10-CM | POA: Diagnosis not present

## 2017-02-11 DIAGNOSIS — E1129 Type 2 diabetes mellitus with other diabetic kidney complication: Secondary | ICD-10-CM | POA: Diagnosis not present

## 2017-02-11 DIAGNOSIS — Z794 Long term (current) use of insulin: Secondary | ICD-10-CM | POA: Diagnosis not present

## 2017-02-11 DIAGNOSIS — I130 Hypertensive heart and chronic kidney disease with heart failure and stage 1 through stage 4 chronic kidney disease, or unspecified chronic kidney disease: Secondary | ICD-10-CM | POA: Diagnosis not present

## 2017-02-11 DIAGNOSIS — I5032 Chronic diastolic (congestive) heart failure: Secondary | ICD-10-CM | POA: Diagnosis not present

## 2017-02-11 DIAGNOSIS — I872 Venous insufficiency (chronic) (peripheral): Secondary | ICD-10-CM | POA: Diagnosis not present

## 2017-02-11 DIAGNOSIS — M109 Gout, unspecified: Secondary | ICD-10-CM | POA: Diagnosis not present

## 2017-02-11 DIAGNOSIS — Z6841 Body Mass Index (BMI) 40.0 and over, adult: Secondary | ICD-10-CM | POA: Diagnosis not present

## 2017-02-11 DIAGNOSIS — E7800 Pure hypercholesterolemia, unspecified: Secondary | ICD-10-CM | POA: Diagnosis not present

## 2017-02-11 DIAGNOSIS — E1151 Type 2 diabetes mellitus with diabetic peripheral angiopathy without gangrene: Secondary | ICD-10-CM | POA: Diagnosis not present

## 2017-02-11 DIAGNOSIS — R296 Repeated falls: Secondary | ICD-10-CM | POA: Diagnosis not present

## 2017-02-16 ENCOUNTER — Ambulatory Visit: Payer: Medicare Other | Admitting: Internal Medicine

## 2017-02-20 ENCOUNTER — Telehealth: Payer: Self-pay | Admitting: Internal Medicine

## 2017-02-20 NOTE — Telephone Encounter (Signed)
New message     Please call  Brandi Melton. p- [phone] or [phone]

## 2017-02-20 NOTE — Telephone Encounter (Addendum)
Called back to Mr. Sherwood.  He has multiple concerns about patient.   Said she has had a rough week.  Cant keep blood sugar under good control.   Taking insulin but not eating correctly.  Sleeping more.  Weights have been stable but she feels more swollen.  They discussed taking her to the ER but she is refusing to go.   I called daughter who is with patient.  She had taken herself to beauty shop (at [employer]) but felt she could not take herself back to her apt.   Daughter came and together they walked back and she drank some OJ and is starting to feel better.   I asked if patient has had any fever or confusion- doesn't think so. She is taking patient home with them for the weekend.  Pt already has appt scheduled with Dr. Harrington Challenger on Monday. Advised to go to ER anytime over the weekend with worsening symptoms or call office on call with any heart failure symptoms.  Left message for "DJ" at [facility] to fax recently labs.--pt was seen at PCP office "recently" and had lab work.

## 2017-02-23 ENCOUNTER — Encounter (INDEPENDENT_AMBULATORY_CARE_PROVIDER_SITE_OTHER): Payer: Self-pay

## 2017-02-23 ENCOUNTER — Encounter: Payer: Self-pay | Admitting: Internal Medicine

## 2017-02-23 ENCOUNTER — Encounter (HOSPITAL_COMMUNITY): Payer: Self-pay | Admitting: General Practice

## 2017-02-23 ENCOUNTER — Other Ambulatory Visit: Payer: Self-pay

## 2017-02-23 ENCOUNTER — Inpatient Hospital Stay (HOSPITAL_COMMUNITY)
Admission: AD | Admit: 2017-02-23 | Discharge: 2017-03-01 | DRG: 291 | Disposition: A | Discharge status: Home / Self Care | Payer: Medicare Other | Source: Ambulatory Visit | Attending: Internal Medicine | Admitting: Internal Medicine

## 2017-02-23 ENCOUNTER — Ambulatory Visit (INDEPENDENT_AMBULATORY_CARE_PROVIDER_SITE_OTHER): Payer: Medicare Other | Admitting: Internal Medicine

## 2017-02-23 VITALS — BP 112/52 | HR 76 | Ht 63.0 in | Wt 247.0 lb

## 2017-02-23 DIAGNOSIS — R0602 Shortness of breath: Secondary | ICD-10-CM | POA: Diagnosis not present

## 2017-02-23 DIAGNOSIS — G4733 Obstructive sleep apnea (adult) (pediatric): Secondary | ICD-10-CM | POA: Diagnosis present

## 2017-02-23 DIAGNOSIS — N183 Chronic kidney disease, stage 3 unspecified: Secondary | ICD-10-CM | POA: Diagnosis present

## 2017-02-23 DIAGNOSIS — M069 Rheumatoid arthritis, unspecified: Secondary | ICD-10-CM | POA: Diagnosis present

## 2017-02-23 DIAGNOSIS — Z7982 Long term (current) use of aspirin: Secondary | ICD-10-CM

## 2017-02-23 DIAGNOSIS — E785 Hyperlipidemia, unspecified: Secondary | ICD-10-CM | POA: Diagnosis present

## 2017-02-23 DIAGNOSIS — Z9111 Patient's noncompliance with dietary regimen: Secondary | ICD-10-CM

## 2017-02-23 DIAGNOSIS — R062 Wheezing: Secondary | ICD-10-CM | POA: Diagnosis not present

## 2017-02-23 DIAGNOSIS — I5033 Acute on chronic diastolic (congestive) heart failure: Secondary | ICD-10-CM | POA: Diagnosis present

## 2017-02-23 DIAGNOSIS — R0600 Dyspnea, unspecified: Secondary | ICD-10-CM

## 2017-02-23 DIAGNOSIS — N184 Chronic kidney disease, stage 4 (severe): Secondary | ICD-10-CM | POA: Diagnosis present

## 2017-02-23 DIAGNOSIS — I5032 Chronic diastolic (congestive) heart failure: Secondary | ICD-10-CM

## 2017-02-23 DIAGNOSIS — Z79899 Other long term (current) drug therapy: Secondary | ICD-10-CM

## 2017-02-23 DIAGNOSIS — I10 Essential (primary) hypertension: Secondary | ICD-10-CM

## 2017-02-23 DIAGNOSIS — F329 Major depressive disorder, single episode, unspecified: Secondary | ICD-10-CM | POA: Diagnosis present

## 2017-02-23 DIAGNOSIS — I509 Heart failure, unspecified: Secondary | ICD-10-CM | POA: Insufficient documentation

## 2017-02-23 DIAGNOSIS — I110 Hypertensive heart disease with heart failure: Secondary | ICD-10-CM | POA: Diagnosis present

## 2017-02-23 DIAGNOSIS — I130 Hypertensive heart and chronic kidney disease with heart failure and stage 1 through stage 4 chronic kidney disease, or unspecified chronic kidney disease: Principal | ICD-10-CM | POA: Diagnosis present

## 2017-02-23 DIAGNOSIS — R5383 Other fatigue: Secondary | ICD-10-CM | POA: Diagnosis not present

## 2017-02-23 DIAGNOSIS — I2510 Atherosclerotic heart disease of native coronary artery without angina pectoris: Secondary | ICD-10-CM | POA: Diagnosis present

## 2017-02-23 DIAGNOSIS — G510 Bell's palsy: Secondary | ICD-10-CM | POA: Diagnosis present

## 2017-02-23 DIAGNOSIS — E1122 Type 2 diabetes mellitus with diabetic chronic kidney disease: Secondary | ICD-10-CM | POA: Diagnosis present

## 2017-02-23 DIAGNOSIS — E876 Hypokalemia: Secondary | ICD-10-CM | POA: Diagnosis present

## 2017-02-23 DIAGNOSIS — Z6841 Body Mass Index (BMI) 40.0 and over, adult: Secondary | ICD-10-CM

## 2017-02-23 DIAGNOSIS — Z794 Long term (current) use of insulin: Secondary | ICD-10-CM

## 2017-02-23 HISTORY — DX: Dyspnea, unspecified: R06.00

## 2017-02-23 LAB — BRAIN NATRIURETIC PEPTIDE: B Natriuretic Peptide: 160.2 pg/mL — ABNORMAL HIGH (ref 0.0–100.0)

## 2017-02-23 LAB — COMPREHENSIVE METABOLIC PANEL
ALBUMIN: 2.9 g/dL — AB (ref 3.5–5.0)
ALK PHOS: 83 U/L (ref 38–126)
ALT: 28 U/L (ref 14–54)
ANION GAP: 9 (ref 5–15)
AST: 34 U/L (ref 15–41)
BUN: 56 mg/dL — AB (ref 6–20)
CALCIUM: 9.1 mg/dL (ref 8.9–10.3)
CO2: 31 mmol/L (ref 22–32)
Chloride: 100 mmol/L — ABNORMAL LOW (ref 101–111)
Creatinine, Ser: 1.87 mg/dL — ABNORMAL HIGH (ref 0.44–1.00)
GFR calc Af Amer: 27 mL/min — ABNORMAL LOW (ref >60)
GFR calc non Af Amer: 24 mL/min — ABNORMAL LOW (ref >60)
GLUCOSE: 182 mg/dL — AB (ref 65–99)
Potassium: 4 mmol/L (ref 3.5–5.1)
SODIUM: 140 mmol/L (ref 135–145)
Total Bilirubin: 0.8 mg/dL (ref 0.3–1.2)
Total Protein: 6.4 g/dL — ABNORMAL LOW (ref 6.5–8.1)

## 2017-02-23 LAB — GLUCOSE, CAPILLARY
GLUCOSE-CAPILLARY: 165 mg/dL — AB (ref 65–99)
Glucose-Capillary: 272 mg/dL — ABNORMAL HIGH (ref 65–99)

## 2017-02-23 LAB — CBC WITH DIFFERENTIAL/PLATELET
Basophils Absolute: 0 10*3/uL (ref 0.0–0.1)
Basophils Relative: 0 %
EOS PCT: 4 %
Eosinophils Absolute: 0.4 10*3/uL (ref 0.0–0.7)
HEMATOCRIT: 36.4 % (ref 36.0–46.0)
Hemoglobin: 11 g/dL — ABNORMAL LOW (ref 12.0–15.0)
LYMPHS PCT: 33 %
Lymphs Abs: 3.1 10*3/uL (ref 0.7–4.0)
MCH: 30 pg (ref 26.0–34.0)
MCHC: 30.2 g/dL (ref 30.0–36.0)
MCV: 99.2 fL (ref 78.0–100.0)
MONO ABS: 0.8 10*3/uL (ref 0.1–1.0)
MONOS PCT: 8 %
Neutro Abs: 5.1 10*3/uL (ref 1.7–7.7)
Neutrophils Relative %: 55 %
PLATELETS: 226 10*3/uL (ref 150–400)
RBC: 3.67 MIL/uL — ABNORMAL LOW (ref 3.87–5.11)
RDW: 16 % — AB (ref 11.5–15.5)
WBC: 9.4 10*3/uL (ref 4.0–10.5)

## 2017-02-23 LAB — PROTIME-INR
INR: 0.98
Prothrombin Time: 12.9 seconds (ref 11.4–15.2)

## 2017-02-23 LAB — TSH: TSH: 3.068 u[IU]/mL (ref 0.350–4.500)

## 2017-02-23 MED ORDER — CHLORHEXIDINE GLUCONATE 0.12 % MT SOLN
5.0000 mL | Freq: Two times a day (BID) | OROMUCOSAL | Status: DC
  Administered 2017-02-23 – 2017-03-01 (×9): 5 mL via OROMUCOSAL
  Filled 2017-02-23 (×10): qty 15

## 2017-02-23 MED ORDER — FOLIC ACID 1 MG PO TABS
500.0000 ug | ORAL_TABLET | Freq: Every day | ORAL | Status: DC
  Administered 2017-02-24 – 2017-03-01 (×6): 0.5 mg via ORAL
  Filled 2017-02-23 (×6): qty 1

## 2017-02-23 MED ORDER — EZETIMIBE-SIMVASTATIN 10-40 MG PO TABS
1.0000 | ORAL_TABLET | Freq: Every day | ORAL | Status: DC
  Administered 2017-02-24 – 2017-03-01 (×6): 1 via ORAL
  Filled 2017-02-23 (×6): qty 1

## 2017-02-23 MED ORDER — INSULIN ASPART 100 UNIT/ML ~~LOC~~ SOLN
5.0000 [IU] | Freq: Three times a day (TID) | SUBCUTANEOUS | Status: DC
  Administered 2017-02-24 – 2017-03-01 (×16): 5 [IU] via SUBCUTANEOUS

## 2017-02-23 MED ORDER — INSULIN ASPART 100 UNIT/ML ~~LOC~~ SOLN: 35.0000 [IU] | Freq: Four times a day (QID) | SUBCUTANEOUS | Status: DC

## 2017-02-23 MED ORDER — VITAMIN D (ERGOCALCIFEROL) 1.25 MG (50000 UNIT) PO CAPS
50000.0000 [IU] | ORAL_CAPSULE | ORAL | Status: DC
  Administered 2017-02-25: 50000 [IU] via ORAL
  Filled 2017-02-23: qty 1

## 2017-02-23 MED ORDER — POTASSIUM CHLORIDE CRYS ER 20 MEQ PO TBCR
20.0000 meq | EXTENDED_RELEASE_TABLET | Freq: Every day | ORAL | Status: DC
  Administered 2017-02-24 – 2017-03-01 (×6): 20 meq via ORAL
  Filled 2017-02-23 (×6): qty 1

## 2017-02-23 MED ORDER — MAGNESIUM OXIDE 400 (241.3 MG) MG PO TABS
400.0000 mg | ORAL_TABLET | Freq: Every day | ORAL | Status: DC
  Administered 2017-02-24 – 2017-03-01 (×6): 400 mg via ORAL
  Filled 2017-02-23 (×6): qty 1

## 2017-02-23 MED ORDER — CARVEDILOL PHOSPHATE ER 80 MG PO CP24
80.0000 mg | ORAL_CAPSULE | Freq: Every day | ORAL | Status: DC
  Administered 2017-02-24 – 2017-03-01 (×6): 80 mg via ORAL
  Filled 2017-02-23 (×6): qty 1

## 2017-02-23 MED ORDER — ALLOPURINOL 300 MG PO TABS
300.0000 mg | ORAL_TABLET | Freq: Every day | ORAL | Status: DC
  Administered 2017-02-23 – 2017-02-28 (×6): 300 mg via ORAL
  Filled 2017-02-23 (×6): qty 1

## 2017-02-23 MED ORDER — SODIUM CHLORIDE 0.9 % IV SOLN: 250.0000 mL | INTRAVENOUS | Status: DC | PRN

## 2017-02-23 MED ORDER — INSULIN ASPART 100 UNIT/ML ~~LOC~~ SOLN
0.0000 [IU] | Freq: Three times a day (TID) | SUBCUTANEOUS | Status: DC
  Administered 2017-02-24: 2 [IU] via SUBCUTANEOUS
  Administered 2017-02-24 – 2017-02-25 (×3): 3 [IU] via SUBCUTANEOUS
  Administered 2017-02-25 – 2017-02-26 (×2): 2 [IU] via SUBCUTANEOUS
  Administered 2017-02-27 (×2): 3 [IU] via SUBCUTANEOUS
  Administered 2017-02-28: 2 [IU] via SUBCUTANEOUS
  Administered 2017-02-28: 3 [IU] via SUBCUTANEOUS
  Administered 2017-03-01: 2 [IU] via SUBCUTANEOUS

## 2017-02-23 MED ORDER — SODIUM CHLORIDE 0.9% FLUSH
3.0000 mL | Freq: Two times a day (BID) | INTRAVENOUS | Status: DC
  Administered 2017-02-23 – 2017-03-01 (×9): 3 mL via INTRAVENOUS

## 2017-02-23 MED ORDER — ONDANSETRON HCL 4 MG/2ML IJ SOLN: 4.0000 mg | Freq: Four times a day (QID) | INTRAMUSCULAR | Status: DC | PRN

## 2017-02-23 MED ORDER — HEPARIN SODIUM (PORCINE) 5000 UNIT/ML IJ SOLN
5000.0000 [IU] | Freq: Three times a day (TID) | INTRAMUSCULAR | Status: DC
  Administered 2017-02-23 – 2017-03-01 (×16): 5000 [IU] via SUBCUTANEOUS
  Filled 2017-02-23 (×16): qty 1

## 2017-02-23 MED ORDER — FUROSEMIDE 10 MG/ML IJ SOLN
80.0000 mg | Freq: Two times a day (BID) | INTRAMUSCULAR | Status: DC
  Administered 2017-02-23 – 2017-02-26 (×6): 80 mg via INTRAVENOUS
  Filled 2017-02-23 (×6): qty 8

## 2017-02-23 MED ORDER — ACETAMINOPHEN 325 MG PO TABS: 650.0000 mg | ORAL_TABLET | ORAL | Status: DC | PRN

## 2017-02-23 MED ORDER — INSULIN ASPART 100 UNIT/ML ~~LOC~~ SOLN
0.0000 [IU] | Freq: Every day | SUBCUTANEOUS | Status: DC
Start: 2017-02-23 — End: 2017-03-01
  Administered 2017-02-23: 3 [IU] via SUBCUTANEOUS
  Administered 2017-02-28: 2 [IU] via SUBCUTANEOUS

## 2017-02-23 MED ORDER — INSULIN DETEMIR 100 UNIT/ML ~~LOC~~ SOLN
80.0000 [IU] | Freq: Two times a day (BID) | SUBCUTANEOUS | Status: DC
  Administered 2017-02-23 – 2017-02-27 (×8): 80 [IU] via SUBCUTANEOUS
  Filled 2017-02-23 (×8): qty 0.8

## 2017-02-23 MED ORDER — VITAMIN D 1000 UNITS PO TABS
3000.0000 [IU] | ORAL_TABLET | Freq: Every day | ORAL | Status: DC
  Administered 2017-02-24 – 2017-03-01 (×6): 3000 [IU] via ORAL
  Filled 2017-02-23 (×6): qty 3

## 2017-02-23 MED ORDER — SODIUM CHLORIDE 0.9% FLUSH: 3.0000 mL | INTRAVENOUS | Status: DC | PRN

## 2017-02-23 MED ORDER — ASPIRIN EC 81 MG PO TBEC
81.0000 mg | DELAYED_RELEASE_TABLET | Freq: Every day | ORAL | Status: DC
  Administered 2017-02-24 – 2017-03-01 (×6): 81 mg via ORAL
  Filled 2017-02-23 (×6): qty 1

## 2017-02-23 MED ORDER — METOLAZONE 5 MG PO TABS
5.0000 mg | ORAL_TABLET | ORAL | Status: DC
  Administered 2017-02-24 – 2017-02-26 (×2): 5 mg via ORAL
  Filled 2017-02-23 (×3): qty 1

## 2017-02-23 MED ORDER — CALCIUM CARBONATE ANTACID 500 MG PO CHEW
1.0000 | CHEWABLE_TABLET | Freq: Every day | ORAL | Status: DC
  Administered 2017-02-24 – 2017-03-01 (×6): 200 mg via ORAL
  Filled 2017-02-23 (×6): qty 1

## 2017-02-23 NOTE — H&P (Addendum)
Primary Physician: Primary Cardiologist:  Harrington Challenger  Pt presents with SOB and fatigue   HPI:  Pt is an 83 yo femaile who has a history of chronic diastolic CHF, HTN, HL, OSA (not using CPAP), cKD stage III, RA amd DM   I have followed her in clinic  For a few years She also has a history of dietary noncompliance (salt, carbs)  She lives alone at [place] near by  I saw the pt in clinic earlier this fall  SEptember   SOB  Wt up  Tried to manage her volume overload as an outpt with increased torsemide and Zaroxyolyn every other day    She has been seen in clinic by APPs since  Most recently by B Bhagat on  10/18  She had lost 4 lbs  Did admit to drinking 1 gallon of sweet tea every day  SOB    Advised to cut back on fluid and salt intake.  REcomm O2 use with ambulation.   Since then she has continued to be SOB   Family said last week she was getting hair done  Could not move   Very SOB    Today she comes in  Remains fatigued  Sleeping a lot  Still very SOB with exertion.  She denies CP  No palpitatons  Legs remain swollen       Past Medical History:  Diagnosis Date  . Arthritis    "hips before they were replaced, right knee now" (11/09/2015)  . Chronic diastolic (congestive) heart failure (HCC)    a. echo 06/2015: EF 65-70%, Grade 1 DD, mitral valve calcification.  . CKD (chronic kidney disease), stage III ([place])   . Depression   . H/O cardiac catheterization    a. h/o cardiac catheterization 05/2013 in [place] showing only minimal CAD with increased filling pressure.  . Hyperlipemia   . Hypertensive heart disease   . Morbid obesity ([place])   . Palsy, Bell's   . RA (rheumatoid arthritis) ([place])   . Sleep apnea    "suppose to wear mask; couldn't" (11/09/2015)  . Type II diabetes mellitus ([place])     Medications Prior to Admission  Medication Sig Dispense Refill  . allopurinol (ZYLOPRIM) 300 MG tablet Take 300 mg by mouth at bedtime.     Marland Kitchen aspirin EC 81 MG tablet Take 81 mg by  mouth daily.    . calcium carbonate (TUMS EX) 750 MG chewable tablet Chew 1 tablet by mouth daily.    . carvedilol (COREG CR) 80 MG 24 hr capsule Take 1 capsule (80 mg total) by mouth daily. 90 capsule 3  . chlorhexidine (PERIDEX) 0.12 % solution Use as directed 5 mLs in the mouth or throat 2 (two) times daily.     . cholecalciferol (VITAMIN D) 1000 units tablet Take 3,000 Units by mouth daily.    Marland Kitchen ezetimibe-simvastatin (VYTORIN) 10-40 MG tablet Take 1 tablet by mouth daily. 90 tablet 3  . folic acid (FOLVITE) 962 MCG tablet Take 400 mcg by mouth daily.    . insulin aspart (NOVOLOG) 100 UNIT/ML injection Inject 35 Units into the skin 4 (four) times daily.     . Insulin Detemir (LEVEMIR) 100 UNIT/ML Pen Inject 80 Units into the skin 2 (two) times daily.     . magnesium oxide (MAG-OX) 400 MG tablet Take 1 tablet (400 mg total) by mouth daily. 90 tablet 3  . metolazone (ZAROXOLYN) 5 MG tablet Take 1 tablet (5 mg  total) by mouth every other day. 45 tablet 3  . potassium chloride SA (K-DUR,KLOR-CON) 20 MEQ tablet Take 1 tablet (20 mEq total) by mouth daily. 90 tablet 3  . sulfamethoxazole-trimethoprim (BACTRIM DS,SEPTRA DS) 800-160 MG tablet Take 1 tablet by mouth 2 (two) times daily. For 10 days 20 tablet 0  . telmisartan (MICARDIS) 20 MG tablet Take 1 tablet (20 mg total) by mouth daily. 90 tablet 3  . torsemide (DEMADEX) 20 MG tablet Take 2 tablets (40 mg total) by mouth daily. 180 tablet 3  . Vitamin D, Ergocalciferol, (DRISDOL) 50000 UNITS CAPS capsule Take 50,000 Units by mouth every Wednesday.          Infusions:   No Known Allergies  Social History   Socioeconomic History  . Marital status: Widowed    Spouse name: Not on file  . Number of children: Not on file  . Years of education: Not on file  . Highest education level: Not on file  Social Needs  . Financial resource strain: Not on file  . Food insecurity - worry: Not on file  . Food insecurity - inability: Not on file  .  Transportation needs - medical: Not on file  . Transportation needs - non-medical: Not on file  Occupational History  . Not on file  Tobacco Use  . Smoking status: Never Smoker  . Smokeless tobacco: Never Used  Substance and Sexual Activity  . Alcohol use: Yes    Alcohol/week: 0.0 oz    Comment: 11/09/2015 "might have glass of champaign on special occasion"  . Drug use: No  . Sexual activity: No  Other Topics Concern  . Not on file  Social History Narrative  . Not on file    Family History  Problem Relation Age of Onset  . Sudden death Mother   . Stroke Mother   . Hypertension Neg Hx     REVIEW OF SYSTEMS:  All systems reviewed  Negative to the above problem except as noted above.    PHYSICAL EXAM: There were no vitals filed for this visit.  No intake or output data in the 24 hours ending 02/23/17 1702  General:  Obese 83 yo  In NAD at rest   HEENT: normal Neck: supple. No definite Carotids 2+ bilat; no bruits. No lymphadenopathy or thryomegaly appreciated. Cor: PMI nondisplaced. Regular rate & rhythm. No rubs, gallops or murmurs. Lungs: Mild wheeze bilaterally   Abdomen;  Obese  Nondender  No definite masses  Good bowel sounds. Extremities: no cyanosis, clubbing, rash,  1+ edema above knee  Neuro: alert & oriented x 3, cranial nerves grossly intact. moves all 4 extremities w/o difficulty. Affect pleasant.  ECG:  Pending   ASSESSMENT:  Pt is a 83 yo with history of chronic diastolic CHF, DM and noncompliance with diet   She presents today to clinic with continued volume overload.  Has made no signif improvement with  outpt attempts at diuresis  I think her diet has a lot to do with things I discussed with pt and daughter  I would recomm admit for IV diuresis   Follow response   She did well with short admssion a couple years ago for IV lasix  If does not respond  consider R Heart cath prior to d/c to confirm  May even consider anyways as  exam is difficult given  size  2.  HTN  Follow with diuresis  Hold ARB  3  DM  A1C was over 10 a  few wks ago  Watch glu with meals   4  CKD  Follow Cr with diuresis  Hold ARB for now   5  OSA  Refuses CPAP  6  HL  Continue statin

## 2017-02-23 NOTE — Patient Instructions (Addendum)
Dr. Harrington Challenger recommends that you be admitted to [facility].   The hospital will contact your daughter at [phone] to let you know when to come to the hospital.  You will be directed to "admitting".

## 2017-02-23 NOTE — Progress Notes (Signed)
   Cardiology Office Note    Date:  02/23/2017   ID:  Brandi, Melton 01/23/1931, MRN [id number]  PCP:  Haywood Pao, MD  Cardiologist:  Dr. Harrington Challenger  Chief Complaint: SOB  follow up    Pt admitted to [facility].  Dorris Carnes

## 2017-02-24 ENCOUNTER — Observation Stay (HOSPITAL_COMMUNITY): Payer: Medicare Other

## 2017-02-24 DIAGNOSIS — M069 Rheumatoid arthritis, unspecified: Secondary | ICD-10-CM | POA: Diagnosis present

## 2017-02-24 DIAGNOSIS — I130 Hypertensive heart and chronic kidney disease with heart failure and stage 1 through stage 4 chronic kidney disease, or unspecified chronic kidney disease: Secondary | ICD-10-CM | POA: Diagnosis present

## 2017-02-24 DIAGNOSIS — E1122 Type 2 diabetes mellitus with diabetic chronic kidney disease: Secondary | ICD-10-CM | POA: Diagnosis present

## 2017-02-24 DIAGNOSIS — Z7982 Long term (current) use of aspirin: Secondary | ICD-10-CM | POA: Diagnosis not present

## 2017-02-24 DIAGNOSIS — N183 Chronic kidney disease, stage 3 (moderate): Secondary | ICD-10-CM | POA: Diagnosis not present

## 2017-02-24 DIAGNOSIS — I110 Hypertensive heart disease with heart failure: Secondary | ICD-10-CM | POA: Diagnosis not present

## 2017-02-24 DIAGNOSIS — Z794 Long term (current) use of insulin: Secondary | ICD-10-CM | POA: Diagnosis not present

## 2017-02-24 DIAGNOSIS — E876 Hypokalemia: Secondary | ICD-10-CM | POA: Diagnosis present

## 2017-02-24 DIAGNOSIS — I2510 Atherosclerotic heart disease of native coronary artery without angina pectoris: Secondary | ICD-10-CM | POA: Diagnosis present

## 2017-02-24 DIAGNOSIS — G510 Bell's palsy: Secondary | ICD-10-CM | POA: Diagnosis present

## 2017-02-24 DIAGNOSIS — G4733 Obstructive sleep apnea (adult) (pediatric): Secondary | ICD-10-CM | POA: Diagnosis present

## 2017-02-24 DIAGNOSIS — F329 Major depressive disorder, single episode, unspecified: Secondary | ICD-10-CM | POA: Diagnosis present

## 2017-02-24 DIAGNOSIS — Z9111 Patient's noncompliance with dietary regimen: Secondary | ICD-10-CM | POA: Diagnosis not present

## 2017-02-24 DIAGNOSIS — Z79899 Other long term (current) drug therapy: Secondary | ICD-10-CM | POA: Diagnosis not present

## 2017-02-24 DIAGNOSIS — I5031 Acute diastolic (congestive) heart failure: Secondary | ICD-10-CM | POA: Diagnosis not present

## 2017-02-24 DIAGNOSIS — R0602 Shortness of breath: Secondary | ICD-10-CM | POA: Diagnosis not present

## 2017-02-24 DIAGNOSIS — E785 Hyperlipidemia, unspecified: Secondary | ICD-10-CM | POA: Diagnosis present

## 2017-02-24 DIAGNOSIS — R062 Wheezing: Secondary | ICD-10-CM | POA: Diagnosis not present

## 2017-02-24 DIAGNOSIS — I5033 Acute on chronic diastolic (congestive) heart failure: Secondary | ICD-10-CM

## 2017-02-24 DIAGNOSIS — Z6841 Body Mass Index (BMI) 40.0 and over, adult: Secondary | ICD-10-CM | POA: Diagnosis not present

## 2017-02-24 LAB — GLUCOSE, CAPILLARY
GLUCOSE-CAPILLARY: 160 mg/dL — AB (ref 65–99)
GLUCOSE-CAPILLARY: 188 mg/dL — AB (ref 65–99)
Glucose-Capillary: 141 mg/dL — ABNORMAL HIGH (ref 65–99)
Glucose-Capillary: 161 mg/dL — ABNORMAL HIGH (ref 65–99)

## 2017-02-24 LAB — BASIC METABOLIC PANEL
Anion gap: 11 (ref 5–15)
BUN: 56 mg/dL — AB (ref 6–20)
CHLORIDE: 99 mmol/L — AB (ref 101–111)
CO2: 30 mmol/L (ref 22–32)
CREATININE: 1.8 mg/dL — AB (ref 0.44–1.00)
Calcium: 8.8 mg/dL — ABNORMAL LOW (ref 8.9–10.3)
GFR calc Af Amer: 29 mL/min — ABNORMAL LOW (ref >60)
GFR, EST NON AFRICAN AMERICAN: 25 mL/min — AB (ref >60)
GLUCOSE: 183 mg/dL — AB (ref 65–99)
Potassium: 4.1 mmol/L (ref 3.5–5.1)
SODIUM: 140 mmol/L (ref 135–145)

## 2017-02-24 NOTE — Plan of Care (Signed)
Pt ambulates to bedside commode with assistance

## 2017-02-24 NOTE — Progress Notes (Signed)
Progress Note  Patient Name: Brandi Melton Date of Encounter: 02/24/2017  Primary Cardiologist: Dr. Harrington Challenger  Subjective   Pt states her breathing has not improved. She is on room air.    Inpatient Medications    Scheduled Meds: . allopurinol  300 mg Oral QHS  . aspirin EC  81 mg Oral Daily  . calcium carbonate  1 tablet Oral Daily  . carvedilol  80 mg Oral Daily  . chlorhexidine  5 mL Mouth/Throat BID  . cholecalciferol  3,000 Units Oral Daily  . ezetimibe-simvastatin  1 tablet Oral Daily  . folic acid  160 mcg Oral Daily  . furosemide  80 mg Intravenous BID  . heparin  5,000 Units Subcutaneous Q8H  . insulin aspart  0-15 Units Subcutaneous TID WC  . insulin aspart  0-5 Units Subcutaneous QHS  . insulin aspart  5 Units Subcutaneous TID WC  . insulin detemir  80 Units Subcutaneous BID  . magnesium oxide  400 mg Oral Daily  . metolazone  5 mg Oral QODAY  . potassium chloride SA  20 mEq Oral Daily  . sodium chloride flush  3 mL Intravenous Q12H  . [START ON 02/25/2017] Vitamin D (Ergocalciferol)  50,000 Units Oral Q Wed   Continuous Infusions: . sodium chloride     PRN Meds: sodium chloride, acetaminophen, ondansetron (ZOFRAN) IV, sodium chloride flush   Vital Signs    Vitals:   02/23/17 1744 02/23/17 2108 02/24/17 0500 02/24/17 0600  BP: (!) 122/43 (!) 132/50 120/60 120/60  Pulse: 77 83 86   Resp: 18 20 18 18   Temp: 98.4 F (36.9 C) 98.1 F (36.7 C) 99 F (37.2 C)   TempSrc: Oral Oral Oral   SpO2: 93% 97% 92%   Weight:   241 lb 9.6 oz (109.6 kg)     Intake/Output Summary (Last 24 hours) at 02/24/2017 0933 Last data filed at 02/24/2017 7371 Gross per 24 hour  Intake 960 ml  Output 1600 ml  Net -640 ml   Filed Weights   02/24/17 0500  Weight: 241 lb 9.6 oz (109.6 kg)     Physical Exam   General: Well developed, well nourished, female appearing in no acute distress. Head: Normocephalic, atraumatic.  Neck: Supple without bruits, no JVD, exam  difficult Lungs:  Resp regular and unlabored, occasional crackle in bases Heart: RRR, S1, S2, no S3, S4, or murmur; no rub. Abdomen: Soft, non-tender, non-distended with normoactive bowel sounds. No hepatomegaly. No rebound/guarding. No obvious abdominal masses. Extremities: No clubbing, cyanosis, trace to 1+ edema. Distal pedal pulses are faint bilaterally. Neuro: Alert and oriented X 3. Moves all extremities spontaneously. Psych: Normal affect.  Labs    Chemistry Recent Labs  Lab 02/23/17 1802 02/24/17 0548  NA 140 140  K 4.0 4.1  CL 100* 99*  CO2 31 30  GLUCOSE 182* 183*  BUN 56* 56*  CREATININE 1.87* 1.80*  CALCIUM 9.1 8.8*  PROT 6.4*  --   ALBUMIN 2.9*  --   AST 34  --   ALT 28  --   ALKPHOS 83  --   BILITOT 0.8  --   GFRNONAA 24* 25*  GFRAA 27* 29*  ANIONGAP 9 11     Hematology Recent Labs  Lab 02/23/17 1802  WBC 9.4  RBC 3.67*  HGB 11.0*  HCT 36.4  MCV 99.2  MCH 30.0  MCHC 30.2  RDW 16.0*  PLT 226    Cardiac EnzymesNo results for input(s): TROPONINI in  the last 168 hours. No results for input(s): TROPIPOC in the last 168 hours.   BNP Recent Labs  Lab 02/23/17 1802  BNP 160.2*     DDimer No results for input(s): DDIMER in the last 168 hours.   Radiology    No results found.   Telemetry    Sinus  - Personally Reviewed  ECG    No new tracings - Personally Reviewed   Cardiac Studies   Echo 07/02/16: Study Conclusions - Left ventricle: The cavity size was normal. Systolic function was   vigorous. The estimated ejection fraction was in the range of 65%   to 70%. Wall motion was normal; there were no regional wall   motion abnormalities. Doppler parameters are consistent with   abnormal left ventricular relaxation (grade 1 diastolic   dysfunction). Doppler parameters are consistent with elevated   ventricular end-diastolic filling pressure. - Aortic valve: Trileaflet; normal thickness leaflets.   Transvalvular velocity was within  the normal range. There was no   stenosis. There was no regurgitation. - Aortic root: The aortic root was normal in size. - Mitral valve: Severe mitral annular calcifications, predominantly   posterior. Calcified annulus. Mildly thickened leaflets . There   was no regurgitation. - Right ventricle: The cavity size was normal. Wall thickness was   normal. Systolic function was normal. - Tricuspid valve: There was no regurgitation. - Pulmonary arteries: Systolic pressure was within the normal   range.  Patient Profile     83 y.o. female with history of chronic diastolic CHF, DM and noncompliance with diet   She presented to clinic on 02/23/17 with volume overload that was resistant to efforts to diurese as an outpatient. She was admitted to [facility] for further management for her SOB.  Assessment & Plan    1. Acute on chronic diastolic heart failure - likely related to dietary indiscretions and fluid intake at home - diuresing on 80 mg IV lasix BID with zaroxolyn - she is overall net negative 1.1L with 1.6 L urine output yesterday - continue daily weights, she is 241 lbs today - K stable at 4.1 with current replacement  2. HTN - continue coreg - pressures have been controlled   3. DM - SSI  4. CKD -sCr 1.80  5. OSA, refuses CPAP  6. HLD - continue statin   Signed, Ledora Bottcher , PA-C 9:33 AM 02/24/2017 Pager: [phone]  History and all data above reviewed.  Patient examined.  I agree with the findings as above.  She is not breathing any better this AM.  No distress however.  She is eating a roast beef sub when I entered the room.    The patient exam reveals COR:RRR  ,  Lungs: Expiratory wheezing  ,  Abd: Positive bowel sounds, no rebound no guarding, Ext Mild edema  .  All available labs, radiology testing, previous records reviewed. Agree with documented assessment and plan. Acute on chronic diastolic HF:  Today she will be getting Zaroxolyn.  I will continue the  Lasix as ordered.  I changed her to a 2 gm Na diet and talked with the staff about strict I/Os.  I will order a CXR with her wheezing.    Jeneen Rinks Aveion Nguyen  11:02 AM  02/24/2017

## 2017-02-25 LAB — BASIC METABOLIC PANEL
Anion gap: 11 (ref 5–15)
BUN: 52 mg/dL — AB (ref 6–20)
CO2: 33 mmol/L — ABNORMAL HIGH (ref 22–32)
CREATININE: 1.85 mg/dL — AB (ref 0.44–1.00)
Calcium: 9.1 mg/dL (ref 8.9–10.3)
Chloride: 96 mmol/L — ABNORMAL LOW (ref 101–111)
GFR calc Af Amer: 28 mL/min — ABNORMAL LOW (ref >60)
GFR, EST NON AFRICAN AMERICAN: 24 mL/min — AB (ref >60)
GLUCOSE: 116 mg/dL — AB (ref 65–99)
POTASSIUM: 3.3 mmol/L — AB (ref 3.5–5.1)
Sodium: 140 mmol/L (ref 135–145)

## 2017-02-25 LAB — GLUCOSE, CAPILLARY
GLUCOSE-CAPILLARY: 151 mg/dL — AB (ref 65–99)
Glucose-Capillary: 88 mg/dL (ref 65–99)
Glucose-Capillary: 124 mg/dL — ABNORMAL HIGH (ref 65–99)
Glucose-Capillary: 151 mg/dL — ABNORMAL HIGH (ref 65–99)

## 2017-02-25 MED ORDER — POTASSIUM CHLORIDE CRYS ER 20 MEQ PO TBCR
40.0000 meq | EXTENDED_RELEASE_TABLET | Freq: Two times a day (BID) | ORAL | Status: AC
  Administered 2017-02-25 (×2): 40 meq via ORAL
  Filled 2017-02-25 (×2): qty 2

## 2017-02-25 MED ORDER — POTASSIUM CHLORIDE CRYS ER 20 MEQ PO TBCR: 40.0000 meq | EXTENDED_RELEASE_TABLET | Freq: Once | ORAL | Status: DC

## 2017-02-25 MED ORDER — IPRATROPIUM-ALBUTEROL 0.5-2.5 (3) MG/3ML IN SOLN
3.0000 mL | Freq: Four times a day (QID) | RESPIRATORY_TRACT | Status: DC | PRN
  Administered 2017-02-26: 3 mL via RESPIRATORY_TRACT
  Filled 2017-02-25: qty 3

## 2017-02-25 MED ORDER — GUAIFENESIN ER 600 MG PO TB12
1200.0000 mg | ORAL_TABLET | Freq: Two times a day (BID) | ORAL | Status: DC
  Administered 2017-02-25 – 2017-03-01 (×8): 1200 mg via ORAL
  Filled 2017-02-25 (×8): qty 2

## 2017-02-25 MED ORDER — METOLAZONE 5 MG PO TABS
5.0000 mg | ORAL_TABLET | Freq: Once | ORAL | Status: AC
Start: 2017-02-25 — End: 2017-02-25
  Administered 2017-02-25: 5 mg via ORAL

## 2017-02-25 NOTE — Evaluation (Signed)
Occupational Therapy Evaluation Patient Details Name: Brandi Melton MRN: [id number] DOB: Apr 25, 1930 Today's Date: 02/25/2017    History of Present Illness Pt admitted with CHF exacerbation. PMH: DM, HTN, OSA, CKD, RA.   Clinical Impression   Pt was assisted by and aide for LB bathing and dressing and showering. She receives assist for one meal a day at [facility] and for housekeeping. Pt ambulates with a rollator, but requires many seated rest breaks. Pt likely very close to her baseline in mobility and ADL, not interested in AD for LB ADL. Educated in energy conservation. No further OT needs, encouraged pt to maximize participation in ADL with nursing staff.    Follow Up Recommendations  No OT follow up    Equipment Recommendations  None recommended by OT    Recommendations for Other Services       Precautions / Restrictions Precautions Precautions: Fall Restrictions Weight Bearing Restrictions: No      Mobility Bed Mobility               General bed mobility comments: pt in chair  Transfers Overall transfer level: Needs assistance Equipment used: 4-wheeled walker Transfers: Sit to/from Stand Sit to Stand: Supervision         General transfer comment: cues for hand placement    Balance Overall balance assessment: Needs assistance   Sitting balance-Leahy Scale: Good       Standing balance-Leahy Scale: Poor Standing balance comment: reliant on walker for ambulation                           ADL either performed or assessed with clinical judgement   ADL Overall ADL's : At baseline                                             Vision Baseline Vision/History: Wears glasses Wears Glasses: Distance only Patient Visual Report: No change from baseline       Perception     Praxis      Pertinent Vitals/Pain Pain Assessment: No/denies pain     Hand Dominance Right   Extremity/Trunk Assessment Upper Extremity  Assessment Upper Extremity Assessment: Overall WFL for tasks assessed   Lower Extremity Assessment Lower Extremity Assessment: Defer to PT evaluation       Communication Communication Communication: No difficulties   Cognition Arousal/Alertness: Awake/alert Behavior During Therapy: WFL for tasks assessed/performed Overall Cognitive Status: Within Functional Limits for tasks assessed                                     General Comments       Exercises     Shoulder Instructions      Home Living Family/patient expects to be discharged to:: Private residence Living Arrangements: Alone Available Help at Discharge: Personal care attendant;Available PRN/intermittently;Family Type of Home: Apartment Home Access: Elevator;Level entry     Home Layout: One level     Bathroom Shower/Tub: Other (comment)(walk in tub)   Bathroom Toilet: Standard     Home Equipment: Walker - 4 wheels;Shower seat;Bedside commode;Grab bars - tub/shower          Prior Functioning/Environment Level of Independence: Needs assistance  Gait / Transfers Assistance Needed: walks with rollator, takes frequent seated rest breaks  ADL's / Homemaking Assistance Needed: has an aide 5 days a week to help with showering and compression, prepares light meals, goes to dining room for one meal, assist for housekeeping            OT Problem List: Cardiopulmonary status limiting activity      OT Treatment/Interventions:      OT Goals(Current goals can be found in the care plan section) Acute Rehab OT Goals Patient Stated Goal: no to be a burden to her children  OT Frequency:     Barriers to D/C:            Co-evaluation              AM-PAC PT "6 Clicks" Daily Activity     Outcome Measure Help from another person eating meals?: None Help from another person taking care of personal grooming?: A Little Help from another person toileting, which includes using toliet, bedpan, or  urinal?: A Little Help from another person bathing (including washing, rinsing, drying)?: A Little Help from another person to put on and taking off regular upper body clothing?: None Help from another person to put on and taking off regular lower body clothing?: A Lot 6 Click Score: 19   End of Session Equipment Utilized During Treatment: Gait belt;Rolling walker Nurse Communication: Other (comment)(wants "booster fluid pill")  Activity Tolerance: Patient tolerated treatment well Patient left: in chair;with call bell/phone within reach  OT Visit Diagnosis: Unsteadiness on feet (R26.81)                Time: 9675-9163 OT Time Calculation (min): 34 min Charges:  OT General Charges $OT Visit: 1 Visit OT Evaluation $OT Eval Moderate Complexity: 1 Mod OT Treatments $Self Care/Home Management : 8-22 mins G-Codes:     Brandi Melton 02/25/2017, 3:16 PM  02/25/2017 Brandi Melton, OTR/L Pager: [phone]

## 2017-02-25 NOTE — Evaluation (Signed)
Physical Therapy Evaluation Patient Details Name: Brandi Melton MRN: [id number] DOB: 12/22/1930 Today's Date: 02/25/2017   History of Present Illness  Pt admitted with CHF exacerbation. PMH: DM, HTN, OSA, CKD, RA.  Clinical Impression  Pt is at or close to baseline functioning and should be safe at home with PCA assist. There are no further acute PT needs.  Will sign off at this time.     Follow Up Recommendations No PT follow up    Equipment Recommendations  None recommended by PT    Recommendations for Other Services       Precautions / Restrictions Precautions Precautions: Fall Restrictions Weight Bearing Restrictions: No      Mobility  Bed Mobility Overal bed mobility: Needs Assistance Bed Mobility: Sidelying to Sit   Sidelying to sit: Min guard       General bed mobility comments: pt in chair  Transfers Overall transfer level: Needs assistance Equipment used: 4-wheeled walker Transfers: Sit to/from Stand Sit to Stand: Supervision         General transfer comment: cues for hand placement  Ambulation/Gait Ambulation/Gait assistance: Supervision Ambulation Distance (Feet): 20 Feet   Gait Pattern/deviations: Step-through pattern   Gait velocity interpretation: Below normal speed for age/gender General Gait Details: steady with easy maneuverability using rollator, but missed using the brakes x2 and cued to use them more often.  Stairs            Wheelchair Mobility    Modified Rankin (Stroke Patients Only)       Balance Overall balance assessment: Needs assistance   Sitting balance-Leahy Scale: Good       Standing balance-Leahy Scale: Poor Standing balance comment: reliant on walker for ambulation                             Pertinent Vitals/Pain Pain Assessment: No/denies pain    Home Living Family/patient expects to be discharged to:: Private residence Living Arrangements: Alone Available Help at Discharge:  Personal care attendant;Available PRN/intermittently;Family Type of Home: Apartment Home Access: Elevator;Level entry     Home Layout: One level Home Equipment: Bootjack - 4 wheels;Shower seat;Bedside commode;Grab bars - tub/shower      Prior Function Level of Independence: Needs assistance   Gait / Transfers Assistance Needed: walks with rollator, takes frequent seated rest breaks  ADL's / Homemaking Assistance Needed: has an aide 5 days a week to help with showering and compression, prepares light meals, goes to dining room for one meal, assist for housekeeping        Hand Dominance   Dominant Hand: Right    Extremity/Trunk Assessment   Upper Extremity Assessment Upper Extremity Assessment: Overall WFL for tasks assessed    Lower Extremity Assessment Lower Extremity Assessment: Overall WFL for tasks assessed(hip flexor and core weakness, but functional)       Communication   Communication: No difficulties  Cognition Arousal/Alertness: Awake/alert Behavior During Therapy: WFL for tasks assessed/performed Overall Cognitive Status: Within Functional Limits for tasks assessed                                        General Comments General comments (skin integrity, edema, etc.): ambulating on RA at lower 90%    Exercises     Assessment/Plan    PT Assessment Patent does not need any further PT services  PT  Problem List         PT Treatment Interventions      PT Goals (Current goals can be found in the Care Plan section)  Acute Rehab PT Goals Patient Stated Goal: no to be a burden to her children PT Goal Formulation: All assessment and education complete, DC therapy    Frequency     Barriers to discharge        Co-evaluation               AM-PAC PT "6 Clicks" Daily Activity  Outcome Measure Difficulty turning over in bed (including adjusting bedclothes, sheets and blankets)?: A Little Difficulty moving from lying on back to  sitting on the side of the bed? : A Little Difficulty sitting down on and standing up from a chair with arms (e.g., wheelchair, bedside commode, etc,.)?: None Help needed moving to and from a bed to chair (including a wheelchair)?: A Little Help needed walking in hospital room?: A Little Help needed climbing 3-5 steps with a railing? : A Lot 6 Click Score: 18    End of Session   Activity Tolerance: Patient tolerated treatment well(deferred out in the hall due to just back from walking) Patient left: in chair;with call bell/phone within reach Nurse Communication: Mobility status PT Visit Diagnosis: Difficulty in walking, not elsewhere classified (R26.2)    Time: 1715-1735 PT Time Calculation (min) (ACUTE ONLY): 20 min   Charges:   PT Evaluation $PT Eval Low Complexity: 1 Low     PT G Codes:        03-21-17  Donnella Sham, PT [phone]  (pager)  Tessie Fass Lashannon Bresnan 03/21/2017, 5:46 PM

## 2017-02-25 NOTE — Progress Notes (Signed)
Progress Note  Patient Name: Brandi Melton Date of Encounter: 02/25/2017  Primary Cardiologist: Dr. Harrington Challenger  Subjective   Pt states she still has labored breathing.  Inpatient Medications    Scheduled Meds: . allopurinol  300 mg Oral QHS  . aspirin EC  81 mg Oral Daily  . calcium carbonate  1 tablet Oral Daily  . carvedilol  80 mg Oral Daily  . chlorhexidine  5 mL Mouth/Throat BID  . cholecalciferol  3,000 Units Oral Daily  . ezetimibe-simvastatin  1 tablet Oral Daily  . folic acid  295 mcg Oral Daily  . furosemide  80 mg Intravenous BID  . heparin  5,000 Units Subcutaneous Q8H  . insulin aspart  0-15 Units Subcutaneous TID WC  . insulin aspart  0-5 Units Subcutaneous QHS  . insulin aspart  5 Units Subcutaneous TID WC  . insulin detemir  80 Units Subcutaneous BID  . magnesium oxide  400 mg Oral Daily  . metolazone  5 mg Oral QODAY  . potassium chloride SA  20 mEq Oral Daily  . sodium chloride flush  3 mL Intravenous Q12H  . Vitamin D (Ergocalciferol)  50,000 Units Oral Q Wed   Continuous Infusions: . sodium chloride     PRN Meds: sodium chloride, acetaminophen, ondansetron (ZOFRAN) IV, sodium chloride flush   Vital Signs    Vitals:   02/24/17 1500 02/24/17 2019 02/24/17 2100 02/25/17 0500  BP: (!) 127/52 (!) 131/59  (!) 129/52  Pulse: 78 75    Resp: 20 20  (!) 28  Temp: 98.4 F (36.9 C) 97.6 F (36.4 C)  (!) 97.5 F (36.4 C)  TempSrc: Oral Oral  Oral  SpO2: 92% 90%  92%  Weight:    236 lb 6.4 oz (107.2 kg)  Height:   5\' 3"  (1.6 m)     Intake/Output Summary (Last 24 hours) at 02/25/2017 1024 Last data filed at 02/25/2017 0500 Gross per 24 hour  Intake 480 ml  Output 2000 ml  Net -1520 ml   Filed Weights   02/24/17 0500 02/25/17 0500  Weight: 241 lb 9.6 oz (109.6 kg) 236 lb 6.4 oz (107.2 kg)     Physical Exam   General: Well developed, well nourished, female appearing in no acute distress. Head: Normocephalic, atraumatic.  Neck: Supple without  bruits, no JVD Lungs:  Resp regular and unlabored, wheezes and crackles throughout Heart: RRR, S1, S2, no murmur; no rub. Abdomen: Soft, non-tender, non-distended with normoactive bowel sounds. No hepatomegaly. No rebound/guarding. No obvious abdominal masses. Extremities: No clubbing, cyanosis, trace edema. Distal pedal pulses are 1+ bilaterally. Neuro: Alert and oriented X 3. Moves all extremities spontaneously. Psych: Normal affect.  Labs    Chemistry Recent Labs  Lab 02/23/17 1802 02/24/17 0548 02/25/17 0411  NA 140 140 140  K 4.0 4.1 3.3*  CL 100* 99* 96*  CO2 31 30 33*  GLUCOSE 182* 183* 116*  BUN 56* 56* 52*  CREATININE 1.87* 1.80* 1.85*  CALCIUM 9.1 8.8* 9.1  PROT 6.4*  --   --   ALBUMIN 2.9*  --   --   AST 34  --   --   ALT 28  --   --   ALKPHOS 83  --   --   BILITOT 0.8  --   --   GFRNONAA 24* 25* 24*  GFRAA 27* 29* 28*  ANIONGAP 9 11 11      Hematology Recent Labs  Lab 02/23/17 1802  WBC 9.4  RBC 3.67*  HGB 11.0*  HCT 36.4  MCV 99.2  MCH 30.0  MCHC 30.2  RDW 16.0*  PLT 226    Cardiac EnzymesNo results for input(s): TROPONINI in the last 168 hours. No results for input(s): TROPIPOC in the last 168 hours.   BNP Recent Labs  Lab 02/23/17 1802  BNP 160.2*     DDimer No results for input(s): DDIMER in the last 168 hours.   Radiology    Dg Chest 2 View  Result Date: 02/24/2017 CLINICAL DATA:  Shortness of breath, chronic CHF, chronic renal insufficiency, morbid obesity, nonsmoker. EXAM: CHEST  2 VIEW COMPARISON:  PA and lateral chest x-ray of February 19, 2016 FINDINGS: The lungs are adequately inflated. The interstitial markings are mildly increased. The cardiac silhouette is enlarged. The pulmonary vascularity is not clearly engorged. There is calcification in the wall of the aortic arch as well as within the mitral valvular annulus. The bony thorax exhibits no acute abnormality. There is prominent thoracic kyphosis with multilevel degenerative  disc disease. IMPRESSION: No acute pneumonia. Probable underlying chronic bronchitic changes. CHF with mild central pulmonary vascular prominence but no definite pulmonary edema. Thoracic aortic atherosclerosis. Electronically Signed   By: David  Martinique M.D.   On: 02/24/2017 13:59     Telemetry    Sinus  - Personally Reviewed  ECG    No new tracings - Personally Reviewed   Cardiac Studies   Echo 07/02/16: Study Conclusions - Left ventricle: The cavity size was normal. Systolic function was vigorous. The estimated ejection fraction was in the range of 65% to 70%. Wall motion was normal; there were no regional wall motion abnormalities. Doppler parameters are consistent with abnormal left ventricular relaxation (grade 1 diastolic dysfunction). Doppler parameters are consistent with elevated ventricular end-diastolic filling pressure. - Aortic valve: Trileaflet; normal thickness leaflets. Transvalvular velocity was within the normal range. There was no stenosis. There was no regurgitation. - Aortic root: The aortic root was normal in size. - Mitral valve: Severe mitral annular calcifications, predominantly posterior. Calcified annulus. Mildly thickened leaflets . There was no regurgitation. - Right ventricle: The cavity size was normal. Wall thickness was normal. Systolic function was normal. - Tricuspid valve: There was no regurgitation. - Pulmonary arteries: Systolic pressure was within the normal range.   Patient Profile     83 y.o. female with history of chronic diastolic CHF, DM and noncompliance with diet She presented to clinic on 02/23/17 with volume overload that was resistant to efforts to diurese as an outpatient. She was admitted to [facility] for further management for her SOB  Assessment & Plan    1. Acute on chronic diastolic heart failure - diuresed on lasix and zaroxolyn yesterday - she is overall net negative 2.6 L with 2.5 L urine output  yesterday - she is not scheduled to receive zaroxolyn today (scheduled for every other day), but would like to receive it today - creatinine elevated but stable - ordered OTO zaroxolyn for today; plan for third dose tomorrow and then re-evaluate   2. Wheezing - she continues to wheeze on exam and states that her breathing is not better - CXR with bronchitic changes - ordered PRN duo-nebs and mucinex   3. Hypokalemia  - K 3.3 - ordered extra PO K   4. HTN - continue coreg - pressures have been controlled   5. CKD - sCr is stable at 1.85 (1.80) - baseline is 1.06-1.20 (12/2016)   6. OSA - refuses CPAP  7. HLD - continue statin   Signed, Ledora Bottcher , PA-C 10:24 AM 02/25/2017 Pager: [phone]  History and all data above reviewed.  Patient examined.  I agree with the findings as above.  She is breathing better and thrilled with her UO.  No pain.  The patient exam reveals COR:RRR  ,  Lungs: Clear  ,  Abd: Positive bowel sounds, no rebound no guarding, Ext Mild edema  .  All available labs, radiology testing, previous records reviewed. Agree with documented assessment and plan. Acute on chronic diastolic HF:  Difficult by exam to assess fluid but her symptoms are better yet not baseline.  I suspect then that she still has volume excess and will continue diuresis as above.  I discussed the plan with her primary cardiologist.    Minus Breeding  11:06 AM  02/25/2017

## 2017-02-26 DIAGNOSIS — I5031 Acute diastolic (congestive) heart failure: Secondary | ICD-10-CM

## 2017-02-26 LAB — BASIC METABOLIC PANEL
Anion gap: 9 (ref 5–15)
BUN: 52 mg/dL — AB (ref 6–20)
CHLORIDE: 96 mmol/L — AB (ref 101–111)
CO2: 35 mmol/L — ABNORMAL HIGH (ref 22–32)
CREATININE: 2.17 mg/dL — AB (ref 0.44–1.00)
Calcium: 8.9 mg/dL (ref 8.9–10.3)
GFR calc Af Amer: 23 mL/min — ABNORMAL LOW (ref >60)
GFR calc non Af Amer: 20 mL/min — ABNORMAL LOW (ref >60)
GLUCOSE: 87 mg/dL (ref 65–99)
Potassium: 4.3 mmol/L (ref 3.5–5.1)
SODIUM: 140 mmol/L (ref 135–145)

## 2017-02-26 LAB — GLUCOSE, CAPILLARY
GLUCOSE-CAPILLARY: 62 mg/dL — AB (ref 65–99)
GLUCOSE-CAPILLARY: 147 mg/dL — AB (ref 65–99)
GLUCOSE-CAPILLARY: 162 mg/dL — AB (ref 65–99)
Glucose-Capillary: 96 mg/dL (ref 65–99)
Glucose-Capillary: 115 mg/dL — ABNORMAL HIGH (ref 65–99)

## 2017-02-26 NOTE — Progress Notes (Signed)
Progress Note  Patient Name: Brandi Melton Date of Encounter: 02/26/2017  Primary Cardiologist:    Dr. Harrington Challenger  Subjective   No chest pain.  Breathing is OK.  Probably not yet at baseline.    Inpatient Medications    Scheduled Meds: . allopurinol  300 mg Oral QHS  . aspirin EC  81 mg Oral Daily  . calcium carbonate  1 tablet Oral Daily  . carvedilol  80 mg Oral Daily  . chlorhexidine  5 mL Mouth/Throat BID  . cholecalciferol  3,000 Units Oral Daily  . ezetimibe-simvastatin  1 tablet Oral Daily  . folic acid  409 mcg Oral Daily  . furosemide  80 mg Intravenous BID  . guaiFENesin  1,200 mg Oral BID  . heparin  5,000 Units Subcutaneous Q8H  . insulin aspart  0-15 Units Subcutaneous TID WC  . insulin aspart  0-5 Units Subcutaneous QHS  . insulin aspart  5 Units Subcutaneous TID WC  . insulin detemir  80 Units Subcutaneous BID  . magnesium oxide  400 mg Oral Daily  . metolazone  5 mg Oral QODAY  . potassium chloride SA  20 mEq Oral Daily  . sodium chloride flush  3 mL Intravenous Q12H  . Vitamin D (Ergocalciferol)  50,000 Units Oral Q Wed   Continuous Infusions: . sodium chloride     PRN Meds: sodium chloride, acetaminophen, ipratropium-albuterol, ondansetron (ZOFRAN) IV, sodium chloride flush   Vital Signs    Vitals:   02/26/17 0527 02/26/17 0528 02/26/17 0700 02/26/17 0955  BP: (!) 115/38 (!) 110/46  (!) 109/53  Pulse: 72   69  Resp: 16   19  Temp: 98.4 F (36.9 C)     TempSrc: Oral     SpO2: 91%   92%  Weight:   235 lb 11.2 oz (106.9 kg)   Height:        Intake/Output Summary (Last 24 hours) at 02/26/2017 1016 Last data filed at 02/26/2017 0800 Gross per 24 hour  Intake 1180 ml  Output 1250 ml  Net -70 ml   Filed Weights   02/24/17 0500 02/25/17 0500 02/26/17 0700  Weight: 241 lb 9.6 oz (109.6 kg) 236 lb 6.4 oz (107.2 kg) 235 lb 11.2 oz (106.9 kg)    Telemetry    NSR, PACs.   - Personally Reviewed  ECG    NA - Personally Reviewed  Physical  Exam   GEN: No acute distress.   Neck: No  JVD Cardiac: RRR, no murmurs, rubs, or gallops.  Respiratory: Clear  to auscultation bilaterally. GI: Soft, nontender, non-distended  MS:   Trace edema; No deformity. Neuro:  Nonfocal  Psych: Normal affect   Labs    Chemistry Recent Labs  Lab 02/23/17 1802 02/24/17 0548 02/25/17 0411 02/26/17 0822  NA 140 140 140 140  K 4.0 4.1 3.3* 4.3  CL 100* 99* 96* 96*  CO2 31 30 33* 35*  GLUCOSE 182* 183* 116* 87  BUN 56* 56* 52* 52*  CREATININE 1.87* 1.80* 1.85* 2.17*  CALCIUM 9.1 8.8* 9.1 8.9  PROT 6.4*  --   --   --   ALBUMIN 2.9*  --   --   --   AST 34  --   --   --   ALT 28  --   --   --   ALKPHOS 83  --   --   --   BILITOT 0.8  --   --   --  GFRNONAA 24* 25* 24* 20*  GFRAA 27* 29* 28* 23*  ANIONGAP 9 11 11 9      Hematology Recent Labs  Lab 02/23/17 1802  WBC 9.4  RBC 3.67*  HGB 11.0*  HCT 36.4  MCV 99.2  MCH 30.0  MCHC 30.2  RDW 16.0*  PLT 226    Cardiac EnzymesNo results for input(s): TROPONINI in the last 168 hours. No results for input(s): TROPIPOC in the last 168 hours.   BNP Recent Labs  Lab 02/23/17 1802  BNP 160.2*     DDimer No results for input(s): DDIMER in the last 168 hours.   Radiology    Dg Chest 2 View  Result Date: 02/24/2017 CLINICAL DATA:  Shortness of breath, chronic CHF, chronic renal insufficiency, morbid obesity, nonsmoker. EXAM: CHEST  2 VIEW COMPARISON:  PA and lateral chest x-ray of February 19, 2016 FINDINGS: The lungs are adequately inflated. The interstitial markings are mildly increased. The cardiac silhouette is enlarged. The pulmonary vascularity is not clearly engorged. There is calcification in the wall of the aortic arch as well as within the mitral valvular annulus. The bony thorax exhibits no acute abnormality. There is prominent thoracic kyphosis with multilevel degenerative disc disease. IMPRESSION: No acute pneumonia. Probable underlying chronic bronchitic changes. CHF  with mild central pulmonary vascular prominence but no definite pulmonary edema. Thoracic aortic atherosclerosis. Electronically Signed   By: David  Martinique M.D.   On: 02/24/2017 13:59    Cardiac Studies   NA  Patient Profile     83 y.o. female with history of chronic diastolic CHF, DM and noncompliance with diet She presented to clinic on 02/23/17 withvolume overload that was resistant to efforts to diurese as an outpatient. She was admitted to [facility] for further management for her SOB   Assessment & Plan    ACUTE ON CHRONIC DIASTOLIC HF:  Down about 3.5.  However, needs to have her diuretic reduced secondary to her creat going up.  Stop IV Lasix and stop metolazone.   I would like for her to ambulate without O2 and records stats.    HTN:  BP is well controlled.    CKD:  Creat up as above.  Holding IV diuretics.  She got the first dose today.     For questions or updates, please contact [place] Please consult [email or web address] for contact info under Cardiology/STEMI.   Signed, Minus Breeding, MD  02/26/2017, 10:16 AM

## 2017-02-26 NOTE — Progress Notes (Signed)
Inpatient Diabetes Program Recommendations  AACE/ADA: New Consensus Statement on Inpatient Glycemic Control (2015)  Target Ranges:  Prepandial:   less than 140 mg/dL      Peak postprandial:   less than 180 mg/dL (1-2 hours)      Critically ill patients:  140 - 180 mg/dL   Lab Results  Component Value Date   GLUCAP 115 (H) 02/26/2017   HGBA1C 7.2 (H) 02/19/2016    Review of Glycemic ControlResults for MOSELLA, KASA (MRN [id number]) as of 02/26/2017 12:52  Ref. Range 02/25/2017 15:52 02/25/2017 21:19 02/26/2017 08:05 02/26/2017 08:38 02/26/2017 11:47  Glucose-Capillary Latest Ref Range: 65 - 99 mg/dL 151 (H) 151 (H) 62 (L) 96 115 (H)    Diabetes history: Type 2 DM Outpatient Diabetes medications: Levemir 80 units bid Current orders for Inpatient glycemic control:  Novolog moderate tid with meals and HS Novolog 5 units tid with meals Levemir 80 units bid  Inpatient Diabetes Program Recommendations:    Note fasting glucose<70 mg/dL.  Consider reducing Levemir to 65 units bid while in the hospital.   Thanks,  Adah Perl, RN, BC-ADM Inpatient Diabetes Coordinator Pager [phone] (8a-5p)

## 2017-02-27 LAB — BASIC METABOLIC PANEL
ANION GAP: 9 (ref 5–15)
BUN: 63 mg/dL — ABNORMAL HIGH (ref 6–20)
CALCIUM: 8.6 mg/dL — AB (ref 8.9–10.3)
CO2: 33 mmol/L — ABNORMAL HIGH (ref 22–32)
Chloride: 96 mmol/L — ABNORMAL LOW (ref 101–111)
Creatinine, Ser: 2.53 mg/dL — ABNORMAL HIGH (ref 0.44–1.00)
GFR, EST AFRICAN AMERICAN: 19 mL/min — AB (ref >60)
GFR, EST NON AFRICAN AMERICAN: 16 mL/min — AB (ref >60)
Glucose, Bld: 125 mg/dL — ABNORMAL HIGH (ref 65–99)
POTASSIUM: 4.3 mmol/L (ref 3.5–5.1)
Sodium: 138 mmol/L (ref 135–145)

## 2017-02-27 LAB — GLUCOSE, CAPILLARY
GLUCOSE-CAPILLARY: 104 mg/dL — AB (ref 65–99)
Glucose-Capillary: 182 mg/dL — ABNORMAL HIGH (ref 65–99)
Glucose-Capillary: 190 mg/dL — ABNORMAL HIGH (ref 65–99)
Glucose-Capillary: 179 mg/dL — ABNORMAL HIGH (ref 65–99)

## 2017-02-27 MED ORDER — INSULIN DETEMIR 100 UNIT/ML ~~LOC~~ SOLN
65.0000 [IU] | Freq: Two times a day (BID) | SUBCUTANEOUS | Status: DC
  Administered 2017-02-27 – 2017-03-01 (×4): 65 [IU] via SUBCUTANEOUS
  Filled 2017-02-27 (×6): qty 0.65

## 2017-02-27 NOTE — Progress Notes (Signed)
[facility]   Progress Note  Patient Name: Brandi Melton Date of Encounter: 02/27/2017  Primary Cardiologist:    Dr. Harrington Challenger  Subjective   She says that she is breathing better.  She was able to ambulate in the hallway and felt less breathless.  She has good saturations currently on RA.  However, her creat has increased.   Inpatient Medications    Scheduled Meds: . allopurinol  300 mg Oral QHS  . aspirin EC  81 mg Oral Daily  . calcium carbonate  1 tablet Oral Daily  . carvedilol  80 mg Oral Daily  . chlorhexidine  5 mL Mouth/Throat BID  . cholecalciferol  3,000 Units Oral Daily  . ezetimibe-simvastatin  1 tablet Oral Daily  . folic acid  016 mcg Oral Daily  . guaiFENesin  1,200 mg Oral BID  . heparin  5,000 Units Subcutaneous Q8H  . insulin aspart  0-15 Units Subcutaneous TID WC  . insulin aspart  0-5 Units Subcutaneous QHS  . insulin aspart  5 Units Subcutaneous TID WC  . insulin detemir  80 Units Subcutaneous BID  . magnesium oxide  400 mg Oral Daily  . potassium chloride SA  20 mEq Oral Daily  . sodium chloride flush  3 mL Intravenous Q12H  . Vitamin D (Ergocalciferol)  50,000 Units Oral Q Wed   Continuous Infusions: . sodium chloride     PRN Meds: sodium chloride, acetaminophen, ipratropium-albuterol, ondansetron (ZOFRAN) IV, sodium chloride flush   Vital Signs    Vitals:   02/26/17 1150 02/26/17 1818 02/26/17 2100 02/27/17 0500  BP:   (!) 123/50 (!) 114/47  Pulse: 62 83 74 64  Resp: 20  (!) 24 (!) 22  Temp:   97.9 F (36.6 C) (!) 97.4 F (36.3 C)  TempSrc:   Oral Oral  SpO2: 90% (!) 86% 95% 91%  Weight:    236 lb 11.2 oz (107.4 kg)  Height:        Intake/Output Summary (Last 24 hours) at 02/27/2017 1010 Last data filed at 02/27/2017 0900 Gross per 24 hour  Intake 840 ml  Output 1050 ml  Net -210 ml   Filed Weights   02/25/17 0500 02/26/17 0700 02/27/17 0500  Weight: 236 lb 6.4 oz (107.2 kg) 235 lb 11.2 oz (106.9 kg) 236 lb 11.2 oz (107.4 kg)     Telemetry    NSR.   - Personally Reviewed  ECG    NA - Personally Reviewed  Physical Exam   GEN: No  acute distress.   Neck: No  JVD Cardiac: RRR, no murmurs, rubs, or gallops.  Respiratory: Clear   to auscultation bilaterally. GI: Soft, nontender, non-distended, normal bowel sounds  MS:  No edema; No deformity. Neuro:   Nonfocal  Psych: Oriented and appropriate   Labs    Chemistry Recent Labs  Lab 02/23/17 1802  02/25/17 0411 02/26/17 0822 02/27/17 0550  NA 140   < > 140 140 138  K 4.0   < > 3.3* 4.3 4.3  CL 100*   < > 96* 96* 96*  CO2 31   < > 33* 35* 33*  GLUCOSE 182*   < > 116* 87 125*  BUN 56*   < > 52* 52* 63*  CREATININE 1.87*   < > 1.85* 2.17* 2.53*  CALCIUM 9.1   < > 9.1 8.9 8.6*  PROT 6.4*  --   --   --   --   ALBUMIN 2.9*  --   --   --   --  AST 34  --   --   --   --   ALT 28  --   --   --   --   ALKPHOS 83  --   --   --   --   BILITOT 0.8  --   --   --   --   GFRNONAA 24*   < > 24* 20* 16*  GFRAA 27*   < > 28* 23* 19*  ANIONGAP 9   < > 11 9 9    < > = values in this interval not displayed.     Hematology Recent Labs  Lab 02/23/17 1802  WBC 9.4  RBC 3.67*  HGB 11.0*  HCT 36.4  MCV 99.2  MCH 30.0  MCHC 30.2  RDW 16.0*  PLT 226    Cardiac EnzymesNo results for input(s): TROPONINI in the last 168 hours. No results for input(s): TROPIPOC in the last 168 hours.   BNP Recent Labs  Lab 02/23/17 1802  BNP 160.2*     DDimer No results for input(s): DDIMER in the last 168 hours.   Radiology    No results found.  Cardiac Studies   NA  Patient Profile     83 y.o. female with history of chronic diastolic CHF, DM and noncompliance with diet She presented to clinic on 02/23/17 withvolume overload that was resistant to efforts to diurese as an outpatient. She was admitted to [facility] for further management for her SOB   Assessment & Plan    ACUTE ON CHRONIC DIASTOLIC HF:  Down 3.3 liters since admission.  She did get one dose of  IV Lasix yesterday before this was held.  Creat is up.  She will not tolerate further IV diuresis.  She needs to be managed very conservatively with an emphasis on diet, daily weights, fluid intake limits.  We have talked about this.    HTN:  BP controlled.    CKD:  Creat up as above again today.  Need to observe again tonight and check creat off of diuretic completely.  Need to understand that her creat is coming down before we can suggest PO Lasix dose.    DM:  Will reduce the Levemir while in the hospital.     For questions or updates, please contact [place] Please consult [email or web address] for contact info under Cardiology/STEMI.   Signed, Minus Breeding, MD  02/27/2017, 10:10 AM

## 2017-02-28 DIAGNOSIS — N183 Chronic kidney disease, stage 3 (moderate): Secondary | ICD-10-CM

## 2017-02-28 LAB — BASIC METABOLIC PANEL
Anion gap: 10 (ref 5–15)
BUN: 56 mg/dL — ABNORMAL HIGH (ref 6–20)
CALCIUM: 8.5 mg/dL — AB (ref 8.9–10.3)
CO2: 30 mmol/L (ref 22–32)
Chloride: 97 mmol/L — ABNORMAL LOW (ref 101–111)
Creatinine, Ser: 1.92 mg/dL — ABNORMAL HIGH (ref 0.44–1.00)
GFR, EST AFRICAN AMERICAN: 26 mL/min — AB (ref >60)
GFR, EST NON AFRICAN AMERICAN: 23 mL/min — AB (ref >60)
Glucose, Bld: 70 mg/dL (ref 65–99)
Potassium: 3.8 mmol/L (ref 3.5–5.1)
Sodium: 137 mmol/L (ref 135–145)

## 2017-02-28 LAB — GLUCOSE, CAPILLARY
GLUCOSE-CAPILLARY: 87 mg/dL (ref 65–99)
GLUCOSE-CAPILLARY: 147 mg/dL — AB (ref 65–99)
Glucose-Capillary: 82 mg/dL (ref 65–99)
Glucose-Capillary: 182 mg/dL — ABNORMAL HIGH (ref 65–99)
Glucose-Capillary: 216 mg/dL — ABNORMAL HIGH (ref 65–99)
Glucose-Capillary: 208 mg/dL — ABNORMAL HIGH (ref 65–99)

## 2017-02-28 MED ORDER — TORSEMIDE 20 MG PO TABS
60.0000 mg | ORAL_TABLET | Freq: Every day | ORAL | Status: DC
  Administered 2017-02-28 – 2017-03-01 (×2): 60 mg via ORAL
  Filled 2017-02-28 (×2): qty 3

## 2017-02-28 NOTE — Progress Notes (Signed)
DAILY PROGRESS NOTE   Patient Name: Brandi Melton Date of Encounter: 02/28/2017  Chief Complaint   Breathing is better  Patient Profile   83 y.o. female with history of chronic diastolic CHF, DM and noncompliance with diet She presented to clinic on 02/23/17 withvolume overload that was resistant to efforts to diurese as an outpatient. She was admitted to [facility] for further management for her SOB  Subjective   A little more negative overnight. Very anxious - wants to go home. Weight now 235 lbs. Creatinine improved overnight, now 1.92 today (down from 2.53). Lasix discontinued yesterday.  Objective   Vitals:   02/27/17 1412 02/27/17 1834 02/27/17 2118 02/28/17 0555  BP: (!) 104/54  94/63 (!) 128/49  Pulse: 65 88 71 67  Resp: (!) 26 (!) 25 16 (!) 23  Temp:   98.1 F (36.7 C) 98 F (36.7 C)  TempSrc:   Oral Oral  SpO2: 97% (!) 89% 99% 96%  Weight:    235 lb 14.4 oz (107 kg)  Height:        Intake/Output Summary (Last 24 hours) at 02/28/2017 1027 Last data filed at 02/28/2017 0900 Gross per 24 hour  Intake 960 ml  Output 1550 ml  Net -590 ml   Filed Weights   02/26/17 0700 02/27/17 0500 02/28/17 0555  Weight: 235 lb 11.2 oz (106.9 kg) 236 lb 11.2 oz (107.4 kg) 235 lb 14.4 oz (107 kg)    Physical Exam   General appearance: alert, no distress and morbidly obese Lungs: clear to auscultation bilaterally Heart: regular rate and rhythm Extremities: edema trace to 1+ pitting Neurologic: Grossly normal  Inpatient Medications    Scheduled Meds: . allopurinol  300 mg Oral QHS  . aspirin EC  81 mg Oral Daily  . calcium carbonate  1 tablet Oral Daily  . carvedilol  80 mg Oral Daily  . chlorhexidine  5 mL Mouth/Throat BID  . cholecalciferol  3,000 Units Oral Daily  . ezetimibe-simvastatin  1 tablet Oral Daily  . folic acid  237 mcg Oral Daily  . guaiFENesin  1,200 mg Oral BID  . heparin  5,000 Units Subcutaneous Q8H  . insulin aspart  0-15 Units Subcutaneous TID  WC  . insulin aspart  0-5 Units Subcutaneous QHS  . insulin aspart  5 Units Subcutaneous TID WC  . insulin detemir  65 Units Subcutaneous BID  . magnesium oxide  400 mg Oral Daily  . potassium chloride SA  20 mEq Oral Daily  . sodium chloride flush  3 mL Intravenous Q12H  . Vitamin D (Ergocalciferol)  50,000 Units Oral Q Wed    Continuous Infusions: . sodium chloride      PRN Meds: sodium chloride, acetaminophen, ipratropium-albuterol, ondansetron (ZOFRAN) IV, sodium chloride flush   Labs   Results for orders placed or performed during the hospital encounter of 02/23/17 (from the past 48 hour(s))  Glucose, capillary     Status: Abnormal   Collection Time: 02/26/17 11:47 AM  Result Value Ref Range   Glucose-Capillary 115 (H) 65 - 99 mg/dL  Glucose, capillary     Status: Abnormal   Collection Time: 02/26/17  4:03 PM  Result Value Ref Range   Glucose-Capillary 147 (H) 65 - 99 mg/dL  Glucose, capillary     Status: Abnormal   Collection Time: 02/26/17  9:33 PM  Result Value Ref Range   Glucose-Capillary 162 (H) 65 - 99 mg/dL  Basic metabolic panel     Status: Abnormal  Collection Time: 02/27/17  5:50 AM  Result Value Ref Range   Sodium 138 135 - 145 mmol/L   Potassium 4.3 3.5 - 5.1 mmol/L   Chloride 96 (L) 101 - 111 mmol/L   CO2 33 (H) 22 - 32 mmol/L   Glucose, Bld 125 (H) 65 - 99 mg/dL   BUN 63 (H) 6 - 20 mg/dL   Creatinine, Ser 2.53 (H) 0.44 - 1.00 mg/dL   Calcium 8.6 (L) 8.9 - 10.3 mg/dL   GFR calc non Af Amer 16 (L) >60 mL/min   GFR calc Af Amer 19 (L) >60 mL/min    Comment: (NOTE) The eGFR has been calculated using the CKD EPI equation. This calculation has not been validated in all clinical situations. eGFR's persistently <60 mL/min signify possible Chronic Kidney Disease.    Anion gap 9 5 - 15  Glucose, capillary     Status: Abnormal   Collection Time: 02/27/17  8:15 AM  Result Value Ref Range   Glucose-Capillary 104 (H) 65 - 99 mg/dL  Glucose, capillary      Status: Abnormal   Collection Time: 02/27/17 11:58 AM  Result Value Ref Range   Glucose-Capillary 182 (H) 65 - 99 mg/dL  Glucose, capillary     Status: Abnormal   Collection Time: 02/27/17  3:55 PM  Result Value Ref Range   Glucose-Capillary 190 (H) 65 - 99 mg/dL  Glucose, capillary     Status: Abnormal   Collection Time: 02/27/17  9:19 PM  Result Value Ref Range   Glucose-Capillary 179 (H) 65 - 99 mg/dL  Basic metabolic panel     Status: Abnormal   Collection Time: 02/28/17  5:40 AM  Result Value Ref Range   Sodium 137 135 - 145 mmol/L   Potassium 3.8 3.5 - 5.1 mmol/L   Chloride 97 (L) 101 - 111 mmol/L   CO2 30 22 - 32 mmol/L   Glucose, Bld 70 65 - 99 mg/dL   BUN 56 (H) 6 - 20 mg/dL   Creatinine, Ser 1.92 (H) 0.44 - 1.00 mg/dL   Calcium 8.5 (L) 8.9 - 10.3 mg/dL   GFR calc non Af Amer 23 (L) >60 mL/min   GFR calc Af Amer 26 (L) >60 mL/min    Comment: (NOTE) The eGFR has been calculated using the CKD EPI equation. This calculation has not been validated in all clinical situations. eGFR's persistently <60 mL/min signify possible Chronic Kidney Disease.    Anion gap 10 5 - 15  Glucose, capillary     Status: None   Collection Time: 02/28/17  5:50 AM  Result Value Ref Range   Glucose-Capillary 82 65 - 99 mg/dL  Glucose, capillary     Status: None   Collection Time: 02/28/17  7:41 AM  Result Value Ref Range   Glucose-Capillary 87 65 - 99 mg/dL    ECG   N/A  Telemetry   Sinus rhythm - Personally Reviewed  Radiology    No results found.  Cardiac Studies   N/A  Assessment   Principal Problem:   Acute on chronic diastolic heart failure (HCC) Active Problems:   CKD (chronic kidney disease), stage III (HCC)   Hypertensive heart disease   Plan   1. Creatinine improved after holding IV lasix - excellent diuresis. Breathing has improved. Will restart home torsemide at an increased dose today, possible d/c home tomorrow if creatinine stable and she is  negative.  Time Spent Directly with Patient:  I have spent a total  of 15 minutes with the patient reviewing hospital notes, telemetry, EKGs, labs and examining the patient as well as establishing an assessment and plan that was discussed personally with the patient. > 50% of time was spent in direct patient care.  Length of Stay:  LOS: 4 days   Pixie Casino, MD, [facility], [place] Director of the [facility]  [facility] Attending Cardiologist  Direct Dial: [phone]  Fax: [phone]  Website:  [email or web address] 02/28/2017, 10:27 AM

## 2017-03-01 ENCOUNTER — Other Ambulatory Visit: Payer: Self-pay | Admitting: Adult Health

## 2017-03-01 DIAGNOSIS — I110 Hypertensive heart disease with heart failure: Secondary | ICD-10-CM

## 2017-03-01 LAB — GLUCOSE, CAPILLARY
Glucose-Capillary: 115 mg/dL — ABNORMAL HIGH (ref 65–99)
Glucose-Capillary: 146 mg/dL — ABNORMAL HIGH (ref 65–99)

## 2017-03-01 LAB — BASIC METABOLIC PANEL
ANION GAP: 7 (ref 5–15)
BUN: 58 mg/dL — ABNORMAL HIGH (ref 6–20)
CALCIUM: 8.8 mg/dL — AB (ref 8.9–10.3)
CO2: 33 mmol/L — ABNORMAL HIGH (ref 22–32)
CREATININE: 1.99 mg/dL — AB (ref 0.44–1.00)
Chloride: 98 mmol/L — ABNORMAL LOW (ref 101–111)
GFR, EST AFRICAN AMERICAN: 25 mL/min — AB (ref >60)
GFR, EST NON AFRICAN AMERICAN: 22 mL/min — AB (ref >60)
Glucose, Bld: 129 mg/dL — ABNORMAL HIGH (ref 65–99)
Potassium: 4.1 mmol/L (ref 3.5–5.1)
SODIUM: 138 mmol/L (ref 135–145)

## 2017-03-01 MED ORDER — TORSEMIDE 20 MG PO TABS: 60.0000 mg | ORAL_TABLET | Freq: Every day | ORAL | 5 refills | Status: DC

## 2017-03-01 NOTE — Discharge Instructions (Signed)
Heart Failure °Heart failure means your heart has trouble pumping blood. This makes it hard for your body to work well. Heart failure is usually a long-term (chronic) condition. You must take good care of yourself and follow your doctor's treatment plan. °Follow these instructions at home: °· Take your heart medicine as told by your doctor. °? Do not stop taking medicine unless your doctor tells you to. °? Do not skip any dose of medicine. °? Refill your medicines before they run out. °? Take other medicines only as told by your doctor or pharmacist. °· Stay active if told by your doctor. The elderly and people with severe heart failure should talk with a doctor about physical activity. °· Eat heart-healthy foods. Choose foods that are without trans fat and are low in saturated fat, cholesterol, and salt (sodium). This includes fresh or frozen fruits and vegetables, fish, lean meats, fat-free or low-fat dairy foods, whole grains, and high-fiber foods. Lentils and dried peas and beans (legumes) are also good choices. °· Limit salt if told by your doctor. °· Cook in a healthy way. Roast, grill, broil, bake, poach, steam, or stir-fry foods. °· Limit fluids as told by your doctor. °· Weigh yourself every morning. Do this after you pee (urinate) and before you eat breakfast. Write down your weight to give to your doctor. °· Take your blood pressure and write it down if your doctor tells you to. °· Ask your doctor how to check your pulse. Check your pulse as told. °· Lose weight if told by your doctor. °· Stop smoking or chewing tobacco. Do not use gum or patches that help you quit without your doctor's approval. °· Schedule and go to doctor visits as told. °· Nonpregnant women should have no more than 1 drink a day. Men should have no more than 2 drinks a day. Talk to your doctor about drinking alcohol. °· Stop illegal drug use. °· Stay current with shots (immunizations). °· Manage your health conditions as told by your  doctor. °· Learn to manage your stress. °· Rest when you are tired. °· If it is really hot outside: °? Avoid intense activities. °? Use air conditioning or fans, or get in a cooler place. °? Avoid caffeine and alcohol. °? Wear loose-fitting, lightweight, and light-colored clothing. °· If it is really cold outside: °? Avoid intense activities. °? Layer your clothing. °? Wear mittens or gloves, a hat, and a scarf when going outside. °? Avoid alcohol. °· Learn about heart failure and get support as needed. °· Get help to maintain or improve your quality of life and your ability to care for yourself as needed. °Contact a doctor if: °· You gain weight quickly. °· You are more short of breath than usual. °· You cannot do your normal activities. °· You tire easily. °· You cough more than normal, especially with activity. °· You have any or more puffiness (swelling) in areas such as your hands, feet, ankles, or belly (abdomen). °· You cannot sleep because it is hard to breathe. °· You feel like your heart is beating fast (palpitations). °· You get dizzy or light-headed when you stand up. °Get help right away if: °· You have trouble breathing. °· There is a change in mental status, such as becoming less alert or not being able to focus. °· You have chest pain or discomfort. °· You faint. °This information is not intended to replace advice given to you by your health care provider. Make sure you   discuss any questions you have with your health care provider. °Document Released: 01/08/2008 Document Revised: 09/06/2015 Document Reviewed: 05/17/2012 °Elsevier Interactive Patient Education © 2017 [employer] ° °

## 2017-03-01 NOTE — Progress Notes (Signed)
Discharged home, self care. Daughter [name] present at the time of D/C.

## 2017-03-01 NOTE — Progress Notes (Signed)
Progress Note  Patient Name: Brandi Melton Date of Encounter: 03/01/2017  Primary Cardiologist: Dorris Carnes, MD  Subjective   Anxious, wants to go home. Breathing some better. Has walked in halls with mild dyspnea when she finished.   Inpatient Medications    Scheduled Meds: . allopurinol  300 mg Oral QHS  . aspirin EC  81 mg Oral Daily  . calcium carbonate  1 tablet Oral Daily  . carvedilol  80 mg Oral Daily  . chlorhexidine  5 mL Mouth/Throat BID  . cholecalciferol  3,000 Units Oral Daily  . ezetimibe-simvastatin  1 tablet Oral Daily  . folic acid  409 mcg Oral Daily  . guaiFENesin  1,200 mg Oral BID  . heparin  5,000 Units Subcutaneous Q8H  . insulin aspart  0-15 Units Subcutaneous TID WC  . insulin aspart  0-5 Units Subcutaneous QHS  . insulin aspart  5 Units Subcutaneous TID WC  . insulin detemir  65 Units Subcutaneous BID  . magnesium oxide  400 mg Oral Daily  . potassium chloride SA  20 mEq Oral Daily  . sodium chloride flush  3 mL Intravenous Q12H  . torsemide  60 mg Oral Daily  . Vitamin D (Ergocalciferol)  50,000 Units Oral Q Wed   Continuous Infusions: . sodium chloride     PRN Meds: sodium chloride, acetaminophen, ipratropium-albuterol, ondansetron (ZOFRAN) IV, sodium chloride flush   Vital Signs    Vitals:   02/28/17 0555 02/28/17 1500 02/28/17 1926 03/01/17 0546  BP: (!) 128/49 (!) 105/40 (!) 122/57 (!) 121/47  Pulse: 67 64 70 69  Resp: (!) 23 (!) 22 13 (!) 24  Temp: 98 F (36.7 C) 98.2 F (36.8 C) 97.7 F (36.5 C) 97.8 F (36.6 C)  TempSrc: Oral Oral Oral Axillary  SpO2: 96%  96% 96%  Weight: 107 kg (235 lb 14.4 oz)   104.8 kg (231 lb 0.7 oz)  Height:        Intake/Output Summary (Last 24 hours) at 03/01/2017 0935 Last data filed at 03/01/2017 0730 Gross per 24 hour  Intake 483 ml  Output 1900 ml  Net -1417 ml   Filed Weights   02/27/17 0500 02/28/17 0555 03/01/17 0546  Weight: 107.4 kg (236 lb 11.2 oz) 107 kg (235 lb 14.4 oz) 104.8  kg (231 lb 0.7 oz)    Telemetry    SR with frequent PAC's - Personally Reviewed  Physical Exam   GEN: No acute distress.   Neck: No JVD Cardiac: RRR,distant heart sounds,  no murmurs, rubs, or gallops.  Respiratory: Clear to auscultation bilaterally.Some expiratory wheezes in the upper lobes.  GI: Soft, nontender, non-distended Obese MS: No edema; No deformity.Wearing support hose.  Neuro:  Nonfocal  Psych: Normal affect Anxious.   Labs    Chemistry Recent Labs  Lab 02/23/17 1802  02/27/17 0550 02/28/17 0540 03/01/17 0500  NA 140   < > 138 137 138  K 4.0   < > 4.3 3.8 4.1  CL 100*   < > 96* 97* 98*  CO2 31   < > 33* 30 33*  GLUCOSE 182*   < > 125* 70 129*  BUN 56*   < > 63* 56* 58*  CREATININE 1.87*   < > 2.53* 1.92* 1.99*  CALCIUM 9.1   < > 8.6* 8.5* 8.8*  PROT 6.4*  --   --   --   --   ALBUMIN 2.9*  --   --   --   --  AST 34  --   --   --   --   ALT 28  --   --   --   --   ALKPHOS 83  --   --   --   --   BILITOT 0.8  --   --   --   --   GFRNONAA 24*   < > 16* 23* 22*  GFRAA 27*   < > 19* 26* 25*  ANIONGAP 9   < > 9 10 7    < > = values in this interval not displayed.     Hematology Recent Labs  Lab 02/23/17 1802  WBC 9.4  RBC 3.67*  HGB 11.0*  HCT 36.4  MCV 99.2  MCH 30.0  MCHC 30.2  RDW 16.0*  PLT 226     BNP Recent Labs  Lab 02/23/17 1802  BNP 160.2*     DDimer No results for input(s): DDIMER in the last 168 hours.   Radiology    No results found.  Cardiac Studies   Echocardiogram 07/03/2015 Left ventricle: The cavity size was normal. Systolic function was   vigorous. The estimated ejection fraction was in the range of 65%   to 70%. Wall motion was normal; there were no regional wall   motion abnormalities. Doppler parameters are consistent with   abnormal left ventricular relaxation (grade 1 diastolic   dysfunction). Doppler parameters are consistent with elevated   ventricular end-diastolic filling pressure. - Aortic valve:  Trileaflet; normal thickness leaflets.   Transvalvular velocity was within the normal range. There was no   stenosis. There was no regurgitation. - Aortic root: The aortic root was normal in size. - Mitral valve: Severe mitral annular calcifications, predominantly   posterior. Calcified annulus. Mildly thickened leaflets . There   was no regurgitation. - Right ventricle: The cavity size was normal. Wall thickness was   normal. Systolic function was normal. - Tricuspid valve: There was no regurgitation. - Pulmonary arteries: Systolic pressure was within the normal   range.  Patient Profile     83 y.o. female with history of chronic diastolic CHF, DM and noncompliance with diet She presented to clinic on 02/23/17 withvolume overload that was resistant to efforts to diurese as an outpatient. She was admitted to [facility] for further management for her SOB  Assessment & Plan    1. Acute on Chronic Diastolic CHF: Overall diuresis since admission of 5.3 liters. Weight down from highest recorded of 24` lbs, (109.6 kg) to 231 lbs (104.8 kg). She has been changed to po torsemide 60 mg daily. Creatinine 1.99 (1.92 02/28/2017). Potassium 4.1. She is feeling better and is anxious to return home. She lives at [facility]. Will need to adhere to low sodium diet as OP.   2. CKD: Stage III: Baseline Creatinine bases upon trends appears to be 1.20. Currently 1.99. Will need follow up BMET after discharge.   3. Hypertensive Heart Disease: Most recent echocardiogram with normal LV fx in 2017, with Grade I diastolic dysfunction.      Continue coreg CR 80 mg daily, currently not on ACE or ARB due to #2. BP is stable.  Signed, Phill Myron. West Pugh, ANP, AACC 03/01/2017, 9:35 AM    The patient was seen, examined and discussed with Jory Sims, NP and I agree with the above.   The patient has improved significantly, she diuresed 5 L, improved kidney function, we will discharge on the  current medical regimen and arrange an  early outpatient follow up for medication titration.  Ena Dawley, MD 03/01/2017

## 2017-03-01 NOTE — Discharge Summary (Signed)
Discharge Summary    Patient ID: Brandi Melton,  MRN: [id number], DOB/AGE: 05/27/1930 83 y.o.  Admit date: 02/23/2017 Discharge date: 03/01/2017  Primary Care Provider: Haywood Pao Primary Cardiologist: Dr. Harrington Challenger   Discharge Diagnoses    Principal Problem:   Acute on chronic diastolic heart failure [facility]) Active Problems:   CKD (chronic kidney disease), stage III ([place])   Hypertensive heart disease   Allergies No Known Allergies  Diagnostic Studies/Procedures    Echocardiogram 07/03/2015 Left ventricle: The cavity size was normal. Systolic function was vigorous. The estimated ejection fraction was in the range of 65% to 70%. Wall motion was normal; there were no regional wall motion abnormalities. Doppler parameters are consistent with abnormal left ventricular relaxation (grade 1 diastolic dysfunction). Doppler parameters are consistent with elevated ventricular end-diastolic filling pressure. - Aortic valve: Trileaflet; normal thickness leaflets. Transvalvular velocity was within the normal range. There was no stenosis. There was no regurgitation. - Aortic root: The aortic root was normal in size. - Mitral valve: Severe mitral annular calcifications, predominantly posterior. Calcified annulus. Mildly thickened leaflets . There was no regurgitation. - Right ventricle: The cavity size was normal. Wall thickness was normal. Systolic function was normal. - Tricuspid valve: There was no regurgitation. - Pulmonary arteries: Systolic pressure was within the normal range.     History of Present Illness     83 y.o. female with history of chronic diastolic CHF, DM and noncompliance with diet She presented to clinic on 02/23/17 withvolume overload that was resistant to efforts to diurese as an outpatient. She was admitted to [facility] for further management for her SOB/ acute on chronic diastolic CHF.   Hospital Course     1. Acute on Chronic  Diastolic CHF: Overall diuresis since admission of 5.3 liters. Weight down from highest recorded of 24` lbs, (109.6 kg) to 231 lbs (104.8 kg). She has been changed to po torsemide 60 mg daily. Creatinine 1.99 (1.92 02/28/2017). Potassium 4.1. She is feeling better and is anxious to return home. She lives at [facility]. Will need to adhere to low sodium diet as OP.   2. CKD: Stage III: Baseline Creatinine bases upon trends appears to be 1.20. Currently 1.99. Will need follow up BMET after discharge.   3. Hypertensive Heart Disease: Most recent echocardiogram with normal LV fx in 2017, with Grade I diastolic dysfunction.      Continue coreg CR 80 mg daily, currently not on ACE or ARB due to #2. BP is stable.   On 03/01/17, pt was seen and examined by Dr. Meda Coffee, who determined she was stable for discharge back to [place]. Post hospital f/u will be arranged and she will need a f/u BMP at time of clinic f/u.   Consultants: none    Discharge Vitals Blood pressure (!) 121/47, pulse 69, temperature 97.8 F (36.6 C), temperature source Axillary, resp. rate (!) 24, height 5\' 3"  (1.6 m), weight 231 lb 0.7 oz (104.8 kg), SpO2 96 %.  Filed Weights   02/27/17 0500 02/28/17 0555 03/01/17 0546  Weight: 236 lb 11.2 oz (107.4 kg) 235 lb 14.4 oz (107 kg) 231 lb 0.7 oz (104.8 kg)    Labs & Radiologic Studies    CBC No results for input(s): WBC, NEUTROABS, HGB, HCT, MCV, PLT in the last 72 hours. Basic Metabolic Panel Recent Labs    02/28/17 0540 03/01/17 0500  NA 137 138  K 3.8 4.1  CL 97* 98*  CO2  30 33*  GLUCOSE 70 129*  BUN 56* 58*  CREATININE 1.92* 1.99*  CALCIUM 8.5* 8.8*   Liver Function Tests No results for input(s): AST, ALT, ALKPHOS, BILITOT, PROT, ALBUMIN in the last 72 hours. No results for input(s): LIPASE, AMYLASE in the last 72 hours. Cardiac Enzymes No results for input(s): CKTOTAL, CKMB, CKMBINDEX, TROPONINI in the last 72  hours. BNP Invalid input(s): POCBNP D-Dimer No results for input(s): DDIMER in the last 72 hours. Hemoglobin A1C No results for input(s): HGBA1C in the last 72 hours. Fasting Lipid Panel No results for input(s): CHOL, HDL, LDLCALC, TRIG, CHOLHDL, LDLDIRECT in the last 72 hours. Thyroid Function Tests No results for input(s): TSH, T4TOTAL, T3FREE, THYROIDAB in the last 72 hours.  Invalid input(s): FREET3 _____________  Dg Chest 2 View  Result Date: 02/24/2017 CLINICAL DATA:  Shortness of breath, chronic CHF, chronic renal insufficiency, morbid obesity, nonsmoker. EXAM: CHEST  2 VIEW COMPARISON:  PA and lateral chest x-ray of February 19, 2016 FINDINGS: The lungs are adequately inflated. The interstitial markings are mildly increased. The cardiac silhouette is enlarged. The pulmonary vascularity is not clearly engorged. There is calcification in the wall of the aortic arch as well as within the mitral valvular annulus. The bony thorax exhibits no acute abnormality. There is prominent thoracic kyphosis with multilevel degenerative disc disease. IMPRESSION: No acute pneumonia. Probable underlying chronic bronchitic changes. CHF with mild central pulmonary vascular prominence but no definite pulmonary edema. Thoracic aortic atherosclerosis. Electronically Signed   By: David  Martinique M.D.   On: 02/24/2017 13:59   Disposition   Pt is being discharged home today in good condition.  Follow-up Plans & Appointments    Follow-up Information    Fay Records, MD Follow up.   Specialty:  Cardiology Why:  our office will call you with a follow-up appt in 1-2 weeks Contact information: [place] [phone]          Discharge Instructions    Diet - low sodium heart healthy   Complete by:  As directed    Increase activity slowly   Complete by:  As directed       Discharge Medications   Current Discharge Medication List    CONTINUE these  medications which have CHANGED   Details  torsemide (DEMADEX) 20 MG tablet Take 3 tablets (60 mg total) daily by mouth. Qty: 90 tablet, Refills: 5   Associated Diagnoses: Chronic diastolic CHF (congestive heart failure) ([place]); Essential hypertension      CONTINUE these medications which have NOT CHANGED   Details  allopurinol (ZYLOPRIM) 300 MG tablet Take 300 mg by mouth at bedtime.     aspirin EC 81 MG tablet Take 81 mg by mouth daily.    calcium carbonate (TUMS EX) 750 MG chewable tablet Chew 1 tablet by mouth daily.    carvedilol (COREG CR) 80 MG 24 hr capsule Take 1 capsule (80 mg total) by mouth daily. Qty: 90 capsule, Refills: 3    chlorhexidine (PERIDEX) 0.12 % solution Use as directed 5 mLs in the mouth or throat 2 (two) times daily.     cholecalciferol (VITAMIN D) 1000 units tablet Take 3,000 Units by mouth daily.    ezetimibe-simvastatin (VYTORIN) 10-40 MG tablet Take 1 tablet by mouth daily. Qty: 90 tablet, Refills: 3    folic acid (FOLVITE) 354 MCG tablet Take 400 mcg by mouth daily.    insulin aspart (NOVOLOG) 100 UNIT/ML injection Inject 35 Units  into the skin 4 (four) times daily.     Insulin Detemir (LEVEMIR) 100 UNIT/ML Pen Inject 80 Units into the skin 2 (two) times daily.     magnesium oxide (MAG-OX) 400 MG tablet Take 1 tablet (400 mg total) by mouth daily. Qty: 90 tablet, Refills: 3    potassium chloride SA (K-DUR,KLOR-CON) 20 MEQ tablet Take 1 tablet (20 mEq total) by mouth daily. Qty: 90 tablet, Refills: 3    Vitamin D, Ergocalciferol, (DRISDOL) 50000 UNITS CAPS capsule Take 50,000 Units by mouth every Wednesday.       STOP taking these medications     metolazone (ZAROXOLYN) 5 MG tablet      telmisartan (MICARDIS) 20 MG tablet         Outstanding Labs/Studies   F/u BMP at time of post hospital f/u visit   Duration of Discharge Encounter   Greater than 30 minutes including physician time.  Signed, Lyda Jester PA-C 03/01/2017, 10:18  AM

## 2017-03-01 NOTE — Care Management Note (Signed)
Case Management Note  Patient Details  Name: AMELI SANGIOVANNI MRN: [id number] Date of Birth: 1930-11-26  Subjective/Objective:                 Patient with order to DC to home today. Chart reviewed. No Home Health or Equipment needs, no unacknowledged Case Management consults or medication needs identified at the time of this note. Plan for DC to home.  CM signing off. If new needs arise today prior to discharge, please call Carles Collet RN CM at [phone].   Action/Plan:   Expected Discharge Date:  03/01/17               Expected Discharge Plan:  Home/Self Care  In-House Referral:     Discharge planning Services  CM Consult  Post Acute Care Choice:    Choice offered to:     DME Arranged:    DME Agency:     HH Arranged:    HH Agency:     Status of Service:  Completed, signed off  If discussed at [employer] of Stay Meetings, dates discussed:    Additional Comments:  Carles Collet, RN 03/01/2017, 12:02 PM

## 2017-03-18 DIAGNOSIS — M109 Gout, unspecified: Secondary | ICD-10-CM | POA: Diagnosis not present

## 2017-03-18 DIAGNOSIS — I10 Essential (primary) hypertension: Secondary | ICD-10-CM | POA: Diagnosis not present

## 2017-03-18 DIAGNOSIS — E7800 Pure hypercholesterolemia, unspecified: Secondary | ICD-10-CM | POA: Diagnosis not present

## 2017-03-18 DIAGNOSIS — Z794 Long term (current) use of insulin: Secondary | ICD-10-CM | POA: Diagnosis not present

## 2017-03-18 DIAGNOSIS — E1151 Type 2 diabetes mellitus with diabetic peripheral angiopathy without gangrene: Secondary | ICD-10-CM | POA: Diagnosis not present

## 2017-03-18 DIAGNOSIS — Z6841 Body Mass Index (BMI) 40.0 and over, adult: Secondary | ICD-10-CM | POA: Diagnosis not present

## 2017-03-18 DIAGNOSIS — N184 Chronic kidney disease, stage 4 (severe): Secondary | ICD-10-CM | POA: Diagnosis not present

## 2017-03-24 ENCOUNTER — Ambulatory Visit: Payer: Medicare Other | Admitting: Physician Assistant

## 2017-03-26 DIAGNOSIS — D631 Anemia in chronic kidney disease: Secondary | ICD-10-CM | POA: Diagnosis not present

## 2017-03-26 DIAGNOSIS — N184 Chronic kidney disease, stage 4 (severe): Secondary | ICD-10-CM | POA: Diagnosis not present

## 2017-03-26 DIAGNOSIS — E1129 Type 2 diabetes mellitus with other diabetic kidney complication: Secondary | ICD-10-CM | POA: Diagnosis not present

## 2017-03-26 DIAGNOSIS — I5032 Chronic diastolic (congestive) heart failure: Secondary | ICD-10-CM | POA: Diagnosis not present

## 2017-03-26 DIAGNOSIS — Z0000 Encounter for general adult medical examination without abnormal findings: Secondary | ICD-10-CM | POA: Diagnosis not present

## 2017-03-26 DIAGNOSIS — M109 Gout, unspecified: Secondary | ICD-10-CM | POA: Diagnosis not present

## 2017-03-26 DIAGNOSIS — Z1389 Encounter for screening for other disorder: Secondary | ICD-10-CM | POA: Diagnosis not present

## 2017-03-26 DIAGNOSIS — Z794 Long term (current) use of insulin: Secondary | ICD-10-CM | POA: Diagnosis not present

## 2017-03-26 DIAGNOSIS — Z6841 Body Mass Index (BMI) 40.0 and over, adult: Secondary | ICD-10-CM | POA: Diagnosis not present

## 2017-03-26 DIAGNOSIS — E1151 Type 2 diabetes mellitus with diabetic peripheral angiopathy without gangrene: Secondary | ICD-10-CM | POA: Diagnosis not present

## 2017-03-26 DIAGNOSIS — I130 Hypertensive heart and chronic kidney disease with heart failure and stage 1 through stage 4 chronic kidney disease, or unspecified chronic kidney disease: Secondary | ICD-10-CM | POA: Diagnosis not present

## 2017-04-03 DIAGNOSIS — Z6841 Body Mass Index (BMI) 40.0 and over, adult: Secondary | ICD-10-CM | POA: Diagnosis not present

## 2017-04-03 DIAGNOSIS — R05 Cough: Secondary | ICD-10-CM | POA: Diagnosis not present

## 2017-04-03 DIAGNOSIS — J40 Bronchitis, not specified as acute or chronic: Secondary | ICD-10-CM | POA: Diagnosis not present

## 2017-04-03 DIAGNOSIS — I5023 Acute on chronic systolic (congestive) heart failure: Secondary | ICD-10-CM | POA: Diagnosis not present

## 2017-04-03 DIAGNOSIS — E1151 Type 2 diabetes mellitus with diabetic peripheral angiopathy without gangrene: Secondary | ICD-10-CM | POA: Diagnosis not present

## 2017-04-04 ENCOUNTER — Encounter (HOSPITAL_COMMUNITY): Payer: Self-pay | Admitting: Family Medicine

## 2017-04-04 ENCOUNTER — Other Ambulatory Visit: Payer: Self-pay | Admitting: Physician Assistant

## 2017-04-04 ENCOUNTER — Inpatient Hospital Stay (HOSPITAL_COMMUNITY)
Admission: EM | Admit: 2017-04-04 | Discharge: 2017-04-11 | DRG: 291 | Disposition: A | Discharge status: Home Health | Payer: Medicare Other | Attending: Internal Medicine | Admitting: Internal Medicine

## 2017-04-04 ENCOUNTER — Emergency Department (HOSPITAL_COMMUNITY): Payer: Medicare Other

## 2017-04-04 ENCOUNTER — Other Ambulatory Visit: Payer: Self-pay

## 2017-04-04 DIAGNOSIS — J209 Acute bronchitis, unspecified: Secondary | ICD-10-CM

## 2017-04-04 DIAGNOSIS — Z961 Presence of intraocular lens: Secondary | ICD-10-CM | POA: Diagnosis present

## 2017-04-04 DIAGNOSIS — N390 Urinary tract infection, site not specified: Secondary | ICD-10-CM | POA: Diagnosis not present

## 2017-04-04 DIAGNOSIS — R269 Unspecified abnormalities of gait and mobility: Secondary | ICD-10-CM | POA: Diagnosis present

## 2017-04-04 DIAGNOSIS — R739 Hyperglycemia, unspecified: Secondary | ICD-10-CM | POA: Diagnosis not present

## 2017-04-04 DIAGNOSIS — G92 Toxic encephalopathy: Secondary | ICD-10-CM | POA: Diagnosis present

## 2017-04-04 DIAGNOSIS — E1122 Type 2 diabetes mellitus with diabetic chronic kidney disease: Secondary | ICD-10-CM | POA: Diagnosis present

## 2017-04-04 DIAGNOSIS — I5032 Chronic diastolic (congestive) heart failure: Secondary | ICD-10-CM | POA: Diagnosis not present

## 2017-04-04 DIAGNOSIS — I340 Nonrheumatic mitral (valve) insufficiency: Secondary | ICD-10-CM | POA: Diagnosis not present

## 2017-04-04 DIAGNOSIS — R945 Abnormal results of liver function studies: Secondary | ICD-10-CM | POA: Diagnosis present

## 2017-04-04 DIAGNOSIS — R4182 Altered mental status, unspecified: Secondary | ICD-10-CM | POA: Diagnosis not present

## 2017-04-04 DIAGNOSIS — Z6841 Body Mass Index (BMI) 40.0 and over, adult: Secondary | ICD-10-CM | POA: Diagnosis not present

## 2017-04-04 DIAGNOSIS — J208 Acute bronchitis due to other specified organisms: Secondary | ICD-10-CM | POA: Diagnosis not present

## 2017-04-04 DIAGNOSIS — I130 Hypertensive heart and chronic kidney disease with heart failure and stage 1 through stage 4 chronic kidney disease, or unspecified chronic kidney disease: Principal | ICD-10-CM | POA: Diagnosis present

## 2017-04-04 DIAGNOSIS — R062 Wheezing: Secondary | ICD-10-CM | POA: Diagnosis not present

## 2017-04-04 DIAGNOSIS — E160 Drug-induced hypoglycemia without coma: Secondary | ICD-10-CM

## 2017-04-04 DIAGNOSIS — Z794 Long term (current) use of insulin: Secondary | ICD-10-CM

## 2017-04-04 DIAGNOSIS — M069 Rheumatoid arthritis, unspecified: Secondary | ICD-10-CM | POA: Diagnosis present

## 2017-04-04 DIAGNOSIS — M1711 Unilateral primary osteoarthritis, right knee: Secondary | ICD-10-CM | POA: Diagnosis present

## 2017-04-04 DIAGNOSIS — G9340 Encephalopathy, unspecified: Secondary | ICD-10-CM | POA: Diagnosis not present

## 2017-04-04 DIAGNOSIS — E876 Hypokalemia: Secondary | ICD-10-CM | POA: Diagnosis not present

## 2017-04-04 DIAGNOSIS — D72829 Elevated white blood cell count, unspecified: Secondary | ICD-10-CM | POA: Diagnosis present

## 2017-04-04 DIAGNOSIS — R059 Cough, unspecified: Secondary | ICD-10-CM

## 2017-04-04 DIAGNOSIS — R05 Cough: Secondary | ICD-10-CM | POA: Diagnosis not present

## 2017-04-04 DIAGNOSIS — E662 Morbid (severe) obesity with alveolar hypoventilation: Secondary | ICD-10-CM | POA: Diagnosis present

## 2017-04-04 DIAGNOSIS — T383X5A Adverse effect of insulin and oral hypoglycemic [antidiabetic] drugs, initial encounter: Secondary | ICD-10-CM

## 2017-04-04 DIAGNOSIS — R0902 Hypoxemia: Secondary | ICD-10-CM | POA: Diagnosis not present

## 2017-04-04 DIAGNOSIS — N184 Chronic kidney disease, stage 4 (severe): Secondary | ICD-10-CM | POA: Diagnosis present

## 2017-04-04 DIAGNOSIS — Z8249 Family history of ischemic heart disease and other diseases of the circulatory system: Secondary | ICD-10-CM

## 2017-04-04 DIAGNOSIS — J9621 Acute and chronic respiratory failure with hypoxia: Secondary | ICD-10-CM | POA: Diagnosis present

## 2017-04-04 DIAGNOSIS — E0965 Drug or chemical induced diabetes mellitus with hyperglycemia: Secondary | ICD-10-CM | POA: Diagnosis not present

## 2017-04-04 DIAGNOSIS — B961 Klebsiella pneumoniae [K. pneumoniae] as the cause of diseases classified elsewhere: Secondary | ICD-10-CM | POA: Diagnosis present

## 2017-04-04 DIAGNOSIS — M1000 Idiopathic gout, unspecified site: Secondary | ICD-10-CM | POA: Diagnosis present

## 2017-04-04 DIAGNOSIS — I440 Atrioventricular block, first degree: Secondary | ICD-10-CM | POA: Diagnosis present

## 2017-04-04 DIAGNOSIS — I4510 Unspecified right bundle-branch block: Secondary | ICD-10-CM | POA: Diagnosis present

## 2017-04-04 DIAGNOSIS — T380X5A Adverse effect of glucocorticoids and synthetic analogues, initial encounter: Secondary | ICD-10-CM | POA: Diagnosis not present

## 2017-04-04 DIAGNOSIS — I10 Essential (primary) hypertension: Secondary | ICD-10-CM | POA: Diagnosis not present

## 2017-04-04 DIAGNOSIS — I5033 Acute on chronic diastolic (congestive) heart failure: Secondary | ICD-10-CM | POA: Diagnosis not present

## 2017-04-04 DIAGNOSIS — E119 Type 2 diabetes mellitus without complications: Secondary | ICD-10-CM | POA: Diagnosis not present

## 2017-04-04 DIAGNOSIS — D539 Nutritional anemia, unspecified: Secondary | ICD-10-CM | POA: Diagnosis present

## 2017-04-04 DIAGNOSIS — N183 Chronic kidney disease, stage 3 unspecified: Secondary | ICD-10-CM | POA: Diagnosis present

## 2017-04-04 DIAGNOSIS — E162 Hypoglycemia, unspecified: Secondary | ICD-10-CM

## 2017-04-04 DIAGNOSIS — N3000 Acute cystitis without hematuria: Secondary | ICD-10-CM | POA: Diagnosis not present

## 2017-04-04 DIAGNOSIS — R9431 Abnormal electrocardiogram [ECG] [EKG]: Secondary | ICD-10-CM | POA: Diagnosis not present

## 2017-04-04 DIAGNOSIS — Z7982 Long term (current) use of aspirin: Secondary | ICD-10-CM

## 2017-04-04 DIAGNOSIS — E11649 Type 2 diabetes mellitus with hypoglycemia without coma: Secondary | ICD-10-CM | POA: Diagnosis not present

## 2017-04-04 DIAGNOSIS — Z79899 Other long term (current) drug therapy: Secondary | ICD-10-CM

## 2017-04-04 DIAGNOSIS — E785 Hyperlipidemia, unspecified: Secondary | ICD-10-CM | POA: Diagnosis present

## 2017-04-04 DIAGNOSIS — Z9049 Acquired absence of other specified parts of digestive tract: Secondary | ICD-10-CM

## 2017-04-04 DIAGNOSIS — Z66 Do not resuscitate: Secondary | ICD-10-CM | POA: Diagnosis not present

## 2017-04-04 DIAGNOSIS — Z96643 Presence of artificial hip joint, bilateral: Secondary | ICD-10-CM | POA: Diagnosis present

## 2017-04-04 DIAGNOSIS — Z9842 Cataract extraction status, left eye: Secondary | ICD-10-CM

## 2017-04-04 DIAGNOSIS — I509 Heart failure, unspecified: Secondary | ICD-10-CM | POA: Diagnosis not present

## 2017-04-04 DIAGNOSIS — Z87440 Personal history of urinary (tract) infections: Secondary | ICD-10-CM

## 2017-04-04 DIAGNOSIS — Z9841 Cataract extraction status, right eye: Secondary | ICD-10-CM

## 2017-04-04 DIAGNOSIS — J810 Acute pulmonary edema: Secondary | ICD-10-CM | POA: Diagnosis not present

## 2017-04-04 DIAGNOSIS — J9601 Acute respiratory failure with hypoxia: Secondary | ICD-10-CM | POA: Diagnosis not present

## 2017-04-04 LAB — CBC WITH DIFFERENTIAL/PLATELET
Basophils Absolute: 0 10*3/uL (ref 0.0–0.1)
Basophils Relative: 0 %
Eosinophils Absolute: 0.1 10*3/uL (ref 0.0–0.7)
Eosinophils Relative: 1 %
HCT: 40 % (ref 36.0–46.0)
Hemoglobin: 11.9 g/dL — ABNORMAL LOW (ref 12.0–15.0)
Lymphocytes Relative: 21 %
Lymphs Abs: 2.3 10*3/uL (ref 0.7–4.0)
MCH: 30.4 pg (ref 26.0–34.0)
MCHC: 29.8 g/dL — ABNORMAL LOW (ref 30.0–36.0)
MCV: 102 fL — ABNORMAL HIGH (ref 78.0–100.0)
Monocytes Absolute: 1.3 10*3/uL — ABNORMAL HIGH (ref 0.1–1.0)
Monocytes Relative: 12 %
Neutro Abs: 7 10*3/uL (ref 1.7–7.7)
Neutrophils Relative %: 66 %
Platelets: 224 10*3/uL (ref 150–400)
RBC: 3.92 MIL/uL (ref 3.87–5.11)
RDW: 17 % — ABNORMAL HIGH (ref 11.5–15.5)
WBC: 10.6 10*3/uL — ABNORMAL HIGH (ref 4.0–10.5)

## 2017-04-04 LAB — URINALYSIS, ROUTINE W REFLEX MICROSCOPIC
BILIRUBIN URINE: NEGATIVE
Glucose, UA: NEGATIVE mg/dL
Ketones, ur: NEGATIVE mg/dL
NITRITE: POSITIVE — AB
PH: 5 (ref 5.0–8.0)
Protein, ur: NEGATIVE mg/dL
SPECIFIC GRAVITY, URINE: 1.006 (ref 1.005–1.030)

## 2017-04-04 LAB — COMPREHENSIVE METABOLIC PANEL
ALT: 31 U/L (ref 14–54)
AST: 57 U/L — ABNORMAL HIGH (ref 15–41)
Albumin: 3.1 g/dL — ABNORMAL LOW (ref 3.5–5.0)
Alkaline Phosphatase: 88 U/L (ref 38–126)
Anion gap: 11 (ref 5–15)
BUN: 32 mg/dL — ABNORMAL HIGH (ref 6–20)
CO2: 31 mmol/L (ref 22–32)
Calcium: 8.8 mg/dL — ABNORMAL LOW (ref 8.9–10.3)
Chloride: 98 mmol/L — ABNORMAL LOW (ref 101–111)
Creatinine, Ser: 1.61 mg/dL — ABNORMAL HIGH (ref 0.44–1.00)
GFR calc Af Amer: 33 mL/min — ABNORMAL LOW (ref >60)
GFR calc non Af Amer: 28 mL/min — ABNORMAL LOW (ref >60)
Glucose, Bld: 85 mg/dL (ref 65–99)
Potassium: 3.7 mmol/L (ref 3.5–5.1)
Sodium: 140 mmol/L (ref 135–145)
Total Bilirubin: 0.8 mg/dL (ref 0.3–1.2)
Total Protein: 6.8 g/dL (ref 6.5–8.1)

## 2017-04-04 LAB — BRAIN NATRIURETIC PEPTIDE: B Natriuretic Peptide: 215.4 pg/mL — ABNORMAL HIGH (ref 0.0–100.0)

## 2017-04-04 LAB — I-STAT TROPONIN, ED
TROPONIN I, POC: 0.02 ng/mL (ref 0.00–0.08)
Troponin i, poc: 0.02 ng/mL (ref 0.00–0.08)

## 2017-04-04 LAB — CBG MONITORING, ED
Glucose-Capillary: 23 mg/dL — CL (ref 65–99)
Glucose-Capillary: 76 mg/dL (ref 65–99)

## 2017-04-04 LAB — LIPASE, BLOOD: Lipase: 21 U/L (ref 11–51)

## 2017-04-04 LAB — I-STAT CG4 LACTIC ACID, ED
Lactic Acid, Venous: 1.85 mmol/L (ref 0.5–1.9)
Lactic Acid, Venous: 1.18 mmol/L (ref 0.5–1.9)

## 2017-04-04 LAB — PROTIME-INR
INR: 1.04
Prothrombin Time: 13.5 seconds (ref 11.4–15.2)

## 2017-04-04 MED ORDER — CARVEDILOL PHOSPHATE ER 80 MG PO CP24
80.0000 mg | ORAL_CAPSULE | Freq: Every day | ORAL | Status: DC
  Administered 2017-04-05 – 2017-04-11 (×7): 80 mg via ORAL
  Filled 2017-04-04 (×7): qty 1

## 2017-04-04 MED ORDER — EZETIMIBE-SIMVASTATIN 10-40 MG PO TABS
1.0000 | ORAL_TABLET | Freq: Every day | ORAL | Status: DC
  Administered 2017-04-05 – 2017-04-10 (×6): 1 via ORAL
  Filled 2017-04-04 (×7): qty 1

## 2017-04-04 MED ORDER — INSULIN ASPART 100 UNIT/ML ~~LOC~~ SOLN
0.0000 [IU] | SUBCUTANEOUS | Status: DC
  Administered 2017-04-05: 2 [IU] via SUBCUTANEOUS

## 2017-04-04 MED ORDER — ASPIRIN EC 81 MG PO TBEC
81.0000 mg | DELAYED_RELEASE_TABLET | Freq: Every day | ORAL | Status: DC
  Administered 2017-04-05 – 2017-04-11 (×7): 81 mg via ORAL
  Filled 2017-04-04 (×7): qty 1

## 2017-04-04 MED ORDER — IPRATROPIUM-ALBUTEROL 0.5-2.5 (3) MG/3ML IN SOLN
3.0000 mL | Freq: Once | RESPIRATORY_TRACT | Status: AC
  Administered 2017-04-04: 3 mL via RESPIRATORY_TRACT
  Filled 2017-04-04: qty 3

## 2017-04-04 MED ORDER — POTASSIUM CHLORIDE CRYS ER 20 MEQ PO TBCR
20.0000 meq | EXTENDED_RELEASE_TABLET | Freq: Every day | ORAL | Status: DC
  Administered 2017-04-05: 20 meq via ORAL
  Filled 2017-04-04 (×2): qty 1

## 2017-04-04 MED ORDER — SODIUM CHLORIDE 0.9% FLUSH
3.0000 mL | Freq: Two times a day (BID) | INTRAVENOUS | Status: DC
  Administered 2017-04-05 – 2017-04-06 (×4): 3 mL via INTRAVENOUS
  Administered 2017-04-07: 21:00:00 via INTRAVENOUS
  Administered 2017-04-07 – 2017-04-11 (×5): 3 mL via INTRAVENOUS

## 2017-04-04 MED ORDER — SODIUM CHLORIDE 0.9% FLUSH
3.0000 mL | INTRAVENOUS | Status: DC | PRN
  Administered 2017-04-04: 3 mL via INTRAVENOUS

## 2017-04-04 MED ORDER — DEXTROSE 50 % IV SOLN
1.0000 | Freq: Once | INTRAVENOUS | Status: AC
  Administered 2017-04-04: 50 mL via INTRAVENOUS

## 2017-04-04 MED ORDER — CALCIUM CARBONATE ANTACID 500 MG PO CHEW
1.5000 | CHEWABLE_TABLET | Freq: Every day | ORAL | Status: DC
  Administered 2017-04-05 – 2017-04-10 (×6): 300 mg via ORAL
  Filled 2017-04-04 (×7): qty 2

## 2017-04-04 MED ORDER — DEXTROSE 5 % IV SOLN
1.0000 g | INTRAVENOUS | Status: DC
  Administered 2017-04-05 – 2017-04-08 (×4): 1 g via INTRAVENOUS
  Filled 2017-04-04 (×4): qty 10

## 2017-04-04 MED ORDER — HEPARIN SODIUM (PORCINE) 5000 UNIT/ML IJ SOLN
5000.0000 [IU] | Freq: Three times a day (TID) | INTRAMUSCULAR | Status: DC
  Administered 2017-04-05 – 2017-04-11 (×18): 5000 [IU] via SUBCUTANEOUS
  Filled 2017-04-04 (×17): qty 1

## 2017-04-04 MED ORDER — MAGNESIUM OXIDE 400 (241.3 MG) MG PO TABS
400.0000 mg | ORAL_TABLET | Freq: Every day | ORAL | Status: DC
  Administered 2017-04-05 – 2017-04-11 (×7): 400 mg via ORAL
  Filled 2017-04-04 (×7): qty 1

## 2017-04-04 MED ORDER — DEXTROSE 50 % IV SOLN
INTRAVENOUS | Status: AC
Start: 2017-04-04 — End: 2017-04-05
  Filled 2017-04-04: qty 50

## 2017-04-04 MED ORDER — ONDANSETRON HCL 4 MG/2ML IJ SOLN: 4.0000 mg | Freq: Four times a day (QID) | INTRAMUSCULAR | Status: DC | PRN

## 2017-04-04 MED ORDER — FUROSEMIDE 10 MG/ML IJ SOLN
60.0000 mg | Freq: Two times a day (BID) | INTRAMUSCULAR | Status: DC
  Administered 2017-04-04 – 2017-04-06 (×4): 60 mg via INTRAVENOUS
  Filled 2017-04-04 (×4): qty 6

## 2017-04-04 MED ORDER — SODIUM CHLORIDE 0.9 % IV SOLN
250.0000 mL | INTRAVENOUS | Status: DC | PRN
Start: 2017-04-04 — End: 2017-04-11

## 2017-04-04 MED ORDER — DEXTROSE 50 % IV SOLN
INTRAVENOUS | Status: AC
  Filled 2017-04-04: qty 50

## 2017-04-04 MED ORDER — ALLOPURINOL 300 MG PO TABS
300.0000 mg | ORAL_TABLET | Freq: Every day | ORAL | Status: DC
  Administered 2017-04-06 – 2017-04-10 (×5): 300 mg via ORAL
  Filled 2017-04-04 (×6): qty 1

## 2017-04-04 MED ORDER — DEXTROMETHORPHAN POLISTIREX ER 30 MG/5ML PO SUER
60.0000 mg | Freq: Two times a day (BID) | ORAL | Status: DC | PRN
  Administered 2017-04-05: 60 mg via ORAL
  Filled 2017-04-04 (×2): qty 10

## 2017-04-04 MED ORDER — ACETAMINOPHEN 325 MG PO TABS: 650.0000 mg | ORAL_TABLET | ORAL | Status: DC | PRN

## 2017-04-04 MED ORDER — DEXTROSE 5 % IV SOLN
1.0000 g | Freq: Once | INTRAVENOUS | Status: AC
  Administered 2017-04-04: 1 g via INTRAVENOUS
  Filled 2017-04-04: qty 10

## 2017-04-04 MED ORDER — VITAMIN D 1000 UNITS PO TABS
3000.0000 [IU] | ORAL_TABLET | Freq: Every day | ORAL | Status: DC
  Administered 2017-04-05 – 2017-04-11 (×7): 3000 [IU] via ORAL
  Filled 2017-04-04 (×7): qty 3

## 2017-04-04 MED ORDER — FOLIC ACID 1 MG PO TABS
1.0000 mg | ORAL_TABLET | Freq: Every day | ORAL | Status: DC
  Administered 2017-04-05 – 2017-04-11 (×7): 1 mg via ORAL
  Filled 2017-04-04 (×7): qty 1

## 2017-04-04 MED ORDER — ALBUTEROL SULFATE (2.5 MG/3ML) 0.083% IN NEBU: 2.5000 mg | INHALATION_SOLUTION | RESPIRATORY_TRACT | Status: DC | PRN

## 2017-04-04 NOTE — ED Notes (Signed)
Recollected blood for bnp

## 2017-04-04 NOTE — ED Notes (Signed)
Attempted lab draw x 2 without success.  Phlebotomy at bedside.

## 2017-04-04 NOTE — ED Provider Notes (Signed)
[facility] EMERGENCY DEPARTMENT Provider Note   CSN: [id number] Arrival date & time: 04/04/17  1716     History   Chief Complaint Chief Complaint  Patient presents with  . Altered Mental Status    HPI Brandi Melton is a 83 y.o. female.  The history is provided by the patient, the EMS personnel, a relative and medical records. No language interpreter was used.  Altered Mental Status   This is a new problem. The current episode started 6 to 12 hours ago. The problem has not changed since onset.Associated symptoms include confusion. Pertinent negatives include no agitation. Her past medical history is significant for hypertension.  Shortness of Breath  Associated symptoms include cough and leg swelling (bilateral). Pertinent negatives include no fever, no headaches, no rhinorrhea, no neck pain, no wheezing, no chest pain, no vomiting, no abdominal pain and no rash. She has tried nothing for the symptoms. The treatment provided no relief.   LVL 5 caveat for AMS   Past Medical History:  Diagnosis Date  . Arthritis    "hips before they were replaced, right knee now" (11/09/2015)  . Chronic diastolic (congestive) heart failure (HCC)    a. echo 06/2015: EF 65-70%, Grade 1 DD, mitral valve calcification.  . CKD (chronic kidney disease), stage III ([place])   . Depression   . Dyspnea   . H/O cardiac catheterization    a. h/o cardiac catheterization 05/2013 in [place] showing only minimal CAD with increased filling pressure.  . Hyperlipemia   . Hypertensive heart disease   . Morbid obesity ([place])   . Palsy, Bell's   . RA (rheumatoid arthritis) ([place])   . Sleep apnea    "suppose to wear mask; couldn't" (11/09/2015)  . Type II diabetes mellitus [facility])     Patient Active Problem List   Diagnosis Date Noted  . CHF (congestive heart failure) ([place]) 02/23/2017  . Gout 02/19/2016  . Acute idiopathic gout   . Acute on chronic diastolic CHF (congestive heart failure) ([place])  02/18/2016  . H/O cardiac catheterization   . Chronic diastolic (congestive) heart failure ([place])   . Hypertensive heart disease   . Type II diabetes mellitus ([place])   . Acute respiratory failure with hypoxia ([place]) 11/07/2015  . CKD (chronic kidney disease), stage III ([place])   . Chronic diastolic CHF (congestive heart failure) ([place])   . Morbid obesity ([place])   . Acute on chronic diastolic heart failure ([place]) 07/05/2015  . Hypertension   . Hyperlipemia   . Sleep apnea   . Palsy, Bell's   . Diabetes mellitus type 2, insulin dependent ([place]) 08/10/2014  . Distal radius fracture 08/08/2014    Past Surgical History:  Procedure Laterality Date  . APPENDECTOMY  1966  . CARDIAC CATHETERIZATION  05/2013  . CARPAL TUNNEL RELEASE Right 08/08/2014   Procedure: OPEN CARPAL TUNNEL RELEASE;  Surgeon: Roseanne Kaufman, MD;  Location: [facility];  Service: Orthopedics;  Laterality: Right;  . CATARACT EXTRACTION W/ INTRAOCULAR LENS  IMPLANT, BILATERAL Bilateral   . CHOLECYSTECTOMY OPEN  1966  . COLONOSCOPY    . FRACTURE SURGERY    . JOINT REPLACEMENT    . KNEE ARTHROPLASTY Left   . ORIF WRIST FRACTURE Right 08/08/2014   Procedure: Right OPEN REDUCTION INTERNAL FIXATION (ORIF) WRIST FRACTURE WITH REPAIR AND RECONST/ALLOGRAFT AND BONE GRAFT;  Surgeon: Roseanne Kaufman, MD;  Location: [facility];  Service: Orthopedics;  Laterality: Right;  . TOTAL HIP ARTHROPLASTY Bilateral     OB  History    No data available       Home Medications    Prior to Admission medications   Medication Sig Start Date End Date Taking? Authorizing Provider  allopurinol (ZYLOPRIM) 300 MG tablet Take 300 mg by mouth at bedtime.  02/13/16   [provider]  aspirin EC 81 MG tablet Take 81 mg by mouth daily.    [provider]  calcium carbonate (TUMS EX) 750 MG chewable tablet Chew 1 tablet by mouth daily.    [provider]  carvedilol (COREG CR) 80 MG 24 hr capsule Take 1 capsule (80 mg total) by mouth daily.  07/07/16   Fay Records, MD  chlorhexidine (PERIDEX) 0.12 % solution Use as directed 5 mLs in the mouth or throat 2 (two) times daily.  01/17/15   [provider]  cholecalciferol (VITAMIN D) 1000 units tablet Take 3,000 Units by mouth daily.    [provider]  ezetimibe-simvastatin (VYTORIN) 10-40 MG tablet Take 1 tablet by mouth daily. 07/07/16   Fay Records, MD  folic acid (FOLVITE) 275 MCG tablet Take 400 mcg by mouth daily.    [provider]  insulin aspart (NOVOLOG) 100 UNIT/ML injection Inject 35 Units into the skin 4 (four) times daily.     [provider]  Insulin Detemir (LEVEMIR) 100 UNIT/ML Pen Inject 80 Units into the skin 2 (two) times daily.     [provider]  magnesium oxide (MAG-OX) 400 MG tablet Take 1 tablet (400 mg total) by mouth daily. 07/07/16   Fay Records, MD  potassium chloride SA (K-DUR,KLOR-CON) 20 MEQ tablet Take 1 tablet (20 mEq total) by mouth daily. 03/31/16   Richardson Dopp T, PA-C  torsemide (DEMADEX) 20 MG tablet Take 3 tablets (60 mg total) daily by mouth. 03/01/17   Lendon Colonel, NP  Vitamin D, Ergocalciferol, (DRISDOL) 50000 UNITS CAPS capsule Take 50,000 Units by mouth every Wednesday.     [provider]    Family History Family History  Problem Relation Age of Onset  . Sudden death Mother   . Stroke Mother   . Hypertension Neg Hx     Social History Social History   Tobacco Use  . Smoking status: Never Smoker  . Smokeless tobacco: Never Used  Substance Use Topics  . Alcohol use: Yes    Alcohol/week: 0.0 oz    Comment: 11/09/2015 "might have glass of champaign on special occasion"  . Drug use: No     Allergies   Patient has no known allergies.   Review of Systems Review of Systems  Unable to perform ROS: Mental status change  Constitutional: Positive for chills and fatigue. Negative for diaphoresis and fever.  HENT: Negative for congestion and rhinorrhea.   Eyes: Negative  for visual disturbance.  Respiratory: Positive for cough and shortness of breath. Negative for chest tightness and wheezing.   Cardiovascular: Positive for leg swelling (bilateral). Negative for chest pain and palpitations.  Gastrointestinal: Negative for abdominal pain, constipation, diarrhea, nausea and vomiting.  Endocrine: Positive for polyuria.  Genitourinary: Positive for frequency. Negative for dysuria.  Musculoskeletal: Negative for back pain, neck pain and neck stiffness.  Skin: Negative for rash and wound.  Neurological: Negative for light-headedness, numbness and headaches.  Psychiatric/Behavioral: Positive for confusion. Negative for agitation.     Physical Exam Updated Vital Signs BP (!) 113/57   Pulse 63   Temp 98.1 F (36.7 C) (Oral)   Resp (!) 23  SpO2 95%   Physical Exam  Constitutional: She appears well-developed and well-nourished. No distress.  HENT:  Head: Normocephalic.  Mouth/Throat: Oropharynx is clear and moist. No oropharyngeal exudate.  Eyes: Conjunctivae and EOM are normal. Pupils are equal, round, and reactive to light.  Neck: Normal range of motion.  Cardiovascular: Normal rate and intact distal pulses.  No murmur heard. Pulmonary/Chest: Effort normal. No stridor. Tachypnea noted. No respiratory distress. She has wheezes. She has rhonchi. She has rales. She exhibits no tenderness.  Abdominal: Soft. There is no tenderness. There is no rebound and no guarding.  Musculoskeletal: She exhibits edema. She exhibits no tenderness.       Right lower leg: She exhibits edema.       Left lower leg: She exhibits edema.  Neurological: She is disoriented. She displays no tremor. No cranial nerve deficit or sensory deficit. She exhibits normal muscle tone. Coordination normal. GCS eye subscore is 4. GCS verbal subscore is 5. GCS motor subscore is 6.  Skin: Capillary refill takes less than 2 seconds. She is not diaphoretic. No erythema.  Nursing note and vitals  reviewed.    ED Treatments / Results  Labs (all labs ordered are listed, but only abnormal results are displayed) Labs Reviewed  CBC WITH DIFFERENTIAL/PLATELET - Abnormal; Notable for the following components:      Result Value   WBC 10.6 (*)    Hemoglobin 11.9 (*)    MCV 102.0 (*)    MCHC 29.8 (*)    RDW 17.0 (*)    Monocytes Absolute 1.3 (*)    All other components within normal limits  COMPREHENSIVE METABOLIC PANEL - Abnormal; Notable for the following components:   Chloride 98 (*)    BUN 32 (*)    Creatinine, Ser 1.61 (*)    Calcium 8.8 (*)    Albumin 3.1 (*)    AST 57 (*)    GFR calc non Af Amer 28 (*)    GFR calc Af Amer 33 (*)    All other components within normal limits  URINALYSIS, ROUTINE W REFLEX MICROSCOPIC - Abnormal; Notable for the following components:   Hgb urine dipstick SMALL (*)    Nitrite POSITIVE (*)    Leukocytes, UA MODERATE (*)    Bacteria, UA RARE (*)    Squamous Epithelial / LPF 0-5 (*)    All other components within normal limits  BRAIN NATRIURETIC PEPTIDE - Abnormal; Notable for the following components:   B Natriuretic Peptide 215.4 (*)    All other components within normal limits  CBG MONITORING, ED - Abnormal; Notable for the following components:   Glucose-Capillary 23 (*)    All other components within normal limits  URINE CULTURE  CULTURE, BLOOD (ROUTINE X 2)  CULTURE, BLOOD (ROUTINE X 2)  LIPASE, BLOOD  PROTIME-INR  BASIC METABOLIC PANEL  VITAMIN S56  FOLATE RBC  I-STAT CG4 LACTIC ACID, ED  I-STAT TROPONIN, ED  CBG MONITORING, ED  I-STAT CG4 LACTIC ACID, ED  I-STAT TROPONIN, ED    EKG  EKG Interpretation  Date/Time:  Saturday April 04 2017 17:44:14 EST Ventricular Rate:  64 PR Interval:    QRS Duration: 170 QT Interval:  524 QTC Calculation: 541 R Axis:   -74 Text Interpretation:  Sinus rhythm Prolonged PR interval RBBB and LAFB Probable left ventricular hypertrophy When compared to prior, inverted t wave in lead  V1.  No STEMI Confirmed by Antony Blackbird [phone]) on 04/04/2017 6:01:12 PM  Radiology Dg Chest Portable 1 View  Result Date: 04/04/2017 CLINICAL DATA:  83 year old female with hypoxia, cough and wheezing EXAM: PORTABLE CHEST 1 VIEW COMPARISON:  Prior chest x-ray 02/24/2017 FINDINGS: Stable cardiac mediastinal contours. Atherosclerotic calcifications again noted in the transverse aorta. Stable bronchitic changes and interstitial prominence. No pulmonary edema, new airspace consolidation, pleural effusion or pneumothorax. No acute osseous abnormality. IMPRESSION: Stable chest x-ray without evidence of acute cardiopulmonary process. Electronically Signed   By: Jacqulynn Cadet M.D.   On: 04/04/2017 18:14    Procedures Procedures (including critical care time)  CRITICAL CARE Performed by: Gwenyth Allegra Kristen Bushway Total critical care time: 35 minutes Critical care time was exclusive of separately billable procedures and treating other patients. Critical care was necessary to treat or prevent imminent or life-threatening deterioration. Critical care was time spent personally by me on the following activities: development of treatment plan with patient and/or surrogate as well as nursing, discussions with consultants, evaluation of patient's response to treatment, examination of patient, obtaining history from patient or surrogate, ordering and performing treatments and interventions, ordering and review of laboratory studies, ordering and review of radiographic studies, pulse oximetry and re-evaluation of patient's condition.   Medications Ordered in ED Medications  aspirin EC tablet 81 mg (not administered)  allopurinol (ZYLOPRIM) tablet 300 mg (not administered)  calcium carbonate (TUMS EX) chewable tablet 750 mg (not administered)  carvedilol (COREG CR) 24 hr capsule 80 mg (not administered)  cholecalciferol (VITAMIN D) tablet 3,000 Units (not administered)  dextromethorphan (DELSYM) 30  MG/5ML liquid 60 mg (not administered)  ezetimibe-simvastatin (VYTORIN) 10-40 MG per tablet 1 tablet (not administered)  folic acid (FOLVITE) tablet 1 mg (not administered)  magnesium oxide (MAG-OX) tablet 400 mg (not administered)  potassium chloride SA (K-DUR,KLOR-CON) CR tablet 20 mEq (not administered)  sodium chloride flush (NS) 0.9 % injection 3 mL (not administered)  sodium chloride flush (NS) 0.9 % injection 3 mL (3 mLs Intravenous Given 04/04/17 2331)  0.9 %  sodium chloride infusion (not administered)  acetaminophen (TYLENOL) tablet 650 mg (not administered)  ondansetron (ZOFRAN) injection 4 mg (not administered)  insulin aspart (novoLOG) injection 0-9 Units (not administered)  heparin injection 5,000 Units (not administered)  furosemide (LASIX) injection 60 mg (60 mg Intravenous Given 04/04/17 2331)  cefTRIAXone (ROCEPHIN) 1 g in dextrose 5 % 50 mL IVPB (not administered)  albuterol (PROVENTIL) (2.5 MG/3ML) 0.083% nebulizer solution 2.5 mg (not administered)  dextrose 50 % solution 50 mL (50 mLs Intravenous Given 04/04/17 1743)  dextrose 50 % solution 50 mL (50 mLs Intravenous Given 04/04/17 1743)  ipratropium-albuterol (DUONEB) 0.5-2.5 (3) MG/3ML nebulizer solution 3 mL (3 mLs Nebulization Given 04/04/17 1832)  cefTRIAXone (ROCEPHIN) 1 g in dextrose 5 % 50 mL IVPB (0 g Intravenous Stopped 04/04/17 2321)     Initial Impression / Assessment and Plan / ED Course  I have reviewed the triage vital signs and the nursing notes.  Pertinent labs & imaging results that were available during my care of the patient were reviewed by me and considered in my medical decision making (see chart for details).     Brandi Melton is a 83 y.o. female with a past medical history of hypertension, diabetes, hyperlipidemia, CHF on Lasix therapy, and chronic kidney disease who presents for altered mental status, cough, hypoxia, peripheral edema, foul-smelling urine, and hypoglycemia.  Patient is  accompanied by family who reports that for the last week, patient has been feeling bad.  They report that she has been shortness  of breath and a cough.  Patient was started on azithromycin several days ago without significant improvement in symptoms.  Patient also was given breathing treatments.  They report that patient has had some gradually worsening peripheral edema and shortness of breath.  They report that patient has had worsening fatigue and has had some chills.  They deny patient has had fevers at home.  Reports the patient has had a cough that has been nonproductive.  Patient also has had foul-smelling urine and has had a history of urinary tract infections.  They report that they checked the patient's glucose this morning and it was in the 40s.  Patient was given a glucose tablet and EMS was called.  On arrival to the ED, glucose was 23 and patient was very confused.  On initial exam, patient was disoriented to place and time.  She was oriented to person.  Glucose was found to be in the 20s.  2 A of D50 were ordered with significant improvement in mental status subsequently.  Patient reports that she has been having a significant amount of fatigue and shortness of breath with peripheral edema.  She also had the cough and chills.  Patient's lungs had wheezing as well as coarse sounds and crackles.  Chest and abdomen were nontender.  Patient had no focal neurologic deficits on my  Subsequent exam.  Patient had pitting peripheral edema in both legs.  Patient had no tenderness on her back or the neck.  Normal pupil exam and normal extraocular movements.  No facial droop.  Patient subsequently was  oriented to place and time.  Suspect the altered mental status was due to the hypoglycemia however I am also concerned about fluid overload and possible infection given the urinary symptoms.  Patient will have lab testing and imaging to further evaluate for abnormalities.  Patient was found to be hypoxic in  the 80s on arrival.  Patient does not take oxygen at home.    Patient also given breathing treatment.  Patient will likely need admission due to the oxygen requirement regardless of findings however will reassess after diagnostic testing has returned.  Given significant improvement in symptoms after glucose, doubt patient has any intracranial abnormality causing her altered mental status.  After discussion with family, will hold on CT of the head given the improvement after glucose.  Diagnostic testing confirmed evidence of urinary tract infection.  Rocephin ordered.  BNP also slightly elevated.  Suspect slight fluid overload as etiology of patient's hypoxia as chest x-ray showed no pneumonia.   Patient will be admitted for altered mental status in the setting of UTI and hypoxia.    Final Clinical Impressions(s) / ED Diagnoses   Final diagnoses:  Altered mental status, unspecified altered mental status type  Hypoglycemia  Lower urinary tract infectious disease    Clinical Impression: 1. Altered mental status, unspecified altered mental status type   2. Hypoglycemia   3. Lower urinary tract infectious disease     Disposition: Admit  This note was prepared with assistance of Dragon voice recognition software. Occasional wrong-word or sound-a-like substitutions may have occurred due to the inherent limitations of voice recognition software.      Alveena Taira, Gwenyth Allegra, MD 04/05/17 [phone]

## 2017-04-04 NOTE — ED Notes (Signed)
Need blue top

## 2017-04-04 NOTE — H&P (Signed)
History and Physical    Brandi Melton YDX:[id number] DOB: 1930-07-30 DOA: 04/04/2017  PCP: Haywood Pao, MD   Patient coming from: Home  Chief Complaint: Confusion, lethargy, hypoglycemia   HPI: Brandi Melton is a 83 y.o. female with medical history significant for insulin-dependent diabetes mellitus, chronic kidney disease stage III, and chronic diastolic CHF, now presenting to the emergency department for evaluation of shortness of breath, swelling, confusion, and hypoglycemia.  She is accompanied by family who provide much of the history.  Patient had been doing fairly well until approximately 1 week ago when she developed shortness of breath, accompanied by progressive lower extremity edema bilaterally.  She has become more lethargic over this interval and was particularly lethargic today.  She was also quite confused, family checked her sugar, found it to be in the 82s.  She had been seen for these complaints by her PCP earlier in the course of the illness and treated with azithromycin, but with no appreciable improvement.  In fact, she has continued to worsen.  With her hypoglycemia, family administered glucose tabs and called EMS.  ED Course: Upon arrival to the ED, patient is found to be afebrile, requiring 3 L/min of supplemental oxygen in order to maintain saturations in the 90s, and with vitals otherwise stable.  EKG features a sinus rhythm with RBBB and first-degree AV nodal block.  Chest x-ray is stable.  Chemistry panel reveals a creatinine of 1.61, up from priors closer to 2.  CBC is notable for a leukocytosis to 10,600 and a stable anemia with hemoglobin of 11.9, and a new macrocytosis with MCV of 102.  Lactic acid is reassuringly normal and troponin is within normal limits x2.  BNP is elevated to 215.  Urinalysis is suggestive of infection.  Blood and urine cultures were collected, 50% dextrose was administered x2, and the patient was treated with empiric Rocephin in the ED.  She  continues to be tachypneic, saturating well on supplemental oxygen via nasal cannula, hemodynamically stable, and will be admitted to the telemetry unit for ongoing evaluation and shortness of breath with swelling and new oxygen requirement likely secondary to acute on chronic diastolic CHF, as well as confusion, hypoglycemia, and UTI.  Review of Systems:  Unable to complete ROS secondary to patient's clinical condition with acute encephalopathy.  Past Medical History:  Diagnosis Date  . Arthritis    "hips before they were replaced, right knee now" (11/09/2015)  . Chronic diastolic (congestive) heart failure (HCC)    a. echo 06/2015: EF 65-70%, Grade 1 DD, mitral valve calcification.  . CKD (chronic kidney disease), stage III ([place])   . Depression   . Dyspnea   . H/O cardiac catheterization    a. h/o cardiac catheterization 05/2013 in [place] showing only minimal CAD with increased filling pressure.  . Hyperlipemia   . Hypertensive heart disease   . Morbid obesity ([place])   . Palsy, Bell's   . RA (rheumatoid arthritis) ([place])   . Sleep apnea    "suppose to wear mask; couldn't" (11/09/2015)  . Type II diabetes mellitus ([place])     Past Surgical History:  Procedure Laterality Date  . APPENDECTOMY  1966  . CARDIAC CATHETERIZATION  05/2013  . CARPAL TUNNEL RELEASE Right 08/08/2014   Procedure: OPEN CARPAL TUNNEL RELEASE;  Surgeon: Roseanne Kaufman, MD;  Location: [facility];  Service: Orthopedics;  Laterality: Right;  . CATARACT EXTRACTION W/ INTRAOCULAR LENS  IMPLANT, BILATERAL Bilateral   . CHOLECYSTECTOMY OPEN  1966  .  COLONOSCOPY    . FRACTURE SURGERY    . JOINT REPLACEMENT    . KNEE ARTHROPLASTY Left   . ORIF WRIST FRACTURE Right 08/08/2014   Procedure: Right OPEN REDUCTION INTERNAL FIXATION (ORIF) WRIST FRACTURE WITH REPAIR AND RECONST/ALLOGRAFT AND BONE GRAFT;  Surgeon: Roseanne Kaufman, MD;  Location: [facility];  Service: Orthopedics;  Laterality: Right;  . TOTAL HIP ARTHROPLASTY Bilateral       reports that  has never smoked. she has never used smokeless tobacco. She reports that she drinks alcohol. She reports that she does not use drugs.  No Known Allergies  Family History  Problem Relation Age of Onset  . Sudden death Mother   . Stroke Mother   . Hypertension Neg Hx      Prior to Admission medications   Medication Sig Start Date End Date Taking? Authorizing Provider  allopurinol (ZYLOPRIM) 300 MG tablet Take 300 mg by mouth at bedtime.  02/13/16  Yes [provider]  aspirin EC 81 MG tablet Take 81 mg by mouth daily.   Yes [provider]  azithromycin (ZITHROMAX) 250 MG tablet Take 250-500 mg by mouth See admin instructions. Start date 04/03/17: take 2 tablets (500 mg) by mouth 1st day, then take 1 tablet (250 mg) daily on days 2-5 04/03/17  Yes [provider]  calcium carbonate (TUMS EX) 750 MG chewable tablet Chew 1 tablet by mouth daily.   Yes [provider]  carvedilol (COREG CR) 80 MG 24 hr capsule Take 1 capsule (80 mg total) by mouth daily. 07/07/16  Yes Fay Records, MD  chlorhexidine (PERIDEX) 0.12 % solution Use as directed 5 mLs in the mouth or throat daily. Swish and spit 01/17/15  Yes [provider]  cholecalciferol (VITAMIN D) 1000 units tablet Take 3,000 Units by mouth daily.   Yes [provider]  dextromethorphan (DELSYM) 30 MG/5ML liquid Take 60 mg by mouth 2 (two) times daily as needed for cough.   Yes [provider]  ezetimibe-simvastatin (VYTORIN) 10-40 MG tablet Take 1 tablet by mouth daily. Patient taking differently: Take 1 tablet by mouth at bedtime.  07/07/16  Yes Fay Records, MD  folic acid (FOLVITE) 295 MCG tablet Take 400 mcg by mouth daily.   Yes [provider]  glucose 4 GM chewable tablet Chew 1 tablet by mouth once as needed for low blood sugar.   Yes [provider]  insulin aspart (NOVOLOG FLEXPEN) 100 UNIT/ML FlexPen Inject 45 Units into the skin See  admin instructions. Inject 45 units subcutaneously two to three times daily with meals   Yes [provider]  Insulin Detemir (LEVEMIR FLEXTOUCH) 100 UNIT/ML Pen Inject 65 Units into the skin 2 (two) times daily.   Yes [provider]  magnesium oxide (MAG-OX) 400 MG tablet Take 1 tablet (400 mg total) by mouth daily. 07/07/16  Yes Fay Records, MD  metolazone (ZAROXOLYN) 2.5 MG tablet Take 2.5 mg by mouth See admin instructions. Take 1 tablet (2.5 mg) by mouth daily after torsemide for 3 days (start date 04/03/17 04/03/17  Yes [provider]  potassium chloride SA (K-DUR,KLOR-CON) 20 MEQ tablet Take 1 tablet (20 mEq total) by mouth daily. 03/31/16  Yes Richardson Dopp T, PA-C  torsemide (DEMADEX) 20 MG tablet Take 3 tablets (60 mg total) daily by mouth. 03/01/17  Yes Lendon Colonel, NP  Vitamin D, Ergocalciferol, (DRISDOL) 50000 UNITS CAPS capsule Take 50,000 Units by mouth every Wednesday.  Yes [provider]    Physical Exam: Vitals:   04/04/17 1815 04/04/17 1830 04/04/17 2015 04/04/17 2145  BP: 133/62 (!) 133/57 (!) 105/93 (!) 113/57  Pulse: 67 67 62 63  Resp: 15 (!) 21 16 (!) 23  Temp:      TempSrc:      SpO2: 98% 100% 97% 95%     Constitutional: NAD, calm, obese, tachypneic Eyes: PERTLA, lids and conjunctivae normal ENMT: Mucous membranes are moist. Posterior pharynx clear of any exudate or lesions.   Neck: normal, supple, no masses, no thyromegaly Respiratory: Tachypnea, rales at bases. No accessory muscle use.  Cardiovascular: S1 & S2 heard, regular rate and rhythm. 2+ pretibial edema bilaterally. Abdomen: No distension, no tenderness, no masses palpated. Bowel sounds normal.  Musculoskeletal: no clubbing / cyanosis. No joint deformity upper and lower extremities.  Skin: no significant rashes, lesions, ulcers. Warm, dry, well-perfused. Neurologic: No facial asymmetry. Sensation intact. Strength 5/5 in all 4 limbs.  Lethargic. Psychiatric:  Lethargic, roused easily, oriented to person and place only. Calm, cooperative.    Labs on Admission: I have personally reviewed following labs and imaging studies  CBC: Recent Labs  Lab 04/04/17 1755  WBC 10.6*  NEUTROABS 7.0  HGB 11.9*  HCT 40.0  MCV 102.0*  PLT 001   Basic Metabolic Panel: Recent Labs  Lab 04/04/17 1755  NA 140  K 3.7  CL 98*  CO2 31  GLUCOSE 85  BUN 32*  CREATININE 1.61*  CALCIUM 8.8*   GFR: CrCl cannot be calculated (Unknown ideal weight.). Liver Function Tests: Recent Labs  Lab 04/04/17 1755  AST 57*  ALT 31  ALKPHOS 88  BILITOT 0.8  PROT 6.8  ALBUMIN 3.1*   Recent Labs  Lab 04/04/17 1755  LIPASE 21   No results for input(s): AMMONIA in the last 168 hours. Coagulation Profile: Recent Labs  Lab 04/04/17 1755  INR 1.04   Cardiac Enzymes: No results for input(s): CKTOTAL, CKMB, CKMBINDEX, TROPONINI in the last 168 hours. BNP (last 3 results) Recent Labs    07/07/16 1012 12/08/16 1453 01/05/17 1625  PROBNP 146 231 133   HbA1C: No results for input(s): HGBA1C in the last 72 hours. CBG: Recent Labs  Lab 04/04/17 1727 04/04/17 1919  GLUCAP 23* 76   Lipid Profile: No results for input(s): CHOL, HDL, LDLCALC, TRIG, CHOLHDL, LDLDIRECT in the last 72 hours. Thyroid Function Tests: No results for input(s): TSH, T4TOTAL, FREET4, T3FREE, THYROIDAB in the last 72 hours. Anemia Panel: No results for input(s): VITAMINB12, FOLATE, FERRITIN, TIBC, IRON, RETICCTPCT in the last 72 hours. Urine analysis:    Component Value Date/Time   COLORURINE YELLOW 04/04/2017 2100   APPEARANCEUR CLEAR 04/04/2017 2100   APPEARANCEUR Cloudy (A) 08/12/2016 1312   LABSPEC 1.006 04/04/2017 2100   PHURINE 5.0 04/04/2017 2100   GLUCOSEU NEGATIVE 04/04/2017 2100   HGBUR SMALL (A) 04/04/2017 2100   BILIRUBINUR NEGATIVE 04/04/2017 2100   BILIRUBINUR Negative 08/12/2016 [place] 04/04/2017 2100   PROTEINUR  NEGATIVE 04/04/2017 2100   NITRITE POSITIVE (A) 04/04/2017 2100   LEUKOCYTESUR MODERATE (A) 04/04/2017 2100   LEUKOCYTESUR 3+ (A) 08/12/2016 1312   Sepsis Labs: @LABRCNTIP (procalcitonin:4,lacticidven:4) )No results found for this or any previous visit (from the past 240 hour(s)).   Radiological Exams on Admission: Dg Chest Portable 1 View  Result Date: 04/04/2017 CLINICAL DATA:  83 year old female with hypoxia, cough and wheezing EXAM: PORTABLE CHEST 1 VIEW COMPARISON:  Prior chest x-ray 02/24/2017 FINDINGS: Stable  cardiac mediastinal contours. Atherosclerotic calcifications again noted in the transverse aorta. Stable bronchitic changes and interstitial prominence. No pulmonary edema, new airspace consolidation, pleural effusion or pneumothorax. No acute osseous abnormality. IMPRESSION: Stable chest x-ray without evidence of acute cardiopulmonary process. Electronically Signed   By: Jacqulynn Cadet M.D.   On: 04/04/2017 18:14    EKG: Independently reviewed. Sinus rhythm, RBBB, 1st degree AV block.   Assessment/Plan  1. Acute encephalopathy  - Pt presents from home with confusion and lethargy  - She is hypoglycemic with acute UTI and hypoxia on rm air  - No focal deficits on exam  - Plan to correct sugars, treat infection, diurese, and check B12 and folate given new macrocytosis   2. Hypoglycemia; insulin-dependent DM  - CBG was 40 at home, treated with oral glucose  - CBG 23 on arrival to ED and treated with 50% dextrose  - Likely secondary to insulin, which pt manages herself, less-likely from the acute infection - A1c was 7.2% a year ago, managed at home with Levemir 65 units BID and Novolog 45 units 2-3x daily  - Plan to follow sugars closely, use low-intensity SSI only for now, resume long-acting once sugars improve  3. Acute UTI  - Pt presents with acute encephalopathy  - UA suggests infection and sample sent for culture; mild leukocytosis noted  - Treated with empiric  Rocephin in ED, will continue pending culture data  4. Acute on chronic CHF  - Pt presents with SOB, increased edema, new supplemental O2-requirement  - Echo from March 2017 with preserved EF, grade 1 diastolic dysfunction  - BNP is elevated, peripheral edema and rales noted, now requiring 3 Lpm supplemental O2  - Managed at home with Torsemide 60 mg daily plus Zaroxolyn  - Continue cardiac monitoring, SLIV, follow strict I/O's and daily wts, fluid-restrict diet, diurese with Lasix 60 mg IV q12h, continue Coreg as tolerated, update echo    5. CKD stage III  - SCr is 1.61 on admission, better than priors closer to 2  - She is being diuresed; will follow daily chem panel during diuresis    DVT prophylaxis: sq heparin  Code Status: Full  Family Communication: Family updated at bedside Disposition Plan: Admit to telemetry Consults called: None Admission status: Inpatient    Vianne Bulls, MD [facility] Pager [phone]  If 7PM-7AM, please contact night-coverage [email or web address] Password [facility]  04/04/2017, 10:22 PM

## 2017-04-04 NOTE — ED Triage Notes (Signed)
Pt brought in by family. She has been having increased fluid retention, coughing, uncontrolled blood sugars (hypoglycemic) last cbg at home was 44 and they gave a glucose tablet. Pt smells of urine in triage. Pt very lethargic in triage and not following any commands.

## 2017-04-05 ENCOUNTER — Inpatient Hospital Stay (HOSPITAL_COMMUNITY): Payer: Medicare Other

## 2017-04-05 ENCOUNTER — Other Ambulatory Visit: Payer: Self-pay

## 2017-04-05 DIAGNOSIS — I340 Nonrheumatic mitral (valve) insufficiency: Secondary | ICD-10-CM

## 2017-04-05 LAB — BASIC METABOLIC PANEL
ANION GAP: 7 (ref 5–15)
BUN: 32 mg/dL — AB (ref 6–20)
CHLORIDE: 101 mmol/L (ref 101–111)
CO2: 33 mmol/L — ABNORMAL HIGH (ref 22–32)
Calcium: 8.3 mg/dL — ABNORMAL LOW (ref 8.9–10.3)
Creatinine, Ser: 1.65 mg/dL — ABNORMAL HIGH (ref 0.44–1.00)
GFR calc Af Amer: 32 mL/min — ABNORMAL LOW (ref >60)
GFR calc non Af Amer: 27 mL/min — ABNORMAL LOW (ref >60)
Glucose, Bld: 106 mg/dL — ABNORMAL HIGH (ref 65–99)
POTASSIUM: 3.7 mmol/L (ref 3.5–5.1)
SODIUM: 141 mmol/L (ref 135–145)

## 2017-04-05 LAB — GLUCOSE, CAPILLARY
GLUCOSE-CAPILLARY: 132 mg/dL — AB (ref 65–99)
GLUCOSE-CAPILLARY: 147 mg/dL — AB (ref 65–99)
GLUCOSE-CAPILLARY: 174 mg/dL — AB (ref 65–99)
GLUCOSE-CAPILLARY: 87 mg/dL (ref 65–99)
GLUCOSE-CAPILLARY: 95 mg/dL (ref 65–99)
Glucose-Capillary: 31 mg/dL — CL (ref 65–99)
Glucose-Capillary: 135 mg/dL — ABNORMAL HIGH (ref 65–99)
Glucose-Capillary: 161 mg/dL — ABNORMAL HIGH (ref 65–99)

## 2017-04-05 LAB — ECHOCARDIOGRAM COMPLETE
E decel time: 250 msec
EERAT: 13.86
FS: 40 % (ref 28–44)
IV/PV OW: 0.99
LA diam end sys: 44 mm
LADIAMINDEX: 1.99 cm/m2
LASIZE: 44 mm
LAVOL: 60.1 mL
LAVOLA4C: 66.8 mL
LAVOLIN: 27.2 mL/m2
LDCA: 3.14 cm2
LV E/e' medial: 13.86
LV TDI E'MEDIAL: 6.74
LV e' LATERAL: 7.72 cm/s
LVEEAVG: 13.86
LVOT SV: 80 mL
LVOT VTI: 25.4 cm
LVOT diameter: 20 mm
LVOT peak vel: 101 cm/s
Lateral S' vel: 12.9 cm/s
MV Dec: 250
MVPG: 5 mmHg
MVPKAVEL: 152 m/s
MVPKEVEL: 107 m/s
PW: 11.3 mm — AB (ref 0.6–1.1)
RV TAPSE: 22.2 mm
TDI e' lateral: 7.72
WEIGHTICAEL: 3689.62 [oz_av]

## 2017-04-05 LAB — VITAMIN B12: Vitamin B-12: 444 pg/mL (ref 180–914)

## 2017-04-05 MED ORDER — INSULIN ASPART 100 UNIT/ML ~~LOC~~ SOLN
0.0000 [IU] | Freq: Three times a day (TID) | SUBCUTANEOUS | Status: DC
  Administered 2017-04-05: 1 [IU] via SUBCUTANEOUS
  Administered 2017-04-06: 3 [IU] via SUBCUTANEOUS
  Administered 2017-04-06: 1 [IU] via SUBCUTANEOUS

## 2017-04-05 MED ORDER — DEXTROSE 50 % IV SOLN
INTRAVENOUS | Status: AC
  Administered 2017-04-05: 50 mL
  Filled 2017-04-05: qty 50

## 2017-04-05 MED ORDER — IPRATROPIUM-ALBUTEROL 0.5-2.5 (3) MG/3ML IN SOLN
3.0000 mL | Freq: Four times a day (QID) | RESPIRATORY_TRACT | Status: DC
  Administered 2017-04-05: 3 mL via RESPIRATORY_TRACT
  Filled 2017-04-05: qty 3

## 2017-04-05 MED ORDER — IPRATROPIUM-ALBUTEROL 0.5-2.5 (3) MG/3ML IN SOLN
3.0000 mL | Freq: Two times a day (BID) | RESPIRATORY_TRACT | Status: DC
  Administered 2017-04-05 – 2017-04-07 (×3): 3 mL via RESPIRATORY_TRACT
  Filled 2017-04-05 (×4): qty 3

## 2017-04-05 NOTE — Progress Notes (Addendum)
PROGRESS NOTE   Brandi Melton  SWN:[id number]    DOB: Apr 01, 1931    DOA: 04/04/2017  PCP: Haywood Pao, MD   I have briefly reviewed patients previous medical records in [facility].  Brief Narrative:  83 year old female with PMH of IDDM, stage III chronic kidney disease, chronic diastolic CHF, HTN, HLD, morbid obesity, RA, presented to ED with complaints of progressive worsening dyspnea, lower extremity edema, lethargy, confusion, CBG in the 40s on day of admission. Recently seen by PCP and treated with azithromycin without improvement. Admitted for acute hypoxic respiratory failure due to acute on chronic diastolic CHF, confusion, hypoglycemia and acute cystitis.   Assessment & Plan:   Principal Problem:   Acute lower UTI Active Problems:   Diabetes mellitus type 2, insulin dependent (HCC)   Hypertension   CKD (chronic kidney disease), stage III (HCC)   Hypoglycemia due to insulin   Acute on chronic diastolic CHF (congestive heart failure) (HCC)   Acute encephalopathy   Hypoxia   1. Acute toxic metabolic encephalopathy: Likely related to hypoglycemia at home, acute cystitis and hypoxemia. No focal deficits. Improved or even resolved. Monitor. 2. IDDM with hypoglycemia: CBG was 40 at home & 23 on arrival to ED where it was treated with 50% D5. Likely secondary to insulin and poor oral intake as confirmed with patient. Levemir held. Currently only on SSI. No further hypoglycemia but continue to monitor closely. Encouraged diet. A1c 7.2 on 02/19/16. 3. Acute cystitis: Symptomatic of dysuria. Treating empirically with IV ceftriaxone pending urine culture results. Blood cultures 2: Negative to date. 4. Acute on chronic diastolic CHF: She was symptomatic off dyspnea, leg edema, hypoxia, peripheral edema, elevated BNP. 2-D echo 12/23: LVEF 55-60 percent and grade 1 diastolic dysfunction. Continue IV Lasix 60 mg twice a day for another day and then reassess regarding changing to oral  Lasix. -870 ML since admission but may not be fully accurate. 5. Acute hypoxic respiratory failure: Due to decompensated CHF complicating underlying OSA. Treat underlying cause and wean off oxygen as tolerated. 6. Stage III chronic kidney disease: Baseline creatinine may be in the 1.9 range. Presented with creatinine of 1.6. Stable. Follow BMP closely while on diuretics. 7. Essential hypertension: Controlled. 8. Hyperlipidemia: Continue home medications. 9. Morbid obesity/Body mass index is 40.85 kg/m. 10. Macrocytic anemia: B12: 444. Outpatient follow-up.    DVT prophylaxis:  Subcutaneous heparin Code Status:  Full Family Communication:  None at bedside Disposition:  DC home when medically improved. PT evaluation requested.   Consultants:  None   Procedures:  None  Antimicrobials:  IV ceftriaxone    Subjective:  Feels better. Dyspnea improved. Cough with intermittent white sputum. Dysuria present but no fever. Reports recent move to a new apartment because there was water leak in her apartment from dysfunctional [facility] unit.   ROS: No chest pain, dizziness, lightheadedness or palpitations.  Objective:  Vitals:   04/05/17 0436 04/05/17 0803 04/05/17 0954 04/05/17 1220  BP: (!) 102/50 128/60  (!) 113/46  Pulse: 70 70  68  Resp: 20 20  20   Temp: 98.1 F (36.7 C) 98.2 F (36.8 C)  98.1 F (36.7 C)  TempSrc: Axillary Oral  Oral  SpO2: 94% 97% 96% 98%  Weight:        Examination:  General exam: elderly female, moderately built and obese, lying comfortably propped up in bed. Respiratory system: reduced breath sounds bilaterally with scattered few expiratory rhonchi and few basal crackles. Respiratory effort normal. Cardiovascular system:  S1 & S2 heard, RRR. No JVD, murmurs, rubs, gallops or clicks. Trace bilateral leg edema. Telemetry personally reviewed: SR with BBB morphology and occasional PVCs.  Gastrointestinal system: Abdomen is nondistended, soft and nontender. No  organomegaly or masses felt. Normal bowel sounds heard. Central nervous system: Alert and oriented. No focal neurological deficits. Extremities: Symmetric 5 x 5 power. Skin: No rashes, lesions or ulcers Psychiatry: Judgement and insight appear normal. Mood & affect appropriate.     Data Reviewed: I have personally reviewed following labs and imaging studies  CBC: Recent Labs  Lab 04/04/17 1755  WBC 10.6*  NEUTROABS 7.0  HGB 11.9*  HCT 40.0  MCV 102.0*  PLT 361   Basic Metabolic Panel: Recent Labs  Lab 04/04/17 1755 04/05/17 0455  NA 140 141  K 3.7 3.7  CL 98* 101  CO2 31 33*  GLUCOSE 85 106*  BUN 32* 32*  CREATININE 1.61* 1.65*  CALCIUM 8.8* 8.3*   Liver Function Tests: Recent Labs  Lab 04/04/17 1755  AST 57*  ALT 31  ALKPHOS 88  BILITOT 0.8  PROT 6.8  ALBUMIN 3.1*   Coagulation Profile: Recent Labs  Lab 04/04/17 1755  INR 1.04   CBG: Recent Labs  Lab 04/05/17 0203 04/05/17 0312 04/05/17 0602 04/05/17 0719 04/05/17 1137  GLUCAP 31* 135* 95 87 161*    Recent Results (from the past 240 hour(s))  Blood culture (routine x 2)     Status: None (Preliminary result)   Collection Time: 04/04/17  6:48 PM  Result Value Ref Range Status   Specimen Description BLOOD BLOOD LEFT FOREARM  Final   Special Requests AEROBIC BOTTLE ONLY Blood Culture adequate volume  Final   Culture NO GROWTH < 24 HOURS  Final   Report Status PENDING  Incomplete  Blood culture (routine x 2)     Status: None (Preliminary result)   Collection Time: 04/04/17  7:00 PM  Result Value Ref Range Status   Specimen Description BLOOD BLOOD LEFT WRIST  Final   Special Requests AEROBIC BOTTLE ONLY Blood Culture adequate volume  Final   Culture NO GROWTH < 24 HOURS  Final   Report Status PENDING  Incomplete         Radiology Studies: Dg Chest Portable 1 View  Result Date: 04/04/2017 CLINICAL DATA:  83 year old female with hypoxia, cough and wheezing EXAM: PORTABLE CHEST 1 VIEW  COMPARISON:  Prior chest x-ray 02/24/2017 FINDINGS: Stable cardiac mediastinal contours. Atherosclerotic calcifications again noted in the transverse aorta. Stable bronchitic changes and interstitial prominence. No pulmonary edema, new airspace consolidation, pleural effusion or pneumothorax. No acute osseous abnormality. IMPRESSION: Stable chest x-ray without evidence of acute cardiopulmonary process. Electronically Signed   By: Jacqulynn Cadet M.D.   On: 04/04/2017 18:14        Scheduled Meds: . allopurinol  300 mg Oral QHS  . aspirin EC  81 mg Oral Daily  . calcium carbonate  1.5 tablet Oral QAC supper  . carvedilol  80 mg Oral Daily  . cholecalciferol  3,000 Units Oral Daily  . ezetimibe-simvastatin  1 tablet Oral q1800  . folic acid  1 mg Oral Daily  . furosemide  60 mg Intravenous BID  . heparin  5,000 Units Subcutaneous Q8H  . insulin aspart  0-9 Units Subcutaneous Q4H  . ipratropium-albuterol  3 mL Nebulization BID  . magnesium oxide  400 mg Oral Daily  . potassium chloride SA  20 mEq Oral Daily  . sodium chloride flush  3  mL Intravenous Q12H   Continuous Infusions: . sodium chloride    . cefTRIAXone (ROCEPHIN)  IV       LOS: 1 day     Vernell Leep, MD, FACP, [facility]. [facility] Pager [phone]  If 7PM-7AM, please contact night-coverage [email or web address] Password [facility] 04/05/2017, 3:56 PM

## 2017-04-05 NOTE — Accreditation Note (Signed)
Admitted pt from ed arousable confused and sleepy. VS taen tele on.fall prec instituted.unable to complete history , pt confused

## 2017-04-05 NOTE — Progress Notes (Signed)
  Echocardiogram 2D Echocardiogram has been performed.  Brandi Melton 04/05/2017, 11:05 AM

## 2017-04-05 NOTE — Progress Notes (Signed)
Instructed pt on use of flutter valve q1h w/a/. Left flutter at bedside with pt.

## 2017-04-05 NOTE — Progress Notes (Signed)
Pt is more alert and oriented this morning, getting up and using BSC, tolerating medicine, diuresing well, no any complain of chest pain, SOB and distress, taught to use flutter valve, still wheezing some, getting breathing treatment PRN, will continue to monitor the patient  Palma Holter, RN

## 2017-04-06 ENCOUNTER — Inpatient Hospital Stay (HOSPITAL_COMMUNITY): Payer: Medicare Other

## 2017-04-06 DIAGNOSIS — J9601 Acute respiratory failure with hypoxia: Secondary | ICD-10-CM

## 2017-04-06 DIAGNOSIS — J209 Acute bronchitis, unspecified: Secondary | ICD-10-CM

## 2017-04-06 LAB — BASIC METABOLIC PANEL
Anion gap: 8 (ref 5–15)
BUN: 35 mg/dL — ABNORMAL HIGH (ref 6–20)
CHLORIDE: 96 mmol/L — AB (ref 101–111)
CO2: 36 mmol/L — ABNORMAL HIGH (ref 22–32)
Calcium: 8.7 mg/dL — ABNORMAL LOW (ref 8.9–10.3)
Creatinine, Ser: 1.72 mg/dL — ABNORMAL HIGH (ref 0.44–1.00)
GFR calc non Af Amer: 26 mL/min — ABNORMAL LOW (ref >60)
GFR, EST AFRICAN AMERICAN: 30 mL/min — AB (ref >60)
Glucose, Bld: 160 mg/dL — ABNORMAL HIGH (ref 65–99)
POTASSIUM: 3.3 mmol/L — AB (ref 3.5–5.1)
SODIUM: 140 mmol/L (ref 135–145)

## 2017-04-06 LAB — GLUCOSE, CAPILLARY
GLUCOSE-CAPILLARY: 168 mg/dL — AB (ref 65–99)
GLUCOSE-CAPILLARY: 215 mg/dL — AB (ref 65–99)
Glucose-Capillary: 195 mg/dL — ABNORMAL HIGH (ref 65–99)
Glucose-Capillary: 267 mg/dL — ABNORMAL HIGH (ref 65–99)

## 2017-04-06 LAB — FOLATE RBC
Folate, Hemolysate: >620 ng/mL
HEMATOCRIT: 33.2 % — AB (ref 34.0–46.6)

## 2017-04-06 MED ORDER — INSULIN DETEMIR 100 UNIT/ML ~~LOC~~ SOLN
10.0000 [IU] | Freq: Two times a day (BID) | SUBCUTANEOUS | Status: DC
  Administered 2017-04-06 – 2017-04-07 (×3): 10 [IU] via SUBCUTANEOUS
  Filled 2017-04-06 (×3): qty 0.1

## 2017-04-06 MED ORDER — POTASSIUM CHLORIDE CRYS ER 20 MEQ PO TBCR
40.0000 meq | EXTENDED_RELEASE_TABLET | Freq: Every day | ORAL | Status: DC
  Administered 2017-04-06 – 2017-04-11 (×6): 40 meq via ORAL
  Filled 2017-04-06 (×6): qty 2

## 2017-04-06 MED ORDER — INSULIN ASPART 100 UNIT/ML ~~LOC~~ SOLN
0.0000 [IU] | Freq: Every day | SUBCUTANEOUS | Status: DC
  Administered 2017-04-06: 3 [IU] via SUBCUTANEOUS

## 2017-04-06 MED ORDER — BENZONATATE 100 MG PO CAPS: 200.0000 mg | ORAL_CAPSULE | Freq: Three times a day (TID) | ORAL | Status: DC | PRN

## 2017-04-06 MED ORDER — INSULIN DETEMIR 100 UNIT/ML FLEXPEN: 10.0000 [IU] | PEN_INJECTOR | Freq: Two times a day (BID) | SUBCUTANEOUS | Status: DC

## 2017-04-06 MED ORDER — INSULIN ASPART 100 UNIT/ML ~~LOC~~ SOLN
0.0000 [IU] | Freq: Three times a day (TID) | SUBCUTANEOUS | Status: DC
  Administered 2017-04-06: 2 [IU] via SUBCUTANEOUS

## 2017-04-06 NOTE — Progress Notes (Signed)
Tried weaning pt off oxygen per MD order, O2 sat drop to 87% 0n RA, pt back on 02 at 1L.

## 2017-04-06 NOTE — Progress Notes (Signed)
PROGRESS NOTE   Brandi Melton  XTG:[id number]    DOB: 09-Dec-1930    DOA: 04/04/2017  PCP: Haywood Pao, MD   I have briefly reviewed patients previous medical records in [facility].  Brief Narrative:  83 year old female with PMH of IDDM, stage III chronic kidney disease, chronic diastolic CHF, HTN, HLD, morbid obesity, RA, presented to ED with complaints of progressive worsening dyspnea, lower extremity edema, lethargy, confusion, CBG in the 40s on day of admission. Recently seen by PCP and treated with azithromycin without improvement. Admitted for acute hypoxic respiratory failure due to acute on chronic diastolic CHF, confusion, hypoglycemia and acute cystitis.   Assessment & Plan:   Principal Problem:   Acute lower UTI Active Problems:   Diabetes mellitus type 2, insulin dependent (HCC)   Hypertension   CKD (chronic kidney disease), stage III (HCC)   Hypoglycemia due to insulin   Acute on chronic diastolic CHF (congestive heart failure) (HCC)   Acute encephalopathy   Hypoxia   1. Acute toxic metabolic encephalopathy: Likely related to hypoglycemia at home, acute cystitis and hypoxemia. No focal deficits. Seems to have resolved. 2. IDDM with hypoglycemia: CBG was 40 at home & 23 on arrival to ED where it was treated with 50% D5. Likely secondary to insulin and poor oral intake as confirmed with patient. Levemir held. Currently only on SSI. No further hypoglycemia but continue to monitor closely. Encouraged diet. A1c 7.2 on 02/19/16. CBG starting to creep up. Resume reduced dose of Levemir 10 units twice a day. Adjust as needed. 3. Acute cystitis: Symptomatic of dysuria. Treating empirically with IV ceftriaxone pending urine culture results. Blood cultures 2: Negative to date. Urine culture shows >100 K colonies per mL gram-negative rods. Follow final results. 4. Acute on chronic diastolic CHF: She was symptomatic off dyspnea, leg edema, hypoxia, peripheral edema, elevated  BNP. 2-D echo 12/23: LVEF 55-60 percent and grade 1 diastolic dysfunction. Treated with IV Lasix 60 mg twice a day. - 60 ML since admission but may not be fully accurate. Volume status looks much improved clinically and radiologically. DC IV Lasix (received 60 mg this morning) and consider starting home dose of diuretics tomorrow. 5. Acute hypoxic respiratory failure: Due to decompensated CHF complicating underlying OSA and possible acute viral bronchitis. Treat underlying cause and wean off oxygen as tolerated. Chest x-ray personally reviewed, left base atelectasis (less likely pneumonia clinically). Incentive spirometry. Was hypoxic with room air at rest, 83% on 12/24. 6. Stage III chronic kidney disease: Baseline creatinine may be in the 1.9 range. Presented with creatinine of 1.6. Stable. Follow BMP closely while on diuretics. 7. Essential hypertension: Controlled. 8. Hyperlipidemia: Continue home medications. 9. Morbid obesity/Body mass index is 41.2 kg/m. 10. Macrocytic anemia: B12: 444. Outpatient follow-up. 11. Possible acute viral bronchitis: Symptoms preceded admission by about a week. Supportive treatment. Add Tessalon. 12. Hypokalemia: Replace and follow.    DVT prophylaxis:  Subcutaneous heparin Code Status:  Full Family Communication:  None at bedside Disposition:  DC home when medically improved. PT recommending home health PT at discharge.   Consultants:  None   Procedures:  None  Antimicrobials:  IV ceftriaxone    Subjective: Reports feeling ill a week ago when she had dry cough, chills and runny nose. Overall feeling much better today compared to yesterday with improved dyspnea, still has mild intermittent dry cough. Dysuria improving. No chest pain.  ROS: No chest pain, dizziness, lightheadedness or palpitations.  Objective:  Vitals:  04/06/17 0019 04/06/17 0618 04/06/17 0732 04/06/17 0855  BP: (!) 120/44 (!) 125/45  (!) 126/52  Pulse: 71 71 71 67  Resp:  20 20 18    Temp: 98.3 F (36.8 C) (!) 97.5 F (36.4 C)  98.5 F (36.9 C)  TempSrc: Oral Oral  Oral  SpO2: 96% 96% 92% 97%  Weight:  105.5 kg (232 lb 9.4 oz)      Examination:  General exam: elderly female, moderately built and obese, sitting up comfortably in chair this morning.  Respiratory system: slightly diminished breath sounds in the bases with occasional basal crackles. No wheezing or rhonchi heard. Respiratory effort normal. Cardiovascular system: S1 & S2 heard, RRR. No JVD, murmurs, rubs, gallops or clicks. No leg edema. Telemetry personally reviewed: SR with BBB morphology and occasional PVCs.  Stable without change.  Gastrointestinal system: Abdomen is nondistended, soft and nontender. No organomegaly or masses felt. Normal bowel sounds heard. Stable without change.  Central nervous system: Alert and oriented. No focal neurological deficits. Stable without change.  Extremities: Symmetric 5 x 5 power. Skin: No rashes, lesions or ulcers Psychiatry: Judgement and insight appear normal. Mood & affect appropriate.     Data Reviewed: I have personally reviewed following labs and imaging studies  CBC: Recent Labs  Lab 04/04/17 1755  WBC 10.6*  NEUTROABS 7.0  HGB 11.9*  HCT 40.0  MCV 102.0*  PLT 633   Basic Metabolic Panel: Recent Labs  Lab 04/04/17 1755 04/05/17 0455 04/06/17 0546  NA 140 141 140  K 3.7 3.7 3.3*  CL 98* 101 96*  CO2 31 33* 36*  GLUCOSE 85 106* 160*  BUN 32* 32* 35*  CREATININE 1.61* 1.65* 1.72*  CALCIUM 8.8* 8.3* 8.7*   Liver Function Tests: Recent Labs  Lab 04/04/17 1755  AST 57*  ALT 31  ALKPHOS 88  BILITOT 0.8  PROT 6.8  ALBUMIN 3.1*   Coagulation Profile: Recent Labs  Lab 04/04/17 1755  INR 1.04   CBG: Recent Labs  Lab 04/05/17 1641 04/05/17 2035 04/05/17 2353 04/06/17 0621 04/06/17 1112  GLUCAP 132* 147* 174* 168* 215*    Recent Results (from the past 240 hour(s))  Blood culture (routine x 2)     Status: None  (Preliminary result)   Collection Time: 04/04/17  6:48 PM  Result Value Ref Range Status   Specimen Description BLOOD BLOOD LEFT FOREARM  Final   Special Requests AEROBIC BOTTLE ONLY Blood Culture adequate volume  Final   Culture NO GROWTH 2 DAYS  Final   Report Status PENDING  Incomplete  Blood culture (routine x 2)     Status: None (Preliminary result)   Collection Time: 04/04/17  7:00 PM  Result Value Ref Range Status   Specimen Description BLOOD BLOOD LEFT WRIST  Final   Special Requests AEROBIC BOTTLE ONLY Blood Culture adequate volume  Final   Culture NO GROWTH 2 DAYS  Final   Report Status PENDING  Incomplete  Urine culture     Status: Abnormal (Preliminary result)   Collection Time: 04/04/17  9:00 PM  Result Value Ref Range Status   Specimen Description URINE, CLEAN CATCH  Final   Special Requests NONE  Final   Culture >=100,000 COLONIES/mL GRAM NEGATIVE RODS (A)  Final   Report Status PENDING  Incomplete         Radiology Studies: Dg Chest Port 1 View  Result Date: 04/06/2017 CLINICAL DATA:  Acute bronchitis. Congestive heart failure. Diabetes. EXAM: PORTABLE CHEST 1 VIEW COMPARISON:  04/04/2017.  02/24/2017. FINDINGS: Mild chronic cardiomegaly. Chronic aortic atherosclerosis. Chronic markings at the left base which appear slightly accentuated. This could be due to atelectasis or mild left base pneumonia. No lobar consolidation or collapse. No evidence of heart failure or effusion. IMPRESSION: Chronic lung markings at the left base, but with more density seen presently. This could be atelectasis or mild left base atelectatic pneumonia. Electronically Signed   By: Nelson Chimes M.D.   On: 04/06/2017 09:24   Dg Chest Portable 1 View  Result Date: 04/04/2017 CLINICAL DATA:  83 year old female with hypoxia, cough and wheezing EXAM: PORTABLE CHEST 1 VIEW COMPARISON:  Prior chest x-ray 02/24/2017 FINDINGS: Stable cardiac mediastinal contours. Atherosclerotic calcifications again  noted in the transverse aorta. Stable bronchitic changes and interstitial prominence. No pulmonary edema, new airspace consolidation, pleural effusion or pneumothorax. No acute osseous abnormality. IMPRESSION: Stable chest x-ray without evidence of acute cardiopulmonary process. Electronically Signed   By: Jacqulynn Cadet M.D.   On: 04/04/2017 18:14        Scheduled Meds: . allopurinol  300 mg Oral QHS  . aspirin EC  81 mg Oral Daily  . calcium carbonate  1.5 tablet Oral QAC supper  . carvedilol  80 mg Oral Daily  . cholecalciferol  3,000 Units Oral Daily  . ezetimibe-simvastatin  1 tablet Oral q1800  . folic acid  1 mg Oral Daily  . heparin  5,000 Units Subcutaneous Q8H  . insulin aspart  0-9 Units Subcutaneous TID WC  . ipratropium-albuterol  3 mL Nebulization BID  . magnesium oxide  400 mg Oral Daily  . potassium chloride SA  40 mEq Oral Daily  . sodium chloride flush  3 mL Intravenous Q12H   Continuous Infusions: . sodium chloride    . cefTRIAXone (ROCEPHIN)  IV Stopped (04/05/17 2221)     LOS: 2 days     Vernell Leep, MD, FACP, [facility]. [facility] Pager [phone]  If 7PM-7AM, please contact night-coverage [email or web address] Password [facility] 04/06/2017, 11:48 AM

## 2017-04-06 NOTE — Progress Notes (Signed)
Tried again to wean pt off O2 from 1L and O2 sat drop to 80% on room air, pt back on O2 at 2L and is saturating at 97%.

## 2017-04-06 NOTE — Evaluation (Signed)
Physical Therapy Evaluation Patient Details Name: Brandi Melton MRN: [id number] DOB: [birth year] Today's Date: 04/06/2017   History of Present Illness  Pt is an 83 y/o female with a PMH significant for IDDM, CKDIII, chronic dCHF, HTN, morbid obesity, RA. She presented to the ED with progressive worsening dyspnea, LEE, lethargy, confusion, CBG in 40's. Pt was admitted for acute hypoxic respiratory failure due to acute on chronic diastolic CHF, confusion, hypoglycemia and acute cystitis.1  Clinical Impression  Pt admitted with above diagnosis. Pt currently with functional limitations due to the deficits listed below (see PT Problem List). At the time of PT eval pt was able to perform transfers and ambulation with gross min guard assist to min assist for balance support and safety. O2 dropped significantly on RA - see below for details. Pt will benefit from skilled PT to increase their independence and safety with mobility to allow discharge to the venue listed below.    SATURATION QUALIFICATIONS: (This note is used to comply with regulatory documentation for home oxygen)  Patient Saturations on Room Air at Rest = 83%  Patient Saturations on 2 Liters of oxygen at rest = 95%  Please briefly explain why patient needs home oxygen: Pt unable to maintain O2 sats >90% at rest on RA at this time. Did not progress to ambulation on RA as it was not safe at this time.     Follow Up Recommendations Home health PT;Supervision for mobility/OOB    Equipment Recommendations  None recommended by PT    Recommendations for Other Services       Precautions / Restrictions Precautions Precautions: Fall Restrictions Weight Bearing Restrictions: No      Mobility  Bed Mobility Overal bed mobility: Needs Assistance Bed Mobility: Supine to Sit     Supine to sit: Min assist     General bed mobility comments: Assist to elevate trunk to full sitting position. Pt reaching for therapist's hand for support.    Transfers Overall transfer level: Needs assistance Equipment used: 4-wheeled walker Transfers: Sit to/from Stand Sit to Stand: Min guard         General transfer comment: VC's for hand placement on seated surface for safety. Hands on guarding provided for stability. VC's for setting brakes on rollator.  Ambulation/Gait Ambulation/Gait assistance: Min guard Ambulation Distance (Feet): 15 Feet Assistive device: 4-wheeled walker Gait Pattern/deviations: Step-through pattern;Decreased stride length;Trunk flexed Gait velocity: Decreased Gait velocity interpretation: Below normal speed for age/gender General Gait Details: VC's for improved posture and general safety with the rollator (closer proximity of rollator).   Stairs            Wheelchair Mobility    Modified Rankin (Stroke Patients Only)       Balance Overall balance assessment: Needs assistance Sitting-balance support: Feet supported;No upper extremity supported Sitting balance-Leahy Scale: Fair     Standing balance support: Bilateral upper extremity supported;During functional activity Standing balance-Leahy Scale: Poor Standing balance comment: reliant on walker for support                             Pertinent Vitals/Pain Pain Assessment: No/denies pain    Home Living Family/patient expects to be discharged to:: Other (Comment)                 Additional Comments: Pt lives in [place] in an apartment.     Prior Function Level of Independence: Needs assistance   Gait / Transfers  Assistance Needed: walks with rollator, takes frequent seated rest breaks  ADL's / Homemaking Assistance Needed: has an aide 5 days a week to help with showering and compression stocking donning, prepares light meals, goes to dining room for one meal, assist for housekeeping        Hand Dominance   Dominant Hand: Right    Extremity/Trunk Assessment   Upper Extremity  Assessment Upper Extremity Assessment: Defer to OT evaluation    Lower Extremity Assessment Lower Extremity Assessment: Generalized weakness    Cervical / Trunk Assessment Cervical / Trunk Assessment: Other exceptions Cervical / Trunk Exceptions: Forward head posture  Communication   Communication: No difficulties  Cognition Arousal/Alertness: Lethargic Behavior During Therapy: Flat affect Overall Cognitive Status: No family/caregiver present to determine baseline cognitive functioning(Likely baseline)                                 General Comments: Pt sleeping when PT arrived and was slow to wake up. Flat throughout session.       General Comments      Exercises     Assessment/Plan    PT Assessment Patient needs continued PT services  PT Problem List Decreased strength;Decreased range of motion;Decreased activity tolerance;Decreased balance;Decreased mobility;Decreased knowledge of use of DME;Decreased safety awareness;Decreased knowledge of precautions;Cardiopulmonary status limiting activity       PT Treatment Interventions DME instruction;Gait training;Stair training;Functional mobility training;Therapeutic activities;Therapeutic exercise;Neuromuscular re-education;Patient/family education    PT Goals (Current goals can be found in the Care Plan section)  Acute Rehab PT Goals Patient Stated Goal: Back to her independent living apartment PT Goal Formulation: With patient Time For Goal Achievement: 04/13/17 Potential to Achieve Goals: Good    Frequency Min 3X/week   Barriers to discharge        Co-evaluation               AM-PAC PT "6 Clicks" Daily Activity  Outcome Measure Difficulty turning over in bed (including adjusting bedclothes, sheets and blankets)?: Unable Difficulty moving from lying on back to sitting on the side of the bed? : Unable Difficulty sitting down on and standing up from a chair with arms (e.g., wheelchair, bedside  commode, etc,.)?: Unable Help needed moving to and from a bed to chair (including a wheelchair)?: A Little Help needed walking in hospital room?: A Little Help needed climbing 3-5 steps with a railing? : Total 6 Click Score: 10    End of Session Equipment Utilized During Treatment: Gait belt;Oxygen Activity Tolerance: Patient limited by fatigue Patient left: in chair;with call bell/phone within reach;with chair alarm set Nurse Communication: Mobility status PT Visit Diagnosis: Unsteadiness on feet (R26.81);Muscle weakness (generalized) (M62.81);Difficulty in walking, not elsewhere classified (R26.2)    Time: 4270-6237 PT Time Calculation (min) (ACUTE ONLY): 24 min   Charges:   PT Evaluation $PT Eval Moderate Complexity: 1 Mod PT Treatments $Gait Training: 8-22 mins   PT G Codes:        Rolinda Roan, PT, DPT Acute Rehabilitation Services Pager: [phone]   Thelma Comp 04/06/2017, 8:42 AM

## 2017-04-07 DIAGNOSIS — N3000 Acute cystitis without hematuria: Secondary | ICD-10-CM

## 2017-04-07 DIAGNOSIS — J208 Acute bronchitis due to other specified organisms: Secondary | ICD-10-CM

## 2017-04-07 LAB — GLUCOSE, CAPILLARY
GLUCOSE-CAPILLARY: 164 mg/dL — AB (ref 65–99)
GLUCOSE-CAPILLARY: 278 mg/dL — AB (ref 65–99)
GLUCOSE-CAPILLARY: 384 mg/dL — AB (ref 65–99)
GLUCOSE-CAPILLARY: 390 mg/dL — AB (ref 65–99)
Glucose-Capillary: 245 mg/dL — ABNORMAL HIGH (ref 65–99)
Glucose-Capillary: 322 mg/dL — ABNORMAL HIGH (ref 65–99)

## 2017-04-07 LAB — BASIC METABOLIC PANEL
ANION GAP: 10 (ref 5–15)
BUN: 32 mg/dL — ABNORMAL HIGH (ref 6–20)
CALCIUM: 8.9 mg/dL (ref 8.9–10.3)
CO2: 35 mmol/L — AB (ref 22–32)
Chloride: 95 mmol/L — ABNORMAL LOW (ref 101–111)
Creatinine, Ser: 1.55 mg/dL — ABNORMAL HIGH (ref 0.44–1.00)
GFR, EST AFRICAN AMERICAN: 34 mL/min — AB (ref >60)
GFR, EST NON AFRICAN AMERICAN: 30 mL/min — AB (ref >60)
GLUCOSE: 171 mg/dL — AB (ref 65–99)
POTASSIUM: 3.5 mmol/L (ref 3.5–5.1)
Sodium: 140 mmol/L (ref 135–145)

## 2017-04-07 LAB — URINE CULTURE

## 2017-04-07 MED ORDER — INSULIN ASPART 100 UNIT/ML ~~LOC~~ SOLN
0.0000 [IU] | Freq: Every day | SUBCUTANEOUS | Status: DC
  Administered 2017-04-07 – 2017-04-09 (×3): 5 [IU] via SUBCUTANEOUS

## 2017-04-07 MED ORDER — BENZONATATE 100 MG PO CAPS
200.0000 mg | ORAL_CAPSULE | Freq: Three times a day (TID) | ORAL | Status: DC
  Administered 2017-04-07 – 2017-04-11 (×13): 200 mg via ORAL
  Filled 2017-04-07 (×13): qty 2

## 2017-04-07 MED ORDER — TORSEMIDE 20 MG PO TABS
60.0000 mg | ORAL_TABLET | Freq: Every day | ORAL | Status: DC
  Administered 2017-04-07 – 2017-04-11 (×5): 60 mg via ORAL
  Filled 2017-04-07 (×5): qty 3

## 2017-04-07 MED ORDER — INSULIN DETEMIR 100 UNIT/ML ~~LOC~~ SOLN: 20.0000 [IU] | Freq: Two times a day (BID) | SUBCUTANEOUS | Status: DC

## 2017-04-07 MED ORDER — IPRATROPIUM-ALBUTEROL 0.5-2.5 (3) MG/3ML IN SOLN
3.0000 mL | Freq: Four times a day (QID) | RESPIRATORY_TRACT | Status: DC
  Administered 2017-04-07 – 2017-04-08 (×4): 3 mL via RESPIRATORY_TRACT
  Filled 2017-04-07 (×5): qty 3

## 2017-04-07 MED ORDER — METHYLPREDNISOLONE SODIUM SUCC 40 MG IJ SOLR
40.0000 mg | Freq: Two times a day (BID) | INTRAMUSCULAR | Status: AC
  Administered 2017-04-07 (×2): 40 mg via INTRAVENOUS
  Filled 2017-04-07 (×2): qty 1

## 2017-04-07 MED ORDER — INSULIN ASPART 100 UNIT/ML ~~LOC~~ SOLN
0.0000 [IU] | Freq: Three times a day (TID) | SUBCUTANEOUS | Status: DC
  Administered 2017-04-07: 11 [IU] via SUBCUTANEOUS
  Administered 2017-04-08 (×3): 15 [IU] via SUBCUTANEOUS
  Administered 2017-04-09: 11 [IU] via SUBCUTANEOUS
  Administered 2017-04-09 (×2): 8 [IU] via SUBCUTANEOUS
  Administered 2017-04-10: 15 [IU] via SUBCUTANEOUS
  Administered 2017-04-10: 11 [IU] via SUBCUTANEOUS
  Administered 2017-04-10 – 2017-04-11 (×2): 8 [IU] via SUBCUTANEOUS

## 2017-04-07 MED ORDER — INSULIN DETEMIR 100 UNIT/ML ~~LOC~~ SOLN
40.0000 [IU] | Freq: Two times a day (BID) | SUBCUTANEOUS | Status: DC
  Administered 2017-04-07: 40 [IU] via SUBCUTANEOUS
  Filled 2017-04-07 (×2): qty 0.4

## 2017-04-07 MED ORDER — INSULIN ASPART 100 UNIT/ML ~~LOC~~ SOLN
0.0000 [IU] | Freq: Three times a day (TID) | SUBCUTANEOUS | Status: DC
  Administered 2017-04-07: 2 [IU] via SUBCUTANEOUS
  Administered 2017-04-07: 3 [IU] via SUBCUTANEOUS

## 2017-04-07 NOTE — Progress Notes (Signed)
Pt is alert times 4 but drowsy and sleepy most of the day, more wheezing, Breathing treatment is continue Q6 hrs, steroids given, getting up and sitting in a recliner, pt was so unsteady so unable to do walking saturation, pt is in oxygen @2l /min via nasal canula, denies chest pain and distress, will continue to monitor the patient

## 2017-04-07 NOTE — Progress Notes (Signed)
PROGRESS NOTE   Brandi Melton  OEH:[id number]    DOB: 25-May-1930    DOA: 04/04/2017  PCP: Haywood Pao, MD   I have briefly reviewed patients previous medical records in [facility].  Brief Narrative:  83 year old female with PMH of IDDM, stage III chronic kidney disease, chronic diastolic CHF, HTN, HLD, morbid obesity, RA, presented to ED with complaints of progressive worsening dyspnea, lower extremity edema, lethargy, confusion, CBG in the 40s on day of admission. Recently seen by PCP and treated with azithromycin without improvement. Admitted for acute hypoxic respiratory failure due to acute on chronic diastolic CHF, confusion, hypoglycemia and acute cystitis.   Assessment & Plan:   Principal Problem:   Acute lower UTI Active Problems:   Diabetes mellitus type 2, insulin dependent (HCC)   Hypertension   CKD (chronic kidney disease), stage III (HCC)   Hypoglycemia due to insulin   Acute on chronic diastolic CHF (congestive heart failure) (HCC)   Acute encephalopathy   Hypoxia   1. Acute toxic metabolic encephalopathy: Likely related to hypoglycemia at home, acute cystitis and hypoxemia. No focal deficits. Seems to have resolved. Stable. 2. IDDM with hypoglycemia: CBG was 40 at home & 23 on arrival to ED where it was treated with 50% D5. Likely secondary to insulin and poor oral intake as confirmed with patient. A1c 7.2 on 02/19/16. Levemir was temporarily held and resumed at low dose when CBG started to increase. Started Solu-Medrol today and CBG is increasing further so will increase Lantus to 40 units twice a day, continue SSI and monitor closely. 3. Klebsiella pneumoniae Acute cystitis: Symptomatic of dysuria. Blood cultures 2: Negative to date. Urine culture shows >100 K colonies per mL of Klebsiella pneumoniae sensitive to ceftriaxone. Continue 3-5 days of IV ceftriaxone. Still has some dysuria. 4. Acute on chronic diastolic CHF: She was symptomatic off dyspnea, leg  edema, hypoxia, peripheral edema, elevated BNP. 2-D echo 12/23: LVEF 55-60 percent and grade 1 diastolic dysfunction. Treated with IV Lasix 60 mg twice a day. - 911 ML since admission but may not be fully accurate. Volume status looks much improved clinically and radiologically. Does not look significantly volume overloaded. Resumed home dose of Demadex 60 mg daily on 12/25. 5. Suspected acute viral bronchitis: Symptoms preceded admission by about a week. Has associated wheezing. Likely main reason for her respiratory symptoms rather than CHF at this time. Treat with IV Solu-Medrol 40 mg every 122 doses and reassess. Tessalon scheduled. Flutter valve. Steroids likely to worsen her DM control. 6. Acute hypoxic respiratory failure: Due to decompensated CHF complicating underlying OSA and possible acute viral bronchitis. Treat underlying cause and wean off oxygen as tolerated. Chest x-ray personally reviewed, left base atelectasis (less likely pneumonia clinically). Incentive spirometry. Management as above. Still hypoxic on room air. 7. Stage III chronic kidney disease: Baseline creatinine may be in the 1.9 range. Presented with creatinine of 1.6. Creatinine improved to 1.5. Monitor while on diuretics. 8. Essential hypertension: Controlled. 9. Hyperlipidemia: Continue home medications. 10. Morbid obesity/Body mass index is 40.46 kg/m. 11. Macrocytic anemia: B12: 444. Outpatient follow-up. 12. Hypokalemia: Replace and follow.    DVT prophylaxis:  Subcutaneous heparin Code Status:  Full Family Communication:  None at bedside. Left VM message for her daughter. Disposition:  DC home when medically improved. PT recommending home health PT at discharge.   Consultants:  None   Procedures:  None  Antimicrobials:  IV ceftriaxone    Subjective: Persistent mostly dry cough. Wheezing  and some dyspnea. Still has some dysuria. No chest pain.   ROS: No chest pain, dizziness, lightheadedness or  palpitations.  Objective:  Vitals:   04/06/17 1226 04/06/17 2008 04/07/17 0425 04/07/17 0924  BP:  (!) 122/41 (!) 140/48   Pulse: 70 72 72   Resp: 19 18 18    Temp:  98.3 F (36.8 C) (!) 97.5 F (36.4 C)   TempSrc:  Oral Oral   SpO2: 92% 97% 94% 91%  Weight:   103.6 kg (228 lb 6.3 oz)     Examination:  General exam: elderly female, moderately built and obese, lying propped up in bed with intermittent cough.  Respiratory system: diminished breath sounds bilaterally with scattered bilateral few medium pitched expiratory rhonchi but no crackles. No stridor. No increased work of breathing.  Cardiovascular system: S1 & S2 heard, RRR. No JVD, murmurs, rubs, gallops or clicks. No leg edema.  Telemetry personally reviewed: SR with BBB morphology. Gastrointestinal system: Abdomen is nondistended, soft and nontender. No organomegaly or masses felt. Normal bowel sounds heard. Stable without change.  Central nervous system: Alert and oriented. No focal neurological deficits. Stable without change .  Extremities: Symmetric 5 x 5 power. Skin: No rashes, lesions or ulcers Psychiatry: Judgement and insight appear normal. Mood & affect appropriate.     Data Reviewed: I have personally reviewed following labs and imaging studies  CBC: Recent Labs  Lab 04/04/17 1755 04/04/17 2243  WBC 10.6*  --   NEUTROABS 7.0  --   HGB 11.9*  --   HCT 40.0 33.2*  MCV 102.0*  --   PLT 224  --    Basic Metabolic Panel: Recent Labs  Lab 04/04/17 1755 04/05/17 0455 04/06/17 0546 04/07/17 0551  NA 140 141 140 140  K 3.7 3.7 3.3* 3.5  CL 98* 101 96* 95*  CO2 31 33* 36* 35*  GLUCOSE 85 106* 160* 171*  BUN 32* 32* 35* 32*  CREATININE 1.61* 1.65* 1.72* 1.55*  CALCIUM 8.8* 8.3* 8.7* 8.9   Liver Function Tests: Recent Labs  Lab 04/04/17 1755  AST 57*  ALT 31  ALKPHOS 88  BILITOT 0.8  PROT 6.8  ALBUMIN 3.1*   Coagulation Profile: Recent Labs  Lab 04/04/17 1755  INR 1.04   CBG: Recent  Labs  Lab 04/06/17 1620 04/06/17 2015 04/07/17 0735 04/07/17 1138 04/07/17 1204  GLUCAP 195* 267* 164* 278* 245*    Recent Results (from the past 240 hour(s))  Blood culture (routine x 2)     Status: None (Preliminary result)   Collection Time: 04/04/17  6:48 PM  Result Value Ref Range Status   Specimen Description BLOOD BLOOD LEFT FOREARM  Final   Special Requests AEROBIC BOTTLE ONLY Blood Culture adequate volume  Final   Culture NO GROWTH 3 DAYS  Final   Report Status PENDING  Incomplete  Blood culture (routine x 2)     Status: None (Preliminary result)   Collection Time: 04/04/17  7:00 PM  Result Value Ref Range Status   Specimen Description BLOOD BLOOD LEFT WRIST  Final   Special Requests AEROBIC BOTTLE ONLY Blood Culture adequate volume  Final   Culture NO GROWTH 3 DAYS  Final   Report Status PENDING  Incomplete  Urine culture     Status: Abnormal   Collection Time: 04/04/17  9:00 PM  Result Value Ref Range Status   Specimen Description URINE, CLEAN CATCH  Final   Special Requests NONE  Final   Culture >=100,000 COLONIES/mL  KLEBSIELLA PNEUMONIAE (A)  Final   Report Status 04/07/2017 FINAL  Final   Organism ID, Bacteria KLEBSIELLA PNEUMONIAE (A)  Final      Susceptibility   Klebsiella pneumoniae - MIC*    AMPICILLIN RESISTANT Resistant     CEFAZOLIN <=4 SENSITIVE Sensitive     CEFTRIAXONE <=1 SENSITIVE Sensitive     CIPROFLOXACIN <=0.25 SENSITIVE Sensitive     GENTAMICIN <=1 SENSITIVE Sensitive     IMIPENEM <=0.25 SENSITIVE Sensitive     NITROFURANTOIN 64 INTERMEDIATE Intermediate     TRIMETH/SULFA <=20 SENSITIVE Sensitive     AMPICILLIN/SULBACTAM 4 SENSITIVE Sensitive     PIP/TAZO <=4 SENSITIVE Sensitive     Extended ESBL NEGATIVE Sensitive     * >=100,000 COLONIES/mL KLEBSIELLA PNEUMONIAE         Radiology Studies: Dg Chest Port 1 View  Result Date: 04/06/2017 CLINICAL DATA:  Acute bronchitis. Congestive heart failure. Diabetes. EXAM: PORTABLE CHEST 1  VIEW COMPARISON:  04/04/2017.  02/24/2017. FINDINGS: Mild chronic cardiomegaly. Chronic aortic atherosclerosis. Chronic markings at the left base which appear slightly accentuated. This could be due to atelectasis or mild left base pneumonia. No lobar consolidation or collapse. No evidence of heart failure or effusion. IMPRESSION: Chronic lung markings at the left base, but with more density seen presently. This could be atelectasis or mild left base atelectatic pneumonia. Electronically Signed   By: Nelson Chimes M.D.   On: 04/06/2017 09:24        Scheduled Meds: . allopurinol  300 mg Oral QHS  . aspirin EC  81 mg Oral Daily  . benzonatate  200 mg Oral TID  . calcium carbonate  1.5 tablet Oral QAC supper  . carvedilol  80 mg Oral Daily  . cholecalciferol  3,000 Units Oral Daily  . ezetimibe-simvastatin  1 tablet Oral q1800  . folic acid  1 mg Oral Daily  . heparin  5,000 Units Subcutaneous Q8H  . insulin aspart  0-5 Units Subcutaneous QHS  . insulin aspart  0-9 Units Subcutaneous TID WC  . insulin detemir  20 Units Subcutaneous BID  . ipratropium-albuterol  3 mL Nebulization Q6H  . magnesium oxide  400 mg Oral Daily  . methylPREDNISolone (SOLU-MEDROL) injection  40 mg Intravenous Q12H  . potassium chloride SA  40 mEq Oral Daily  . sodium chloride flush  3 mL Intravenous Q12H  . torsemide  60 mg Oral Daily   Continuous Infusions: . sodium chloride    . cefTRIAXone (ROCEPHIN)  IV Stopped (04/06/17 2316)     LOS: 3 days     Vernell Leep, MD, FACP, [facility]. [facility] Pager [phone]  If 7PM-7AM, please contact night-coverage [email or web address] Password [email or web address] 04/07/2017, 1:50 PM

## 2017-04-08 LAB — BASIC METABOLIC PANEL
ANION GAP: 11 (ref 5–15)
BUN: 39 mg/dL — AB (ref 6–20)
CHLORIDE: 93 mmol/L — AB (ref 101–111)
CO2: 35 mmol/L — ABNORMAL HIGH (ref 22–32)
Calcium: 9.3 mg/dL (ref 8.9–10.3)
Creatinine, Ser: 1.58 mg/dL — ABNORMAL HIGH (ref 0.44–1.00)
GFR calc Af Amer: 34 mL/min — ABNORMAL LOW (ref >60)
GFR, EST NON AFRICAN AMERICAN: 29 mL/min — AB (ref >60)
Glucose, Bld: 360 mg/dL — ABNORMAL HIGH (ref 65–99)
POTASSIUM: 3.8 mmol/L (ref 3.5–5.1)
SODIUM: 139 mmol/L (ref 135–145)

## 2017-04-08 LAB — GLUCOSE, CAPILLARY
GLUCOSE-CAPILLARY: 136 mg/dL — AB (ref 65–99)
GLUCOSE-CAPILLARY: 362 mg/dL — AB (ref 65–99)
Glucose-Capillary: 416 mg/dL — ABNORMAL HIGH (ref 65–99)
Glucose-Capillary: 403 mg/dL — ABNORMAL HIGH (ref 65–99)
Glucose-Capillary: 355 mg/dL — ABNORMAL HIGH (ref 65–99)

## 2017-04-08 MED ORDER — INSULIN ASPART 100 UNIT/ML ~~LOC~~ SOLN
5.0000 [IU] | Freq: Three times a day (TID) | SUBCUTANEOUS | Status: DC
  Administered 2017-04-08 – 2017-04-09 (×4): 5 [IU] via SUBCUTANEOUS

## 2017-04-08 MED ORDER — METHYLPREDNISOLONE SODIUM SUCC 40 MG IJ SOLR
20.0000 mg | Freq: Two times a day (BID) | INTRAMUSCULAR | Status: DC
  Filled 2017-04-08: qty 1

## 2017-04-08 MED ORDER — IPRATROPIUM-ALBUTEROL 0.5-2.5 (3) MG/3ML IN SOLN: 3.0000 mL | RESPIRATORY_TRACT | Status: DC | PRN

## 2017-04-08 MED ORDER — IPRATROPIUM-ALBUTEROL 0.5-2.5 (3) MG/3ML IN SOLN: 3.0000 mL | Freq: Two times a day (BID) | RESPIRATORY_TRACT | Status: DC

## 2017-04-08 MED ORDER — INSULIN DETEMIR 100 UNIT/ML ~~LOC~~ SOLN
55.0000 [IU] | Freq: Two times a day (BID) | SUBCUTANEOUS | Status: DC
  Administered 2017-04-08: 55 [IU] via SUBCUTANEOUS
  Filled 2017-04-08 (×2): qty 0.55

## 2017-04-08 MED ORDER — INSULIN DETEMIR 100 UNIT/ML ~~LOC~~ SOLN
65.0000 [IU] | Freq: Two times a day (BID) | SUBCUTANEOUS | Status: DC
  Administered 2017-04-08 – 2017-04-11 (×6): 65 [IU] via SUBCUTANEOUS
  Filled 2017-04-08 (×5): qty 0.65
  Filled 2017-04-08: qty 3
  Filled 2017-04-08 (×2): qty 0.65

## 2017-04-08 NOTE — Progress Notes (Signed)
PT Cancellation Note  Patient Details Name: Brandi Melton MRN: [id number] DOB: 03-31-1931   Cancelled Treatment:    Reason Eval/Treat Not Completed: Other (comment).  States she doesn't want to do therapy right now and asks PT to come back another time.  Will return as time and pt allow.   Ramond Dial 04/08/2017, 11:26 AM   Mee Hives, PT MS Acute Rehab Dept. Number: [place] and [place]

## 2017-04-08 NOTE — Progress Notes (Signed)
CRITICAL VALUE ALERT  Critical Value:  Blood glucose 416  Date & Time Notied:  04/08/17 1111  Provider Notified: yes  Orders Received/Actions taken: no new orders

## 2017-04-08 NOTE — Care Management Note (Addendum)
Case Management Note  Patient Details  Name: Brandi Melton MRN: [id number] Date of Birth: 10-26-1930  Subjective/Objective:     Admitted with UTI, hx of  IDDM, stage III chronic kidney disease, chronic diastolic CHF, HTN, HLD, morbid obesity, RA. Resides with family. DME: walker.   Jabier Gauss (Daughter) Meade Maw (Relative)     [phone]        PCP: Domenick Gong     Action/Plan: Transition to home when medically stable.   Expected Discharge Date:                  Expected Discharge Plan:     In-House Referral:     Discharge planning Services     Post Acute Care Choice:    Choice offered to:     DME Arranged:    DME Agency:     HH Arranged:   PT HH Agency:   [place], pending MD's order. CM has requested order from MD.  Status of Service:     If discussed at Long Length of Stay Meetings, dates discussed:    Additional Comments:  Sharin Mons, RN 04/08/2017, 3:35 PM

## 2017-04-08 NOTE — Progress Notes (Addendum)
Pt is stable, vitals stable, denies chest pain, and SOB. Transferred to bed from chair, sleeping sound this time, no complain of coughing, bed time coverage insulin provided  Handoff given at 11pm to upcoming RN from 11pm to continue the care  Palma Holter, [place]

## 2017-04-08 NOTE — Progress Notes (Signed)
PROGRESS NOTE   Brandi Melton  GEX:[id number]    DOB: [birth year]    DOA: 04/04/2017  PCP: Haywood Pao, MD   I have briefly reviewed patients previous medical records in [facility].  Brief Narrative:  83 year old female with PMH of IDDM, stage III chronic kidney disease, chronic diastolic CHF, HTN, HLD, morbid obesity, RA, presented to ED with complaints of progressive worsening dyspnea, lower extremity edema, lethargy, confusion, CBG in the 40s on day of admission. Recently seen by PCP and treated with azithromycin without improvement. Admitted for acute hypoxic respiratory failure due to acute on chronic diastolic CHF, confusion, hypoglycemia and acute cystitis.   Assessment & Plan:   Principal Problem:   Acute lower UTI Active Problems:   Diabetes mellitus type 2, insulin dependent (HCC)   Hypertension   CKD (chronic kidney disease), stage III (HCC)   Hypoglycemia due to insulin   Acute on chronic diastolic CHF (congestive heart failure) (HCC)   Acute encephalopathy   Hypoxia   1. Acute toxic metabolic encephalopathy: Likely related to hypoglycemia at home, acute cystitis and hypoxemia. No focal deficits. Seems to have resolved. Stable. 2. IDDM with hypoglycemia: CBG was 40 at home & 23 on arrival to ED where it was treated with 50% D5. Likely secondary to insulin and poor oral intake as confirmed with patient. A1c 7.2 on 02/19/16. Levemir was temporarily held and resumed at low dose when CBG started to increase. Now CBG is markedly elevated due to IV Solu-Medrol she received yesterday. Increased Levemir back to her home dose 65 units twice a day. Continue moderate NovoLog SSI. Added NovoLog 5 units 3 times a day with meals. 3. Klebsiella pneumoniae Acute cystitis: Symptomatic of dysuria. Blood cultures 2: Negative to date. Urine culture shows >100 K colonies per mL of Klebsiella pneumoniae sensitive to ceftriaxone. Continue 3-5 days of IV ceftriaxone. Improved. 4. Acute on  chronic diastolic CHF: She was symptomatic off dyspnea, leg edema, hypoxia, peripheral edema, elevated BNP. 2-D echo 12/23: LVEF 55-60 percent and grade 1 diastolic dysfunction. Treated with IV Lasix 60 mg twice a day. - 1.3 L since admission but may not be fully accurate. Does not look significantly volume overloaded. Resumed home dose of Demadex 60 mg daily on 12/25. Continue management.  5. Suspected acute viral bronchitis: Symptoms preceded admission by about a week. Had associated wheezing. Likely main reason for her respiratory symptoms rather than CHF at this time. Treated with IV Solu-Medrol 40 mg every 122 doses. Tessalon scheduled. Flutter valve. Significantly better with almost no wheezing or rhonchi. Hesitant to give her any more steroids due to hyperglycemia. Treat supportively. Reassess in a.m. 6. Acute hypoxic respiratory failure: Due to decompensated CHF complicating underlying OSA and possible acute viral bronchitis. Treat underlying cause and wean off oxygen as tolerated. Chest x-ray personally reviewed, left base atelectasis (less likely pneumonia clinically). Incentive spirometry. Management as above. Need to check home oxygen requirement at time of discharge.  7. Stage III chronic kidney disease: Baseline creatinine may be in the 1.9 range. Presented with creatinine of 1.6. Creatinine improved to 1.5. Monitor while on diuretics. Stable.  8. Essential hypertension: Controlled. 9. Hyperlipidemia: Continue home medications. 10. Morbid obesity/Body mass index is 38.52 kg/m. 11. Macrocytic anemia: B12: 444. Outpatient follow-up. 12. Hypokalemia: Replaced    DVT prophylaxis:  Subcutaneous heparin Code Status:  Full, after discussing with her grandson, plan to discuss this again with patient in a.m. Family Communication:  None at bedside. Discussed in detail  with patient's grandson who is a Materials engineer at [employer] [place]. Updated care and answered questions.  Disposition:  DC home  when medically improved. PT recommending home health PT at discharge. Possible DC home in the next 1-2 days.    Consultants:  None   Procedures:  None  Antimicrobials:  IV ceftriaxone    Subjective: Feels better. Dyspnea, wheezing and cough have improved. No dysuria reported.   ROS: No chest pain, dizziness, lightheadedness or palpitations.  Objective:  Vitals:   04/07/17 1939 04/07/17 2058 04/08/17 0410 04/08/17 0859  BP:  (!) 138/41 (!) 159/62   Pulse:  65 91   Resp:  18 19   Temp:  98 F (36.7 C) 98.4 F (36.9 C)   TempSrc:  Oral Oral   SpO2: 94% 95% 92% 95%  Weight:   105 kg (231 lb 7.7 oz)   Height:        Examination:  General exam: elderly female, moderately built and obese, lying propped up in bed looks better than she did yesterday.   Respiratory system: much improved breath sounds compared to yesterday. Clear anteriorly. Occasional rhonchi in the bases but otherwise clear posteriorly as well. No crackles. No increased work of breathing.   Cardiovascular system: S1 & S2 heard, RRR. No JVD, murmurs, rubs, gallops or clicks. No leg edema. Telemetry personally reviewed: SR with BBB morphology.  Gastrointestinal system: Abdomen is nondistended, soft and nontender. No organomegaly or masses felt. Normal bowel sounds heard. Stable without change.  Central nervous system: Alert and oriented. No focal neurological deficits. Stable without change .  Extremities: Symmetric 5 x 5 power. Skin: No rashes, lesions or ulcers Psychiatry: Judgement and insight appear normal. Mood & affect appropriate.     Data Reviewed: I have personally reviewed following labs and imaging studies  CBC: Recent Labs  Lab 04/04/17 1755 04/04/17 2243  WBC 10.6*  --   NEUTROABS 7.0  --   HGB 11.9*  --   HCT 40.0 33.2*  MCV 102.0*  --   PLT 224  --    Basic Metabolic Panel: Recent Labs  Lab 04/04/17 1755 04/05/17 0455 04/06/17 0546 04/07/17 0551 04/08/17 0522  NA 140 141 140  140 139  K 3.7 3.7 3.3* 3.5 3.8  CL 98* 101 96* 95* 93*  CO2 31 33* 36* 35* 35*  GLUCOSE 85 106* 160* 171* 360*  BUN 32* 32* 35* 32* 39*  CREATININE 1.61* 1.65* 1.72* 1.55* 1.58*  CALCIUM 8.8* 8.3* 8.7* 8.9 9.3   Liver Function Tests: Recent Labs  Lab 04/04/17 1755  AST 57*  ALT 31  ALKPHOS 88  BILITOT 0.8  PROT 6.8  ALBUMIN 3.1*   Coagulation Profile: Recent Labs  Lab 04/04/17 1755  INR 1.04   CBG: Recent Labs  Lab 04/07/17 1641 04/07/17 2014 04/07/17 2058 04/08/17 0743 04/08/17 1102  GLUCAP 322* 384* 390* 362* 416*    Recent Results (from the past 240 hour(s))  Blood culture (routine x 2)     Status: None (Preliminary result)   Collection Time: 04/04/17  6:48 PM  Result Value Ref Range Status   Specimen Description BLOOD BLOOD LEFT FOREARM  Final   Special Requests AEROBIC BOTTLE ONLY Blood Culture adequate volume  Final   Culture NO GROWTH 4 DAYS  Final   Report Status PENDING  Incomplete  Blood culture (routine x 2)     Status: None (Preliminary result)   Collection Time: 04/04/17  7:00 PM  Result Value Ref  Range Status   Specimen Description BLOOD BLOOD LEFT WRIST  Final   Special Requests AEROBIC BOTTLE ONLY Blood Culture adequate volume  Final   Culture NO GROWTH 4 DAYS  Final   Report Status PENDING  Incomplete  Urine culture     Status: Abnormal   Collection Time: 04/04/17  9:00 PM  Result Value Ref Range Status   Specimen Description URINE, CLEAN CATCH  Final   Special Requests NONE  Final   Culture >=100,000 COLONIES/mL KLEBSIELLA PNEUMONIAE (A)  Final   Report Status 04/07/2017 FINAL  Final   Organism ID, Bacteria KLEBSIELLA PNEUMONIAE (A)  Final      Susceptibility   Klebsiella pneumoniae - MIC*    AMPICILLIN RESISTANT Resistant     CEFAZOLIN <=4 SENSITIVE Sensitive     CEFTRIAXONE <=1 SENSITIVE Sensitive     CIPROFLOXACIN <=0.25 SENSITIVE Sensitive     GENTAMICIN <=1 SENSITIVE Sensitive     IMIPENEM <=0.25 SENSITIVE Sensitive      NITROFURANTOIN 64 INTERMEDIATE Intermediate     TRIMETH/SULFA <=20 SENSITIVE Sensitive     AMPICILLIN/SULBACTAM 4 SENSITIVE Sensitive     PIP/TAZO <=4 SENSITIVE Sensitive     Extended ESBL NEGATIVE Sensitive     * >=100,000 COLONIES/mL KLEBSIELLA PNEUMONIAE         Radiology Studies: No results found.      Scheduled Meds: . allopurinol  300 mg Oral QHS  . aspirin EC  81 mg Oral Daily  . benzonatate  200 mg Oral TID  . calcium carbonate  1.5 tablet Oral QAC supper  . carvedilol  80 mg Oral Daily  . cholecalciferol  3,000 Units Oral Daily  . ezetimibe-simvastatin  1 tablet Oral q1800  . folic acid  1 mg Oral Daily  . heparin  5,000 Units Subcutaneous Q8H  . insulin aspart  0-15 Units Subcutaneous TID WC  . insulin aspart  0-5 Units Subcutaneous QHS  . insulin aspart  5 Units Subcutaneous TID WC  . insulin detemir  55 Units Subcutaneous BID  . ipratropium-albuterol  3 mL Nebulization BID  . magnesium oxide  400 mg Oral Daily  . methylPREDNISolone (SOLU-MEDROL) injection  20 mg Intravenous Q12H  . potassium chloride SA  40 mEq Oral Daily  . sodium chloride flush  3 mL Intravenous Q12H  . torsemide  60 mg Oral Daily   Continuous Infusions: . sodium chloride    . cefTRIAXone (ROCEPHIN)  IV Stopped (04/07/17 2228)     LOS: 4 days     Vernell Leep, MD, FACP, [facility]. [facility] Pager [phone]  If 7PM-7AM, please contact night-coverage [email or web address] Password [email or web address] 04/08/2017, 4:29 PM

## 2017-04-08 NOTE — Progress Notes (Signed)
Patient lung sounds assessed. No wheezing noted.

## 2017-04-08 NOTE — Progress Notes (Signed)
Physical Therapy Treatment Patient Details Name: Brandi Melton MRN: [id number] DOB: Jan 14, 1931 Today's Date: 04/08/2017    History of Present Illness Pt is an 83 y/o female with a PMH significant for IDDM, CKDIII, chronic dCHF, HTN, morbid obesity, RA. She presented to the ED with progressive worsening dyspnea, LEE, lethargy, confusion, CBG in 40's. Pt was admitted for acute hypoxic respiratory failure due to acute on chronic diastolic CHF, confusion, hypoglycemia and acute cystitis.1    PT Comments    Pt is up to walk with encouragement and then did light LE exercises to increase strength and ROM for standing balance control.  Her plan is to progress to home care, follow up with HHPT and to increase her control of using a walker for safer home gait.  Pt is progressing with therapy and is demonstrating better balance and safety awareness x line management.     Follow Up Recommendations  Home health PT;Supervision for mobility/OOB     Equipment Recommendations  None recommended by PT    Recommendations for Other Services       Precautions / Restrictions Precautions Precautions: Fall Restrictions Weight Bearing Restrictions: No    Mobility  Bed Mobility Overal bed mobility: Needs Assistance Bed Mobility: Supine to Sit     Supine to sit: Min assist     General bed mobility comments: assisted trunk and helped her scoot to EOB  Transfers Overall transfer level: Needs assistance Equipment used: 4-wheeled walker Transfers: Sit to/from Stand Sit to Stand: Min guard         General transfer comment: cues for hand placement but pt is making choices for her mobility aside from PT  Ambulation/Gait Ambulation/Gait assistance: Min guard Ambulation Distance (Feet): 17 Feet Assistive device: 4-wheeled walker Gait Pattern/deviations: Step-through pattern;Decreased stride length;Trunk flexed Gait velocity: Decreased Gait velocity interpretation: Below normal speed for  age/gender General Gait Details: VC's for improved posture and general safety with the rollator (closer proximity of rollator).    Stairs            Wheelchair Mobility    Modified Rankin (Stroke Patients Only)       Balance     Sitting balance-Leahy Scale: Fair     Standing balance support: Bilateral upper extremity supported;During functional activity Standing balance-Leahy Scale: Poor Standing balance comment: reliant on walker for support                            Cognition Arousal/Alertness: Lethargic Behavior During Therapy: Flat affect Overall Cognitive Status: No family/caregiver present to determine baseline cognitive functioning                                 General Comments: gradually became more interactive with PT as session went on       Exercises General Exercises - Lower Extremity Ankle Circles/Pumps: AROM;Both;5 reps Quad Sets: AROM;Both;10 reps Gluteal Sets: AROM;Both;10 reps Hip ABduction/ADduction: AROM;Both;10 reps Straight Leg Raises: AROM;Both;10 reps    General Comments General comments (skin integrity, edema, etc.): pt was able to assist with all mobility and did not attend to her lines      Pertinent Vitals/Pain Pain Assessment: No/denies pain    Home Living                      Prior Function  PT Goals (current goals can now be found in the care plan section) Acute Rehab PT Goals Patient Stated Goal: Back to her independent living apartment Progress towards PT goals: Progressing toward goals    Frequency    Min 3X/week      PT Plan Current plan remains appropriate    Co-evaluation              AM-PAC PT "6 Clicks" Daily Activity  Outcome Measure  Difficulty turning over in bed (including adjusting bedclothes, sheets and blankets)?: Unable Difficulty moving from lying on back to sitting on the side of the bed? : Unable Difficulty sitting down on and standing  up from a chair with arms (e.g., wheelchair, bedside commode, etc,.)?: Unable Help needed moving to and from a bed to chair (including a wheelchair)?: A Little Help needed walking in hospital room?: A Little Help needed climbing 3-5 steps with a railing? : Total 6 Click Score: 10    End of Session Equipment Utilized During Treatment: Gait belt;Oxygen Activity Tolerance: Patient limited by fatigue Patient left: in chair;with call bell/phone within reach;with chair alarm set Nurse Communication: Mobility status PT Visit Diagnosis: Unsteadiness on feet (R26.81);Muscle weakness (generalized) (M62.81);Difficulty in walking, not elsewhere classified (R26.2)     Time: 5631-4970 PT Time Calculation (min) (ACUTE ONLY): 30 min  Charges:  $Gait Training: 8-22 mins $Therapeutic Exercise: 8-22 mins                    G Codes:  Functional Assessment Tool Used: AM-PAC 6 Clicks Basic Mobility     Ramond Dial 04/08/2017, 5:30 PM   Mee Hives, PT MS Acute Rehab Dept. Number: [place] [facility] [place]

## 2017-04-08 NOTE — Care Management Important Message (Signed)
Important Message  Patient Details  Name: Brandi Melton MRN: [id number] Date of Birth: 1930-11-27   Medicare Important Message Given:  Yes    Orbie Pyo 04/08/2017, 2:20 PM

## 2017-04-08 NOTE — Progress Notes (Signed)
CRITICAL VALUE ALERT  Critical Value:  Blood glucose 403  Date & Time Notied:  04/08/17 1648  Provider Notified: yes  Orders Received/Actions taken: MD states to give patient 15 units of novolog insulin

## 2017-04-09 ENCOUNTER — Inpatient Hospital Stay (HOSPITAL_COMMUNITY): Payer: Medicare Other

## 2017-04-09 DIAGNOSIS — R739 Hyperglycemia, unspecified: Secondary | ICD-10-CM

## 2017-04-09 DIAGNOSIS — I5032 Chronic diastolic (congestive) heart failure: Secondary | ICD-10-CM

## 2017-04-09 DIAGNOSIS — J810 Acute pulmonary edema: Secondary | ICD-10-CM

## 2017-04-09 DIAGNOSIS — R062 Wheezing: Secondary | ICD-10-CM

## 2017-04-09 DIAGNOSIS — R0902 Hypoxemia: Secondary | ICD-10-CM

## 2017-04-09 LAB — CULTURE, BLOOD (ROUTINE X 2)
CULTURE: NO GROWTH
Culture: NO GROWTH
SPECIAL REQUESTS: ADEQUATE
SPECIAL REQUESTS: ADEQUATE

## 2017-04-09 LAB — GLUCOSE, CAPILLARY
GLUCOSE-CAPILLARY: 266 mg/dL — AB (ref 65–99)
Glucose-Capillary: 263 mg/dL — ABNORMAL HIGH (ref 65–99)
Glucose-Capillary: 343 mg/dL — ABNORMAL HIGH (ref 65–99)
Glucose-Capillary: 397 mg/dL — ABNORMAL HIGH (ref 65–99)

## 2017-04-09 LAB — BASIC METABOLIC PANEL
Anion gap: 14 (ref 5–15)
BUN: 60 mg/dL — ABNORMAL HIGH (ref 6–20)
CHLORIDE: 90 mmol/L — AB (ref 101–111)
CO2: 35 mmol/L — AB (ref 22–32)
CREATININE: 1.76 mg/dL — AB (ref 0.44–1.00)
Calcium: 9.3 mg/dL (ref 8.9–10.3)
GFR calc non Af Amer: 25 mL/min — ABNORMAL LOW (ref >60)
GFR, EST AFRICAN AMERICAN: 29 mL/min — AB (ref >60)
Glucose, Bld: 286 mg/dL — ABNORMAL HIGH (ref 65–99)
Potassium: 4 mmol/L (ref 3.5–5.1)
Sodium: 139 mmol/L (ref 135–145)

## 2017-04-09 MED ORDER — BUDESONIDE 0.25 MG/2ML IN SUSP: 0.2500 mg | Freq: Two times a day (BID) | RESPIRATORY_TRACT | Status: DC

## 2017-04-09 MED ORDER — METHYLPREDNISOLONE SODIUM SUCC 125 MG IJ SOLR
60.0000 mg | Freq: Two times a day (BID) | INTRAMUSCULAR | Status: DC
  Administered 2017-04-09 (×2): 60 mg via INTRAVENOUS
  Filled 2017-04-09 (×2): qty 2

## 2017-04-09 MED ORDER — HYDROCOD POLST-CPM POLST ER 10-8 MG/5ML PO SUER: 5.0000 mL | Freq: Two times a day (BID) | ORAL | Status: DC | PRN

## 2017-04-09 MED ORDER — INSULIN ASPART 100 UNIT/ML ~~LOC~~ SOLN
15.0000 [IU] | Freq: Three times a day (TID) | SUBCUTANEOUS | Status: DC
  Administered 2017-04-09 – 2017-04-11 (×5): 15 [IU] via SUBCUTANEOUS

## 2017-04-09 MED ORDER — BUDESONIDE 0.25 MG/2ML IN SUSP
0.2500 mg | Freq: Two times a day (BID) | RESPIRATORY_TRACT | Status: DC
  Administered 2017-04-09 – 2017-04-11 (×4): 0.25 mg via RESPIRATORY_TRACT
  Filled 2017-04-09 (×4): qty 2

## 2017-04-09 MED ORDER — INSULIN ASPART 100 UNIT/ML ~~LOC~~ SOLN: 8.0000 [IU] | Freq: Three times a day (TID) | SUBCUTANEOUS | Status: DC

## 2017-04-09 MED ORDER — DEXTROMETHORPHAN POLISTIREX ER 30 MG/5ML PO SUER
30.0000 mg | Freq: Two times a day (BID) | ORAL | Status: DC
  Administered 2017-04-09 – 2017-04-11 (×5): 30 mg via ORAL
  Filled 2017-04-09 (×5): qty 5

## 2017-04-09 NOTE — Progress Notes (Signed)
Inpatient Diabetes Program Recommendations  AACE/ADA: New Consensus Statement on Inpatient Glycemic Control (2015)  Target Ranges:  Prepandial:   less than 140 mg/dL      Peak postprandial:   less than 180 mg/dL (1-2 hours)      Critically ill patients:  140 - 180 mg/dL   Lab Results  Component Value Date   GLUCAP 266 (H) 04/09/2017   HGBA1C 7.2 (H) 02/19/2016    Review of Glycemic ControlResults for Brandi, Melton (MRN [id number]) as of 04/09/2017 10:38  Ref. Range 04/08/2017 07:43 04/08/2017 11:02 04/08/2017 16:37 04/08/2017 20:55 04/09/2017 07:52  Glucose-Capillary Latest Ref Range: 65 - 99 mg/dL 362 (H) 416 (H) 403 (H) 355 (H) 266 (H)   Diabetes history: Type 2 DM Outpatient Diabetes medications: Novolog 45 units tid with meals, Levemir 65 units bid Current orders for Inpatient glycemic control:  Novolog moderate tid with meals and HS, Novolog 5 units tid with meals, Levemir 65 units bid Solumedrol 60 mg IV q 12 hours Inpatient Diabetes Program Recommendations:    Please consider increasing Novolog meal coverage to 20 units tid with meals (note that home dose was 45 units Novolog tid with meals).  Also while on steroids, consider increasing Novolog correction to q 4 hours.    Thanks,  Adah Perl, RN, BC-ADM Inpatient Diabetes Coordinator Pager [phone]

## 2017-04-09 NOTE — Progress Notes (Signed)
RN attempted to obtain patient O2 saturation while ambulating. Pt states she is too tired to get out of bed. RN encouraged pt. Pt still refuses.

## 2017-04-09 NOTE — Consult Note (Signed)
Name: Brandi Melton MRN: [id number] DOB: 11-23-1930    ADMISSION DATE:  04/04/2017 CONSULTATION DATE:  12/27  REFERRING MD :  Algis Liming (triad)   CHIEF COMPLAINT:  Bronchospasm   BRIEF PATIENT DESCRIPTION: 83 year old female with history of diabetes, CKD 3, chronic diastolic CHF, morbid obesity, RA who presented 12/22 with worsening dyspnea, lower extremity edema, confusion, hypoglycemia.  She was admitted by the hospitalist with acute cystitis as well as acute on chronic hypoxic respiratory failure likely secondary to acute on chronic CHF.  She was diuresed with some initial improvement in her dyspnea, however she developed some wheezing and was thought to have acute viral bronchitis.  She had some improvement with IV steroids, but had worsening hyperglycemia and steroids were discontinued.  Pulmonary consulted 12/27 to further assist with dyspnea and ?  Bronchitis.   SIGNIFICANT EVENTS    STUDIES:  2D echo 12/23>>> EF 22-97%, grade 1 diastolic dysfunction, mild MR   HISTORY OF PRESENT ILLNESS:  83 year old female with history of diabetes, CKD 3, chronic diastolic CHF, morbid obesity, RA who presented 12/22 with worsening dyspnea, lower extremity edema, confusion, hypoglycemia.  She was admitted by the hospitalist with acute cystitis as well as acute on chronic hypoxic respiratory failure likely secondary to acute on chronic CHF.  She was diuresed with some initial improvement in her dyspnea, however she developed some wheezing and was thought to have acute viral bronchitis.  She had some improvement with IV steroids, but had worsening hyperglycemia and steroids were discontinued.  Pulmonary consulted 12/27 to further assist with dyspnea and ?  Bronchitis.  Currently c/;o ongoing dyspnea, cough, wheezing, orthopnea. Cough productive of clear sputum.  Denies chest pain, fever, leg/;calf pain.   PAST MEDICAL HISTORY :   has a past medical history of Arthritis, Chronic diastolic (congestive)  heart failure ([place]), CKD (chronic kidney disease), stage III ([place]), Depression, Dyspnea, H/O cardiac catheterization, Hyperlipemia, Hypertensive heart disease, Morbid obesity ([place]), Palsy, Bell's, RA (rheumatoid arthritis) ([place]), Sleep apnea, and Type II diabetes mellitus ([place]).  has a past surgical history that includes Knee Arthroplasty (Left); Colonoscopy; ORIF wrist fracture (Right, 08/08/2014); Carpal tunnel release (Right, 08/08/2014); Fracture surgery; Cholecystectomy open (1966); Appendectomy (1966); Joint replacement; Total hip arthroplasty (Bilateral); Cataract extraction w/ intraocular lens  implant, bilateral (Bilateral); and Cardiac catheterization (05/2013). Prior to Admission medications   Medication Sig Start Date End Date Taking? Authorizing Provider  allopurinol (ZYLOPRIM) 300 MG tablet Take 300 mg by mouth at bedtime.  02/13/16  Yes [provider]  aspirin EC 81 MG tablet Take 81 mg by mouth daily.   Yes [provider]  azithromycin (ZITHROMAX) 250 MG tablet Take 250-500 mg by mouth See admin instructions. Start date 04/03/17: take 2 tablets (500 mg) by mouth 1st day, then take 1 tablet (250 mg) daily on days 2-5 04/03/17  Yes [provider]  calcium carbonate (TUMS EX) 750 MG chewable tablet Chew 1 tablet by mouth daily.   Yes [provider]  carvedilol (COREG CR) 80 MG 24 hr capsule Take 1 capsule (80 mg total) by mouth daily. 07/07/16  Yes Fay Records, MD  chlorhexidine (PERIDEX) 0.12 % solution Use as directed 5 mLs in the mouth or throat daily. Swish and spit 01/17/15  Yes [provider]  cholecalciferol (VITAMIN D) 1000 units tablet Take 3,000 Units by mouth daily.   Yes [provider]  dextromethorphan (DELSYM) 30 MG/5ML liquid Take 60 mg by mouth 2 (two) times daily as needed for  cough.   Yes [provider]  ezetimibe-simvastatin (VYTORIN) 10-40 MG tablet Take 1 tablet by mouth daily. Patient taking  differently: Take 1 tablet by mouth at bedtime.  07/07/16  Yes Fay Records, MD  folic acid (FOLVITE) 852 MCG tablet Take 400 mcg by mouth daily.   Yes [provider]  glucose 4 GM chewable tablet Chew 1 tablet by mouth once as needed for low blood sugar.   Yes [provider]  insulin aspart (NOVOLOG FLEXPEN) 100 UNIT/ML FlexPen Inject 45 Units into the skin See admin instructions. Inject 45 units subcutaneously two to three times daily with meals   Yes [provider]  Insulin Detemir (LEVEMIR FLEXTOUCH) 100 UNIT/ML Pen Inject 65 Units into the skin 2 (two) times daily.   Yes [provider]  magnesium oxide (MAG-OX) 400 MG tablet Take 1 tablet (400 mg total) by mouth daily. 07/07/16  Yes Fay Records, MD  metolazone (ZAROXOLYN) 2.5 MG tablet Take 2.5 mg by mouth See admin instructions. Take 1 tablet (2.5 mg) by mouth daily after torsemide for 3 days (start date 04/03/17 04/03/17  Yes [provider]  torsemide (DEMADEX) 20 MG tablet Take 3 tablets (60 mg total) daily by mouth. 03/01/17  Yes Lendon Colonel, NP  Vitamin D, Ergocalciferol, (DRISDOL) 50000 UNITS CAPS capsule Take 50,000 Units by mouth every Wednesday.    Yes [provider]  potassium chloride SA (K-DUR,KLOR-CON) 20 MEQ tablet TAKE 1 TABLET(20 MEQ) BY MOUTH DAILY 04/06/17   Richardson Dopp T, PA-C   No Known Allergies  FAMILY HISTORY:  family history includes Stroke in her mother; Sudden death in her mother. SOCIAL HISTORY:  reports that  has never smoked. she has never used smokeless tobacco. She reports that she drinks alcohol. She reports that she does not use drugs.  REVIEW OF SYSTEMS:   As per HPI - All other systems reviewed and were neg.    SUBJECTIVE:   VITAL SIGNS: Temp:  [98 F (36.7 C)-98.2 F (36.8 C)] 98.2 F (36.8 C) (12/27 0800) Pulse Rate:  [66-88] 74 (12/27 0800) Resp:  [16-18] 16 (12/27 0800) BP: (140-150)/(52-80) 140/54 (12/27 0800) SpO2:  [94  %-96 %] 94 % (12/27 0800) Weight:  [103.2 kg (227 lb 8.2 oz)] 103.2 kg (227 lb 8.2 oz) (12/27 0503)  PHYSICAL EXAMINATION: General:  Pleasant, obese female, uncomfortable appearing  Neuro:  Awake, alert, appropriate, MAE  HEENT:  Mm moist, no JVD  Cardiovascular:  s1s2 rrr Lungs:  resps even, non labored at rest, mild DOE, dyspnea with speaking, expiratory wheeze throughout  Abdomen:  Round, soft, non tender  Musculoskeletal:  Warm and dry, 1-2+ BLE edema  Recent Labs  Lab 04/07/17 0551 04/08/17 0522 04/09/17 0704  NA 140 139 139  K 3.5 3.8 4.0  CL 95* 93* 90*  CO2 35* 35* 35*  BUN 32* 39* 60*  CREATININE 1.55* 1.58* 1.76*  GLUCOSE 171* 360* 286*   Recent Labs  Lab 04/04/17 1755 04/04/17 2243  HGB 11.9*  --   HCT 40.0 33.2*  WBC 10.6*  --   PLT 224  --    Dg Chest Port 1 View  Result Date: 04/09/2017 CLINICAL DATA:  Sudden onset cough. EXAM: PORTABLE CHEST 1 VIEW COMPARISON:  04/06/2017 FINDINGS: 0841 hours. Low lung volumes. Cardiopericardial silhouette is at upper limits of normal for size. Interstitial markings are diffusely coarsened with chronic features. Scarring again noted left costophrenic angle. Interval decrease in left base opacity seen  previously. The visualized bony structures of the thorax are intact. Telemetry leads overlie the chest. IMPRESSION: Interval improvement in aeration at the left lung base. Otherwise stable. Electronically Signed   By: Misty Stanley M.D.   On: 04/09/2017 09:06    ASSESSMENT / PLAN:  Acute hypoxic respiratory failure-multifactorial in the setting of decompensated CHF, decompensated underlying OSA/OHS +/- acute viral bronchitis.  Less likely infectious etiology.   Plan- Continue diuresis as BP and renal function allows Supplemental O2 as needed to maintain O2 sats 90-92% Pulmonary hygiene Follow-up chest x-ray Will need ambulatory desat study prior to discharge She is on ceftriaxone for cystitis Would resume IV steroids for  now and treat blood sugars aggressively  She is not willing to try CPAP - had at home previously and was unable to tolerate  Aggressive cough suppression -- will add dextromethorphan and low dose tussionex PRN  Nickolas Madrid, NP 04/09/2017  9:38 AM Pager: [phone] or [phone]  Attending Note:  83 year old female with PMH above presenting with SOB and pulmonary edema.  Patient was diuresed and started wheezing.  Steroids were started with subsequent improvement in the breathing but BS increased to 450 and steroids were discontinued.  The following day the patient started having wheezing again and PCCM was called on consultation.  On exam, diffuse wheezing noted.  I reviewed CXR myself, pulmonary edema noted.  Discussed with Dr. Algis Liming and PCCM-NP.  Wheezing due to bronchitis:  - Solumedrol 60 mg IV q12  - Add inhaled steroids  - Bronchodilators as ordered  Hypoxemia:  - Titrate O2 for sat of 88-92%  - May need an ambulatory desaturation study prior to discharge for home O2  Pulmonary edema:  - Diureses as renal function and BP allows  OSA:  - refused CPAP  Cough:  - Dextromethorphan  - Tussionex  Hyperglycemia: steroids necessary but not helping  - May need insulin drip  - Lantus  PCCM will sign off, please call back if needed.  Patient seen and examined, agree with above note.  I dictated the care and orders written for this patient under my direction.  Rush Farmer, [place]

## 2017-04-09 NOTE — Progress Notes (Signed)
PROGRESS NOTE   Brandi Melton  WJX:[id number]    DOB: May 08, 1930    DOA: 04/04/2017  PCP: Haywood Pao, MD   I have briefly reviewed patients previous medical records in [facility].  Brief Narrative:  83 year old female with PMH of IDDM, stage III chronic kidney disease, chronic diastolic CHF, HTN, HLD, morbid obesity, RA, presented to ED with complaints of progressive worsening dyspnea, lower extremity edema, lethargy, confusion, CBG in the 40s on day of admission. Recently seen by PCP and treated with azithromycin without improvement. Admitted for acute hypoxic respiratory failure due to acute on chronic diastolic CHF, confusion, hypoglycemia and acute cystitis. CHF compensated. Confusion resolved. Hypoglycemia resolved, now hyperglycemic due to steroids. Acute cystitis, completed treatment. Now dealing with acute viral bronchitis on IV steroids, pulmonary following.   Assessment & Plan:   Principal Problem:   Acute lower UTI Active Problems:   Diabetes mellitus type 2, insulin dependent (HCC)   Hypertension   CKD (chronic kidney disease), stage III (HCC)   Hypoglycemia due to insulin   Acute on chronic diastolic CHF (congestive heart failure) (HCC)   Acute encephalopathy   Hypoxia   1. Acute toxic metabolic encephalopathy: Likely related to hypoglycemia at home, acute cystitis and hypoxemia. Resolved. 2. IDDM with hypoglycemia: CBG was 40 at home & 23 on arrival to ED where it was treated with 50% D5. Likely secondary to insulin and poor oral intake as confirmed with patient. A1c 7.2 on 02/19/16. Hypoglycemia resolved. Levemir was temporarily held and as she became hyperglycemic, complicated by IV steroids, Levemir increased back to home dose 65 units twice a day. Continue NovoLog moderate SSI. Increased mealtime NovoLog to 15 units. Adjust mealtime NovoLog as needed. If she becomes markedly hyperglycemic, may have to place her on glucose stabilizer temporarily while she is on  IV steroids. Patient on high dose insulin's at home suggesting insulin resistance. Diabetes coordinator input appreciated.  3. Klebsiella pneumoniae Acute cystitis: Symptomatic of dysuria. Blood cultures 2: Negative. Urine culture shows >100 K colonies per mL of Klebsiella pneumoniae sensitive to ceftriaxone. Completed 5 days of IV ceftriaxone, discontinued. Treated.  4. Acute on chronic diastolic CHF: She was symptomatic off dyspnea, leg edema, hypoxia, peripheral edema, elevated BNP. 2-D echo 12/23: LVEF 55-60 percent and grade 1 diastolic dysfunction. Treated with IV Lasix 60 mg twice a day. - 1.4 L since admission. Does not look significantly volume overloaded. Resumed home dose of Demadex 60 mg daily on 12/25. Continue management.  5. Acute viral bronchitis: Symptoms preceded admission by about a week. Had associated wheezing. Likely main reason for her respiratory symptoms rather than CHF at this time. Had improved temporarily with a days course of IV steroids. Worsening cough and wheezing again on 12/27. PCCM consulted and input appreciated. Discussed with them. Resumed IV Solu-Medrol 60 mg every 12 hourly. Patient unwilling to try CPAP. Aggressive cough suppression. Monitor closely.  6. Acute hypoxic respiratory failure: Due to decompensated CHF and acute viral bronchitis  complicating underlying untreated OSA/OHS. Treat underlying cause and wean off oxygen as tolerated. Incentive spirometry & flutter valve . Management as above. Need to check home oxygen requirement at time of discharge.  Chest x-ray 12/27 personally reviewed, apart from possible minimal left basilar atelectasis, appears clear. CODE STATUS discussed in detail and patient wishes DO NOT RESUSCITATE but willing to try BiPAP if needed.  7. Stage III chronic kidney disease: Baseline creatinine may be in the 1.9 range. Presented with creatinine of 1.6. Creatinine  improved to 1.5. Monitor while on diuretics. Creatinine has gone up slightly  to 1.7. Follow BMP in a.m.  8. Essential hypertension: Controlled. 9. Hyperlipidemia: Continue home medications. 10. Morbid obesity/Body mass index is 37.86 kg/m. 11. Macrocytic anemia: B12: 444. Outpatient follow-up. 12. Hypokalemia: Replaced 13. OSA/OHS: as above    DVT prophylaxis:  Subcutaneous heparin Code Status:  Discussed in detail with patient on 12/27 and she wishes to be DO NOT RESUSCITATE. She would like to try BiPAP if needed/indicated.  Family Communication:  None at bedside. Discussed in detail with patient's grandson on 12/26 who is a Materials engineer at [employer] [place].  Disposition:  Not medically stable for discharge. DC home with home health PT when stable.    Consultants:  PCCM  Procedures:  None  Antimicrobials:  IV ceftriaxone -discontinued 12/27    Subjective: Worsening dry cough, wheezing and dyspnea. No chest pain.   ROS: No chest pain, dizziness, lightheadedness or palpitations.  Objective:  Vitals:   04/08/17 1955 04/09/17 0503 04/09/17 0800 04/09/17 1221  BP: 140/80 (!) 150/52 (!) 140/54 (!) 125/53  Pulse: 88 66 74 66  Resp: 18 18 16 18   Temp: 98 F (36.7 C) 98.1 F (36.7 C) 98.2 F (36.8 C) 98.6 F (37 C)  TempSrc: Oral Oral Oral Oral  SpO2: 95% 96% 94% 97%  Weight:  103.2 kg (227 lb 8.2 oz)    Height:        Examination:  General exam: elderly female, moderately built and obese,  sitting up in bed and ongoing intermittent dry hacking cough with hoarse voice.  Respiratory system:  reduced breath sounds bilaterally with scattered bilateral expiratory wheezing and rhonchi but no crackles. No increased work of breathing.  Cardiovascular system: S1 & S2 heard, RRR. No JVD, murmurs, rubs, gallops or clicks. No leg edema. Telemetry personally reviewed: SR with BBB morphology. Stable without change.  Gastrointestinal system: Abdomen is nondistended, soft and nontender. No organomegaly or masses felt. Normal bowel sounds heard. Stable  without change. Central nervous system: Alert and oriented. No focal neurological deficits. Stable without change.  Extremities: Symmetric 5 x 5 power. Skin: No rashes, lesions or ulcers Psychiatry: Judgement and insight appear normal. Mood & affect appropriate.     Data Reviewed: I have personally reviewed following labs and imaging studies  CBC: Recent Labs  Lab 04/04/17 1755 04/04/17 2243  WBC 10.6*  --   NEUTROABS 7.0  --   HGB 11.9*  --   HCT 40.0 33.2*  MCV 102.0*  --   PLT 224  --    Basic Metabolic Panel: Recent Labs  Lab 04/05/17 0455 04/06/17 0546 04/07/17 0551 04/08/17 0522 04/09/17 0704  NA 141 140 140 139 139  K 3.7 3.3* 3.5 3.8 4.0  CL 101 96* 95* 93* 90*  CO2 33* 36* 35* 35* 35*  GLUCOSE 106* 160* 171* 360* 286*  BUN 32* 35* 32* 39* 60*  CREATININE 1.65* 1.72* 1.55* 1.58* 1.76*  CALCIUM 8.3* 8.7* 8.9 9.3 9.3   Liver Function Tests: Recent Labs  Lab 04/04/17 1755  AST 57*  ALT 31  ALKPHOS 88  BILITOT 0.8  PROT 6.8  ALBUMIN 3.1*   Coagulation Profile: Recent Labs  Lab 04/04/17 1755  INR 1.04   CBG: Recent Labs  Lab 04/08/17 1102 04/08/17 1637 04/08/17 2055 04/09/17 0752 04/09/17 1108  GLUCAP 416* 403* 355* 266* 263*    Recent Results (from the past 240 hour(s))  Blood culture (routine x 2)  Status: None   Collection Time: 04/04/17  6:48 PM  Result Value Ref Range Status   Specimen Description BLOOD BLOOD LEFT FOREARM  Final   Special Requests AEROBIC BOTTLE ONLY Blood Culture adequate volume  Final   Culture NO GROWTH 5 DAYS  Final   Report Status 04/09/2017 FINAL  Final  Blood culture (routine x 2)     Status: None   Collection Time: 04/04/17  7:00 PM  Result Value Ref Range Status   Specimen Description BLOOD BLOOD LEFT WRIST  Final   Special Requests AEROBIC BOTTLE ONLY Blood Culture adequate volume  Final   Culture NO GROWTH 5 DAYS  Final   Report Status 04/09/2017 FINAL  Final  Urine culture     Status: Abnormal    Collection Time: 04/04/17  9:00 PM  Result Value Ref Range Status   Specimen Description URINE, CLEAN CATCH  Final   Special Requests NONE  Final   Culture >=100,000 COLONIES/mL KLEBSIELLA PNEUMONIAE (A)  Final   Report Status 04/07/2017 FINAL  Final   Organism ID, Bacteria KLEBSIELLA PNEUMONIAE (A)  Final      Susceptibility   Klebsiella pneumoniae - MIC*    AMPICILLIN RESISTANT Resistant     CEFAZOLIN <=4 SENSITIVE Sensitive     CEFTRIAXONE <=1 SENSITIVE Sensitive     CIPROFLOXACIN <=0.25 SENSITIVE Sensitive     GENTAMICIN <=1 SENSITIVE Sensitive     IMIPENEM <=0.25 SENSITIVE Sensitive     NITROFURANTOIN 64 INTERMEDIATE Intermediate     TRIMETH/SULFA <=20 SENSITIVE Sensitive     AMPICILLIN/SULBACTAM 4 SENSITIVE Sensitive     PIP/TAZO <=4 SENSITIVE Sensitive     Extended ESBL NEGATIVE Sensitive     * >=100,000 COLONIES/mL KLEBSIELLA PNEUMONIAE         Radiology Studies: Dg Chest Port 1 View  Result Date: 04/09/2017 CLINICAL DATA:  Sudden onset cough. EXAM: PORTABLE CHEST 1 VIEW COMPARISON:  04/06/2017 FINDINGS: 0841 hours. Low lung volumes. Cardiopericardial silhouette is at upper limits of normal for size. Interstitial markings are diffusely coarsened with chronic features. Scarring again noted left costophrenic angle. Interval decrease in left base opacity seen previously. The visualized bony structures of the thorax are intact. Telemetry leads overlie the chest. IMPRESSION: Interval improvement in aeration at the left lung base. Otherwise stable. Electronically Signed   By: Misty Stanley M.D.   On: 04/09/2017 09:06        Scheduled Meds: . allopurinol  300 mg Oral QHS  . aspirin EC  81 mg Oral Daily  . benzonatate  200 mg Oral TID  . calcium carbonate  1.5 tablet Oral QAC supper  . carvedilol  80 mg Oral Daily  . cholecalciferol  3,000 Units Oral Daily  . dextromethorphan  30 mg Oral BID  . ezetimibe-simvastatin  1 tablet Oral q1800  . folic acid  1 mg Oral Daily    . heparin  5,000 Units Subcutaneous Q8H  . insulin aspart  0-15 Units Subcutaneous TID WC  . insulin aspart  0-5 Units Subcutaneous QHS  . insulin aspart  15 Units Subcutaneous TID WC  . insulin detemir  65 Units Subcutaneous BID  . magnesium oxide  400 mg Oral Daily  . methylPREDNISolone (SOLU-MEDROL) injection  60 mg Intravenous Q12H  . potassium chloride SA  40 mEq Oral Daily  . sodium chloride flush  3 mL Intravenous Q12H  . torsemide  60 mg Oral Daily   Continuous Infusions: . sodium chloride    . cefTRIAXone (ROCEPHIN)  IV Stopped (04/08/17 2244)     LOS: 5 days     Vernell Leep, MD, FACP, [facility] Pager [phone]  If 7PM-7AM, please contact night-coverage [email or web address] Password [facility] 04/09/2017, 12:52 PM

## 2017-04-10 DIAGNOSIS — J209 Acute bronchitis, unspecified: Secondary | ICD-10-CM

## 2017-04-10 LAB — GLUCOSE, CAPILLARY
GLUCOSE-CAPILLARY: 326 mg/dL — AB (ref 65–99)
GLUCOSE-CAPILLARY: 384 mg/dL — AB (ref 65–99)
Glucose-Capillary: 300 mg/dL — ABNORMAL HIGH (ref 65–99)
Glucose-Capillary: 467 mg/dL — ABNORMAL HIGH (ref 65–99)

## 2017-04-10 LAB — BASIC METABOLIC PANEL
ANION GAP: 11 (ref 5–15)
BUN: 71 mg/dL — AB (ref 6–20)
CHLORIDE: 93 mmol/L — AB (ref 101–111)
CO2: 37 mmol/L — ABNORMAL HIGH (ref 22–32)
Calcium: 9.4 mg/dL (ref 8.9–10.3)
Creatinine, Ser: 1.81 mg/dL — ABNORMAL HIGH (ref 0.44–1.00)
GFR calc non Af Amer: 25 mL/min — ABNORMAL LOW (ref >60)
GFR, EST AFRICAN AMERICAN: 28 mL/min — AB (ref >60)
Glucose, Bld: 346 mg/dL — ABNORMAL HIGH (ref 65–99)
POTASSIUM: 3.8 mmol/L (ref 3.5–5.1)
SODIUM: 141 mmol/L (ref 135–145)

## 2017-04-10 LAB — CBC
HEMATOCRIT: 39.5 % (ref 36.0–46.0)
HEMOGLOBIN: 11.9 g/dL — AB (ref 12.0–15.0)
MCH: 30.1 pg (ref 26.0–34.0)
MCHC: 30.1 g/dL (ref 30.0–36.0)
MCV: 100 fL (ref 78.0–100.0)
Platelets: 240 10*3/uL (ref 150–400)
RBC: 3.95 MIL/uL (ref 3.87–5.11)
RDW: 15.5 % (ref 11.5–15.5)
WBC: 14.4 10*3/uL — AB (ref 4.0–10.5)

## 2017-04-10 MED ORDER — HYDRALAZINE HCL 20 MG/ML IJ SOLN: 5.0000 mg | Freq: Four times a day (QID) | INTRAMUSCULAR | Status: DC | PRN

## 2017-04-10 MED ORDER — PREDNISONE 50 MG PO TABS
60.0000 mg | ORAL_TABLET | Freq: Every day | ORAL | Status: DC
  Administered 2017-04-10 – 2017-04-11 (×2): 60 mg via ORAL
  Filled 2017-04-10 (×2): qty 1

## 2017-04-10 MED ORDER — INSULIN ASPART 100 UNIT/ML ~~LOC~~ SOLN
8.0000 [IU] | Freq: Once | SUBCUTANEOUS | Status: AC
  Administered 2017-04-10: 8 [IU] via SUBCUTANEOUS

## 2017-04-10 NOTE — Progress Notes (Signed)
Inpatient Diabetes Program Recommendations  AACE/ADA: New Consensus Statement on Inpatient Glycemic Control (2015)  Target Ranges:  Prepandial:   less than 140 mg/dL      Peak postprandial:   less than 180 mg/dL (1-2 hours)      Critically ill patients:  140 - 180 mg/dL   Results for DESTENEE, GUERRY (MRN [id number]) as of 04/10/2017 14:46  Ref. Range 04/09/2017 07:52 04/09/2017 11:08 04/09/2017 16:53 04/09/2017 20:50 04/10/2017 07:28 04/10/2017 11:20  Glucose-Capillary Latest Ref Range: 65 - 99 mg/dL 266 (H) 263 (H) 343 (H) 397 (H) 326 (H) 300 (H)   Review of Glycemic Control  Diabetes history: DM2 Outpatient Diabetes medications: Levemir 65 units BID, Novolog 45 units TID with meals Current orders for Inpatient glycemic control: Levemir 65 units BID, Novolog 0-15 units TID with meals, Novolog 0-5 units QHS, Novolog 15 units TID with meals for meal coverage; Prednisone 60 mg QAM.  Inpatient Diabetes Program Recommendations: Insulin - Basal: Noted steroids decreased to Prednisone 60 mg QAM today. If glucose continues to be consistently greater than 200 mg/dl, please consider increasing Levemir to 70 units BID. Please note that if Levemir is increased as recommended, may need to decrease as steroids are tapered off.   Thanks, Barnie Alderman, RN, MSN, CDE Diabetes Coordinator Inpatient Diabetes Program [phone] (Team Pager from 8am to 5pm)

## 2017-04-10 NOTE — Progress Notes (Signed)
Patient ID: Brandi Melton, female   DOB: [birth year], 83 y.o.   MRN: [id number]                                                                PROGRESS NOTE                                                                                                                                                                                                             Patient Demographics:    Brandi Melton, is a 83 y.o. female, DOB - November 01, 1930, LOV:[id number]  Admit date - 04/04/2017   Admitting Physician Vianne Bulls, MD  Outpatient Primary MD for the patient is Tisovec, Fransico Him, MD  LOS - 6  Outpatient Specialists:    Chief Complaint  Patient presents with  . Altered Mental Status       Brief Narrative   83 year old female with PMH of IDDM, stage III chronic kidney disease, chronic diastolic CHF, HTN, HLD, morbid obesity, RA, presented to ED with complaints of progressive worsening dyspnea, lower extremity edema, lethargy, confusion, CBG in the 40s on day of admission. Recently seen by PCP and treated with azithromycin without improvement. Admitted for acute hypoxic respiratory failure due to acute on chronic diastolic CHF, confusion, hypoglycemia and acute cystitis. CHF compensated. Confusion resolved. Hypoglycemia resolved, now hyperglycemic due to steroids. Acute cystitis, completed treatment. Now dealing with acute viral bronchitis on IV steroids, pulmonary following.     Subjective:    Brandi Melton today states that her breathing is slightly better. Still w dry cough. Slight wheeze but improved on steroid.  Afebrile.  Pt denies cp, palp, orthopnea, pnd, lower ext edema.  No constipation. No dysuria.   No Nausea, No new weakness tingling or numbness    Assessment  & Plan :    Principal Problem:   Acute lower UTI Active Problems:   Diabetes mellitus type 2, insulin dependent (HCC)   Hypertension   CKD (chronic kidney disease), stage III (HCC)   Hypoglycemia due to insulin   Acute on  chronic diastolic CHF (congestive heart failure) (HCC)   Acute encephalopathy   Hypoxia    1. Acute toxic metabolic encephalopathy : Likely related to hypoglycemia at home, acute cystitis and hypoxemia. Resolved. 2. IDDM with hypoglycemia: CBG was 40  at home & 23 on arrival to ED where it was treated with 50% D5. Likely secondary to insulin and poor oral intake as confirmed with patient. A1c 7.2 on 02/19/16. Hypoglycemia resolved. Levemir was temporarily held and as she became hyperglycemic, complicated by IV steroids, Levemir increased back to home dose 65 units twice a day. Continue NovoLog moderate SSI. Increased mealtime NovoLog to 15 units. Adjust mealtime NovoLog as needed. If she becomes markedly hyperglycemic, may have to place her on glucose stabilizer temporarily while she is on IV steroids. Patient on high dose insulin's at home suggesting insulin resistance. Diabetes coordinator input appreciated.  3. Klebsiella pneumoniae Acute cystitis: Symptomatic of dysuria. Blood cultures 2: Negative. Urine culture shows >100 K colonies per mL of Klebsiella pneumoniae sensitive to ceftriaxone. Completed 5 days of IV ceftriaxone, discontinued. Treated.  4. Acute on chronic diastolic CHF: She was symptomatic off dyspnea, leg edema, hypoxia, peripheral edema, elevated BNP. 2-D echo 12/23: LVEF 55-60 percent and grade 1 diastolic dysfunction. Treated with IV Lasix 60 mg twice a day. - 1.4 L since admission. Does not look significantly volume overloaded. Resumed home dose of Demadex 60 mg daily on 12/25. Continue management.  5. Acute viral bronchitis: Symptoms preceded admission by about a week. Had associated wheezing. Likely main reason for her respiratory symptoms rather than CHF at this time. Had improved temporarily with a days course of IV steroids. Worsening cough and wheezing again on 12/27. PCCM consulted and input appreciated. Discussed with them. Resumed IV Solu-Medrol 60 mg every 12 hourly. Patient  unwilling to try CPAP. Aggressive cough suppression. Monitor closely. Will convert to prednisone from solumedrol, appreciate PCCM input. Try cpap tonight 6. Acute hypoxic respiratory failure: Due to decompensated CHF and acute viral bronchitis  complicating underlying untreated OSA/OHS. Treat underlying cause and wean off oxygen as tolerated. Incentive spirometry & flutter valve . Management as above. Need to check home oxygen requirement at time of discharge.  Chest x-ray 12/27 personally reviewed, apart from possible minimal left basilar atelectasis, appears clear. CODE STATUS discussed in detail and patient wishes DO NOT RESUSCITATE but willing to try BiPAP if needed.  7. Stage III chronic kidney disease: Baseline creatinine may be in the 1.9 range. Presented with creatinine of 1.6. Creatinine improved to 1.5. Monitor while on diuretics. Creatinine has gone up slightly to 1.7. Follow BMP in a.m.  8. Essential hypertension: Controlled. 9. Hyperlipidemia: Continue home medications. 10. Morbid obesity/Body mass index is 37.86 kg/m. 11. Macrocytic anemia: B12: 444. Outpatient follow-up. 12. Hypokalemia: Replaced 13. OSA/OHS: as above    DVT prophylaxis:  Subcutaneous heparin Code Status:  Discussed in detail with patient on 12/27 and she wishes to be DO NOT RESUSCITATE. She would like to try BiPAP if needed/indicated.  Family Communication:  None at bedside. Prior MD,  Discussed in detail with patient's grandson on 12/26 who is a Materials engineer at [employer] [place].  Disposition:  Not medically stable for discharge. DC home with home health PT when stable.    Consultants:  PCCM  Procedures:  None  Antimicrobials:  IV ceftriaxone -discontinued 12/27     Lab Results  Component Value Date   PLT 224 04/04/2017      Anti-infectives (From admission, onward)   Start     Dose/Rate Route Frequency Ordered Stop   04/05/17 2200  cefTRIAXone (ROCEPHIN) 1 g in dextrose 5 % 50 mL IVPB   Status:  Discontinued     1 g 100 mL/hr over 30 Minutes Intravenous Every  24 hours 04/04/17 2221 04/09/17 1305   04/04/17 2200  cefTRIAXone (ROCEPHIN) 1 g in dextrose 5 % 50 mL IVPB     1 g 100 mL/hr over 30 Minutes Intravenous  Once 04/04/17 2149 04/04/17 2321        Objective:   Vitals:   04/09/17 1221 04/09/17 2016 04/09/17 2024 04/10/17 0501  BP: (!) 125/53 (!) 155/57  (!) 155/60  Pulse: 66 73  72  Resp: 18 16  18   Temp: 98.6 F (37 C) 97.8 F (36.6 C)  98.2 F (36.8 C)  TempSrc: Oral Oral  Oral  SpO2: 97% 95% 95% 96%  Weight:      Height:        Wt Readings from Last 3 Encounters:  04/09/17 103.2 kg (227 lb 8.2 oz)  03/01/17 104.8 kg (231 lb 0.7 oz)  02/23/17 112 kg (247 lb)     Intake/Output Summary (Last 24 hours) at 04/10/2017 0700 Last data filed at 04/10/2017 8101 Gross per 24 hour  Intake 480 ml  Output 2251 ml  Net -1771 ml     Physical Exam  Awake Alert, Oriented X 3, No new F.N deficits, Normal affect [place].AT,PERRAL Supple Neck,No JVD, No cervical lymphadenopathy appriciated.  Symmetrical Chest wall movement, Good air movement bilaterally, slight exp wheezing, few basilar crackles.  RRR,No Gallops,Rubs or new Murmurs, No Parasternal Heave +ve B.Sounds, Abd Soft, No tenderness, No organomegaly appriciated, No rebound - guarding or rigidity. No Cyanosis, Clubbing or edema, No new Rash or bruise     Data Review:    CBC Recent Labs  Lab 04/04/17 1755 04/04/17 2243  WBC 10.6*  --   HGB 11.9*  --   HCT 40.0 33.2*  PLT 224  --   MCV 102.0*  --   MCH 30.4  --   MCHC 29.8*  --   RDW 17.0*  --   LYMPHSABS 2.3  --   MONOABS 1.3*  --   EOSABS 0.1  --   BASOSABS 0.0  --     Chemistries  Recent Labs  Lab 04/04/17 1755 04/05/17 0455 04/06/17 0546 04/07/17 0551 04/08/17 0522 04/09/17 0704  NA 140 141 140 140 139 139  K 3.7 3.7 3.3* 3.5 3.8 4.0  CL 98* 101 96* 95* 93* 90*  CO2 31 33* 36* 35* 35* 35*  GLUCOSE 85 106* 160* 171* 360*  286*  BUN 32* 32* 35* 32* 39* 60*  CREATININE 1.61* 1.65* 1.72* 1.55* 1.58* 1.76*  CALCIUM 8.8* 8.3* 8.7* 8.9 9.3 9.3  AST 57*  --   --   --   --   --   ALT 31  --   --   --   --   --   ALKPHOS 88  --   --   --   --   --   BILITOT 0.8  --   --   --   --   --    ------------------------------------------------------------------------------------------------------------------ No results for input(s): CHOL, HDL, LDLCALC, TRIG, CHOLHDL, LDLDIRECT in the last 72 hours.  Lab Results  Component Value Date   HGBA1C 7.2 (H) 02/19/2016   ------------------------------------------------------------------------------------------------------------------ No results for input(s): TSH, T4TOTAL, T3FREE, THYROIDAB in the last 72 hours.  Invalid input(s): FREET3 ------------------------------------------------------------------------------------------------------------------ No results for input(s): VITAMINB12, FOLATE, FERRITIN, TIBC, IRON, RETICCTPCT in the last 72 hours.  Coagulation profile Recent Labs  Lab 04/04/17 1755  INR 1.04    No results for input(s): DDIMER in the last 72 hours.  Cardiac Enzymes No results for input(s): CKMB, TROPONINI, MYOGLOBIN in the last 168 hours.  Invalid input(s): CK ------------------------------------------------------------------------------------------------------------------    Component Value Date/Time   BNP 215.4 (H) 04/04/2017 2113   BNP 45.6 10/01/2015 0845    Inpatient Medications  Scheduled Meds: . allopurinol  300 mg Oral QHS  . aspirin EC  81 mg Oral Daily  . benzonatate  200 mg Oral TID  . budesonide  0.25 mg Nebulization BID  . calcium carbonate  1.5 tablet Oral QAC supper  . carvedilol  80 mg Oral Daily  . cholecalciferol  3,000 Units Oral Daily  . dextromethorphan  30 mg Oral BID  . ezetimibe-simvastatin  1 tablet Oral q1800  . folic acid  1 mg Oral Daily  . heparin  5,000 Units Subcutaneous Q8H  . insulin aspart  0-15 Units  Subcutaneous TID WC  . insulin aspart  0-5 Units Subcutaneous QHS  . insulin aspart  15 Units Subcutaneous TID WC  . insulin detemir  65 Units Subcutaneous BID  . magnesium oxide  400 mg Oral Daily  . methylPREDNISolone (SOLU-MEDROL) injection  60 mg Intravenous Q12H  . potassium chloride SA  40 mEq Oral Daily  . sodium chloride flush  3 mL Intravenous Q12H  . torsemide  60 mg Oral Daily   Continuous Infusions: . sodium chloride     PRN Meds:.sodium chloride, acetaminophen, chlorpheniramine-HYDROcodone, ipratropium-albuterol, ondansetron (ZOFRAN) IV, sodium chloride flush  Micro Results Recent Results (from the past 240 hour(s))  Blood culture (routine x 2)     Status: None   Collection Time: 04/04/17  6:48 PM  Result Value Ref Range Status   Specimen Description BLOOD BLOOD LEFT FOREARM  Final   Special Requests AEROBIC BOTTLE ONLY Blood Culture adequate volume  Final   Culture NO GROWTH 5 DAYS  Final   Report Status 04/09/2017 FINAL  Final  Blood culture (routine x 2)     Status: None   Collection Time: 04/04/17  7:00 PM  Result Value Ref Range Status   Specimen Description BLOOD BLOOD LEFT WRIST  Final   Special Requests AEROBIC BOTTLE ONLY Blood Culture adequate volume  Final   Culture NO GROWTH 5 DAYS  Final   Report Status 04/09/2017 FINAL  Final  Urine culture     Status: Abnormal   Collection Time: 04/04/17  9:00 PM  Result Value Ref Range Status   Specimen Description URINE, CLEAN CATCH  Final   Special Requests NONE  Final   Culture >=100,000 COLONIES/mL KLEBSIELLA PNEUMONIAE (A)  Final   Report Status 04/07/2017 FINAL  Final   Organism ID, Bacteria KLEBSIELLA PNEUMONIAE (A)  Final      Susceptibility   Klebsiella pneumoniae - MIC*    AMPICILLIN RESISTANT Resistant     CEFAZOLIN <=4 SENSITIVE Sensitive     CEFTRIAXONE <=1 SENSITIVE Sensitive     CIPROFLOXACIN <=0.25 SENSITIVE Sensitive     GENTAMICIN <=1 SENSITIVE Sensitive     IMIPENEM <=0.25 SENSITIVE  Sensitive     NITROFURANTOIN 64 INTERMEDIATE Intermediate     TRIMETH/SULFA <=20 SENSITIVE Sensitive     AMPICILLIN/SULBACTAM 4 SENSITIVE Sensitive     PIP/TAZO <=4 SENSITIVE Sensitive     Extended ESBL NEGATIVE Sensitive     * >=100,000 COLONIES/mL KLEBSIELLA PNEUMONIAE    Radiology Reports Dg Chest Port 1 View  Result Date: 04/09/2017 CLINICAL DATA:  Sudden onset cough. EXAM: PORTABLE CHEST 1 VIEW COMPARISON:  04/06/2017 FINDINGS: 0841 hours. Low lung volumes. Cardiopericardial silhouette is  at upper limits of normal for size. Interstitial markings are diffusely coarsened with chronic features. Scarring again noted left costophrenic angle. Interval decrease in left base opacity seen previously. The visualized bony structures of the thorax are intact. Telemetry leads overlie the chest. IMPRESSION: Interval improvement in aeration at the left lung base. Otherwise stable. Electronically Signed   By: Misty Stanley M.D.   On: 04/09/2017 09:06   Dg Chest Port 1 View  Result Date: 04/06/2017 CLINICAL DATA:  Acute bronchitis. Congestive heart failure. Diabetes. EXAM: PORTABLE CHEST 1 VIEW COMPARISON:  04/04/2017.  02/24/2017. FINDINGS: Mild chronic cardiomegaly. Chronic aortic atherosclerosis. Chronic markings at the left base which appear slightly accentuated. This could be due to atelectasis or mild left base pneumonia. No lobar consolidation or collapse. No evidence of heart failure or effusion. IMPRESSION: Chronic lung markings at the left base, but with more density seen presently. This could be atelectasis or mild left base atelectatic pneumonia. Electronically Signed   By: Nelson Chimes M.D.   On: 04/06/2017 09:24   Dg Chest Portable 1 View  Result Date: 04/04/2017 CLINICAL DATA:  83 year old female with hypoxia, cough and wheezing EXAM: PORTABLE CHEST 1 VIEW COMPARISON:  Prior chest x-ray 02/24/2017 FINDINGS: Stable cardiac mediastinal contours. Atherosclerotic calcifications again noted in  the transverse aorta. Stable bronchitic changes and interstitial prominence. No pulmonary edema, new airspace consolidation, pleural effusion or pneumothorax. No acute osseous abnormality. IMPRESSION: Stable chest x-ray without evidence of acute cardiopulmonary process. Electronically Signed   By: Jacqulynn Cadet M.D.   On: 04/04/2017 18:14    Time Spent in minutes  30   Jani Gravel M.D on 04/10/2017 at 7:00 AM  Between 7am to 7pm - Pager - [phone]  After 7pm go to [email or web address] - password [facility]  [facility] -  Office  [phone]

## 2017-04-10 NOTE — Progress Notes (Signed)
Physical Therapy Treatment Patient Details Name: Brandi Melton MRN: [id number] DOB: Sep 30, 1930 Today's Date: 04/10/2017    History of Present Illness Pt is an 83 y/o female with a PMH significant for IDDM, CKDIII, chronic dCHF, HTN, morbid obesity, RA. She presented to the ED with progressive worsening dyspnea, LEE, lethargy, confusion, CBG in 40's. Pt was admitted for acute hypoxic respiratory failure due to acute on chronic diastolic CHF, confusion, hypoglycemia and acute cystitis.1    PT Comments    Spoke with RN prior to session, and was advised a home oxygen study was performed and pt could ambulate w/o O2.Pt ambulated 60 ft on RA with cues for breathing. SpO2 fell to 85% and pt c/o dizziness, requiring 1 seated rest break. On RA SpO2 returned to 92% after ~3 min. Pt was able to return to room without additional symptoms, however SpO2 did drop back down to 85%. At end of session. pt placed back on 2 L of O2 via nasal cannula. RN notified of pts vitals.    Follow Up Recommendations  Home health PT;Supervision for mobility/OOB     Equipment Recommendations  None recommended by PT    Recommendations for Other Services       Precautions / Restrictions Precautions Precautions: Fall Restrictions Weight Bearing Restrictions: No    Mobility  Bed Mobility               General bed mobility comments: in chair on arrival  Transfers Overall transfer level: Needs assistance Equipment used: 4-wheeled walker Transfers: Sit to/from Stand Sit to Stand: Min guard         General transfer comment: Cues for breaks on rollator during transfer  Ambulation/Gait Ambulation/Gait assistance: Min guard Ambulation Distance (Feet): 120 Feet(60 x2) Assistive device: 4-wheeled walker Gait Pattern/deviations: Step-through pattern;Decreased stride length;Trunk flexed Gait velocity: Decreased Gait velocity interpretation: Below normal speed for age/gender General Gait Details: VC for  posture and walker proximity. Pt ambulated 60 ft in hall with cues for breathing. SpO2 fell to 85% and pt c/o dizziness, requiring 1 seated rest break. On RA SpO2 returned to 92% after ~3 min. Pt was able to return to room without additional symptoms, however SpO2 did drop back down to 85%. At end of session. pt placed back on 2 L of O2 via nasal cannula   Stairs            Wheelchair Mobility    Modified Rankin (Stroke Patients Only)       Balance Overall balance assessment: Needs assistance Sitting-balance support: Feet supported;No upper extremity supported Sitting balance-Leahy Scale: Fair     Standing balance support: Bilateral upper extremity supported;During functional activity Standing balance-Leahy Scale: Poor Standing balance comment: reliant on walker for support                            Cognition Arousal/Alertness: Awake/alert Behavior During Therapy: WFL for tasks assessed/performed Overall Cognitive Status: Within Functional Limits for tasks assessed                                        Exercises      General Comments        Pertinent Vitals/Pain Pain Assessment: No/denies pain    Home Living  Prior Function            PT Goals (current goals can now be found in the care plan section) Acute Rehab PT Goals Patient Stated Goal: Back to her independent living apartment PT Goal Formulation: With patient Time For Goal Achievement: 04/13/17 Potential to Achieve Goals: Good Progress towards PT goals: Progressing toward goals    Frequency    Min 3X/week      PT Plan Current plan remains appropriate    Co-evaluation              AM-PAC PT "6 Clicks" Daily Activity  Outcome Measure  Difficulty turning over in bed (including adjusting bedclothes, sheets and blankets)?: Unable Difficulty moving from lying on back to sitting on the side of the bed? : Unable Difficulty sitting  down on and standing up from a chair with arms (e.g., wheelchair, bedside commode, etc,.)?: Unable Help needed moving to and from a bed to chair (including a wheelchair)?: A Little Help needed walking in hospital room?: A Little Help needed climbing 3-5 steps with a railing? : Total 6 Click Score: 10    End of Session Equipment Utilized During Treatment: Gait belt Activity Tolerance: Patient tolerated treatment well Patient left: in chair;with call bell/phone within reach Nurse Communication: Mobility status PT Visit Diagnosis: Unsteadiness on feet (R26.81);Muscle weakness (generalized) (M62.81);Difficulty in walking, not elsewhere classified (R26.2)     Time: 2883-3744 PT Time Calculation (min) (ACUTE ONLY): 12 min  Charges:  $Gait Training: 8-22 mins                    G Codes:       Benjiman Core, [place] Pager [phone] Acute Rehab   Allena Katz 04/10/2017, 2:13 PM

## 2017-04-11 LAB — CBC
HEMATOCRIT: 41 % (ref 36.0–46.0)
Hemoglobin: 12.3 g/dL (ref 12.0–15.0)
MCH: 29.8 pg (ref 26.0–34.0)
MCHC: 30 g/dL (ref 30.0–36.0)
MCV: 99.3 fL (ref 78.0–100.0)
PLATELETS: 238 10*3/uL (ref 150–400)
RBC: 4.13 MIL/uL (ref 3.87–5.11)
RDW: 15.4 % (ref 11.5–15.5)
WBC: 16.2 10*3/uL — ABNORMAL HIGH (ref 4.0–10.5)

## 2017-04-11 LAB — COMPREHENSIVE METABOLIC PANEL
ALK PHOS: 86 U/L (ref 38–126)
ALT: 46 U/L (ref 14–54)
AST: 49 U/L — ABNORMAL HIGH (ref 15–41)
Albumin: 3.1 g/dL — ABNORMAL LOW (ref 3.5–5.0)
Anion gap: 15 (ref 5–15)
BILIRUBIN TOTAL: 0.4 mg/dL (ref 0.3–1.2)
BUN: 82 mg/dL — AB (ref 6–20)
CALCIUM: 9.4 mg/dL (ref 8.9–10.3)
CHLORIDE: 89 mmol/L — AB (ref 101–111)
CO2: 34 mmol/L — ABNORMAL HIGH (ref 22–32)
CREATININE: 1.75 mg/dL — AB (ref 0.44–1.00)
GFR, EST AFRICAN AMERICAN: 30 mL/min — AB (ref >60)
GFR, EST NON AFRICAN AMERICAN: 26 mL/min — AB (ref >60)
Glucose, Bld: 292 mg/dL — ABNORMAL HIGH (ref 65–99)
Potassium: 3.3 mmol/L — ABNORMAL LOW (ref 3.5–5.1)
Sodium: 138 mmol/L (ref 135–145)
TOTAL PROTEIN: 7.1 g/dL (ref 6.5–8.1)

## 2017-04-11 LAB — GLUCOSE, CAPILLARY: GLUCOSE-CAPILLARY: 258 mg/dL — AB (ref 65–99)

## 2017-04-11 MED ORDER — BENZONATATE 200 MG PO CAPS: 200.0000 mg | ORAL_CAPSULE | Freq: Three times a day (TID) | ORAL | 0 refills | Status: DC

## 2017-04-11 MED ORDER — HYDROCOD POLST-CPM POLST ER 10-8 MG/5ML PO SUER: 5.0000 mL | Freq: Two times a day (BID) | ORAL | 0 refills | Status: DC | PRN

## 2017-04-11 MED ORDER — PREDNISONE 10 MG PO TABS: ORAL_TABLET | ORAL | 0 refills | Status: DC

## 2017-04-11 MED ORDER — POTASSIUM CHLORIDE CRYS ER 20 MEQ PO TBCR
20.0000 meq | EXTENDED_RELEASE_TABLET | Freq: Once | ORAL | Status: AC
  Administered 2017-04-11: 20 meq via ORAL
  Filled 2017-04-11: qty 1

## 2017-04-11 MED ORDER — PREDNISONE 50 MG PO TABS: 50.0000 mg | ORAL_TABLET | Freq: Every day | ORAL | Status: DC

## 2017-04-11 NOTE — Discharge Summary (Signed)
Brandi Melton, is a 83 y.o. female  DOB [birth year]  MRN [id number].  Admission date:  04/04/2017  Admitting Physician  Vianne Bulls, MD  Discharge Date:  04/11/2017   Primary MD  Tisovec, Fransico Him, MD  Recommendations for primary care physician for things to follow:    Acute hypoxic resp failure though secondary to CHF (diastolic) Cont bumex 60mg  po qday Check daily weight  Heart healthy diet/Diabetic diet Home health RN to evaluate and tx disease state May draw bmp weekly til seen by pcp /cardiology Please call pcp or cardiology Dorris Carnes) if gains more than 3lbs Check bmp in 1-2 weeks  Viral bronchitis Prednisone taper  Leukocytosis Secondary to steroids Repeat cbc in 1 -2 weeks  OSA recommended that she f/u with pcp Consider cpap  Hypokalemia repleted Check cmp in 1 -2 weeks  CKD stage 3 Check cmp in 1-2 weeks  AMS, acute toxic encephalopathy / UTI Resolved  Klebsiella uti Pt finished course of rocephin while in hospital  DM2 Continue lantus and novolog  Gait instability Home health PT to evaluate and tx  Abnormal liver function ? Due to simvastatin vs fatty liver Check cmp in 1-2 weeks     Admission Diagnosis  heart problem   Discharge Diagnosis  heart problem    Principal Problem:   Acute lower UTI Active Problems:   Diabetes mellitus type 2, insulin dependent (HCC)   Hypertension   CKD (chronic kidney disease), stage III (HCC)   Hypoglycemia due to insulin   Acute on chronic diastolic CHF (congestive heart failure) (HCC)   Acute encephalopathy   Hypoxia   Acute bronchitis      Past Medical History:  Diagnosis Date  . Arthritis    "hips before they were replaced, right knee now" (11/09/2015)  . Chronic diastolic (congestive) heart failure (HCC)    a. echo 06/2015: EF 65-70%, Grade 1 DD, mitral valve calcification.  . CKD (chronic kidney  disease), stage III ([place])   . Depression   . Dyspnea   . H/O cardiac catheterization    a. h/o cardiac catheterization 05/2013 in [place] showing only minimal CAD with increased filling pressure.  . Hyperlipemia   . Hypertensive heart disease   . Morbid obesity ([place])   . Palsy, Bell's   . RA (rheumatoid arthritis) ([place])   . Sleep apnea    "suppose to wear mask; couldn't" (11/09/2015)  . Type II diabetes mellitus ([place])     Past Surgical History:  Procedure Laterality Date  . APPENDECTOMY  1966  . CARDIAC CATHETERIZATION  05/2013  . CARPAL TUNNEL RELEASE Right 08/08/2014   Procedure: OPEN CARPAL TUNNEL RELEASE;  Surgeon: Roseanne Kaufman, MD;  Location: [facility];  Service: Orthopedics;  Laterality: Right;  . CATARACT EXTRACTION W/ INTRAOCULAR LENS  IMPLANT, BILATERAL Bilateral   . CHOLECYSTECTOMY OPEN  1966  . COLONOSCOPY    . FRACTURE SURGERY    . JOINT REPLACEMENT    . KNEE ARTHROPLASTY Left   .  ORIF WRIST FRACTURE Right 08/08/2014   Procedure: Right OPEN REDUCTION INTERNAL FIXATION (ORIF) WRIST FRACTURE WITH REPAIR AND RECONST/ALLOGRAFT AND BONE GRAFT;  Surgeon: Roseanne Kaufman, MD;  Location: [facility];  Service: Orthopedics;  Laterality: Right;  . TOTAL HIP ARTHROPLASTY Bilateral        HPI  from the history and physical done on the day of admission:     83 y.o. female with medical history significant for insulin-dependent diabetes mellitus, chronic kidney disease stage III, and chronic diastolic CHF, now presenting to the emergency department for evaluation of shortness of breath, swelling, confusion, and hypoglycemia.  She is accompanied by family who provide much of the history.  Patient had been doing fairly well until approximately 1 week ago when she developed shortness of breath, accompanied by progressive lower extremity edema bilaterally.  She has become more lethargic over this interval and was particularly lethargic today.  She was also quite confused, family checked her sugar,  found it to be in the 51s.  She had been seen for these complaints by her PCP earlier in the course of the illness and treated with azithromycin, but with no appreciable improvement.  In fact, she has continued to worsen.  With her hypoglycemia, family administered glucose tabs and called EMS.  ED Course: Upon arrival to the ED, patient is found to be afebrile, requiring 3 L/min of supplemental oxygen in order to maintain saturations in the 90s, and with vitals otherwise stable.  EKG features a sinus rhythm with RBBB and first-degree AV nodal block.  Chest x-ray is stable.  Chemistry panel reveals a creatinine of 1.61, up from priors closer to 2.  CBC is notable for a leukocytosis to 10,600 and a stable anemia with hemoglobin of 11.9, and a new macrocytosis with MCV of 102.  Lactic acid is reassuringly normal and troponin is within normal limits x2.  BNP is elevated to 215.  Urinalysis is suggestive of infection.  Blood and urine cultures were collected, 50% dextrose was administered x2, and the patient was treated with empiric Rocephin in the ED.  She continues to be tachypneic, saturating well on supplemental oxygen via nasal cannula, hemodynamically stable, and will be admitted to the telemetry unit for ongoing evaluation and shortness of breath with swelling and new oxygen requirement likely secondary to acute on chronic diastolic CHF, as well as confusion, hypoglycemia, and UTI.        Hospital Course:     Pt was admitted for uti and started on rocephin.  Urine culture => klebsiella sensitive to rocephin.  Pt completed course of iv abx.  Confusion improved and her mental status is back to baseline.  Her dyspnea was thought to be related to acute on chronic diastolic CHF even though her initial CXR (12/22),=> stable CXR without evidence of acute cardiopulmonary process.   Pt was started on lasix 60mg  iv bid and transitioned to her regular bumex dose. troponins negative. Cardiac echo 12/23/=> EF  55-60%, mild MR, mild diastolic dysfunction. Pt has CKD stage 3 and creatinine has remained about 1.6=> 1.7.  Pt has OSA and has declined cpap.    Pt continued to have some wheezing and pccm was consulted and thought she had bronchitis and recommended solumedrol iv and taper to oral prednisone as well as budesonide neb.  Pt is feeling much better this AM and is not wheezing. Pt desires to be discharge to home.     Follow UP  Follow-up Information    [facility] Follow up.   Specialty:  Home Health Services Why:  home health service arranged, office will call and set up home visits Contact information: [address] [place] [address] [phone]        Tisovec, Fransico Him, MD Follow up in 1 week(s).   Specialty:  Internal Medicine Contact information: [address] [place] [address] [phone]            Consults obtained - pulmonary  Discharge Condition: stable  Diet and Activity recommendation: See Discharge Instructions below  Discharge Instructions         Discharge Medications     Allergies as of 04/11/2017   No Known Allergies     Medication List    STOP taking these medications   azithromycin 250 MG tablet Commonly known as:  ZITHROMAX   DELSYM 30 MG/5ML liquid Generic drug:  dextromethorphan     TAKE these medications   allopurinol 300 MG tablet Commonly known as:  ZYLOPRIM Take 300 mg by mouth at bedtime.   aspirin EC 81 MG tablet Take 81 mg by mouth daily.   benzonatate 200 MG capsule Commonly known as:  TESSALON Take 1 capsule (200 mg total) by mouth 3 (three) times daily.   calcium carbonate 750 MG chewable tablet Commonly known as:  TUMS EX Chew 1 tablet by mouth daily.   carvedilol 80 MG 24 hr capsule Commonly known as:  COREG CR Take 1 capsule (80 mg total) by mouth daily.   chlorhexidine 0.12 % solution Commonly known as:  PERIDEX Use as directed 5 mLs in the mouth or throat daily. Swish  and spit   chlorpheniramine-HYDROcodone 10-8 MG/5ML Suer Commonly known as:  TUSSIONEX Take 5 mLs by mouth every 12 (twelve) hours as needed for cough.   cholecalciferol 1000 units tablet Commonly known as:  VITAMIN D Take 3,000 Units by mouth daily.   ezetimibe-simvastatin 10-40 MG tablet Commonly known as:  VYTORIN Take 1 tablet by mouth daily. What changed:  when to take this   folic acid 659 MCG tablet Commonly known as:  FOLVITE Take 400 mcg by mouth daily.   glucose 4 GM chewable tablet Chew 1 tablet by mouth once as needed for low blood sugar.   LEVEMIR FLEXTOUCH 100 UNIT/ML Pen Generic drug:  Insulin Detemir Inject 65 Units into the skin 2 (two) times daily.   magnesium oxide 400 MG tablet Commonly known as:  MAG-OX Take 1 tablet (400 mg total) by mouth daily.   metolazone 2.5 MG tablet Commonly known as:  ZAROXOLYN Take 2.5 mg by mouth See admin instructions. Take 1 tablet (2.5 mg) by mouth daily after torsemide for 3 days (start date 04/03/17   NOVOLOG FLEXPEN 100 UNIT/ML FlexPen Generic drug:  insulin aspart Inject 45 Units into the skin See admin instructions. Inject 45 units subcutaneously two to three times daily with meals   potassium chloride SA 20 MEQ tablet Commonly known as:  K-DUR,KLOR-CON TAKE 1 TABLET(20 MEQ) BY MOUTH DAILY What changed:  See the new instructions.   predniSONE 10 MG tablet Commonly known as:  DELTASONE 40mg  po qday x 1 day then 30mg  po qday x 1 day then 20mg  po qday x 1 day then 10mg  po qday x 1 day for bronchitis Start taking on:  04/12/2017   torsemide 20 MG tablet Commonly known as:  DEMADEX Take 3 tablets (60 mg total) daily by mouth.   Vitamin D (Ergocalciferol) 50000 units Caps capsule  Commonly known as:  DRISDOL Take 50,000 Units by mouth every Wednesday.            Durable Medical Equipment  (From admission, onward)        Start     Ordered   04/11/17 0838  Heart failure home health orders  (Heart  failure home health orders / Face to face)  Once    Comments:  Heart Failure Follow-up Care:  Verify follow-up appointments per Patient Discharge Instructions. Confirm transportation arranged. Reconcile home medications with discharge medication list. Remove discontinued medications from use. Assist patient/caregiver to manage medications using pill box. Reinforce low sodium food selection Assessments: Vital signs and oxygen saturation at each visit. Assess home environment for safety concerns, caregiver support and availability of low-sodium foods. Consult Education officer, museum, PT/OT, Dietitian, and CNA based on assessments. Perform comprehensive cardiopulmonary assessment. Notify MD for any change in condition or weight gain of 3 pounds in one day or 5 pounds in one week with symptoms. Daily Weights and Symptom Monitoring: Ensure patient has access to scales. Teach patient/caregiver to weigh daily before breakfast and after voiding using same scale and record.    Teach patient/caregiver to track weight and symptoms and when to notify Provider. Activity: Develop individualized activity plan with patient/caregiver.   Question Answer Comment  Heart Failure Follow-up Care [facility] at [phone]   Obtain the following labs Basic Metabolic Panel   Lab frequency Weekly   Fax lab results to [facility] at [phone]   Diet Low Sodium Heart Healthy   Fluid restrictions: 1800 mL Fluid   Skilled Nurse to notify MD of weight trends weekly for first 2 weeks. May fax or call: [facility] at [phone] (fax) or [phone]   Consult: Case manager   Consult: Social work   Initiate Heart Failure Clinic Diuretic Protocol to be used by [facility] [place] only ( to be ordered by Heart Failure Team Providers Only) Yes      04/11/17 0841      Major procedures and Radiology Reports - PLEASE review detailed and final reports for all details, in brief -      Dg Chest Port 1  View  Result Date: 04/09/2017 CLINICAL DATA:  Sudden onset cough. EXAM: PORTABLE CHEST 1 VIEW COMPARISON:  04/06/2017 FINDINGS: 0841 hours. Low lung volumes. Cardiopericardial silhouette is at upper limits of normal for size. Interstitial markings are diffusely coarsened with chronic features. Scarring again noted left costophrenic angle. Interval decrease in left base opacity seen previously. The visualized bony structures of the thorax are intact. Telemetry leads overlie the chest. IMPRESSION: Interval improvement in aeration at the left lung base. Otherwise stable. Electronically Signed   By: Misty Stanley M.D.   On: 04/09/2017 09:06   Dg Chest Port 1 View  Result Date: 04/06/2017 CLINICAL DATA:  Acute bronchitis. Congestive heart failure. Diabetes. EXAM: PORTABLE CHEST 1 VIEW COMPARISON:  04/04/2017.  02/24/2017. FINDINGS: Mild chronic cardiomegaly. Chronic aortic atherosclerosis. Chronic markings at the left base which appear slightly accentuated. This could be due to atelectasis or mild left base pneumonia. No lobar consolidation or collapse. No evidence of heart failure or effusion. IMPRESSION: Chronic lung markings at the left base, but with more density seen presently. This could be atelectasis or mild left base atelectatic pneumonia. Electronically Signed   By: Nelson Chimes M.D.   On: 04/06/2017 09:24   Dg Chest Portable 1 View  Result Date: 04/04/2017 CLINICAL DATA:  83 year old female with  hypoxia, cough and wheezing EXAM: PORTABLE CHEST 1 VIEW COMPARISON:  Prior chest x-ray 02/24/2017 FINDINGS: Stable cardiac mediastinal contours. Atherosclerotic calcifications again noted in the transverse aorta. Stable bronchitic changes and interstitial prominence. No pulmonary edema, new airspace consolidation, pleural effusion or pneumothorax. No acute osseous abnormality. IMPRESSION: Stable chest x-ray without evidence of acute cardiopulmonary process. Electronically Signed   By: Jacqulynn Cadet  M.D.   On: 04/04/2017 18:14    Micro Results     Recent Results (from the past 240 hour(s))  Blood culture (routine x 2)     Status: None   Collection Time: 04/04/17  6:48 PM  Result Value Ref Range Status   Specimen Description BLOOD BLOOD LEFT FOREARM  Final   Special Requests AEROBIC BOTTLE ONLY Blood Culture adequate volume  Final   Culture NO GROWTH 5 DAYS  Final   Report Status 04/09/2017 FINAL  Final  Blood culture (routine x 2)     Status: None   Collection Time: 04/04/17  7:00 PM  Result Value Ref Range Status   Specimen Description BLOOD BLOOD LEFT WRIST  Final   Special Requests AEROBIC BOTTLE ONLY Blood Culture adequate volume  Final   Culture NO GROWTH 5 DAYS  Final   Report Status 04/09/2017 FINAL  Final  Urine culture     Status: Abnormal   Collection Time: 04/04/17  9:00 PM  Result Value Ref Range Status   Specimen Description URINE, CLEAN CATCH  Final   Special Requests NONE  Final   Culture >=100,000 COLONIES/mL KLEBSIELLA PNEUMONIAE (A)  Final   Report Status 04/07/2017 FINAL  Final   Organism ID, Bacteria KLEBSIELLA PNEUMONIAE (A)  Final      Susceptibility   Klebsiella pneumoniae - MIC*    AMPICILLIN RESISTANT Resistant     CEFAZOLIN <=4 SENSITIVE Sensitive     CEFTRIAXONE <=1 SENSITIVE Sensitive     CIPROFLOXACIN <=0.25 SENSITIVE Sensitive     GENTAMICIN <=1 SENSITIVE Sensitive     IMIPENEM <=0.25 SENSITIVE Sensitive     NITROFURANTOIN 64 INTERMEDIATE Intermediate     TRIMETH/SULFA <=20 SENSITIVE Sensitive     AMPICILLIN/SULBACTAM 4 SENSITIVE Sensitive     PIP/TAZO <=4 SENSITIVE Sensitive     Extended ESBL NEGATIVE Sensitive     * >=100,000 COLONIES/mL KLEBSIELLA PNEUMONIAE       Today   Subjective    Brandi Melton today states that her breathing is much better.  Pox 95% on RA,   no headache,no chest abdominal pain,no new weakness tingling or numbness, feels much better wants to go home today.   Objective   Blood pressure (!) 158/74,  pulse 63, temperature (!) 97.5 F (36.4 C), temperature source Oral, resp. rate 18, height 5\' 5"  (1.651 m), weight 100.2 kg (221 lb), SpO2 99 %.   Intake/Output Summary (Last 24 hours) at 04/11/2017 0936 Last data filed at 04/11/2017 2751 Gross per 24 hour  Intake 240 ml  Output 1950 ml  Net -1710 ml    Exam Awake Alert, Oriented x 3, No new F.N deficits, Normal affect [place].AT,PERRAL Supple Neck,No JVD, No cervical lymphadenopathy appriciated.  Symmetrical Chest wall movement, Good air movement bilaterally, CTAB, no wheezing.  RRR,No Gallops,Rubs or new Murmurs, No Parasternal Heave +ve B.Sounds, Abd Soft, Non tender, No organomegaly appriciated, No rebound -guarding or rigidity. No Cyanosis, Clubbing or edema, No new Rash or bruise   Data Review   CBC w Diff:  Lab Results  Component Value Date   WBC 16.2 (  H) 04/11/2017   HGB 12.3 04/11/2017   HCT 41.0 04/11/2017   HCT 33.2 (L) 04/04/2017   PLT 238 04/11/2017   LYMPHOPCT 21 04/04/2017   MONOPCT 12 04/04/2017   EOSPCT 1 04/04/2017   BASOPCT 0 04/04/2017    CMP:  Lab Results  Component Value Date   NA 138 04/11/2017   NA 143 01/05/2017   K 3.3 (L) 04/11/2017   CL 89 (L) 04/11/2017   CO2 34 (H) 04/11/2017   BUN 82 (H) 04/11/2017   BUN 34 (H) 01/05/2017   CREATININE 1.75 (H) 04/11/2017   CREATININE 1.30 (H) 03/03/2016   PROT 7.1 04/11/2017   ALBUMIN 3.1 (L) 04/11/2017   BILITOT 0.4 04/11/2017   ALKPHOS 86 04/11/2017   AST 49 (H) 04/11/2017   ALT 46 04/11/2017  .   Total Time in preparing paper work, data evaluation and todays exam - 29 minutes  Jani Gravel M.D on 04/11/2017 at 9:36 AM  [facility]   Office  [phone]

## 2017-04-11 NOTE — Care Management (Signed)
Spoke with Colman Cater of [facility] to advise of pt d/c today.  SOC to begin within 24-48 hours.  No further CM needs at this time.

## 2017-04-13 DIAGNOSIS — M1990 Unspecified osteoarthritis, unspecified site: Secondary | ICD-10-CM | POA: Diagnosis not present

## 2017-04-13 DIAGNOSIS — Z7982 Long term (current) use of aspirin: Secondary | ICD-10-CM | POA: Diagnosis not present

## 2017-04-13 DIAGNOSIS — E162 Hypoglycemia, unspecified: Secondary | ICD-10-CM | POA: Diagnosis not present

## 2017-04-13 DIAGNOSIS — Z794 Long term (current) use of insulin: Secondary | ICD-10-CM | POA: Diagnosis not present

## 2017-04-13 DIAGNOSIS — E161 Other hypoglycemia: Secondary | ICD-10-CM | POA: Diagnosis not present

## 2017-04-13 DIAGNOSIS — N183 Chronic kidney disease, stage 3 (moderate): Secondary | ICD-10-CM | POA: Diagnosis not present

## 2017-04-13 DIAGNOSIS — B961 Klebsiella pneumoniae [K. pneumoniae] as the cause of diseases classified elsewhere: Secondary | ICD-10-CM | POA: Diagnosis not present

## 2017-04-13 DIAGNOSIS — N390 Urinary tract infection, site not specified: Secondary | ICD-10-CM | POA: Diagnosis not present

## 2017-04-13 DIAGNOSIS — Z9181 History of falling: Secondary | ICD-10-CM | POA: Diagnosis not present

## 2017-04-13 DIAGNOSIS — G510 Bell's palsy: Secondary | ICD-10-CM | POA: Diagnosis not present

## 2017-04-13 DIAGNOSIS — E1122 Type 2 diabetes mellitus with diabetic chronic kidney disease: Secondary | ICD-10-CM | POA: Diagnosis not present

## 2017-04-13 DIAGNOSIS — M069 Rheumatoid arthritis, unspecified: Secondary | ICD-10-CM | POA: Diagnosis not present

## 2017-04-13 DIAGNOSIS — I5032 Chronic diastolic (congestive) heart failure: Secondary | ICD-10-CM | POA: Diagnosis not present

## 2017-04-13 DIAGNOSIS — F329 Major depressive disorder, single episode, unspecified: Secondary | ICD-10-CM | POA: Diagnosis not present

## 2017-04-13 DIAGNOSIS — I130 Hypertensive heart and chronic kidney disease with heart failure and stage 1 through stage 4 chronic kidney disease, or unspecified chronic kidney disease: Secondary | ICD-10-CM | POA: Diagnosis not present

## 2017-04-13 DIAGNOSIS — M109 Gout, unspecified: Secondary | ICD-10-CM | POA: Diagnosis not present

## 2017-04-13 DIAGNOSIS — G4733 Obstructive sleep apnea (adult) (pediatric): Secondary | ICD-10-CM | POA: Diagnosis not present

## 2017-04-13 DIAGNOSIS — J209 Acute bronchitis, unspecified: Secondary | ICD-10-CM | POA: Diagnosis not present

## 2017-04-15 ENCOUNTER — Ambulatory Visit: Payer: Medicare Other | Admitting: Physician Assistant

## 2017-04-16 DIAGNOSIS — Z6841 Body Mass Index (BMI) 40.0 and over, adult: Secondary | ICD-10-CM | POA: Diagnosis not present

## 2017-04-16 DIAGNOSIS — M1000 Idiopathic gout, unspecified site: Secondary | ICD-10-CM | POA: Diagnosis not present

## 2017-04-16 DIAGNOSIS — R600 Localized edema: Secondary | ICD-10-CM | POA: Diagnosis not present

## 2017-04-16 DIAGNOSIS — I5023 Acute on chronic systolic (congestive) heart failure: Secondary | ICD-10-CM | POA: Diagnosis not present

## 2017-04-16 DIAGNOSIS — M109 Gout, unspecified: Secondary | ICD-10-CM | POA: Diagnosis not present

## 2017-04-16 DIAGNOSIS — J40 Bronchitis, not specified as acute or chronic: Secondary | ICD-10-CM | POA: Diagnosis not present

## 2017-04-16 DIAGNOSIS — I130 Hypertensive heart and chronic kidney disease with heart failure and stage 1 through stage 4 chronic kidney disease, or unspecified chronic kidney disease: Secondary | ICD-10-CM | POA: Diagnosis not present

## 2017-04-16 DIAGNOSIS — I5032 Chronic diastolic (congestive) heart failure: Secondary | ICD-10-CM | POA: Diagnosis not present

## 2017-04-16 DIAGNOSIS — E1129 Type 2 diabetes mellitus with other diabetic kidney complication: Secondary | ICD-10-CM | POA: Diagnosis not present

## 2017-04-16 DIAGNOSIS — D631 Anemia in chronic kidney disease: Secondary | ICD-10-CM | POA: Diagnosis not present

## 2017-04-16 DIAGNOSIS — N184 Chronic kidney disease, stage 4 (severe): Secondary | ICD-10-CM | POA: Diagnosis not present

## 2017-04-16 DIAGNOSIS — E7800 Pure hypercholesterolemia, unspecified: Secondary | ICD-10-CM | POA: Diagnosis not present

## 2017-04-16 DIAGNOSIS — I10 Essential (primary) hypertension: Secondary | ICD-10-CM | POA: Diagnosis not present

## 2017-04-16 DIAGNOSIS — E1151 Type 2 diabetes mellitus with diabetic peripheral angiopathy without gangrene: Secondary | ICD-10-CM | POA: Diagnosis not present

## 2017-04-17 DIAGNOSIS — R82998 Other abnormal findings in urine: Secondary | ICD-10-CM | POA: Diagnosis not present

## 2017-04-18 DIAGNOSIS — I5032 Chronic diastolic (congestive) heart failure: Secondary | ICD-10-CM | POA: Diagnosis not present

## 2017-04-18 DIAGNOSIS — M1990 Unspecified osteoarthritis, unspecified site: Secondary | ICD-10-CM | POA: Diagnosis not present

## 2017-04-18 DIAGNOSIS — N3000 Acute cystitis without hematuria: Secondary | ICD-10-CM | POA: Diagnosis not present

## 2017-04-18 DIAGNOSIS — D539 Nutritional anemia, unspecified: Secondary | ICD-10-CM | POA: Diagnosis not present

## 2017-04-18 DIAGNOSIS — I130 Hypertensive heart and chronic kidney disease with heart failure and stage 1 through stage 4 chronic kidney disease, or unspecified chronic kidney disease: Secondary | ICD-10-CM | POA: Diagnosis not present

## 2017-04-18 DIAGNOSIS — B961 Klebsiella pneumoniae [K. pneumoniae] as the cause of diseases classified elsewhere: Secondary | ICD-10-CM | POA: Diagnosis not present

## 2017-04-18 DIAGNOSIS — N183 Chronic kidney disease, stage 3 (moderate): Secondary | ICD-10-CM | POA: Diagnosis not present

## 2017-04-18 DIAGNOSIS — J209 Acute bronchitis, unspecified: Secondary | ICD-10-CM | POA: Diagnosis not present

## 2017-04-18 DIAGNOSIS — L89322 Pressure ulcer of left buttock, stage 2: Secondary | ICD-10-CM | POA: Diagnosis not present

## 2017-04-18 DIAGNOSIS — E1122 Type 2 diabetes mellitus with diabetic chronic kidney disease: Secondary | ICD-10-CM | POA: Diagnosis not present

## 2017-04-19 DIAGNOSIS — I5032 Chronic diastolic (congestive) heart failure: Secondary | ICD-10-CM | POA: Diagnosis not present

## 2017-04-19 DIAGNOSIS — I130 Hypertensive heart and chronic kidney disease with heart failure and stage 1 through stage 4 chronic kidney disease, or unspecified chronic kidney disease: Secondary | ICD-10-CM | POA: Diagnosis not present

## 2017-04-19 DIAGNOSIS — N183 Chronic kidney disease, stage 3 (moderate): Secondary | ICD-10-CM | POA: Diagnosis not present

## 2017-04-19 DIAGNOSIS — M1990 Unspecified osteoarthritis, unspecified site: Secondary | ICD-10-CM | POA: Diagnosis not present

## 2017-04-19 DIAGNOSIS — N3000 Acute cystitis without hematuria: Secondary | ICD-10-CM | POA: Diagnosis not present

## 2017-04-19 DIAGNOSIS — L89322 Pressure ulcer of left buttock, stage 2: Secondary | ICD-10-CM | POA: Diagnosis not present

## 2017-04-19 DIAGNOSIS — E1122 Type 2 diabetes mellitus with diabetic chronic kidney disease: Secondary | ICD-10-CM | POA: Diagnosis not present

## 2017-04-19 DIAGNOSIS — J209 Acute bronchitis, unspecified: Secondary | ICD-10-CM | POA: Diagnosis not present

## 2017-04-19 DIAGNOSIS — D539 Nutritional anemia, unspecified: Secondary | ICD-10-CM | POA: Diagnosis not present

## 2017-04-19 DIAGNOSIS — B961 Klebsiella pneumoniae [K. pneumoniae] as the cause of diseases classified elsewhere: Secondary | ICD-10-CM | POA: Diagnosis not present

## 2017-04-21 DIAGNOSIS — E1129 Type 2 diabetes mellitus with other diabetic kidney complication: Secondary | ICD-10-CM | POA: Diagnosis not present

## 2017-04-21 DIAGNOSIS — N390 Urinary tract infection, site not specified: Secondary | ICD-10-CM | POA: Diagnosis not present

## 2017-04-21 DIAGNOSIS — I10 Essential (primary) hypertension: Secondary | ICD-10-CM | POA: Diagnosis not present

## 2017-04-21 DIAGNOSIS — R410 Disorientation, unspecified: Secondary | ICD-10-CM | POA: Diagnosis not present

## 2017-04-21 DIAGNOSIS — I5023 Acute on chronic systolic (congestive) heart failure: Secondary | ICD-10-CM | POA: Diagnosis not present

## 2017-04-21 DIAGNOSIS — N184 Chronic kidney disease, stage 4 (severe): Secondary | ICD-10-CM | POA: Diagnosis not present

## 2017-04-21 DIAGNOSIS — Z6841 Body Mass Index (BMI) 40.0 and over, adult: Secondary | ICD-10-CM | POA: Diagnosis not present

## 2017-04-22 DIAGNOSIS — B961 Klebsiella pneumoniae [K. pneumoniae] as the cause of diseases classified elsewhere: Secondary | ICD-10-CM | POA: Diagnosis not present

## 2017-04-22 DIAGNOSIS — M1990 Unspecified osteoarthritis, unspecified site: Secondary | ICD-10-CM | POA: Diagnosis not present

## 2017-04-22 DIAGNOSIS — D539 Nutritional anemia, unspecified: Secondary | ICD-10-CM | POA: Diagnosis not present

## 2017-04-22 DIAGNOSIS — J209 Acute bronchitis, unspecified: Secondary | ICD-10-CM | POA: Diagnosis not present

## 2017-04-22 DIAGNOSIS — N183 Chronic kidney disease, stage 3 (moderate): Secondary | ICD-10-CM | POA: Diagnosis not present

## 2017-04-22 DIAGNOSIS — N3000 Acute cystitis without hematuria: Secondary | ICD-10-CM | POA: Diagnosis not present

## 2017-04-22 DIAGNOSIS — I130 Hypertensive heart and chronic kidney disease with heart failure and stage 1 through stage 4 chronic kidney disease, or unspecified chronic kidney disease: Secondary | ICD-10-CM | POA: Diagnosis not present

## 2017-04-22 DIAGNOSIS — L89322 Pressure ulcer of left buttock, stage 2: Secondary | ICD-10-CM | POA: Diagnosis not present

## 2017-04-22 DIAGNOSIS — E1122 Type 2 diabetes mellitus with diabetic chronic kidney disease: Secondary | ICD-10-CM | POA: Diagnosis not present

## 2017-04-22 DIAGNOSIS — I5032 Chronic diastolic (congestive) heart failure: Secondary | ICD-10-CM | POA: Diagnosis not present

## 2017-04-23 DIAGNOSIS — I130 Hypertensive heart and chronic kidney disease with heart failure and stage 1 through stage 4 chronic kidney disease, or unspecified chronic kidney disease: Secondary | ICD-10-CM | POA: Diagnosis not present

## 2017-04-23 DIAGNOSIS — J209 Acute bronchitis, unspecified: Secondary | ICD-10-CM | POA: Diagnosis not present

## 2017-04-23 DIAGNOSIS — L89322 Pressure ulcer of left buttock, stage 2: Secondary | ICD-10-CM | POA: Diagnosis not present

## 2017-04-23 DIAGNOSIS — D539 Nutritional anemia, unspecified: Secondary | ICD-10-CM | POA: Diagnosis not present

## 2017-04-23 DIAGNOSIS — N3000 Acute cystitis without hematuria: Secondary | ICD-10-CM | POA: Diagnosis not present

## 2017-04-23 DIAGNOSIS — I5032 Chronic diastolic (congestive) heart failure: Secondary | ICD-10-CM | POA: Diagnosis not present

## 2017-04-23 DIAGNOSIS — E1122 Type 2 diabetes mellitus with diabetic chronic kidney disease: Secondary | ICD-10-CM | POA: Diagnosis not present

## 2017-04-23 DIAGNOSIS — M1990 Unspecified osteoarthritis, unspecified site: Secondary | ICD-10-CM | POA: Diagnosis not present

## 2017-04-23 DIAGNOSIS — N183 Chronic kidney disease, stage 3 (moderate): Secondary | ICD-10-CM | POA: Diagnosis not present

## 2017-04-23 DIAGNOSIS — B961 Klebsiella pneumoniae [K. pneumoniae] as the cause of diseases classified elsewhere: Secondary | ICD-10-CM | POA: Diagnosis not present

## 2017-04-24 DIAGNOSIS — N183 Chronic kidney disease, stage 3 (moderate): Secondary | ICD-10-CM | POA: Diagnosis not present

## 2017-04-24 DIAGNOSIS — B961 Klebsiella pneumoniae [K. pneumoniae] as the cause of diseases classified elsewhere: Secondary | ICD-10-CM | POA: Diagnosis not present

## 2017-04-24 DIAGNOSIS — L89322 Pressure ulcer of left buttock, stage 2: Secondary | ICD-10-CM | POA: Diagnosis not present

## 2017-04-24 DIAGNOSIS — I5032 Chronic diastolic (congestive) heart failure: Secondary | ICD-10-CM | POA: Diagnosis not present

## 2017-04-24 DIAGNOSIS — I130 Hypertensive heart and chronic kidney disease with heart failure and stage 1 through stage 4 chronic kidney disease, or unspecified chronic kidney disease: Secondary | ICD-10-CM | POA: Diagnosis not present

## 2017-04-24 DIAGNOSIS — M1990 Unspecified osteoarthritis, unspecified site: Secondary | ICD-10-CM | POA: Diagnosis not present

## 2017-04-24 DIAGNOSIS — D539 Nutritional anemia, unspecified: Secondary | ICD-10-CM | POA: Diagnosis not present

## 2017-04-24 DIAGNOSIS — J209 Acute bronchitis, unspecified: Secondary | ICD-10-CM | POA: Diagnosis not present

## 2017-04-24 DIAGNOSIS — N3000 Acute cystitis without hematuria: Secondary | ICD-10-CM | POA: Diagnosis not present

## 2017-04-24 DIAGNOSIS — E1122 Type 2 diabetes mellitus with diabetic chronic kidney disease: Secondary | ICD-10-CM | POA: Diagnosis not present

## 2017-04-25 DIAGNOSIS — D539 Nutritional anemia, unspecified: Secondary | ICD-10-CM | POA: Diagnosis not present

## 2017-04-25 DIAGNOSIS — N183 Chronic kidney disease, stage 3 (moderate): Secondary | ICD-10-CM | POA: Diagnosis not present

## 2017-04-25 DIAGNOSIS — I130 Hypertensive heart and chronic kidney disease with heart failure and stage 1 through stage 4 chronic kidney disease, or unspecified chronic kidney disease: Secondary | ICD-10-CM | POA: Diagnosis not present

## 2017-04-25 DIAGNOSIS — M1990 Unspecified osteoarthritis, unspecified site: Secondary | ICD-10-CM | POA: Diagnosis not present

## 2017-04-25 DIAGNOSIS — I5032 Chronic diastolic (congestive) heart failure: Secondary | ICD-10-CM | POA: Diagnosis not present

## 2017-04-25 DIAGNOSIS — L89322 Pressure ulcer of left buttock, stage 2: Secondary | ICD-10-CM | POA: Diagnosis not present

## 2017-04-25 DIAGNOSIS — N3000 Acute cystitis without hematuria: Secondary | ICD-10-CM | POA: Diagnosis not present

## 2017-04-25 DIAGNOSIS — J209 Acute bronchitis, unspecified: Secondary | ICD-10-CM | POA: Diagnosis not present

## 2017-04-25 DIAGNOSIS — B961 Klebsiella pneumoniae [K. pneumoniae] as the cause of diseases classified elsewhere: Secondary | ICD-10-CM | POA: Diagnosis not present

## 2017-04-25 DIAGNOSIS — E1122 Type 2 diabetes mellitus with diabetic chronic kidney disease: Secondary | ICD-10-CM | POA: Diagnosis not present

## 2017-04-28 DIAGNOSIS — I130 Hypertensive heart and chronic kidney disease with heart failure and stage 1 through stage 4 chronic kidney disease, or unspecified chronic kidney disease: Secondary | ICD-10-CM | POA: Diagnosis not present

## 2017-04-28 DIAGNOSIS — J209 Acute bronchitis, unspecified: Secondary | ICD-10-CM | POA: Diagnosis not present

## 2017-04-28 DIAGNOSIS — L89322 Pressure ulcer of left buttock, stage 2: Secondary | ICD-10-CM | POA: Diagnosis not present

## 2017-04-28 DIAGNOSIS — B961 Klebsiella pneumoniae [K. pneumoniae] as the cause of diseases classified elsewhere: Secondary | ICD-10-CM | POA: Diagnosis not present

## 2017-04-28 DIAGNOSIS — I5032 Chronic diastolic (congestive) heart failure: Secondary | ICD-10-CM | POA: Diagnosis not present

## 2017-04-28 DIAGNOSIS — E1122 Type 2 diabetes mellitus with diabetic chronic kidney disease: Secondary | ICD-10-CM | POA: Diagnosis not present

## 2017-04-28 DIAGNOSIS — M1990 Unspecified osteoarthritis, unspecified site: Secondary | ICD-10-CM | POA: Diagnosis not present

## 2017-04-28 DIAGNOSIS — D539 Nutritional anemia, unspecified: Secondary | ICD-10-CM | POA: Diagnosis not present

## 2017-04-28 DIAGNOSIS — N183 Chronic kidney disease, stage 3 (moderate): Secondary | ICD-10-CM | POA: Diagnosis not present

## 2017-04-28 DIAGNOSIS — N3000 Acute cystitis without hematuria: Secondary | ICD-10-CM | POA: Diagnosis not present

## 2017-04-29 DIAGNOSIS — I5032 Chronic diastolic (congestive) heart failure: Secondary | ICD-10-CM | POA: Diagnosis not present

## 2017-04-29 DIAGNOSIS — E1122 Type 2 diabetes mellitus with diabetic chronic kidney disease: Secondary | ICD-10-CM | POA: Diagnosis not present

## 2017-04-29 DIAGNOSIS — N390 Urinary tract infection, site not specified: Secondary | ICD-10-CM | POA: Diagnosis not present

## 2017-04-29 DIAGNOSIS — D539 Nutritional anemia, unspecified: Secondary | ICD-10-CM | POA: Diagnosis not present

## 2017-04-29 DIAGNOSIS — I130 Hypertensive heart and chronic kidney disease with heart failure and stage 1 through stage 4 chronic kidney disease, or unspecified chronic kidney disease: Secondary | ICD-10-CM | POA: Diagnosis not present

## 2017-04-29 DIAGNOSIS — M1990 Unspecified osteoarthritis, unspecified site: Secondary | ICD-10-CM | POA: Diagnosis not present

## 2017-04-29 DIAGNOSIS — J209 Acute bronchitis, unspecified: Secondary | ICD-10-CM | POA: Diagnosis not present

## 2017-04-29 DIAGNOSIS — L89322 Pressure ulcer of left buttock, stage 2: Secondary | ICD-10-CM | POA: Diagnosis not present

## 2017-04-29 DIAGNOSIS — N183 Chronic kidney disease, stage 3 (moderate): Secondary | ICD-10-CM | POA: Diagnosis not present

## 2017-04-29 DIAGNOSIS — R300 Dysuria: Secondary | ICD-10-CM | POA: Diagnosis not present

## 2017-04-29 DIAGNOSIS — N3000 Acute cystitis without hematuria: Secondary | ICD-10-CM | POA: Diagnosis not present

## 2017-04-29 DIAGNOSIS — B961 Klebsiella pneumoniae [K. pneumoniae] as the cause of diseases classified elsewhere: Secondary | ICD-10-CM | POA: Diagnosis not present

## 2017-05-01 DIAGNOSIS — L89322 Pressure ulcer of left buttock, stage 2: Secondary | ICD-10-CM | POA: Diagnosis not present

## 2017-05-01 DIAGNOSIS — R3915 Urgency of urination: Secondary | ICD-10-CM | POA: Diagnosis not present

## 2017-05-01 DIAGNOSIS — I5032 Chronic diastolic (congestive) heart failure: Secondary | ICD-10-CM | POA: Diagnosis not present

## 2017-05-01 DIAGNOSIS — B961 Klebsiella pneumoniae [K. pneumoniae] as the cause of diseases classified elsewhere: Secondary | ICD-10-CM | POA: Diagnosis not present

## 2017-05-01 DIAGNOSIS — M1990 Unspecified osteoarthritis, unspecified site: Secondary | ICD-10-CM | POA: Diagnosis not present

## 2017-05-01 DIAGNOSIS — I130 Hypertensive heart and chronic kidney disease with heart failure and stage 1 through stage 4 chronic kidney disease, or unspecified chronic kidney disease: Secondary | ICD-10-CM | POA: Diagnosis not present

## 2017-05-01 DIAGNOSIS — D539 Nutritional anemia, unspecified: Secondary | ICD-10-CM | POA: Diagnosis not present

## 2017-05-01 DIAGNOSIS — J209 Acute bronchitis, unspecified: Secondary | ICD-10-CM | POA: Diagnosis not present

## 2017-05-01 DIAGNOSIS — N183 Chronic kidney disease, stage 3 (moderate): Secondary | ICD-10-CM | POA: Diagnosis not present

## 2017-05-01 DIAGNOSIS — N952 Postmenopausal atrophic vaginitis: Secondary | ICD-10-CM | POA: Diagnosis not present

## 2017-05-01 DIAGNOSIS — N3000 Acute cystitis without hematuria: Secondary | ICD-10-CM | POA: Diagnosis not present

## 2017-05-01 DIAGNOSIS — N3020 Other chronic cystitis without hematuria: Secondary | ICD-10-CM | POA: Diagnosis not present

## 2017-05-01 DIAGNOSIS — E1122 Type 2 diabetes mellitus with diabetic chronic kidney disease: Secondary | ICD-10-CM | POA: Diagnosis not present

## 2017-05-04 DIAGNOSIS — D539 Nutritional anemia, unspecified: Secondary | ICD-10-CM | POA: Diagnosis not present

## 2017-05-04 DIAGNOSIS — J209 Acute bronchitis, unspecified: Secondary | ICD-10-CM | POA: Diagnosis not present

## 2017-05-04 DIAGNOSIS — N183 Chronic kidney disease, stage 3 (moderate): Secondary | ICD-10-CM | POA: Diagnosis not present

## 2017-05-04 DIAGNOSIS — B961 Klebsiella pneumoniae [K. pneumoniae] as the cause of diseases classified elsewhere: Secondary | ICD-10-CM | POA: Diagnosis not present

## 2017-05-04 DIAGNOSIS — R69 Illness, unspecified: Secondary | ICD-10-CM | POA: Diagnosis not present

## 2017-05-04 DIAGNOSIS — L89322 Pressure ulcer of left buttock, stage 2: Secondary | ICD-10-CM | POA: Diagnosis not present

## 2017-05-04 DIAGNOSIS — N3000 Acute cystitis without hematuria: Secondary | ICD-10-CM | POA: Diagnosis not present

## 2017-05-04 DIAGNOSIS — M1990 Unspecified osteoarthritis, unspecified site: Secondary | ICD-10-CM | POA: Diagnosis not present

## 2017-05-04 DIAGNOSIS — I130 Hypertensive heart and chronic kidney disease with heart failure and stage 1 through stage 4 chronic kidney disease, or unspecified chronic kidney disease: Secondary | ICD-10-CM | POA: Diagnosis not present

## 2017-05-04 DIAGNOSIS — E1122 Type 2 diabetes mellitus with diabetic chronic kidney disease: Secondary | ICD-10-CM | POA: Diagnosis not present

## 2017-05-04 DIAGNOSIS — I5032 Chronic diastolic (congestive) heart failure: Secondary | ICD-10-CM | POA: Diagnosis not present

## 2017-05-06 DIAGNOSIS — R410 Disorientation, unspecified: Secondary | ICD-10-CM | POA: Diagnosis not present

## 2017-05-06 DIAGNOSIS — Z6841 Body Mass Index (BMI) 40.0 and over, adult: Secondary | ICD-10-CM | POA: Diagnosis not present

## 2017-05-06 DIAGNOSIS — E1129 Type 2 diabetes mellitus with other diabetic kidney complication: Secondary | ICD-10-CM | POA: Diagnosis not present

## 2017-05-08 DIAGNOSIS — I5032 Chronic diastolic (congestive) heart failure: Secondary | ICD-10-CM | POA: Diagnosis not present

## 2017-05-08 DIAGNOSIS — M1990 Unspecified osteoarthritis, unspecified site: Secondary | ICD-10-CM | POA: Diagnosis not present

## 2017-05-08 DIAGNOSIS — N183 Chronic kidney disease, stage 3 (moderate): Secondary | ICD-10-CM | POA: Diagnosis not present

## 2017-05-08 DIAGNOSIS — L89322 Pressure ulcer of left buttock, stage 2: Secondary | ICD-10-CM | POA: Diagnosis not present

## 2017-05-08 DIAGNOSIS — J209 Acute bronchitis, unspecified: Secondary | ICD-10-CM | POA: Diagnosis not present

## 2017-05-08 DIAGNOSIS — E1122 Type 2 diabetes mellitus with diabetic chronic kidney disease: Secondary | ICD-10-CM | POA: Diagnosis not present

## 2017-05-08 DIAGNOSIS — B961 Klebsiella pneumoniae [K. pneumoniae] as the cause of diseases classified elsewhere: Secondary | ICD-10-CM | POA: Diagnosis not present

## 2017-05-08 DIAGNOSIS — I130 Hypertensive heart and chronic kidney disease with heart failure and stage 1 through stage 4 chronic kidney disease, or unspecified chronic kidney disease: Secondary | ICD-10-CM | POA: Diagnosis not present

## 2017-05-08 DIAGNOSIS — D539 Nutritional anemia, unspecified: Secondary | ICD-10-CM | POA: Diagnosis not present

## 2017-05-08 DIAGNOSIS — N3000 Acute cystitis without hematuria: Secondary | ICD-10-CM | POA: Diagnosis not present

## 2017-05-12 DIAGNOSIS — D539 Nutritional anemia, unspecified: Secondary | ICD-10-CM | POA: Diagnosis not present

## 2017-05-12 DIAGNOSIS — I130 Hypertensive heart and chronic kidney disease with heart failure and stage 1 through stage 4 chronic kidney disease, or unspecified chronic kidney disease: Secondary | ICD-10-CM | POA: Diagnosis not present

## 2017-05-12 DIAGNOSIS — B961 Klebsiella pneumoniae [K. pneumoniae] as the cause of diseases classified elsewhere: Secondary | ICD-10-CM | POA: Diagnosis not present

## 2017-05-12 DIAGNOSIS — N3000 Acute cystitis without hematuria: Secondary | ICD-10-CM | POA: Diagnosis not present

## 2017-05-12 DIAGNOSIS — M1990 Unspecified osteoarthritis, unspecified site: Secondary | ICD-10-CM | POA: Diagnosis not present

## 2017-05-12 DIAGNOSIS — J209 Acute bronchitis, unspecified: Secondary | ICD-10-CM | POA: Diagnosis not present

## 2017-05-12 DIAGNOSIS — N183 Chronic kidney disease, stage 3 (moderate): Secondary | ICD-10-CM | POA: Diagnosis not present

## 2017-05-12 DIAGNOSIS — E1122 Type 2 diabetes mellitus with diabetic chronic kidney disease: Secondary | ICD-10-CM | POA: Diagnosis not present

## 2017-05-12 DIAGNOSIS — L89322 Pressure ulcer of left buttock, stage 2: Secondary | ICD-10-CM | POA: Diagnosis not present

## 2017-05-12 DIAGNOSIS — I5032 Chronic diastolic (congestive) heart failure: Secondary | ICD-10-CM | POA: Diagnosis not present

## 2017-05-14 DIAGNOSIS — E1122 Type 2 diabetes mellitus with diabetic chronic kidney disease: Secondary | ICD-10-CM | POA: Diagnosis not present

## 2017-05-14 DIAGNOSIS — D539 Nutritional anemia, unspecified: Secondary | ICD-10-CM | POA: Diagnosis not present

## 2017-05-14 DIAGNOSIS — L89322 Pressure ulcer of left buttock, stage 2: Secondary | ICD-10-CM | POA: Diagnosis not present

## 2017-05-14 DIAGNOSIS — I5032 Chronic diastolic (congestive) heart failure: Secondary | ICD-10-CM | POA: Diagnosis not present

## 2017-05-14 DIAGNOSIS — M1990 Unspecified osteoarthritis, unspecified site: Secondary | ICD-10-CM | POA: Diagnosis not present

## 2017-05-14 DIAGNOSIS — J209 Acute bronchitis, unspecified: Secondary | ICD-10-CM | POA: Diagnosis not present

## 2017-05-14 DIAGNOSIS — N3000 Acute cystitis without hematuria: Secondary | ICD-10-CM | POA: Diagnosis not present

## 2017-05-14 DIAGNOSIS — B961 Klebsiella pneumoniae [K. pneumoniae] as the cause of diseases classified elsewhere: Secondary | ICD-10-CM | POA: Diagnosis not present

## 2017-05-14 DIAGNOSIS — N183 Chronic kidney disease, stage 3 (moderate): Secondary | ICD-10-CM | POA: Diagnosis not present

## 2017-05-14 DIAGNOSIS — I130 Hypertensive heart and chronic kidney disease with heart failure and stage 1 through stage 4 chronic kidney disease, or unspecified chronic kidney disease: Secondary | ICD-10-CM | POA: Diagnosis not present

## 2017-05-15 DIAGNOSIS — N3000 Acute cystitis without hematuria: Secondary | ICD-10-CM | POA: Diagnosis not present

## 2017-05-15 DIAGNOSIS — D539 Nutritional anemia, unspecified: Secondary | ICD-10-CM | POA: Diagnosis not present

## 2017-05-15 DIAGNOSIS — E1122 Type 2 diabetes mellitus with diabetic chronic kidney disease: Secondary | ICD-10-CM | POA: Diagnosis not present

## 2017-05-15 DIAGNOSIS — N183 Chronic kidney disease, stage 3 (moderate): Secondary | ICD-10-CM | POA: Diagnosis not present

## 2017-05-15 DIAGNOSIS — J209 Acute bronchitis, unspecified: Secondary | ICD-10-CM | POA: Diagnosis not present

## 2017-05-15 DIAGNOSIS — I5032 Chronic diastolic (congestive) heart failure: Secondary | ICD-10-CM | POA: Diagnosis not present

## 2017-05-15 DIAGNOSIS — B961 Klebsiella pneumoniae [K. pneumoniae] as the cause of diseases classified elsewhere: Secondary | ICD-10-CM | POA: Diagnosis not present

## 2017-05-15 DIAGNOSIS — M1990 Unspecified osteoarthritis, unspecified site: Secondary | ICD-10-CM | POA: Diagnosis not present

## 2017-05-15 DIAGNOSIS — I130 Hypertensive heart and chronic kidney disease with heart failure and stage 1 through stage 4 chronic kidney disease, or unspecified chronic kidney disease: Secondary | ICD-10-CM | POA: Diagnosis not present

## 2017-05-15 DIAGNOSIS — L89322 Pressure ulcer of left buttock, stage 2: Secondary | ICD-10-CM | POA: Diagnosis not present

## 2017-05-19 DIAGNOSIS — N183 Chronic kidney disease, stage 3 (moderate): Secondary | ICD-10-CM | POA: Diagnosis not present

## 2017-05-19 DIAGNOSIS — B961 Klebsiella pneumoniae [K. pneumoniae] as the cause of diseases classified elsewhere: Secondary | ICD-10-CM | POA: Diagnosis not present

## 2017-05-19 DIAGNOSIS — D539 Nutritional anemia, unspecified: Secondary | ICD-10-CM | POA: Diagnosis not present

## 2017-05-19 DIAGNOSIS — L89322 Pressure ulcer of left buttock, stage 2: Secondary | ICD-10-CM | POA: Diagnosis not present

## 2017-05-19 DIAGNOSIS — N3000 Acute cystitis without hematuria: Secondary | ICD-10-CM | POA: Diagnosis not present

## 2017-05-19 DIAGNOSIS — J209 Acute bronchitis, unspecified: Secondary | ICD-10-CM | POA: Diagnosis not present

## 2017-05-19 DIAGNOSIS — M1990 Unspecified osteoarthritis, unspecified site: Secondary | ICD-10-CM | POA: Diagnosis not present

## 2017-05-19 DIAGNOSIS — I5032 Chronic diastolic (congestive) heart failure: Secondary | ICD-10-CM | POA: Diagnosis not present

## 2017-05-19 DIAGNOSIS — I130 Hypertensive heart and chronic kidney disease with heart failure and stage 1 through stage 4 chronic kidney disease, or unspecified chronic kidney disease: Secondary | ICD-10-CM | POA: Diagnosis not present

## 2017-05-19 DIAGNOSIS — E1122 Type 2 diabetes mellitus with diabetic chronic kidney disease: Secondary | ICD-10-CM | POA: Diagnosis not present

## 2017-05-20 DIAGNOSIS — E1129 Type 2 diabetes mellitus with other diabetic kidney complication: Secondary | ICD-10-CM | POA: Diagnosis not present

## 2017-05-20 DIAGNOSIS — R410 Disorientation, unspecified: Secondary | ICD-10-CM | POA: Diagnosis not present

## 2017-05-20 DIAGNOSIS — R600 Localized edema: Secondary | ICD-10-CM | POA: Diagnosis not present

## 2017-05-20 DIAGNOSIS — N3020 Other chronic cystitis without hematuria: Secondary | ICD-10-CM | POA: Diagnosis not present

## 2017-05-20 DIAGNOSIS — I5032 Chronic diastolic (congestive) heart failure: Secondary | ICD-10-CM | POA: Diagnosis not present

## 2017-05-20 DIAGNOSIS — Z6841 Body Mass Index (BMI) 40.0 and over, adult: Secondary | ICD-10-CM | POA: Diagnosis not present

## 2017-05-20 DIAGNOSIS — K5730 Diverticulosis of large intestine without perforation or abscess without bleeding: Secondary | ICD-10-CM | POA: Diagnosis not present

## 2017-05-22 DIAGNOSIS — E1122 Type 2 diabetes mellitus with diabetic chronic kidney disease: Secondary | ICD-10-CM | POA: Diagnosis not present

## 2017-05-22 DIAGNOSIS — J209 Acute bronchitis, unspecified: Secondary | ICD-10-CM | POA: Diagnosis not present

## 2017-05-22 DIAGNOSIS — I5032 Chronic diastolic (congestive) heart failure: Secondary | ICD-10-CM | POA: Diagnosis not present

## 2017-05-22 DIAGNOSIS — I130 Hypertensive heart and chronic kidney disease with heart failure and stage 1 through stage 4 chronic kidney disease, or unspecified chronic kidney disease: Secondary | ICD-10-CM | POA: Diagnosis not present

## 2017-05-22 DIAGNOSIS — B961 Klebsiella pneumoniae [K. pneumoniae] as the cause of diseases classified elsewhere: Secondary | ICD-10-CM | POA: Diagnosis not present

## 2017-05-22 DIAGNOSIS — N3000 Acute cystitis without hematuria: Secondary | ICD-10-CM | POA: Diagnosis not present

## 2017-05-22 DIAGNOSIS — L89322 Pressure ulcer of left buttock, stage 2: Secondary | ICD-10-CM | POA: Diagnosis not present

## 2017-05-22 DIAGNOSIS — D539 Nutritional anemia, unspecified: Secondary | ICD-10-CM | POA: Diagnosis not present

## 2017-05-22 DIAGNOSIS — N183 Chronic kidney disease, stage 3 (moderate): Secondary | ICD-10-CM | POA: Diagnosis not present

## 2017-05-22 DIAGNOSIS — M1990 Unspecified osteoarthritis, unspecified site: Secondary | ICD-10-CM | POA: Diagnosis not present

## 2017-05-25 ENCOUNTER — Ambulatory Visit: Payer: Medicare Other | Admitting: Internal Medicine

## 2017-05-26 DIAGNOSIS — I130 Hypertensive heart and chronic kidney disease with heart failure and stage 1 through stage 4 chronic kidney disease, or unspecified chronic kidney disease: Secondary | ICD-10-CM | POA: Diagnosis not present

## 2017-05-26 DIAGNOSIS — J209 Acute bronchitis, unspecified: Secondary | ICD-10-CM | POA: Diagnosis not present

## 2017-05-26 DIAGNOSIS — B961 Klebsiella pneumoniae [K. pneumoniae] as the cause of diseases classified elsewhere: Secondary | ICD-10-CM | POA: Diagnosis not present

## 2017-05-26 DIAGNOSIS — N183 Chronic kidney disease, stage 3 (moderate): Secondary | ICD-10-CM | POA: Diagnosis not present

## 2017-05-26 DIAGNOSIS — L89322 Pressure ulcer of left buttock, stage 2: Secondary | ICD-10-CM | POA: Diagnosis not present

## 2017-05-26 DIAGNOSIS — M1990 Unspecified osteoarthritis, unspecified site: Secondary | ICD-10-CM | POA: Diagnosis not present

## 2017-05-26 DIAGNOSIS — I5032 Chronic diastolic (congestive) heart failure: Secondary | ICD-10-CM | POA: Diagnosis not present

## 2017-05-26 DIAGNOSIS — N3000 Acute cystitis without hematuria: Secondary | ICD-10-CM | POA: Diagnosis not present

## 2017-05-26 DIAGNOSIS — E1122 Type 2 diabetes mellitus with diabetic chronic kidney disease: Secondary | ICD-10-CM | POA: Diagnosis not present

## 2017-05-26 DIAGNOSIS — D539 Nutritional anemia, unspecified: Secondary | ICD-10-CM | POA: Diagnosis not present

## 2017-06-01 DIAGNOSIS — N3020 Other chronic cystitis without hematuria: Secondary | ICD-10-CM | POA: Diagnosis not present

## 2017-06-01 DIAGNOSIS — N952 Postmenopausal atrophic vaginitis: Secondary | ICD-10-CM | POA: Diagnosis not present

## 2017-06-01 DIAGNOSIS — R3915 Urgency of urination: Secondary | ICD-10-CM | POA: Diagnosis not present

## 2017-06-02 DIAGNOSIS — J209 Acute bronchitis, unspecified: Secondary | ICD-10-CM | POA: Diagnosis not present

## 2017-06-02 DIAGNOSIS — I130 Hypertensive heart and chronic kidney disease with heart failure and stage 1 through stage 4 chronic kidney disease, or unspecified chronic kidney disease: Secondary | ICD-10-CM | POA: Diagnosis not present

## 2017-06-02 DIAGNOSIS — B961 Klebsiella pneumoniae [K. pneumoniae] as the cause of diseases classified elsewhere: Secondary | ICD-10-CM | POA: Diagnosis not present

## 2017-06-02 DIAGNOSIS — E1122 Type 2 diabetes mellitus with diabetic chronic kidney disease: Secondary | ICD-10-CM | POA: Diagnosis not present

## 2017-06-02 DIAGNOSIS — D539 Nutritional anemia, unspecified: Secondary | ICD-10-CM | POA: Diagnosis not present

## 2017-06-02 DIAGNOSIS — N3000 Acute cystitis without hematuria: Secondary | ICD-10-CM | POA: Diagnosis not present

## 2017-06-02 DIAGNOSIS — M1990 Unspecified osteoarthritis, unspecified site: Secondary | ICD-10-CM | POA: Diagnosis not present

## 2017-06-02 DIAGNOSIS — L89322 Pressure ulcer of left buttock, stage 2: Secondary | ICD-10-CM | POA: Diagnosis not present

## 2017-06-02 DIAGNOSIS — I5032 Chronic diastolic (congestive) heart failure: Secondary | ICD-10-CM | POA: Diagnosis not present

## 2017-06-02 DIAGNOSIS — N183 Chronic kidney disease, stage 3 (moderate): Secondary | ICD-10-CM | POA: Diagnosis not present

## 2017-06-08 ENCOUNTER — Other Ambulatory Visit: Payer: Self-pay | Admitting: Internal Medicine

## 2017-06-10 ENCOUNTER — Ambulatory Visit: Payer: Medicare HMO | Admitting: Physician Assistant

## 2017-06-10 ENCOUNTER — Encounter: Payer: Self-pay | Admitting: Physician Assistant

## 2017-06-10 VITALS — BP 118/62 | HR 74 | Ht 65.0 in | Wt 227.0 lb

## 2017-06-10 DIAGNOSIS — N183 Chronic kidney disease, stage 3 unspecified: Secondary | ICD-10-CM

## 2017-06-10 DIAGNOSIS — I110 Hypertensive heart disease with heart failure: Secondary | ICD-10-CM | POA: Diagnosis not present

## 2017-06-10 DIAGNOSIS — I5032 Chronic diastolic (congestive) heart failure: Secondary | ICD-10-CM

## 2017-06-10 NOTE — Patient Instructions (Addendum)
Your physician recommends that you continue on your current medications as directed. Please refer to the Current Medication list given to you today.   Your physician recommends that you schedule a follow-up appointment in: [place]

## 2017-06-10 NOTE — Progress Notes (Signed)
Cardiology Office Note:    Date:  06/10/2017   ID:  Brandi [facility], Brandi Melton July 10, 1930, MRN [id number]  PCP:  Brandi Pao, MD  Cardiologist:  Brandi Carnes, MD   Referring MD: Brandi Pao, MD   Chief Complaint  Patient presents with  . Follow-up    CHF    History of Present Illness:    Brandi Melton is a 84 y.o. female resident of [facility] with chronic diastolic heart failure, chronic kidney disease, hypertension, hyperlipidemia, sleep apnea, rheumatoid arthritis.  She has been admitted several times in the past with decompensated heart failure.  Most recently, she was admitted in November 2018 for decompensated heart failure and again in December 2018 for decompensated heart failure in the setting of UTI.  Brandi Melton returns for follow up.  She is here with her daughter.  She was quite ill after her admission for UTI in Dec 2018 and lost ~20 lbs.  She went to urology and her UTI is finally resolved.  She has been doing much better.  Her breathing is fairly stable.  She sleeps in a recliner chronically.  She has not had any paroxysmal nocturnal dyspnea.  Her leg swelling is stable and she wears compression stockings.  She has not had chest pain or syncope.    Prior CV studies:   The following studies were reviewed today:  Echo 04/05/17 Mild LVH, EF 34-74, grade 1 diastolic dysfunction, MAC, mild MR  Echo 3/17 Vigorous LVF, EF 65-70%, normal wall motion, grade 1 diastolic dysfunction, severe MAC, no regurgitation, normal RVSF  LHC 2/15 ([place], [place]) EF 65-70% LCx minimal mid disease LAD minimal disease RCA minimal disease PAP 50/16 mmHg, mean PAP 26 mmHg  Past Medical History:  Diagnosis Date  . Arthritis    "hips before they were replaced, right knee now" (11/09/2015)  . Chronic diastolic (congestive) heart failure (HCC)    a. echo 06/2015: EF 65-70%, Grade 1 DD, mitral valve calcification.  . CKD (chronic kidney disease), stage III ([place])   .  Depression   . Dyspnea   . H/O cardiac catheterization    a. h/o cardiac catheterization 05/2013 in [place] showing only minimal CAD with increased filling pressure.  . Hyperlipemia   . Hypertensive heart disease   . Morbid obesity ([place])   . Palsy, Bell's   . RA (rheumatoid arthritis) ([place])   . Sleep apnea    "suppose to wear mask; couldn't" (11/09/2015)  . Type II diabetes mellitus ([place])     Past Surgical History:  Procedure Laterality Date  . APPENDECTOMY  1966  . CARDIAC CATHETERIZATION  05/2013  . CARPAL TUNNEL RELEASE Right 08/08/2014   Procedure: OPEN CARPAL TUNNEL RELEASE;  Surgeon: Roseanne Kaufman, MD;  Location: [facility];  Service: Orthopedics;  Laterality: Right;  . CATARACT EXTRACTION W/ INTRAOCULAR LENS  IMPLANT, BILATERAL Bilateral   . CHOLECYSTECTOMY OPEN  1966  . COLONOSCOPY    . FRACTURE SURGERY    . JOINT REPLACEMENT    . KNEE ARTHROPLASTY Left   . ORIF WRIST FRACTURE Right 08/08/2014   Procedure: Right OPEN REDUCTION INTERNAL FIXATION (ORIF) WRIST FRACTURE WITH REPAIR AND RECONST/ALLOGRAFT AND BONE GRAFT;  Surgeon: Roseanne Kaufman, MD;  Location: [facility];  Service: Orthopedics;  Laterality: Right;  . TOTAL HIP ARTHROPLASTY Bilateral     Current Medications: Current Meds  Medication Sig  . allopurinol (ZYLOPRIM) 300 MG tablet Take 300 mg by mouth at bedtime.   Marland Kitchen aspirin  EC 81 MG tablet Take 81 mg by mouth daily.  . benzonatate (TESSALON) 200 MG capsule Take 1 capsule (200 mg total) by mouth 3 (three) times daily.  . calcium carbonate (TUMS EX) 750 MG chewable tablet Chew 1 tablet by mouth daily.  . carvedilol (COREG CR) 80 MG 24 hr capsule TAKE 1 CAPSULE BY MOUTH  DAILY  . chlorhexidine (PERIDEX) 0.12 % solution Use as directed 5 mLs in the mouth or throat daily. Swish and spit  . chlorpheniramine-HYDROcodone (TUSSIONEX) 10-8 MG/5ML SUER Take 5 mLs by mouth every 12 (twelve) hours as needed for cough.  . cholecalciferol (VITAMIN D) 1000 units tablet Take 3,000 Units by  mouth daily.  Marland Kitchen ezetimibe-simvastatin (VYTORIN) 10-40 MG tablet TAKE 1 TABLET BY MOUTH  DAILY  . folic acid (FOLVITE) 010 MCG tablet Take 400 mcg by mouth daily.  Marland Kitchen glucose 4 GM chewable tablet Chew 1 tablet by mouth once as needed for low blood sugar.  . insulin aspart (NOVOLOG FLEXPEN) 100 UNIT/ML FlexPen Inject 45 Units into the skin See admin instructions. Inject 45 units subcutaneously two to three times daily with meals  . Insulin Detemir (LEVEMIR FLEXTOUCH) 100 UNIT/ML Pen Inject 65 Units into the skin 2 (two) times daily.  . magnesium oxide (MAG-OX) 400 MG tablet Take 1 tablet (400 mg total) by mouth daily.  . potassium chloride SA (K-DUR,KLOR-CON) 20 MEQ tablet TAKE 1 TABLET(20 MEQ) BY MOUTH DAILY  . predniSONE (DELTASONE) 10 MG tablet 34m po qday x 1 day then 345mpo qday x 1 day then 2057mo qday x 1 day then 71m48m qday x 1 day for bronchitis  . torsemide (DEMADEX) 20 MG tablet Take 40 mg by mouth daily.  . Vitamin D, Ergocalciferol, (DRISDOL) 50000 UNITS CAPS capsule Take 50,000 Units by mouth every Wednesday.      Allergies:   Patient has no known allergies.   Social History   Tobacco Use  . Smoking status: Never Smoker  . Smokeless tobacco: Never Used  Substance Use Topics  . Alcohol use: Yes    Alcohol/week: 0.0 oz    Comment: 11/09/2015 "might have glass of champaign on special occasion"  . Drug use: No     Family Hx: The patient's family history includes Stroke in her mother; Sudden death in her mother. There is no history of Hypertension.  ROS:   Please see the history of present illness.    ROS All other systems reviewed and are negative.   EKGs/Labs/Other Test Reviewed:    EKG:  EKG is not ordered today.    Recent Labs: 01/05/2017: NT-Pro BNP 133 02/23/2017: TSH 3.068 04/04/2017: B Natriuretic Peptide 215.4 04/11/2017: ALT 46; BUN 82; Creatinine, Ser 1.75; Hemoglobin 12.3; Platelets 238; Potassium 3.3; Sodium 138   Obtained from [facility]  tool: Creatinine, Serum  1.500 mg/  05/06/2017  Recent Lipid Panel No results found for: CHOL, TRIG, HDL, CHOLHDL, LDLCALC, LDLDIRECT  Physical Exam:    VS:  Ht _0  (1.651 m)   Wt 227 lb (103 kg)   BMI 37.77 kg/m     Wt Readings from Last 3 Encounters:  06/10/17 227 lb (103 kg)  04/11/17 221 lb (100.2 kg)  03/01/17 231 lb 0.7 oz (104.8 kg)     Physical Exam  Constitutional: She is oriented to person, place, and time. She appears well-developed and well-nourished. No distress.  HENT:  Head: Normocephalic and atraumatic.  Neck: No JVD (at 90 degrees) present.  Cardiovascular: Normal rate  and regular rhythm.  No murmur heard. Pulmonary/Chest: Effort normal. She has no rales.  Abdominal: Soft.  Musculoskeletal: She exhibits edema (trace-1+ bilat LE edema with compression stockings in place).  Neurological: She is alert and oriented to person, place, and time.  Skin: Skin is warm and dry.    ASSESSMENT & PLAN:    #1.  Chronic diastolic CHF (congestive heart failure) (HCC) EF 55-60 with mild diastolic dysfunction by echocardiogram December 2018.  She is NYHA 2 b-3.  Her volume is fairly stable.  She feels better than she has in quite some time.  I have encouraged her to try to weigh as much as she can and to take an extra torsemide if she gains more than 3 pounds in a day or 5 pounds in a week.  #2.  Hypertensive heart disease with chronic diastolic congestive heart failure ([place]) The patient's blood pressure is controlled on her current regimen.  Continue current therapy.   #3.  CKD (chronic kidney disease), stage III ([place]) Most recent creatinine obtained with primary care stable.   Dispo:  Return in about 3 months (around 09/07/2017) for Routine Follow Up, w/ Dr. Harrington Challenger, or Richardson Dopp, PA-C.   Medication Adjustments/Labs and Tests Ordered: Current medicines are reviewed at length with the patient today.  Concerns regarding medicines are outlined above.  Tests Ordered: No  orders of the defined types were placed in this encounter.  Medication Changes: No orders of the defined types were placed in this encounter.   Signed, Richardson Dopp, PA-C  06/10/2017 4:24 PM    [facility] [address], [place], [place]  [address] Phone: [phone]; Fax: [phone]

## 2017-06-15 DIAGNOSIS — M1990 Unspecified osteoarthritis, unspecified site: Secondary | ICD-10-CM | POA: Diagnosis not present

## 2017-06-15 DIAGNOSIS — N3000 Acute cystitis without hematuria: Secondary | ICD-10-CM | POA: Diagnosis not present

## 2017-06-15 DIAGNOSIS — E1122 Type 2 diabetes mellitus with diabetic chronic kidney disease: Secondary | ICD-10-CM | POA: Diagnosis not present

## 2017-06-15 DIAGNOSIS — I5032 Chronic diastolic (congestive) heart failure: Secondary | ICD-10-CM | POA: Diagnosis not present

## 2017-06-15 DIAGNOSIS — L89322 Pressure ulcer of left buttock, stage 2: Secondary | ICD-10-CM | POA: Diagnosis not present

## 2017-06-15 DIAGNOSIS — J209 Acute bronchitis, unspecified: Secondary | ICD-10-CM | POA: Diagnosis not present

## 2017-06-15 DIAGNOSIS — N183 Chronic kidney disease, stage 3 (moderate): Secondary | ICD-10-CM | POA: Diagnosis not present

## 2017-06-15 DIAGNOSIS — D539 Nutritional anemia, unspecified: Secondary | ICD-10-CM | POA: Diagnosis not present

## 2017-06-15 DIAGNOSIS — B961 Klebsiella pneumoniae [K. pneumoniae] as the cause of diseases classified elsewhere: Secondary | ICD-10-CM | POA: Diagnosis not present

## 2017-06-15 DIAGNOSIS — I130 Hypertensive heart and chronic kidney disease with heart failure and stage 1 through stage 4 chronic kidney disease, or unspecified chronic kidney disease: Secondary | ICD-10-CM | POA: Diagnosis not present

## 2017-06-18 DIAGNOSIS — R69 Illness, unspecified: Secondary | ICD-10-CM | POA: Diagnosis not present

## 2017-08-12 DIAGNOSIS — R69 Illness, unspecified: Secondary | ICD-10-CM | POA: Diagnosis not present

## 2017-08-18 ENCOUNTER — Other Ambulatory Visit: Payer: Self-pay | Admitting: Adult Health

## 2017-08-18 DIAGNOSIS — I5032 Chronic diastolic (congestive) heart failure: Secondary | ICD-10-CM

## 2017-08-18 DIAGNOSIS — I10 Essential (primary) hypertension: Secondary | ICD-10-CM

## 2017-08-19 ENCOUNTER — Other Ambulatory Visit: Payer: Self-pay | Admitting: Internal Medicine

## 2017-08-24 DIAGNOSIS — Z794 Long term (current) use of insulin: Secondary | ICD-10-CM | POA: Diagnosis not present

## 2017-08-24 DIAGNOSIS — G4733 Obstructive sleep apnea (adult) (pediatric): Secondary | ICD-10-CM | POA: Diagnosis not present

## 2017-08-24 DIAGNOSIS — E1129 Type 2 diabetes mellitus with other diabetic kidney complication: Secondary | ICD-10-CM | POA: Diagnosis not present

## 2017-08-24 DIAGNOSIS — E1165 Type 2 diabetes mellitus with hyperglycemia: Secondary | ICD-10-CM | POA: Diagnosis not present

## 2017-08-24 DIAGNOSIS — M109 Gout, unspecified: Secondary | ICD-10-CM | POA: Diagnosis not present

## 2017-08-24 DIAGNOSIS — E1151 Type 2 diabetes mellitus with diabetic peripheral angiopathy without gangrene: Secondary | ICD-10-CM | POA: Diagnosis not present

## 2017-08-24 DIAGNOSIS — N184 Chronic kidney disease, stage 4 (severe): Secondary | ICD-10-CM | POA: Diagnosis not present

## 2017-08-24 DIAGNOSIS — D692 Other nonthrombocytopenic purpura: Secondary | ICD-10-CM | POA: Diagnosis not present

## 2017-08-24 DIAGNOSIS — E7800 Pure hypercholesterolemia, unspecified: Secondary | ICD-10-CM | POA: Diagnosis not present

## 2017-08-24 DIAGNOSIS — I10 Essential (primary) hypertension: Secondary | ICD-10-CM | POA: Diagnosis not present

## 2017-09-01 DIAGNOSIS — R69 Illness, unspecified: Secondary | ICD-10-CM | POA: Diagnosis not present

## 2017-09-08 ENCOUNTER — Encounter (HOSPITAL_COMMUNITY): Payer: Self-pay | Admitting: Emergency Medicine

## 2017-09-08 ENCOUNTER — Emergency Department (HOSPITAL_COMMUNITY)
Admission: EM | Admit: 2017-09-08 | Discharge: 2017-09-08 | Disposition: A | Discharge status: Home / Self Care | Payer: Medicare HMO | Attending: Emergency Medicine | Admitting: Emergency Medicine

## 2017-09-08 DIAGNOSIS — E11649 Type 2 diabetes mellitus with hypoglycemia without coma: Secondary | ICD-10-CM | POA: Diagnosis not present

## 2017-09-08 DIAGNOSIS — Z7982 Long term (current) use of aspirin: Secondary | ICD-10-CM | POA: Insufficient documentation

## 2017-09-08 DIAGNOSIS — Z794 Long term (current) use of insulin: Secondary | ICD-10-CM | POA: Insufficient documentation

## 2017-09-08 DIAGNOSIS — Z96652 Presence of left artificial knee joint: Secondary | ICD-10-CM | POA: Diagnosis not present

## 2017-09-08 DIAGNOSIS — I130 Hypertensive heart and chronic kidney disease with heart failure and stage 1 through stage 4 chronic kidney disease, or unspecified chronic kidney disease: Secondary | ICD-10-CM | POA: Diagnosis not present

## 2017-09-08 DIAGNOSIS — F329 Major depressive disorder, single episode, unspecified: Secondary | ICD-10-CM | POA: Insufficient documentation

## 2017-09-08 DIAGNOSIS — N183 Chronic kidney disease, stage 3 (moderate): Secondary | ICD-10-CM | POA: Insufficient documentation

## 2017-09-08 DIAGNOSIS — R69 Illness, unspecified: Secondary | ICD-10-CM | POA: Diagnosis not present

## 2017-09-08 DIAGNOSIS — E162 Hypoglycemia, unspecified: Secondary | ICD-10-CM

## 2017-09-08 DIAGNOSIS — I5032 Chronic diastolic (congestive) heart failure: Secondary | ICD-10-CM | POA: Insufficient documentation

## 2017-09-08 DIAGNOSIS — Z9049 Acquired absence of other specified parts of digestive tract: Secondary | ICD-10-CM | POA: Diagnosis not present

## 2017-09-08 DIAGNOSIS — Z96643 Presence of artificial hip joint, bilateral: Secondary | ICD-10-CM | POA: Insufficient documentation

## 2017-09-08 DIAGNOSIS — E1122 Type 2 diabetes mellitus with diabetic chronic kidney disease: Secondary | ICD-10-CM | POA: Diagnosis not present

## 2017-09-08 DIAGNOSIS — Z79899 Other long term (current) drug therapy: Secondary | ICD-10-CM | POA: Diagnosis not present

## 2017-09-08 DIAGNOSIS — R0902 Hypoxemia: Secondary | ICD-10-CM | POA: Diagnosis not present

## 2017-09-08 LAB — URINALYSIS, ROUTINE W REFLEX MICROSCOPIC
Bacteria, UA: NONE SEEN
Bilirubin Urine: NEGATIVE
Glucose, UA: NEGATIVE mg/dL
Ketones, ur: NEGATIVE mg/dL
Nitrite: NEGATIVE
Protein, ur: NEGATIVE mg/dL
Specific Gravity, Urine: 1.005 (ref 1.005–1.030)
pH: 8 (ref 5.0–8.0)

## 2017-09-08 LAB — CBC WITH DIFFERENTIAL/PLATELET
Abs Immature Granulocytes: 0.1 10*3/uL (ref 0.0–0.1)
Basophils Absolute: 0 10*3/uL (ref 0.0–0.1)
Basophils Relative: 0 %
Eosinophils Absolute: 0.3 10*3/uL (ref 0.0–0.7)
Eosinophils Relative: 3 %
HCT: 41.9 % (ref 36.0–46.0)
Hemoglobin: 12.8 g/dL (ref 12.0–15.0)
Immature Granulocytes: 1 %
Lymphocytes Relative: 33 %
Lymphs Abs: 2.9 10*3/uL (ref 0.7–4.0)
MCH: 29.6 pg (ref 26.0–34.0)
MCHC: 30.5 g/dL (ref 30.0–36.0)
MCV: 96.8 fL (ref 78.0–100.0)
Monocytes Absolute: 0.9 10*3/uL (ref 0.1–1.0)
Monocytes Relative: 10 %
Neutro Abs: 4.6 10*3/uL (ref 1.7–7.7)
Neutrophils Relative %: 53 %
Platelets: 207 10*3/uL (ref 150–400)
RBC: 4.33 MIL/uL (ref 3.87–5.11)
RDW: 15.6 % — ABNORMAL HIGH (ref 11.5–15.5)
WBC: 8.8 10*3/uL (ref 4.0–10.5)

## 2017-09-08 LAB — BASIC METABOLIC PANEL
Anion gap: 10 (ref 5–15)
BUN: 22 mg/dL — ABNORMAL HIGH (ref 6–20)
CO2: 29 mmol/L (ref 22–32)
Calcium: 9.1 mg/dL (ref 8.9–10.3)
Chloride: 104 mmol/L (ref 101–111)
Creatinine, Ser: 1.41 mg/dL — ABNORMAL HIGH (ref 0.44–1.00)
GFR calc Af Amer: 38 mL/min — ABNORMAL LOW (ref >60)
GFR calc non Af Amer: 33 mL/min — ABNORMAL LOW (ref >60)
Glucose, Bld: 132 mg/dL — ABNORMAL HIGH (ref 65–99)
Potassium: 5.5 mmol/L — ABNORMAL HIGH (ref 3.5–5.1)
Sodium: 143 mmol/L (ref 135–145)

## 2017-09-08 LAB — CBG MONITORING, ED
Glucose-Capillary: 95 mg/dL (ref 65–99)
Glucose-Capillary: 173 mg/dL — ABNORMAL HIGH (ref 65–99)

## 2017-09-08 NOTE — ED Notes (Signed)
Brandi [facility], son-in-law: [phone] Ernestene Mention, Daughter: [phone]  Family will pick up patient if disposition is discharge. Please call with updates.

## 2017-09-08 NOTE — ED Triage Notes (Signed)
Patient arrived from [facility]. Patient was in an event and was found to not respond. EMS found patient's CBG to be 27 upon arrival, EMS gave 25 D50.

## 2017-09-08 NOTE — ED Notes (Signed)
Pt requesting EKG strip.Pt notified copy of strip is placed in chart via medical records.

## 2017-09-08 NOTE — ED Notes (Signed)
Dinner tray arrived 

## 2017-09-08 NOTE — ED Notes (Signed)
ED Provider at bedside. 

## 2017-09-08 NOTE — ED Notes (Signed)
Dinner tray ordered.

## 2017-09-08 NOTE — ED Notes (Signed)
Family to transport pt back to facility. Family at bedside and given discharge instructions.

## 2017-09-08 NOTE — ED Provider Notes (Signed)
[facility] EMERGENCY DEPARTMENT Provider Note   CSN: [id number] Arrival date & time: 09/08/17  1833     History   Chief Complaint Chief Complaint  Patient presents with  . Hypoglycemia    HPI Brandi [facility] is a 84 y.o. female.  HPI Patient presents to the emergency department with hypoglycemic episode following her NovoLog injection.  Patient states that she normally eats dinner at 430 and gave herself the NovoLog injection at 415.  But she was going to a special charity dinner this evening and was not duty until 515.  The patient then became hypoglycemic about 30 minutes later.  EMS arrived and found her to be hypoglycemic and confused with a blood sugar 27.  They did give her D50 and something to eat at the facility but brought her into the emergency department for further evaluation.  Patient states she has no complaints at this time.  Patient states that she has had some issues with her blood sugars over the last few months.  The patient denies chest pain, shortness of breath, headache,blurred vision, neck pain, fever, cough, weakness, numbness, dizziness, anorexia, edema, abdominal pain, nausea, vomiting, diarrhea, rash, back pain, dysuria, hematemesis, bloody stool, near syncope, or syncope. Past Medical History:  Diagnosis Date  . Arthritis    "hips before they were replaced, right knee now" (11/09/2015)  . Chronic diastolic (congestive) heart failure (HCC)    a. echo 06/2015: EF 65-70%, Grade 1 DD, mitral valve calcification.  . CKD (chronic kidney disease), stage III ([place])   . Depression   . Dyspnea   . H/O cardiac catheterization    a. h/o cardiac catheterization 05/2013 in [place] showing only minimal CAD with increased filling pressure.  . Hyperlipemia   . Hypertensive heart disease   . Morbid obesity ([place])   . Palsy, Bell's   . RA (rheumatoid arthritis) ([place])   . Sleep apnea    "suppose to wear mask; couldn't" (11/09/2015)  . Type II diabetes  [facility])     Patient Active Problem List   Diagnosis Date Noted  . Gout 02/19/2016  . H/O cardiac catheterization   . Hypertensive heart disease with CHF (congestive heart failure) ([place])   . Hypoglycemia due to insulin   . CKD (chronic kidney disease), stage III ([place])   . Chronic diastolic CHF (congestive heart failure) ([place])   . Morbid obesity ([place])   . Hypertension   . Hyperlipemia   . Sleep apnea   . Palsy, Bell's   . Diabetes mellitus type 2, insulin dependent ([place]) 08/10/2014  . Distal radius fracture 08/08/2014    Past Surgical History:  Procedure Laterality Date  . APPENDECTOMY  1966  . CARDIAC CATHETERIZATION  05/2013  . CARPAL TUNNEL RELEASE Right 08/08/2014   Procedure: OPEN CARPAL TUNNEL RELEASE;  Surgeon: Roseanne Kaufman, MD;  Location: [facility];  Service: Orthopedics;  Laterality: Right;  . CATARACT EXTRACTION W/ INTRAOCULAR LENS  IMPLANT, BILATERAL Bilateral   . CHOLECYSTECTOMY OPEN  1966  . COLONOSCOPY    . FRACTURE SURGERY    . JOINT REPLACEMENT    . KNEE ARTHROPLASTY Left   . ORIF WRIST FRACTURE Right 08/08/2014   Procedure: Right OPEN REDUCTION INTERNAL FIXATION (ORIF) WRIST FRACTURE WITH REPAIR AND RECONST/ALLOGRAFT AND BONE GRAFT;  Surgeon: Roseanne Kaufman, MD;  Location: [facility];  Service: Orthopedics;  Laterality: Right;  . TOTAL HIP ARTHROPLASTY Bilateral      OB History   None  Home Medications    Prior to Admission medications   Medication Sig Start Date End Date Taking? Authorizing Provider  allopurinol (ZYLOPRIM) 300 MG tablet Take 300 mg by mouth at bedtime.  02/13/16   [provider]  aspirin EC 81 MG tablet Take 81 mg by mouth daily.    [provider]  benzonatate (TESSALON) 200 MG capsule Take 1 capsule (200 mg total) by mouth 3 (three) times daily. 04/11/17   Jani Gravel, MD  calcium carbonate (TUMS EX) 750 MG chewable tablet Chew 1 tablet by mouth daily.    [provider]  carvedilol (COREG CR) 80 MG 24  hr capsule TAKE 1 CAPSULE BY MOUTH  DAILY 08/20/17   Fay Records, MD  chlorhexidine (PERIDEX) 0.12 % solution Use as directed 5 mLs in the mouth or throat daily. Swish and spit 01/17/15   [provider]  chlorpheniramine-HYDROcodone (TUSSIONEX) 10-8 MG/5ML SUER Take 5 mLs by mouth every 12 (twelve) hours as needed for cough. 04/11/17   Jani Gravel, MD  cholecalciferol (VITAMIN D) 1000 units tablet Take 3,000 Units by mouth daily.    [provider]  ezetimibe-simvastatin (VYTORIN) 10-40 MG tablet TAKE 1 TABLET BY MOUTH  DAILY 08/20/17   Fay Records, MD  folic acid (FOLVITE) 323 MCG tablet Take 400 mcg by mouth daily.    [provider]  glucose 4 GM chewable tablet Chew 1 tablet by mouth once as needed for low blood sugar.    [provider]  insulin aspart (NOVOLOG FLEXPEN) 100 UNIT/ML FlexPen Inject 45 Units into the skin See admin instructions. Inject 45 units subcutaneously two to three times daily with meals    [provider]  Insulin Detemir (LEVEMIR FLEXTOUCH) 100 UNIT/ML Pen Inject 65 Units into the skin 2 (two) times daily.    [provider]  magnesium oxide (MAG-OX) 400 MG tablet Take 1 tablet (400 mg total) by mouth daily. 07/07/16   Fay Records, MD  potassium chloride SA (K-DUR,KLOR-CON) 20 MEQ tablet TAKE 1 TABLET(20 MEQ) BY MOUTH DAILY 04/06/17   Richardson Dopp T, PA-C  predniSONE (DELTASONE) 10 MG tablet 40mg  po qday x 1 day then 30mg  po qday x 1 day then 20mg  po qday x 1 day then 10mg  po qday x 1 day for bronchitis 04/12/17   Jani Gravel, MD  torsemide (DEMADEX) 20 MG tablet Take 40 mg by mouth daily.    [provider]  torsemide (DEMADEX) 20 MG tablet TAKE 3 TABLETS BY MOUTH  DAILY 08/19/17   Lendon Colonel, NP  Vitamin D, Ergocalciferol, (DRISDOL) 50000 UNITS CAPS capsule Take 50,000 Units by mouth every Wednesday.     [provider]    Family History Family History  Problem Relation Age of Onset  .  Sudden death Mother   . Stroke Mother   . Hypertension Neg Hx     Social History Social History   Tobacco Use  . Smoking status: Never Smoker  . Smokeless tobacco: Never Used  Substance Use Topics  . Alcohol use: Yes    Alcohol/week: 0.0 oz    Comment: 11/09/2015 "might have glass of champaign on special occasion"  . Drug use: No     Allergies   Patient has no known allergies.   Review of Systems Review of Systems All other systems negative except as documented in the HPI. All pertinent positives and negatives as reviewed in the HPI.  Physical Exam Updated Vital Signs BP Marland Kitchen)  168/82   Pulse 62   Temp 98 F (36.7 C) (Oral)   Resp 15   SpO2 97%   Physical Exam  Constitutional: She is oriented to person, place, and time. She appears well-developed and well-nourished. No distress.  HENT:  Head: Normocephalic and atraumatic.  Mouth/Throat: Oropharynx is clear and moist.  Eyes: Pupils are equal, round, and reactive to light.  Neck: Normal range of motion. Neck supple.  Cardiovascular: Normal rate, regular rhythm and normal heart sounds. Exam reveals no gallop and no friction rub.  No murmur heard. Pulmonary/Chest: Effort normal and breath sounds normal. No respiratory distress. She has no wheezes.  Abdominal: Soft. Bowel sounds are normal. She exhibits no distension. There is no tenderness.  Neurological: She is alert and oriented to person, place, and time. She exhibits normal muscle tone. Coordination normal.  Skin: Skin is warm and dry. Capillary refill takes less than 2 seconds. No rash noted. No erythema.  Psychiatric: She has a normal mood and affect. Her behavior is normal.  Nursing note and vitals reviewed.    ED Treatments / Results  Labs (all labs ordered are listed, but only abnormal results are displayed) Labs Reviewed  CBC WITH DIFFERENTIAL/PLATELET - Abnormal; Notable for the following components:      Result Value   RDW 15.6 (*)    All other  components within normal limits  BASIC METABOLIC PANEL - Abnormal; Notable for the following components:   Potassium 5.5 (*)    Glucose, Bld 132 (*)    BUN 22 (*)    Creatinine, Ser 1.41 (*)    GFR calc non Af Amer 33 (*)    GFR calc Af Amer 38 (*)    All other components within normal limits  URINALYSIS, ROUTINE W REFLEX MICROSCOPIC  CBG MONITORING, ED    EKG None  Radiology No results found.  Procedures Procedures (including critical care time)  Medications Ordered in ED Medications - No data to display   Initial Impression / Assessment and Plan / ED Course  I have reviewed the triage vital signs and the nursing notes.  Pertinent labs & imaging results that were available during my care of the patient were reviewed by me and considered in my medical decision making (see chart for details).     Patient has been stable here in the emergency department I feel that the hypoglycemia was due to the poor timing of the NovoLog injection.  Patient will be advised to follow-up with her primary doctor told return here as needed.  Patient agrees the plan and all questions were answered.  The patient and family both are given return precautions.  I did advise her that she will need to assess her blood sugars and the timing of her eating more appropriately.  Final Clinical Impressions(s) / ED Diagnoses   Final diagnoses:  None    ED Discharge Orders    None       Dalia Heading, Hershal Coria 09/08/17 2130    Tegeler, Gwenyth Allegra, MD 09/09/17 [id number]

## 2017-09-08 NOTE — Discharge Instructions (Signed)
He will need to follow-up with your primary doctor.  Return here as needed.  Make sure that you are not taking your NovoLog unless you are about to eat a meal.

## 2017-09-09 ENCOUNTER — Ambulatory Visit: Payer: Medicare HMO | Admitting: Physician Assistant

## 2017-09-16 DIAGNOSIS — I10 Essential (primary) hypertension: Secondary | ICD-10-CM | POA: Diagnosis not present

## 2017-09-16 DIAGNOSIS — N184 Chronic kidney disease, stage 4 (severe): Secondary | ICD-10-CM | POA: Diagnosis not present

## 2017-09-16 DIAGNOSIS — Z794 Long term (current) use of insulin: Secondary | ICD-10-CM | POA: Diagnosis not present

## 2017-09-16 DIAGNOSIS — E1165 Type 2 diabetes mellitus with hyperglycemia: Secondary | ICD-10-CM | POA: Diagnosis not present

## 2017-09-16 DIAGNOSIS — Z6841 Body Mass Index (BMI) 40.0 and over, adult: Secondary | ICD-10-CM | POA: Diagnosis not present

## 2017-09-28 ENCOUNTER — Encounter: Payer: Self-pay | Admitting: Physician Assistant

## 2017-09-28 ENCOUNTER — Ambulatory Visit (INDEPENDENT_AMBULATORY_CARE_PROVIDER_SITE_OTHER): Payer: Medicare HMO | Admitting: Physician Assistant

## 2017-09-28 VITALS — BP 122/78 | HR 82 | Ht 65.0 in | Wt 232.0 lb

## 2017-09-28 DIAGNOSIS — I110 Hypertensive heart disease with heart failure: Secondary | ICD-10-CM

## 2017-09-28 DIAGNOSIS — I5032 Chronic diastolic (congestive) heart failure: Secondary | ICD-10-CM

## 2017-09-28 DIAGNOSIS — R55 Syncope and collapse: Secondary | ICD-10-CM

## 2017-09-28 DIAGNOSIS — N183 Chronic kidney disease, stage 3 unspecified: Secondary | ICD-10-CM

## 2017-09-28 NOTE — Patient Instructions (Signed)
Medication Instructions:  1. Your physician recommends that you continue on your current medications as directed. Please refer to the Current Medication list given to you today.   Labwork: NONE ORDERED TODAY  Testing/Procedures: NONE ORDERED TODAY  Follow-Up: Your physician wants you to follow-up in: [place] DR. ROSS You will receive a reminder letter in the mail two months in advance. If you don't receive a letter, please call our office to schedule the follow-up appointment.   Any Other Special Instructions Will Be Listed Below (If Applicable).  MONITOR WEIGHT DAILY; IF YOUR WEIGHT IS UP BY 2 LB'S OVER THE NEXT WEEK THEN OK TO TAKE EXTRA TORSEMIDE 20 MG '   IF WEIGHT IS UP BY 3 LB'S IN 1 DAY OR 5 LB'S IN 1 WEEK THEN PLEASE CALL THE OFFICE FOR FURTHER ADVICE [phone]   If you need a refill on your cardiac medications before your next appointment, please call your pharmacy.

## 2017-09-28 NOTE — Progress Notes (Signed)
Cardiology Office Note:    Date:  09/28/2017   ID:  Brandi Melton 11/01/1930, MRN [id number]  PCP:  Haywood Pao, MD  Cardiologist:  Dorris Carnes, MD   Referring MD: Haywood Pao, MD   Chief Complaint  Patient presents with  . Follow-up    CHF    History of Present Illness:    Brandi Melton is a 84 y.o. female with of [place] with chronic diastolic heart failure, chronic kidney disease, hypertension, hyperlipidemia, sleep apnea, rheumatoid arthritis.  She has been admitted several times in the past with decompensated heart failure.  She was last admitted in November 2018 for decompensated heart failure and again in December 2018 for decompensated heart failure in the setting of UTI.  Last seen in 05/2017.    Brandi Melton returns for follow up.  She is here with her daughter.  She was seen in the ED last month with syncope in the setting of hypoglycemia (Glucose 27).  She took her short acting insulin about an hour before her meal.  She has not had any further episodes.  She denies chest pain, paroxysmal nocturnal dyspnea. She sleeps in a recliner chronically.  Her lower extremity swelling is unchanged.  She notes dyspnea on exertion with mild to mod activity.  This is unchanged.  She does not weigh herself consistently. She still eats soup several times a week and her daughter feels her diet remains high in salt.   Prior CV studies:   The following studies were reviewed today:  Echo 04/05/17 Mild LVH, EF 28-78, grade 1 diastolic dysfunction, MAC, mild MR  Echo 3/17 Vigorous LVF, EF 65-70%, normal wall motion, grade 1 diastolic dysfunction, severe MAC, no regurgitation, normal RVSF  LHC 2/15 ([place], [place]) EF 65-70% LCx minimal mid disease LAD minimal disease RCA minimal disease PAP 50/16 mmHg, mean PAP 26 mmHg  Past Medical History:  Diagnosis Date  . Arthritis    "hips before they were replaced, right knee now" (11/09/2015)  . Chronic  diastolic (congestive) heart failure (HCC)    a. echo 06/2015: EF 65-70%, Grade 1 DD, mitral valve calcification.  . CKD (chronic kidney disease), stage III ([place])   . Depression   . Dyspnea   . H/O cardiac catheterization    a. h/o cardiac catheterization 05/2013 in [place] showing only minimal CAD with increased filling pressure.  . Hyperlipemia   . Hypertensive heart disease   . Morbid obesity ([place])   . Palsy, Bell's   . RA (rheumatoid arthritis) ([place])   . Sleep apnea    "suppose to wear mask; couldn't" (11/09/2015)  . Type II diabetes mellitus ([place])    Surgical Hx: The patient  has a past surgical history that includes Knee Arthroplasty (Left); Colonoscopy; ORIF wrist fracture (Right, 08/08/2014); Carpal tunnel release (Right, 08/08/2014); Fracture surgery; Cholecystectomy open (1966); Appendectomy (1966); Joint replacement; Total hip arthroplasty (Bilateral); Cataract extraction w/ intraocular lens  implant, bilateral (Bilateral); and Cardiac catheterization (05/2013).   Current Medications: Current Meds  Medication Sig  . aspirin EC 81 MG tablet Take 81 mg by mouth daily.  . benzonatate (TESSALON) 200 MG capsule Take 1 capsule (200 mg total) by mouth 3 (three) times daily.  . calcium carbonate (TUMS EX) 750 MG chewable tablet Chew 1 tablet by mouth daily.  . carvedilol (COREG CR) 80 MG 24 hr capsule TAKE 1 CAPSULE BY MOUTH  DAILY  . chlorhexidine (PERIDEX) 0.12 % solution Use as directed  5 mLs in the mouth or throat daily. Swish and spit  . chlorpheniramine-HYDROcodone (TUSSIONEX) 10-8 MG/5ML SUER Take 5 mLs by mouth every 12 (twelve) hours as needed for cough.  . cholecalciferol (VITAMIN D) 1000 units tablet Take 3,000 Units by mouth daily.  Marland Kitchen ezetimibe-simvastatin (VYTORIN) 10-40 MG tablet TAKE 1 TABLET BY MOUTH  DAILY  . folic acid (FOLVITE) 509 MCG tablet Take 400 mcg by mouth daily.  Marland Kitchen glucose 4 GM chewable tablet Chew 1 tablet by mouth once as needed for low blood sugar.  .  insulin aspart (NOVOLOG FLEXPEN) 100 UNIT/ML FlexPen Inject 45 Units into the skin See admin instructions. Inject 45 units subcutaneously two to three times daily with meals  . Insulin Detemir (LEVEMIR FLEXTOUCH) 100 UNIT/ML Pen Inject 65 Units into the skin 2 (two) times daily.  . magnesium oxide (MAG-OX) 400 MG tablet Take 1 tablet (400 mg total) by mouth daily.  . potassium chloride SA (K-DUR,KLOR-CON) 20 MEQ tablet TAKE 1 TABLET(20 MEQ) BY MOUTH DAILY  . predniSONE (DELTASONE) 10 MG tablet 11m po qday x 1 day then 344mpo qday x 1 day then 2049mo qday x 1 day then 38m64m qday x 1 day for bronchitis  . torsemide (DEMADEX) 20 MG tablet Take 40 mg by mouth daily.  . Vitamin D, Ergocalciferol, (DRISDOL) 50000 UNITS CAPS capsule Take 50,000 Units by mouth every Wednesday.      Allergies:   Patient has no known allergies.   Social History   Tobacco Use  . Smoking status: Never Smoker  . Smokeless tobacco: Never Used  Substance Use Topics  . Alcohol use: Yes    Alcohol/week: 0.0 oz    Comment: 11/09/2015 "might have glass of champaign on special occasion"  . Drug use: No     Family Hx: The patient's family history includes Stroke in her mother; Sudden death in her mother. There is no history of Hypertension.  ROS:   Please see the history of present illness.    Review of Systems  Cardiovascular: Positive for dyspnea on exertion and syncope.  Respiratory: Positive for shortness of breath.    All other systems reviewed and are negative.   EKGs/Labs/Other Test Reviewed:    EKG:  EKG is  ordered today.  The ekg ordered today demonstrates normal sinus rhythm, HR 77, LAD, RBBB, QTc 509 ms, no change since tracing dated 04/04/17  Recent Labs: 01/05/2017: NT-Pro BNP 133 02/23/2017: TSH 3.068 04/04/2017: B Natriuretic Peptide 215.4 04/11/2017: ALT 46 09/08/2017: BUN 22; Creatinine, Ser 1.41; Hemoglobin 12.8; Platelets 207; Potassium 5.5; Sodium 143   Recent Lipid Panel No results  found for: CHOL, TRIG, HDL, CHOLHDL, LDLCALC, LDLDIRECT  Physical Exam:    VS:  BP 122/78   Pulse 82   Ht _0  (1.651 m)   Wt 232 lb (105.2 kg)   SpO2 95%   BMI 38.61 kg/m     Wt Readings from Last 3 Encounters:  09/28/17 232 lb (105.2 kg)  06/10/17 227 lb (103 kg)  04/11/17 221 lb (100.2 kg)     Physical Exam  Constitutional: She is oriented to person, place, and time. She appears well-developed and well-nourished. No distress.  HENT:  Head: Normocephalic and atraumatic.  Neck: Neck supple.  Cardiovascular: Normal rate, regular rhythm, S1 normal, S2 normal and normal heart sounds.  No murmur heard. Pulmonary/Chest: Effort normal. She has no rales.  Abdominal: Soft.  Musculoskeletal: She exhibits edema (tight 1-2+ bilat LE edema).  Neurological: She  is alert and oriented to person, place, and time.  Skin: Skin is warm and dry.    ASSESSMENT & PLAN:    Vasovagal syncope She had a syncopal episode last month in the setting of hypoglycemia.  EKG today is unchanged.  No further cardiac testing is indicated.  She has follow up with her PCP soon to manage her diabetes.    Chronic diastolic CHF (congestive heart failure) (HCC) Her volume is overall stable.  Continue current dose of diuretic.  I have asked her to weigh daily.  Of note, her weight is up 5 lbs by our scales. If she gains 2 more lbs in the next several days, I have asked her to take an extra Torsemide 20 mg.    Hypertensive heart disease with chronic diastolic congestive heart failure ([place]) The patient's blood pressure is controlled on her current regimen.  Continue current therapy.    CKD (chronic kidney disease), stage III (HCC) Recent Creatinine stable.   Dispo:  Return in about 6 months (around 03/30/2018) for Routine Follow Up, w/ Dr. Harrington Challenger, or Richardson Dopp, PA-C.   Medication Adjustments/Labs and Tests Ordered: Current medicines are reviewed at length with the patient today.  Concerns regarding medicines  are outlined above.  Tests Ordered: Orders Placed This Encounter  Procedures  . EKG 12-Lead   Medication Changes: No orders of the defined types were placed in this encounter.   Signed, Richardson Dopp, PA-C  09/28/2017 10:04 AM    [facility] [address], [place], [place]  [address] Phone: [phone]; Fax: [phone]

## 2017-09-29 DIAGNOSIS — E1165 Type 2 diabetes mellitus with hyperglycemia: Secondary | ICD-10-CM | POA: Diagnosis not present

## 2017-10-14 ENCOUNTER — Ambulatory Visit: Payer: Medicare HMO | Admitting: Physician Assistant

## 2017-10-27 DIAGNOSIS — M6281 Muscle weakness (generalized): Secondary | ICD-10-CM | POA: Diagnosis not present

## 2017-10-27 DIAGNOSIS — N393 Stress incontinence (female) (male): Secondary | ICD-10-CM | POA: Diagnosis not present

## 2017-10-28 DIAGNOSIS — N184 Chronic kidney disease, stage 4 (severe): Secondary | ICD-10-CM | POA: Diagnosis not present

## 2017-10-28 DIAGNOSIS — Z794 Long term (current) use of insulin: Secondary | ICD-10-CM | POA: Diagnosis not present

## 2017-10-28 DIAGNOSIS — E1165 Type 2 diabetes mellitus with hyperglycemia: Secondary | ICD-10-CM | POA: Diagnosis not present

## 2017-10-28 DIAGNOSIS — Z6841 Body Mass Index (BMI) 40.0 and over, adult: Secondary | ICD-10-CM | POA: Diagnosis not present

## 2017-10-28 DIAGNOSIS — I10 Essential (primary) hypertension: Secondary | ICD-10-CM | POA: Diagnosis not present

## 2017-10-29 DIAGNOSIS — N393 Stress incontinence (female) (male): Secondary | ICD-10-CM | POA: Diagnosis not present

## 2017-10-29 DIAGNOSIS — M6281 Muscle weakness (generalized): Secondary | ICD-10-CM | POA: Diagnosis not present

## 2017-10-30 ENCOUNTER — Other Ambulatory Visit: Payer: Self-pay | Admitting: Internal Medicine

## 2017-11-02 DIAGNOSIS — M6281 Muscle weakness (generalized): Secondary | ICD-10-CM | POA: Diagnosis not present

## 2017-11-02 DIAGNOSIS — N393 Stress incontinence (female) (male): Secondary | ICD-10-CM | POA: Diagnosis not present

## 2017-11-04 DIAGNOSIS — N393 Stress incontinence (female) (male): Secondary | ICD-10-CM | POA: Diagnosis not present

## 2017-11-04 DIAGNOSIS — M6281 Muscle weakness (generalized): Secondary | ICD-10-CM | POA: Diagnosis not present

## 2017-11-09 DIAGNOSIS — N393 Stress incontinence (female) (male): Secondary | ICD-10-CM | POA: Diagnosis not present

## 2017-11-09 DIAGNOSIS — M6281 Muscle weakness (generalized): Secondary | ICD-10-CM | POA: Diagnosis not present

## 2017-11-26 DIAGNOSIS — E1165 Type 2 diabetes mellitus with hyperglycemia: Secondary | ICD-10-CM | POA: Diagnosis not present

## 2017-11-30 DIAGNOSIS — R69 Illness, unspecified: Secondary | ICD-10-CM | POA: Diagnosis not present

## 2017-12-03 DIAGNOSIS — Z794 Long term (current) use of insulin: Secondary | ICD-10-CM | POA: Diagnosis not present

## 2017-12-03 DIAGNOSIS — E1165 Type 2 diabetes mellitus with hyperglycemia: Secondary | ICD-10-CM | POA: Diagnosis not present

## 2017-12-03 DIAGNOSIS — G4733 Obstructive sleep apnea (adult) (pediatric): Secondary | ICD-10-CM | POA: Diagnosis not present

## 2017-12-03 DIAGNOSIS — I5032 Chronic diastolic (congestive) heart failure: Secondary | ICD-10-CM | POA: Diagnosis not present

## 2017-12-03 DIAGNOSIS — D631 Anemia in chronic kidney disease: Secondary | ICD-10-CM | POA: Diagnosis not present

## 2017-12-03 DIAGNOSIS — I10 Essential (primary) hypertension: Secondary | ICD-10-CM | POA: Diagnosis not present

## 2017-12-03 DIAGNOSIS — I872 Venous insufficiency (chronic) (peripheral): Secondary | ICD-10-CM | POA: Diagnosis not present

## 2017-12-03 DIAGNOSIS — E7800 Pure hypercholesterolemia, unspecified: Secondary | ICD-10-CM | POA: Diagnosis not present

## 2017-12-03 DIAGNOSIS — I130 Hypertensive heart and chronic kidney disease with heart failure and stage 1 through stage 4 chronic kidney disease, or unspecified chronic kidney disease: Secondary | ICD-10-CM | POA: Diagnosis not present

## 2017-12-03 DIAGNOSIS — N184 Chronic kidney disease, stage 4 (severe): Secondary | ICD-10-CM | POA: Diagnosis not present

## 2017-12-08 DIAGNOSIS — N184 Chronic kidney disease, stage 4 (severe): Secondary | ICD-10-CM | POA: Diagnosis not present

## 2017-12-08 DIAGNOSIS — E1165 Type 2 diabetes mellitus with hyperglycemia: Secondary | ICD-10-CM | POA: Diagnosis not present

## 2017-12-08 DIAGNOSIS — Z6841 Body Mass Index (BMI) 40.0 and over, adult: Secondary | ICD-10-CM | POA: Diagnosis not present

## 2017-12-08 DIAGNOSIS — I10 Essential (primary) hypertension: Secondary | ICD-10-CM | POA: Diagnosis not present

## 2017-12-08 DIAGNOSIS — Z794 Long term (current) use of insulin: Secondary | ICD-10-CM | POA: Diagnosis not present

## 2017-12-24 DIAGNOSIS — R69 Illness, unspecified: Secondary | ICD-10-CM | POA: Diagnosis not present

## 2017-12-25 DIAGNOSIS — E1165 Type 2 diabetes mellitus with hyperglycemia: Secondary | ICD-10-CM | POA: Diagnosis not present

## 2017-12-28 ENCOUNTER — Emergency Department (HOSPITAL_COMMUNITY)
Admission: EM | Admit: 2017-12-28 | Discharge: 2017-12-28 | Disposition: A | Discharge status: Home / Self Care | Payer: Medicare HMO | Attending: Emergency Medicine | Admitting: Emergency Medicine

## 2017-12-28 ENCOUNTER — Encounter (HOSPITAL_COMMUNITY): Payer: Self-pay | Admitting: Emergency Medicine

## 2017-12-28 ENCOUNTER — Other Ambulatory Visit: Payer: Self-pay

## 2017-12-28 ENCOUNTER — Emergency Department (HOSPITAL_COMMUNITY): Payer: Medicare HMO

## 2017-12-28 DIAGNOSIS — Z5321 Procedure and treatment not carried out due to patient leaving prior to being seen by health care provider: Secondary | ICD-10-CM | POA: Insufficient documentation

## 2017-12-28 DIAGNOSIS — R29810 Facial weakness: Secondary | ICD-10-CM | POA: Insufficient documentation

## 2017-12-28 DIAGNOSIS — M542 Cervicalgia: Secondary | ICD-10-CM | POA: Diagnosis not present

## 2017-12-28 LAB — DIFFERENTIAL
Abs Immature Granulocytes: 0.1 10*3/uL (ref 0.0–0.1)
Basophils Absolute: 0.1 10*3/uL (ref 0.0–0.1)
Basophils Relative: 1 %
Eosinophils Absolute: 0.3 10*3/uL (ref 0.0–0.7)
Eosinophils Relative: 4 %
Immature Granulocytes: 1 %
Lymphocytes Relative: 35 %
Lymphs Abs: 3 10*3/uL (ref 0.7–4.0)
Monocytes Absolute: 0.8 10*3/uL (ref 0.1–1.0)
Monocytes Relative: 9 %
Neutro Abs: 4.3 10*3/uL (ref 1.7–7.7)
Neutrophils Relative %: 50 %

## 2017-12-28 LAB — CBC
HCT: 41.7 % (ref 36.0–46.0)
Hemoglobin: 12.6 g/dL (ref 12.0–15.0)
MCH: 30.7 pg (ref 26.0–34.0)
MCHC: 30.2 g/dL (ref 30.0–36.0)
MCV: 101.7 fL — ABNORMAL HIGH (ref 78.0–100.0)
Platelets: 194 10*3/uL (ref 150–400)
RBC: 4.1 MIL/uL (ref 3.87–5.11)
RDW: 14.1 % (ref 11.5–15.5)
WBC: 8.6 10*3/uL (ref 4.0–10.5)

## 2017-12-28 LAB — APTT: aPTT: 25 seconds (ref 24–36)

## 2017-12-28 LAB — COMPREHENSIVE METABOLIC PANEL
ALT: 23 U/L (ref 0–44)
AST: 30 U/L (ref 15–41)
Albumin: 3.3 g/dL — ABNORMAL LOW (ref 3.5–5.0)
Alkaline Phosphatase: 84 U/L (ref 38–126)
Anion gap: 19 — ABNORMAL HIGH (ref 5–15)
BUN: 28 mg/dL — ABNORMAL HIGH (ref 8–23)
CO2: 23 mmol/L (ref 22–32)
Calcium: 9.6 mg/dL (ref 8.9–10.3)
Chloride: 101 mmol/L (ref 98–111)
Creatinine, Ser: 1.79 mg/dL — ABNORMAL HIGH (ref 0.44–1.00)
GFR calc Af Amer: 29 mL/min — ABNORMAL LOW (ref >60)
GFR calc non Af Amer: 25 mL/min — ABNORMAL LOW (ref >60)
Glucose, Bld: 146 mg/dL — ABNORMAL HIGH (ref 70–99)
Potassium: 4.5 mmol/L (ref 3.5–5.1)
Sodium: 143 mmol/L (ref 135–145)
Total Bilirubin: 0.6 mg/dL (ref 0.3–1.2)
Total Protein: 6.9 g/dL (ref 6.5–8.1)

## 2017-12-28 LAB — I-STAT CHEM 8, ED
BUN: 31 mg/dL — ABNORMAL HIGH (ref 8–23)
Calcium, Ion: 1.14 mmol/L — ABNORMAL LOW (ref 1.15–1.40)
Chloride: 102 mmol/L (ref 98–111)
Creatinine, Ser: 1.8 mg/dL — ABNORMAL HIGH (ref 0.44–1.00)
Glucose, Bld: 143 mg/dL — ABNORMAL HIGH (ref 70–99)
HCT: 40 % (ref 36.0–46.0)
Hemoglobin: 13.6 g/dL (ref 12.0–15.0)
Potassium: 4.3 mmol/L (ref 3.5–5.1)
Sodium: 141 mmol/L (ref 135–145)
TCO2: 30 mmol/L (ref 22–32)

## 2017-12-28 LAB — I-STAT TROPONIN, ED: Troponin i, poc: 0 ng/mL (ref 0.00–0.08)

## 2017-12-28 LAB — PROTIME-INR
INR: 0.98
Prothrombin Time: 12.9 seconds (ref 11.4–15.2)

## 2017-12-28 NOTE — ED Notes (Signed)
Pt's son told RN that they are leaving, they can not wait here for hours. Pt has appointment with PCP tomorrow. RN attempted to convince pt to stay. Pt decided to leave.

## 2017-12-28 NOTE — ED Triage Notes (Addendum)
Pt reports facial droop since yesterday am. Pt reports having pain to R neck prior to noting the facial droop. Pt reports this pain is intermittent. Pt reports hx of bell's palsy twice. Pt denies any other neuro deficits. Pt has L side facial droop, no drift noted.

## 2017-12-28 NOTE — ED Notes (Signed)
No answer, when called for vitals

## 2017-12-29 DIAGNOSIS — Z6841 Body Mass Index (BMI) 40.0 and over, adult: Secondary | ICD-10-CM | POA: Diagnosis not present

## 2017-12-29 DIAGNOSIS — G510 Bell's palsy: Secondary | ICD-10-CM | POA: Diagnosis not present

## 2018-01-01 DIAGNOSIS — E1129 Type 2 diabetes mellitus with other diabetic kidney complication: Secondary | ICD-10-CM | POA: Diagnosis not present

## 2018-01-13 DIAGNOSIS — I10 Essential (primary) hypertension: Secondary | ICD-10-CM | POA: Diagnosis not present

## 2018-01-13 DIAGNOSIS — N184 Chronic kidney disease, stage 4 (severe): Secondary | ICD-10-CM | POA: Diagnosis not present

## 2018-01-13 DIAGNOSIS — G510 Bell's palsy: Secondary | ICD-10-CM | POA: Diagnosis not present

## 2018-01-13 DIAGNOSIS — Z6841 Body Mass Index (BMI) 40.0 and over, adult: Secondary | ICD-10-CM | POA: Diagnosis not present

## 2018-01-13 DIAGNOSIS — Z794 Long term (current) use of insulin: Secondary | ICD-10-CM | POA: Diagnosis not present

## 2018-01-13 DIAGNOSIS — E1165 Type 2 diabetes mellitus with hyperglycemia: Secondary | ICD-10-CM | POA: Diagnosis not present

## 2018-01-13 DIAGNOSIS — R69 Illness, unspecified: Secondary | ICD-10-CM | POA: Diagnosis not present

## 2018-01-22 DIAGNOSIS — E1165 Type 2 diabetes mellitus with hyperglycemia: Secondary | ICD-10-CM | POA: Diagnosis not present

## 2018-01-26 DIAGNOSIS — I10 Essential (primary) hypertension: Secondary | ICD-10-CM | POA: Diagnosis not present

## 2018-01-26 DIAGNOSIS — E1165 Type 2 diabetes mellitus with hyperglycemia: Secondary | ICD-10-CM | POA: Diagnosis not present

## 2018-01-26 DIAGNOSIS — G510 Bell's palsy: Secondary | ICD-10-CM | POA: Diagnosis not present

## 2018-02-09 DIAGNOSIS — E1165 Type 2 diabetes mellitus with hyperglycemia: Secondary | ICD-10-CM | POA: Diagnosis not present

## 2018-02-23 DIAGNOSIS — Z9114 Patient's other noncompliance with medication regimen: Secondary | ICD-10-CM | POA: Diagnosis not present

## 2018-02-23 DIAGNOSIS — E1165 Type 2 diabetes mellitus with hyperglycemia: Secondary | ICD-10-CM | POA: Diagnosis not present

## 2018-02-23 DIAGNOSIS — Z6841 Body Mass Index (BMI) 40.0 and over, adult: Secondary | ICD-10-CM | POA: Diagnosis not present

## 2018-02-23 DIAGNOSIS — I10 Essential (primary) hypertension: Secondary | ICD-10-CM | POA: Diagnosis not present

## 2018-02-23 DIAGNOSIS — N184 Chronic kidney disease, stage 4 (severe): Secondary | ICD-10-CM | POA: Diagnosis not present

## 2018-03-03 DIAGNOSIS — E1129 Type 2 diabetes mellitus with other diabetic kidney complication: Secondary | ICD-10-CM | POA: Diagnosis not present

## 2018-03-10 DIAGNOSIS — E1165 Type 2 diabetes mellitus with hyperglycemia: Secondary | ICD-10-CM | POA: Diagnosis not present

## 2018-03-22 ENCOUNTER — Other Ambulatory Visit: Payer: Self-pay | Admitting: Physician Assistant

## 2018-03-24 DIAGNOSIS — M109 Gout, unspecified: Secondary | ICD-10-CM | POA: Diagnosis not present

## 2018-03-24 DIAGNOSIS — E1165 Type 2 diabetes mellitus with hyperglycemia: Secondary | ICD-10-CM | POA: Diagnosis not present

## 2018-03-24 DIAGNOSIS — N184 Chronic kidney disease, stage 4 (severe): Secondary | ICD-10-CM | POA: Diagnosis not present

## 2018-03-24 DIAGNOSIS — R82998 Other abnormal findings in urine: Secondary | ICD-10-CM | POA: Diagnosis not present

## 2018-03-24 DIAGNOSIS — E7800 Pure hypercholesterolemia, unspecified: Secondary | ICD-10-CM | POA: Diagnosis not present

## 2018-03-31 ENCOUNTER — Encounter: Payer: Self-pay | Admitting: Physician Assistant

## 2018-03-31 ENCOUNTER — Ambulatory Visit: Payer: Medicare HMO | Admitting: Physician Assistant

## 2018-03-31 VITALS — BP 120/68 | HR 76 | Ht 65.0 in | Wt 226.8 lb

## 2018-03-31 DIAGNOSIS — Z0000 Encounter for general adult medical examination without abnormal findings: Secondary | ICD-10-CM | POA: Diagnosis not present

## 2018-03-31 DIAGNOSIS — E1129 Type 2 diabetes mellitus with other diabetic kidney complication: Secondary | ICD-10-CM | POA: Diagnosis not present

## 2018-03-31 DIAGNOSIS — N184 Chronic kidney disease, stage 4 (severe): Secondary | ICD-10-CM | POA: Diagnosis not present

## 2018-03-31 DIAGNOSIS — N183 Chronic kidney disease, stage 3 unspecified: Secondary | ICD-10-CM

## 2018-03-31 DIAGNOSIS — D631 Anemia in chronic kidney disease: Secondary | ICD-10-CM | POA: Diagnosis not present

## 2018-03-31 DIAGNOSIS — I5032 Chronic diastolic (congestive) heart failure: Secondary | ICD-10-CM | POA: Diagnosis not present

## 2018-03-31 DIAGNOSIS — E1159 Type 2 diabetes mellitus with other circulatory complications: Secondary | ICD-10-CM | POA: Diagnosis not present

## 2018-03-31 DIAGNOSIS — Z9114 Patient's other noncompliance with medication regimen: Secondary | ICD-10-CM | POA: Diagnosis not present

## 2018-03-31 DIAGNOSIS — I110 Hypertensive heart disease with heart failure: Secondary | ICD-10-CM | POA: Diagnosis not present

## 2018-03-31 DIAGNOSIS — E1165 Type 2 diabetes mellitus with hyperglycemia: Secondary | ICD-10-CM | POA: Diagnosis not present

## 2018-03-31 DIAGNOSIS — Z794 Long term (current) use of insulin: Secondary | ICD-10-CM | POA: Diagnosis not present

## 2018-03-31 DIAGNOSIS — I130 Hypertensive heart and chronic kidney disease with heart failure and stage 1 through stage 4 chronic kidney disease, or unspecified chronic kidney disease: Secondary | ICD-10-CM | POA: Diagnosis not present

## 2018-03-31 NOTE — Patient Instructions (Signed)
Medication Instructions:  Your physician recommends that you continue on your current medications as directed. Please refer to the Current Medication list given to you today.  If you need a refill on your cardiac medications before your next appointment, please call your pharmacy.   Lab work: NONE If you have labs (blood work) drawn today and your tests are completely normal, you will receive your results only by: Marland Kitchen MyChart Message (if you have MyChart) OR . A paper copy in the mail If you have any lab test that is abnormal or we need to change your treatment, we will call you to review the results.  Testing/Procedures: NONE  Follow-Up: At [facility], you and your health needs are our priority.  As part of our continuing mission to provide you with exceptional heart care, we have created designated Provider Care Teams.  These Care Teams include your primary Cardiologist (physician) and Advanced Practice Providers (APPs -  Physician Assistants and Nurse Practitioners) who all work together to provide you with the care you need, when you need it. You will need a follow up appointment in:  6 months.  Please call our office 2 months in advance to schedule this appointment.  You may see Dorris Carnes, MD or one of the following Advanced Practice Providers on your designated Care Team: Richardson Dopp, PA-C  Any Other Special Instructions Will Be Listed Below (If Applicable).

## 2018-03-31 NOTE — Progress Notes (Signed)
Cardiology Office Note:    Date:  03/31/2018   ID:  Brandi, Melton 11/08/1930, MRN [id number]  PCP:  Haywood Pao, MD  Cardiologist:  Dorris Carnes, MD   Electrophysiologist:  None   Referring MD: Haywood Pao, MD   Chief Complaint  Patient presents with  . Follow-up    CHF     History of Present Illness:    Brandi Mel[place] is a 84 y[place]o. [place], chronic kidney disease, hypertension, hyperlipidemia, sleep apnea, rheumatoid arthritis. She has been admitted several times in the past with decompensated heart failure. She was last admitted in November 2018 for decompensated heart failure and again in December 2018 for decompensated heart failure in thesetting of UTI.     Brandi Melton returns for follow up.  She is here with her daughter.  She saw her PCP today.  Her A1c is ~10 now.  She has been missing her meal time Novolog. She also continues to have some dryness of her L eye related to a recent bout with Bells Palsy. She denies chest pain, syncope, significant shortness of breath.  Her leg swelling is stable.   Prior CV studies:   The following studies were reviewed today:  Echo 04/05/17 Mild LVH, EF 88-41, grade 1 diastolic dysfunction, MAC, mild MR  Echo 3/17 Vigorous LVF, EF 65-70%, normal wall motion, grade 1 diastolic dysfunction, severe MAC, no regurgitation, normal RVSF  LHC 2/15 ([place], [place]) EF 65-70% LCx minimal mid disease LAD minimal disease RCA minimal disease PAP 50/16 mmHg, mean PAP 26 mmHg  Past Medical History:  Diagnosis Date  . Arthritis    "hips before they were replaced, right knee now" (11/09/2015)  . Chronic diastolic (congestive) heart failure (HCC)    a. echo 06/2015: EF 65-70%, Grade 1 DD, mitral valve calcification.  . CKD (chronic kidney disease), stage III ([place])   . Depression   . Dyspnea   . H/O cardiac catheterization    a. h/o cardiac  catheterization 05/2013 in [place] showing only minimal CAD with increased filling pressure.  . Hyperlipemia   . Hypertensive heart disease   . Morbid obesity ([name])   . Palsy, Bell's   . RA (rheumatoid arthritis) ([place])   . Sleep apnea    "suppose to wear mask; couldn't" (11/09/2015)  . Type II diabetes mellitus ([place])    Surgical Hx: The patient  has a past surgical history that includes Knee Arthroplasty (Left); Colonoscopy; ORIF wrist fracture (Right, 08/08/2014); Carpal tunnel release (Right, 08/08/2014); Fracture surgery; Cholecystectomy open (1966); Appendectomy (1966); Joint replacement; Total hip arthroplasty (Bilateral); Cataract extraction w/ intraocular lens  implant, bilateral (Bilateral); and Cardiac catheterization (05/2013).   Current Medications: Current Meds  Medication Sig  . ACCU-CHEK AVIVA PLUS test strip TEST BS QID  . aspirin EC 81 MG tablet Take 81 mg by mouth daily.  . calcium carbonate (TUMS EX) 750 MG chewable tablet Chew 1 tablet by mouth daily.  . carvedilol (COREG CR) 80 MG 24 hr capsule TAKE 1 CAPSULE BY MOUTH  DAILY  . chlorhexidine (PERIDEX) 0.12 % solution Use as directed 5 mLs in the mouth or throat daily. Swish and spit  . chlorpheniramine-HYDROcodone (TUSSIONEX) 10-8 MG/5ML SUER Take 5 mLs by mouth every 12 (twelve) hours as needed for cough.  . cholecalciferol (VITAMIN D) 1000 units tablet Take 3,000 Units by mouth daily.  Marland Kitchen ezetimibe-simvastatin (VYTORIN) 10-40 MG tablet TAKE 1 TABLET BY  MOUTH  DAILY  . glucose 4 GM chewable tablet Chew 1 tablet by mouth once as needed for low blood sugar.  . insulin aspart (NOVOLOG FLEXPEN) 100 UNIT/ML FlexPen Inject 25 Units into the skin See admin instructions. Inject 25 units subcutaneously two to three times daily with meals  . Insulin Detemir (LEVEMIR FLEXTOUCH) 100 UNIT/ML Pen Inject 75 Units into the skin 2 (two) times daily.   . magnesium oxide (MAG-OX) 400 MG tablet Take 1 tablet (400 mg total) by mouth daily.  .  potassium chloride SA (K-DUR,KLOR-CON) 20 MEQ tablet TAKE 1 TABLET(20 MEQ) BY MOUTH DAILY  . torsemide (DEMADEX) 20 MG tablet Take 40 mg by mouth daily.  . Vitamin D, Ergocalciferol, (DRISDOL) 50000 UNITS CAPS capsule Take 50,000 Units by mouth every Wednesday.      Allergies:   Patient has no known allergies.   Social History   Tobacco Use  . Smoking status: Never Smoker  . Smokeless tobacco: Never Used  Substance Use Topics  . Alcohol use: Yes    Alcohol/week: 0.0 standard drinks    Comment: 11/09/2015 "might have glass of champaign on special occasion"  . Drug use: No     Family Hx: The patient's family history includes Stroke in her mother; Sudden death in her mother. There is no history of Hypertension.  ROS:   Please see the history of present illness.    ROS All other systems reviewed and are negative.   EKGs/Labs/Other Test Reviewed:    EKG:  EKG is   ordered today.  The ekg ordered today demonstrates NSR, HR 76, LaD, RBBB, QTc 501, similar to prior ECG dated 12/29/17  Recent Labs: 04/04/2017: B Natriuretic Peptide 215.4 12/28/2017: ALT 23; BUN 31; Creatinine, Ser 1.80; Hemoglobin 13.6; Platelets 194; Potassium 4.3; Sodium 141   Recent Lipid Panel No results found for: CHOL, TRIG, HDL, CHOLHDL, LDLCALC, LDLDIRECT   From [facility] Tool: Cholesterol, total 151.000 m 03/24/2018 HDL 41 MG/DL 03/24/2018 LDL 84.000 mg 03/24/2018 Triglycerides 129.000 03/24/2018 A1C 10.400 % 03/24/2018 Hemoglobin 13.000 g/ 03/24/2018 Creatinine, Serum 1.400 mg/ 03/24/2018 Potassium 4.300 12/28/2017 ALT (SGPT) 39.000 uni 03/24/2018 TSH 3.068 02/23/2017 BNP 45.600 10/01/2015 INR 0.980 12/28/2017 Platelets 194.000 12/28/2017   From K[facility] Tool: Cholesterol, total 151.000 m 03/24/2018 HDL 41 MG/DL 03/24/2018 LDL 84.000 mg 03/24/2018 Triglycerides 129.000 03/24/2018 A1C 10.400 % 03/24/2018 Hemoglobin 13.000 g/ 03/24/2018 Creatinine, Serum 1.400 mg/ 03/24/2018 Potassium 4.300 12/28/2017 ALT  (SGPT) 39.000 uni 03/24/2018 TSH 3.068 02/23/2017 BNP 45.600 10/01/2015 INR 0.980 12/28/2017 Platelets 194.000 12/28/2017  Physical Exam:    VS:  BP 120/68   Pulse 76   Ht _0  (1.651 m)   Wt 226 lb 12.8 oz (102.9 kg)   SpO2 92%   BMI 37.74 kg/m     Wt Readings from Last 3 Encounters:  03/31/18 226 lb 12.8 oz (102.9 kg)  09/28/17 232 lb (105.2 kg)  06/10/17 227 lb (103 kg)     Physical Exam  Constitutional: She is oriented to person, place, and time. She appears well-developed and well-nourished. No distress.  HENT:  Head: Normocephalic and atraumatic.  Neck: Neck supple. No thyromegaly present.  Cardiovascular: Normal rate, regular rhythm, S1 normal, S2 normal and normal heart sounds.  No murmur heard. Pulmonary/Chest: Effort normal. She has no rales.  Abdominal: Soft. There is no hepatomegaly.  Musculoskeletal:        General: Edema (1+ bilat LE edema) present.  Neurological: She is alert and oriented to person, place, and time.  Skin: Skin is warm and dry.    ASSESSMENT & PLAN:    Chronic diastolic CHF (congestive heart failure) (HCC) From a heart failure standpoint, she has been doing well.  Her volume status is stable.  Continue current therapy.  She can follow-up with Dr. Harrington Challenger or me in 6 months.  Hypertensive heart disease with chronic diastolic congestive heart failure ([place]) - The patient's blood pressure is controlled on her current regimen.  Continue current therapy.   CKD (chronic kidney disease), stage III (HCC)  Recent creatinine stable.   Dispo:  Return in about 6 months (around 09/30/2018) for Routine Follow Up, w/ Dr. Harrington Challenger, or Richardson Dopp, PA-C.   Medication Adjustments/Labs and Tests Ordered: Current medicines are reviewed at length with the patient today.  Concerns regarding medicines are outlined above.  Tests Ordered: Orders Placed This Encounter  Procedures  . EKG 12-Lead   Medication Changes: No orders of the defined types were placed in  this encounter.   Signed, Richardson Dopp, PA-C  03/31/2018 3:29 PM    [facility] [place], [place], [place]  [address] Phone: [phone]; Fax: [phone]

## 2018-04-01 ENCOUNTER — Other Ambulatory Visit: Payer: Self-pay | Admitting: Adult Health

## 2018-04-12 DIAGNOSIS — E1129 Type 2 diabetes mellitus with other diabetic kidney complication: Secondary | ICD-10-CM | POA: Diagnosis not present

## 2018-04-21 DIAGNOSIS — Z9114 Patient's other noncompliance with medication regimen: Secondary | ICD-10-CM | POA: Diagnosis not present

## 2018-04-21 DIAGNOSIS — G510 Bell's palsy: Secondary | ICD-10-CM | POA: Diagnosis not present

## 2018-04-21 DIAGNOSIS — Z6839 Body mass index (BMI) 39.0-39.9, adult: Secondary | ICD-10-CM | POA: Diagnosis not present

## 2018-04-21 DIAGNOSIS — E1159 Type 2 diabetes mellitus with other circulatory complications: Secondary | ICD-10-CM | POA: Diagnosis not present

## 2018-04-21 DIAGNOSIS — I10 Essential (primary) hypertension: Secondary | ICD-10-CM | POA: Diagnosis not present

## 2018-05-12 DIAGNOSIS — I10 Essential (primary) hypertension: Secondary | ICD-10-CM | POA: Diagnosis not present

## 2018-05-12 DIAGNOSIS — E1165 Type 2 diabetes mellitus with hyperglycemia: Secondary | ICD-10-CM | POA: Diagnosis not present

## 2018-05-12 DIAGNOSIS — N184 Chronic kidney disease, stage 4 (severe): Secondary | ICD-10-CM | POA: Diagnosis not present

## 2018-05-12 DIAGNOSIS — Z794 Long term (current) use of insulin: Secondary | ICD-10-CM | POA: Diagnosis not present

## 2018-05-13 DIAGNOSIS — E1129 Type 2 diabetes mellitus with other diabetic kidney complication: Secondary | ICD-10-CM | POA: Diagnosis not present

## 2018-06-18 DIAGNOSIS — E1129 Type 2 diabetes mellitus with other diabetic kidney complication: Secondary | ICD-10-CM | POA: Diagnosis not present

## 2018-06-21 DIAGNOSIS — E1165 Type 2 diabetes mellitus with hyperglycemia: Secondary | ICD-10-CM | POA: Diagnosis not present

## 2018-06-21 DIAGNOSIS — I130 Hypertensive heart and chronic kidney disease with heart failure and stage 1 through stage 4 chronic kidney disease, or unspecified chronic kidney disease: Secondary | ICD-10-CM | POA: Diagnosis not present

## 2018-06-21 DIAGNOSIS — Z9114 Patient's other noncompliance with medication regimen: Secondary | ICD-10-CM | POA: Diagnosis not present

## 2018-06-21 DIAGNOSIS — D631 Anemia in chronic kidney disease: Secondary | ICD-10-CM | POA: Diagnosis not present

## 2018-06-21 DIAGNOSIS — I5032 Chronic diastolic (congestive) heart failure: Secondary | ICD-10-CM | POA: Diagnosis not present

## 2018-06-21 DIAGNOSIS — G4733 Obstructive sleep apnea (adult) (pediatric): Secondary | ICD-10-CM | POA: Diagnosis not present

## 2018-06-21 DIAGNOSIS — R600 Localized edema: Secondary | ICD-10-CM | POA: Diagnosis not present

## 2018-06-21 DIAGNOSIS — N184 Chronic kidney disease, stage 4 (severe): Secondary | ICD-10-CM | POA: Diagnosis not present

## 2018-06-21 DIAGNOSIS — I872 Venous insufficiency (chronic) (peripheral): Secondary | ICD-10-CM | POA: Diagnosis not present

## 2018-06-21 DIAGNOSIS — E7800 Pure hypercholesterolemia, unspecified: Secondary | ICD-10-CM | POA: Diagnosis not present

## 2018-06-28 DIAGNOSIS — H43812 Vitreous degeneration, left eye: Secondary | ICD-10-CM | POA: Diagnosis not present

## 2018-06-28 DIAGNOSIS — G510 Bell's palsy: Secondary | ICD-10-CM | POA: Diagnosis not present

## 2018-06-28 DIAGNOSIS — H43811 Vitreous degeneration, right eye: Secondary | ICD-10-CM | POA: Diagnosis not present

## 2018-06-28 DIAGNOSIS — E119 Type 2 diabetes mellitus without complications: Secondary | ICD-10-CM | POA: Diagnosis not present

## 2018-07-02 DIAGNOSIS — E1165 Type 2 diabetes mellitus with hyperglycemia: Secondary | ICD-10-CM | POA: Diagnosis not present

## 2018-07-03 DIAGNOSIS — R0902 Hypoxemia: Secondary | ICD-10-CM | POA: Diagnosis not present

## 2018-07-03 DIAGNOSIS — E161 Other hypoglycemia: Secondary | ICD-10-CM | POA: Diagnosis not present

## 2018-07-03 DIAGNOSIS — E162 Hypoglycemia, unspecified: Secondary | ICD-10-CM | POA: Diagnosis not present

## 2018-07-03 DIAGNOSIS — R404 Transient alteration of awareness: Secondary | ICD-10-CM | POA: Diagnosis not present

## 2018-08-02 DIAGNOSIS — E1165 Type 2 diabetes mellitus with hyperglycemia: Secondary | ICD-10-CM | POA: Diagnosis not present

## 2018-08-04 DIAGNOSIS — Z9114 Patient's other noncompliance with medication regimen: Secondary | ICD-10-CM | POA: Diagnosis not present

## 2018-08-04 DIAGNOSIS — N184 Chronic kidney disease, stage 4 (severe): Secondary | ICD-10-CM | POA: Diagnosis not present

## 2018-08-04 DIAGNOSIS — I10 Essential (primary) hypertension: Secondary | ICD-10-CM | POA: Diagnosis not present

## 2018-08-04 DIAGNOSIS — Z794 Long term (current) use of insulin: Secondary | ICD-10-CM | POA: Diagnosis not present

## 2018-08-04 DIAGNOSIS — E1165 Type 2 diabetes mellitus with hyperglycemia: Secondary | ICD-10-CM | POA: Diagnosis not present

## 2018-09-01 ENCOUNTER — Other Ambulatory Visit: Payer: Self-pay | Admitting: Internal Medicine

## 2018-09-01 ENCOUNTER — Other Ambulatory Visit: Payer: Self-pay | Admitting: Physician Assistant

## 2018-09-01 DIAGNOSIS — E1165 Type 2 diabetes mellitus with hyperglycemia: Secondary | ICD-10-CM | POA: Diagnosis not present

## 2018-09-15 DIAGNOSIS — E1129 Type 2 diabetes mellitus with other diabetic kidney complication: Secondary | ICD-10-CM | POA: Diagnosis not present

## 2018-10-11 DIAGNOSIS — E1165 Type 2 diabetes mellitus with hyperglycemia: Secondary | ICD-10-CM | POA: Diagnosis not present

## 2018-10-13 DIAGNOSIS — I5032 Chronic diastolic (congestive) heart failure: Secondary | ICD-10-CM | POA: Diagnosis not present

## 2018-10-13 DIAGNOSIS — Z794 Long term (current) use of insulin: Secondary | ICD-10-CM | POA: Diagnosis not present

## 2018-10-13 DIAGNOSIS — R69 Illness, unspecified: Secondary | ICD-10-CM | POA: Diagnosis not present

## 2018-10-13 DIAGNOSIS — E1165 Type 2 diabetes mellitus with hyperglycemia: Secondary | ICD-10-CM | POA: Diagnosis not present

## 2018-10-13 DIAGNOSIS — I130 Hypertensive heart and chronic kidney disease with heart failure and stage 1 through stage 4 chronic kidney disease, or unspecified chronic kidney disease: Secondary | ICD-10-CM | POA: Diagnosis not present

## 2018-10-13 DIAGNOSIS — Z9114 Patient's other noncompliance with medication regimen: Secondary | ICD-10-CM | POA: Diagnosis not present

## 2018-10-16 ENCOUNTER — Emergency Department (HOSPITAL_COMMUNITY): Payer: Medicare HMO

## 2018-10-16 ENCOUNTER — Inpatient Hospital Stay (HOSPITAL_COMMUNITY)
Admission: EM | Admit: 2018-10-16 | Discharge: 2018-10-18 | DRG: 558 | Disposition: A | Discharge status: Home Health | Payer: Medicare HMO | Attending: Family Medicine | Admitting: Family Medicine

## 2018-10-16 ENCOUNTER — Encounter (HOSPITAL_COMMUNITY): Payer: Self-pay | Admitting: Emergency Medicine

## 2018-10-16 ENCOUNTER — Other Ambulatory Visit: Payer: Self-pay

## 2018-10-16 DIAGNOSIS — Z20828 Contact with and (suspected) exposure to other viral communicable diseases: Secondary | ICD-10-CM | POA: Diagnosis present

## 2018-10-16 DIAGNOSIS — E860 Dehydration: Secondary | ICD-10-CM | POA: Diagnosis present

## 2018-10-16 DIAGNOSIS — R0902 Hypoxemia: Secondary | ICD-10-CM | POA: Diagnosis not present

## 2018-10-16 DIAGNOSIS — S79911A Unspecified injury of right hip, initial encounter: Secondary | ICD-10-CM | POA: Diagnosis not present

## 2018-10-16 DIAGNOSIS — W19XXXA Unspecified fall, initial encounter: Secondary | ICD-10-CM | POA: Diagnosis present

## 2018-10-16 DIAGNOSIS — E875 Hyperkalemia: Secondary | ICD-10-CM | POA: Diagnosis present

## 2018-10-16 DIAGNOSIS — Z794 Long term (current) use of insulin: Secondary | ICD-10-CM

## 2018-10-16 DIAGNOSIS — M79604 Pain in right leg: Secondary | ICD-10-CM

## 2018-10-16 DIAGNOSIS — G4730 Sleep apnea, unspecified: Secondary | ICD-10-CM | POA: Diagnosis present

## 2018-10-16 DIAGNOSIS — I10 Essential (primary) hypertension: Secondary | ICD-10-CM | POA: Diagnosis present

## 2018-10-16 DIAGNOSIS — Z03818 Encounter for observation for suspected exposure to other biological agents ruled out: Secondary | ICD-10-CM | POA: Diagnosis not present

## 2018-10-16 DIAGNOSIS — Z96643 Presence of artificial hip joint, bilateral: Secondary | ICD-10-CM | POA: Diagnosis present

## 2018-10-16 DIAGNOSIS — Z66 Do not resuscitate: Secondary | ICD-10-CM | POA: Diagnosis present

## 2018-10-16 DIAGNOSIS — N184 Chronic kidney disease, stage 4 (severe): Secondary | ICD-10-CM | POA: Diagnosis present

## 2018-10-16 DIAGNOSIS — E1165 Type 2 diabetes mellitus with hyperglycemia: Secondary | ICD-10-CM | POA: Diagnosis not present

## 2018-10-16 DIAGNOSIS — Z6836 Body mass index (BMI) 36.0-36.9, adult: Secondary | ICD-10-CM

## 2018-10-16 DIAGNOSIS — I5032 Chronic diastolic (congestive) heart failure: Secondary | ICD-10-CM | POA: Diagnosis present

## 2018-10-16 DIAGNOSIS — E119 Type 2 diabetes mellitus without complications: Secondary | ICD-10-CM

## 2018-10-16 DIAGNOSIS — Z9119 Patient's noncompliance with other medical treatment and regimen: Secondary | ICD-10-CM

## 2018-10-16 DIAGNOSIS — R748 Abnormal levels of other serum enzymes: Secondary | ICD-10-CM | POA: Diagnosis not present

## 2018-10-16 DIAGNOSIS — E785 Hyperlipidemia, unspecified: Secondary | ICD-10-CM | POA: Diagnosis present

## 2018-10-16 DIAGNOSIS — M069 Rheumatoid arthritis, unspecified: Secondary | ICD-10-CM | POA: Diagnosis present

## 2018-10-16 DIAGNOSIS — Z79899 Other long term (current) drug therapy: Secondary | ICD-10-CM

## 2018-10-16 DIAGNOSIS — R52 Pain, unspecified: Secondary | ICD-10-CM | POA: Diagnosis not present

## 2018-10-16 DIAGNOSIS — D696 Thrombocytopenia, unspecified: Secondary | ICD-10-CM | POA: Diagnosis present

## 2018-10-16 DIAGNOSIS — S8991XA Unspecified injury of right lower leg, initial encounter: Secondary | ICD-10-CM | POA: Diagnosis not present

## 2018-10-16 DIAGNOSIS — G4733 Obstructive sleep apnea (adult) (pediatric): Secondary | ICD-10-CM | POA: Diagnosis present

## 2018-10-16 DIAGNOSIS — I130 Hypertensive heart and chronic kidney disease with heart failure and stage 1 through stage 4 chronic kidney disease, or unspecified chronic kidney disease: Secondary | ICD-10-CM | POA: Diagnosis present

## 2018-10-16 DIAGNOSIS — L989 Disorder of the skin and subcutaneous tissue, unspecified: Secondary | ICD-10-CM | POA: Diagnosis present

## 2018-10-16 DIAGNOSIS — N183 Chronic kidney disease, stage 3 unspecified: Secondary | ICD-10-CM | POA: Diagnosis present

## 2018-10-16 DIAGNOSIS — S199XXA Unspecified injury of neck, initial encounter: Secondary | ICD-10-CM | POA: Diagnosis not present

## 2018-10-16 DIAGNOSIS — M545 Low back pain: Secondary | ICD-10-CM | POA: Diagnosis not present

## 2018-10-16 DIAGNOSIS — M6282 Rhabdomyolysis: Secondary | ICD-10-CM | POA: Diagnosis not present

## 2018-10-16 DIAGNOSIS — S0990XA Unspecified injury of head, initial encounter: Secondary | ICD-10-CM | POA: Diagnosis not present

## 2018-10-16 DIAGNOSIS — G3184 Mild cognitive impairment, so stated: Secondary | ICD-10-CM | POA: Diagnosis present

## 2018-10-16 DIAGNOSIS — R55 Syncope and collapse: Secondary | ICD-10-CM | POA: Diagnosis present

## 2018-10-16 DIAGNOSIS — E1122 Type 2 diabetes mellitus with diabetic chronic kidney disease: Secondary | ICD-10-CM | POA: Diagnosis present

## 2018-10-16 DIAGNOSIS — I452 Bifascicular block: Secondary | ICD-10-CM | POA: Diagnosis present

## 2018-10-16 DIAGNOSIS — M25551 Pain in right hip: Secondary | ICD-10-CM | POA: Diagnosis not present

## 2018-10-16 DIAGNOSIS — Z7982 Long term (current) use of aspirin: Secondary | ICD-10-CM

## 2018-10-16 LAB — CBC WITH DIFFERENTIAL/PLATELET
Abs Immature Granulocytes: 0.06 10*3/uL (ref 0.00–0.07)
Basophils Absolute: 0 10*3/uL (ref 0.0–0.1)
Basophils Relative: 0 %
Eosinophils Absolute: 0.1 10*3/uL (ref 0.0–0.5)
Eosinophils Relative: 1 %
HCT: 42.7 % (ref 36.0–46.0)
Hemoglobin: 13.8 g/dL (ref 12.0–15.0)
Immature Granulocytes: 1 %
Lymphocytes Relative: 16 %
Lymphs Abs: 1.8 10*3/uL (ref 0.7–4.0)
MCH: 30.9 pg (ref 26.0–34.0)
MCHC: 32.3 g/dL (ref 30.0–36.0)
MCV: 95.7 fL (ref 80.0–100.0)
Monocytes Absolute: 1.2 10*3/uL — ABNORMAL HIGH (ref 0.1–1.0)
Monocytes Relative: 10 %
Neutro Abs: 8.3 10*3/uL — ABNORMAL HIGH (ref 1.7–7.7)
Neutrophils Relative %: 72 %
Platelets: 169 10*3/uL (ref 150–400)
RBC: 4.46 MIL/uL (ref 3.87–5.11)
RDW: 14.4 % (ref 11.5–15.5)
WBC: 11.4 10*3/uL — ABNORMAL HIGH (ref 4.0–10.5)
nRBC: 0 % (ref 0.0–0.2)

## 2018-10-16 LAB — URINALYSIS, ROUTINE W REFLEX MICROSCOPIC
Bacteria, UA: NONE SEEN
Bilirubin Urine: NEGATIVE
Glucose, UA: >=500 mg/dL — AB
Ketones, ur: 5 mg/dL — AB
Nitrite: NEGATIVE
Protein, ur: 30 mg/dL — AB
Specific Gravity, Urine: 1.015 (ref 1.005–1.030)
pH: 7 (ref 5.0–8.0)

## 2018-10-16 LAB — BASIC METABOLIC PANEL
Anion gap: 13 (ref 5–15)
BUN: 34 mg/dL — ABNORMAL HIGH (ref 8–23)
CO2: 22 mmol/L (ref 22–32)
Calcium: 9.1 mg/dL (ref 8.9–10.3)
Chloride: 104 mmol/L (ref 98–111)
Creatinine, Ser: 1.75 mg/dL — ABNORMAL HIGH (ref 0.44–1.00)
GFR calc Af Amer: 30 mL/min — ABNORMAL LOW (ref >60)
GFR calc non Af Amer: 26 mL/min — ABNORMAL LOW (ref >60)
Glucose, Bld: 332 mg/dL — ABNORMAL HIGH (ref 70–99)
Potassium: 5.5 mmol/L — ABNORMAL HIGH (ref 3.5–5.1)
Sodium: 139 mmol/L (ref 135–145)

## 2018-10-16 LAB — CK: Total CK: 1706 U/L — ABNORMAL HIGH (ref 38–234)

## 2018-10-16 LAB — GLUCOSE, CAPILLARY
Glucose-Capillary: 303 mg/dL — ABNORMAL HIGH (ref 70–99)
Glucose-Capillary: 369 mg/dL — ABNORMAL HIGH (ref 70–99)

## 2018-10-16 LAB — SARS CORONAVIRUS 2 BY RT PCR (HOSPITAL ORDER, PERFORMED IN ~~LOC~~ HOSPITAL LAB): SARS Coronavirus 2: NEGATIVE

## 2018-10-16 MED ORDER — ONDANSETRON HCL 4 MG PO TABS: 4.0000 mg | ORAL_TABLET | Freq: Four times a day (QID) | ORAL | Status: DC | PRN

## 2018-10-16 MED ORDER — DOCUSATE SODIUM 100 MG PO CAPS
100.0000 mg | ORAL_CAPSULE | Freq: Two times a day (BID) | ORAL | Status: DC
  Administered 2018-10-16 – 2018-10-18 (×4): 100 mg via ORAL
  Filled 2018-10-16 (×4): qty 1

## 2018-10-16 MED ORDER — INSULIN ASPART 100 UNIT/ML ~~LOC~~ SOLN
0.0000 [IU] | Freq: Three times a day (TID) | SUBCUTANEOUS | Status: DC
  Administered 2018-10-16: 11 [IU] via SUBCUTANEOUS
  Administered 2018-10-17 (×2): 8 [IU] via SUBCUTANEOUS
  Administered 2018-10-17: 11 [IU] via SUBCUTANEOUS
  Administered 2018-10-18: 2 [IU] via SUBCUTANEOUS
  Administered 2018-10-18: 3 [IU] via SUBCUTANEOUS

## 2018-10-16 MED ORDER — ONDANSETRON HCL 4 MG/2ML IJ SOLN: 4.0000 mg | Freq: Four times a day (QID) | INTRAMUSCULAR | Status: DC | PRN

## 2018-10-16 MED ORDER — INSULIN DETEMIR 100 UNIT/ML ~~LOC~~ SOLN
60.0000 [IU] | Freq: Every day | SUBCUTANEOUS | Status: DC
  Administered 2018-10-16 – 2018-10-17 (×2): 60 [IU] via SUBCUTANEOUS
  Filled 2018-10-16 (×3): qty 0.6

## 2018-10-16 MED ORDER — ACETAMINOPHEN 650 MG RE SUPP: 650.0000 mg | Freq: Four times a day (QID) | RECTAL | Status: DC | PRN

## 2018-10-16 MED ORDER — EZETIMIBE-SIMVASTATIN 10-40 MG PO TABS
1.0000 | ORAL_TABLET | Freq: Every day | ORAL | Status: DC
  Administered 2018-10-16 – 2018-10-17 (×2): 1 via ORAL
  Filled 2018-10-16 (×3): qty 1

## 2018-10-16 MED ORDER — LACTATED RINGERS IV SOLN
INTRAVENOUS | Status: DC
  Administered 2018-10-16 – 2018-10-17 (×2): via INTRAVENOUS

## 2018-10-16 MED ORDER — ACETAMINOPHEN 325 MG PO TABS: 650.0000 mg | ORAL_TABLET | Freq: Four times a day (QID) | ORAL | Status: DC | PRN

## 2018-10-16 MED ORDER — SODIUM CHLORIDE 0.9 % IV BOLUS
500.0000 mL | Freq: Once | INTRAVENOUS | Status: AC
  Administered 2018-10-16: 14:00:00 500 mL via INTRAVENOUS

## 2018-10-16 MED ORDER — CARVEDILOL PHOSPHATE ER 20 MG PO CP24
80.0000 mg | ORAL_CAPSULE | Freq: Every day | ORAL | Status: DC
  Administered 2018-10-17 – 2018-10-18 (×2): 80 mg via ORAL
  Filled 2018-10-16 (×3): qty 4

## 2018-10-16 MED ORDER — ASPIRIN EC 81 MG PO TBEC
81.0000 mg | DELAYED_RELEASE_TABLET | Freq: Every day | ORAL | Status: DC
  Administered 2018-10-16 – 2018-10-18 (×3): 81 mg via ORAL
  Filled 2018-10-16 (×3): qty 1

## 2018-10-16 MED ORDER — ENOXAPARIN SODIUM 30 MG/0.3ML ~~LOC~~ SOLN
30.0000 mg | SUBCUTANEOUS | Status: DC
  Administered 2018-10-16 – 2018-10-17 (×2): 30 mg via SUBCUTANEOUS
  Filled 2018-10-16 (×2): qty 0.3

## 2018-10-16 MED ORDER — INSULIN ASPART 100 UNIT/ML ~~LOC~~ SOLN
0.0000 [IU] | Freq: Every day | SUBCUTANEOUS | Status: DC
  Administered 2018-10-16: 5 [IU] via SUBCUTANEOUS
  Administered 2018-10-17: 3 [IU] via SUBCUTANEOUS

## 2018-10-16 NOTE — ED Notes (Signed)
Pt was soaked of urine when tech went to go change pt. Pt stated that she had been "wet" all night. Purewick is hooked back up to suction canister. Tech notified nurse [name] Pucker of pressure sore on pts buttocks.

## 2018-10-16 NOTE — ED Triage Notes (Signed)
Pt BIB [facility] from [place]. Per staff pt was found this morning around 9 am lying on the floor in her room. Per pt she fell on the way to the restroom last night around 1030 and was unable to get herself up. Pt denies loss of consciousness and states she slept intermittently on the floor last night. Pt right leg appears to be rotated outward slightly. Pt was given 50 mcg fentanyl via EMS. VSS.

## 2018-10-16 NOTE — ED Provider Notes (Signed)
[facility] EMERGENCY DEPARTMENT Provider Note   CSN: [id number] Arrival date & time: 10/16/18  1004    History   Chief Complaint Chief Complaint  Patient presents with   Fall    HPI Brandi Melton is a 85 y.o. female with history of type 2 diabetes, morbid obesity, hyperlipidemia, hypertension, CHF who presents with right leg pain following fall.  Patient is unclear on exactly how she fell, but states she went to sit down on the bed and up on the floor.  She denies feeling lightheadedness or dizzy.  Patient laid on the floor most of the night before someone found her at her assisted living facility.  Patient denies hitting her head or losing consciousness.  She slept intermittently throughout the night.  No medications taken prior to arrival, but EMS gave 50 MCG fentanyl.  Patient not anticoagulated.  No caregiver in the middle of night. Found in hallway. Alert.  Hx DM, HTN, DNR. No known history of dementia. Katelyn.      HPI  Past Medical History:  Diagnosis Date   Arthritis    "hips before they were replaced, right knee now" (11/09/2015)   Chronic diastolic (congestive) heart failure ([place])    a. echo 06/2015: EF 65-70%, Grade 1 DD, mitral valve calcification.   CKD (chronic kidney disease), stage III (HCC)    Depression    Dyspnea    H/O cardiac catheterization    a. h/o cardiac catheterization 05/2013 in [place] showing only minimal CAD with increased filling pressure.   Hyperlipemia    Hypertensive heart disease    Morbid obesity ([place])    Palsy, Bell's    RA (rheumatoid arthritis) ([place])    Sleep apnea    "suppose to wear mask; couldn't" (11/09/2015)   Type II diabetes mellitus ([place])     Patient Active Problem List   Diagnosis Date Noted   Gout 02/19/2016   H/O cardiac catheterization    Hypertensive heart disease with CHF (congestive heart failure) (HCC)    Hypoglycemia due to insulin    CKD (chronic kidney disease), stage III  (HCC)    Chronic diastolic CHF (congestive heart failure) ([place])    Morbid obesity ([place])    Hypertension    Hyperlipemia    Sleep apnea    Palsy, Bell's    Diabetes mellitus type 2, insulin dependent ([place]) 08/10/2014   Distal radius fracture 08/08/2014    Past Surgical History:  Procedure Laterality Date   APPENDECTOMY  1966   CARDIAC CATHETERIZATION  05/2013   CARPAL TUNNEL RELEASE Right 08/08/2014   Procedure: OPEN CARPAL TUNNEL RELEASE;  Surgeon: Roseanne Kaufman, MD;  Location: [facility];  Service: Orthopedics;  Laterality: Right;   CATARACT EXTRACTION W/ INTRAOCULAR LENS  IMPLANT, BILATERAL Bilateral    CHOLECYSTECTOMY OPEN  1966   COLONOSCOPY     FRACTURE SURGERY     JOINT REPLACEMENT     KNEE ARTHROPLASTY Left    ORIF WRIST FRACTURE Right 08/08/2014   Procedure: Right OPEN REDUCTION INTERNAL FIXATION (ORIF) WRIST FRACTURE WITH REPAIR AND RECONST/ALLOGRAFT AND BONE GRAFT;  Surgeon: Roseanne Kaufman, MD;  Location: [facility];  Service: Orthopedics;  Laterality: Right;   TOTAL HIP ARTHROPLASTY Bilateral      OB History   No obstetric history on file.      Home Medications    Prior to Admission medications   Medication Sig Start Date End Date Taking? Authorizing Provider  aspirin EC 81 MG tablet Take 81 mg  by mouth daily.   Yes [provider]  calcium carbonate (TUMS EX) 750 MG chewable tablet Chew 1 tablet by mouth daily.   Yes [provider]  carvedilol (COREG CR) 80 MG 24 hr capsule TAKE 1 CAPSULE BY MOUTH  DAILY Patient taking differently: Take 80 mg by mouth daily.  09/01/18  Yes Weaver, Scott T, PA-C  chlorhexidine (PERIDEX) 0.12 % solution Use as directed 5 mLs in the mouth or throat daily. Swish and spit 01/17/15  Yes [provider]  cholecalciferol (VITAMIN D) 1000 units tablet Take 3,000 Units by mouth daily.   Yes [provider]  ezetimibe-simvastatin (VYTORIN) 10-40 MG tablet TAKE 1 TABLET BY MOUTH  DAILY Patient  taking differently: Take 1 tablet by mouth at bedtime.  09/01/18  Yes Weaver, Scott T, PA-C  insulin aspart (NOVOLOG FLEXPEN) 100 UNIT/ML FlexPen Inject 15 Units into the skin See admin instructions. Inject 15 units subcutaneously two to three times daily with meals   Yes [provider]  Insulin Detemir (LEVEMIR FLEXTOUCH) 100 UNIT/ML Pen Inject 60 Units into the skin at bedtime.    Yes [provider]  magnesium oxide (MAG-OX) 400 MG tablet Take 1 tablet (400 mg total) by mouth daily. 07/07/16  Yes Fay Records, MD  potassium chloride SA (K-DUR,KLOR-CON) 20 MEQ tablet TAKE 1 TABLET(20 MEQ) BY MOUTH DAILY Patient taking differently: Take 20 mEq by mouth daily.  04/06/17  Yes Weaver, Scott T, PA-C  torsemide (DEMADEX) 20 MG tablet TAKE 3 TABLETS BY MOUTH  DAILY Patient taking differently: Take 20 mg by mouth daily.  09/01/18  Yes Fay Records, MD  Vitamin D, Ergocalciferol, (DRISDOL) 50000 UNITS CAPS capsule Take 50,000 Units by mouth every Wednesday.    Yes [provider]  ACCU-CHEK AVIVA PLUS test strip TEST BS QID 12/24/17   [provider]  chlorpheniramine-HYDROcodone (TUSSIONEX) 10-8 MG/5ML SUER Take 5 mLs by mouth every 12 (twelve) hours as needed for cough. Patient not taking: Reported on 10/16/2018 04/11/17   Jani Gravel, MD    Family History Family History  Problem Relation Age of Onset   Sudden death Mother    Stroke Mother    Hypertension Neg Hx     Social History Social History   Tobacco Use   Smoking status: Never Smoker   Smokeless tobacco: Never Used  Substance Use Topics   Alcohol use: Yes    Alcohol/week: 0.0 standard drinks    Comment: 11/09/2015 "might have glass of champaign on special occasion"   Drug use: No     Allergies   Patient has no known allergies.   Review of Systems Review of Systems  Constitutional: Negative for chills and fever.  HENT: Negative for facial swelling and sore throat.   Respiratory:  Negative for shortness of breath.   Cardiovascular: Negative for chest pain.  Gastrointestinal: Negative for abdominal pain, nausea and vomiting.  Genitourinary: Negative for dysuria.  Musculoskeletal: Positive for arthralgias. Negative for back pain and neck pain.  Skin: Negative for rash and wound.  Neurological: Negative for syncope and headaches.  Psychiatric/Behavioral: The patient is not nervous/anxious.      Physical Exam Updated Vital Signs BP (!) 164/66    Pulse 89    Temp 98.5 F (36.9 C) (Oral)    Resp 14    Ht 5\' 5"  (1.651 m)    Wt 99.8 kg    SpO2 95%    BMI 36.61 kg/m   Physical Exam Vitals signs  and nursing note reviewed.  Constitutional:      General: She is not in acute distress.    Appearance: She is well-developed. She is obese. She is not diaphoretic.  HENT:     Head: Normocephalic and atraumatic.     Mouth/Throat:     Pharynx: No oropharyngeal exudate.  Eyes:     General: No scleral icterus.       Right eye: No discharge.        Left eye: No discharge.     Conjunctiva/sclera: Conjunctivae normal.     Pupils: Pupils are equal, round, and reactive to light.  Neck:     Musculoskeletal: Normal range of motion and neck supple.     Thyroid: No thyromegaly.  Cardiovascular:     Rate and Rhythm: Normal rate and regular rhythm.     Heart sounds: Normal heart sounds. No murmur. No friction rub. No gallop.   Pulmonary:     Effort: Pulmonary effort is normal. No respiratory distress.     Breath sounds: Normal breath sounds. No stridor. No wheezing or rales.  Abdominal:     General: Bowel sounds are normal. There is no distension.     Palpations: Abdomen is soft.     Tenderness: There is no abdominal tenderness. There is no guarding or rebound.  Musculoskeletal:     Comments: Outward rotation and some shortening on the right leg, pulse intact, cap refill less than 2 seconds Tenderness on the right knee and femur; some mild tenderness on the right anterior and  lateral hip  Lymphadenopathy:     Cervical: No cervical adenopathy.  Skin:    General: Skin is warm and dry.     Coloration: Skin is not pale.     Findings: No rash.  Neurological:     Mental Status: She is alert.     Coordination: Coordination normal.      ED Treatments / Results  Labs (all labs ordered are listed, but only abnormal results are displayed) Labs Reviewed  BASIC METABOLIC PANEL - Abnormal; Notable for the following components:      Result Value   Potassium 5.5 (*)    Glucose, Bld 332 (*)    BUN 34 (*)    Creatinine, Ser 1.75 (*)    GFR calc non Af Amer 26 (*)    GFR calc Af Amer 30 (*)    All other components within normal limits  CBC WITH DIFFERENTIAL/PLATELET - Abnormal; Notable for the following components:   WBC 11.4 (*)    Neutro Abs 8.3 (*)    Monocytes Absolute 1.2 (*)    All other components within normal limits  URINALYSIS, ROUTINE W REFLEX MICROSCOPIC - Abnormal; Notable for the following components:   APPearance CLOUDY (*)    Glucose, UA >=500 (*)    Hgb urine dipstick MODERATE (*)    Ketones, ur 5 (*)    Protein, ur 30 (*)    Leukocytes,Ua TRACE (*)    All other components within normal limits  CK - Abnormal; Notable for the following components:   Total CK 1,706 (*)    All other components within normal limits  URINE CULTURE    EKG EKG Interpretation  Date/Time:  Saturday October 16 2018 10:28:24 EDT Ventricular Rate:  82 PR Interval:    QRS Duration: 158 QT Interval:  464 QTC Calculation: 542 R Axis:   -100 Text Interpretation:  Sinus rhythm Borderline prolonged PR interval RBBB and LAFB Confirmed by Ralene Bathe,  Benjamine Mola [phone]) on 10/16/2018 10:47:12 AM   Radiology Dg Lumbar Spine Complete  Result Date: 10/16/2018 CLINICAL DATA:  Chronic lower back pain. EXAM: LUMBAR SPINE - COMPLETE 4+ VIEW COMPARISON:  CT scan of May 20, 2017. FINDINGS: No fracture or spondylolisthesis is noted. Severe degenerative disc disease is noted at L2-3  with anterior osteophyte formation. Remaining disc spaces are unremarkable. Atherosclerosis of abdominal aorta is noted. IMPRESSION: Severe degenerative disc disease is noted at L2-3. No acute abnormality seen in the lumbar spine. Aortic Atherosclerosis (ICD10-I70.0). Electronically Signed   By: Marijo Conception M.D.   On: 10/16/2018 13:10   Ct Head Wo Contrast  Result Date: 10/16/2018 CLINICAL DATA:  84 year old female status post fall last night EXAM: CT HEAD WITHOUT CONTRAST CT CERVICAL SPINE WITHOUT CONTRAST TECHNIQUE: Multidetector CT imaging of the head and cervical spine was performed following the standard protocol without intravenous contrast. Multiplanar CT image reconstructions of the cervical spine were also generated. COMPARISON:  Prior head CT 12/28/2017 FINDINGS: CT HEAD FINDINGS Brain: No evidence of acute infarction, hemorrhage, hydrocephalus, extra-axial collection or mass lesion/mass effect. Central, cerebral and cerebellar cortical atrophy. Ex vacuo ventriculomegaly, stable. Moderate periventricular white matter hypoattenuation consistent with chronic microvascular ischemic white matter disease. Vascular: Atherosclerotic calcifications throughout the bilateral cavernous and supraclinoid carotid arteries. Skull: Normal. Negative for fracture or focal lesion. Sinuses/Orbits: Mild mucoperiosteal thickening throughout the ethmoid air cells with moderate disease in the sphenoid air cell. Other: None. CT CERVICAL SPINE FINDINGS Alignment: Normal. Skull base and vertebrae: No acute fracture. No primary bone lesion or focal pathologic process. Soft tissues and spinal canal: No prevertebral fluid or swelling. No visible canal hematoma. Disc levels: Extensive multilevel degenerative disc disease beginning at C4-C5 and extending into the upper thoracic spine. The most severe degenerative changes are at C5-C6 and C6-C7. Multilevel bilateral facet arthropathy is also present including ankylosis of the  posterior elements of C2 and C3 on the right and ankylosis of the posterior elements on the left at C4-C5 and C5-C6. Upper chest: Negative. Other: None. IMPRESSION: CT HEAD 1. No acute intracranial abnormality. 2. Atrophy and chronic microvascular ischemic white matter disease. 3. Mild inflammatory paranasal sinus disease. CT CSPINE 1. No acute fracture or malalignment. 2. Advanced multilevel degenerative disc disease and facet arthropathy. Electronically Signed   By: Jacqulynn Cadet M.D.   On: 10/16/2018 11:59   Ct Cervical Spine Wo Contrast  Result Date: 10/16/2018 CLINICAL DATA:  85 year old female status post fall last night EXAM: CT HEAD WITHOUT CONTRAST CT CERVICAL SPINE WITHOUT CONTRAST TECHNIQUE: Multidetector CT imaging of the head and cervical spine was performed following the standard protocol without intravenous contrast. Multiplanar CT image reconstructions of the cervical spine were also generated. COMPARISON:  Prior head CT 12/28/2017 FINDINGS: CT HEAD FINDINGS Brain: No evidence of acute infarction, hemorrhage, hydrocephalus, extra-axial collection or mass lesion/mass effect. Central, cerebral and cerebellar cortical atrophy. Ex vacuo ventriculomegaly, stable. Moderate periventricular white matter hypoattenuation consistent with chronic microvascular ischemic white matter disease. Vascular: Atherosclerotic calcifications throughout the bilateral cavernous and supraclinoid carotid arteries. Skull: Normal. Negative for fracture or focal lesion. Sinuses/Orbits: Mild mucoperiosteal thickening throughout the ethmoid air cells with moderate disease in the sphenoid air cell. Other: None. CT CERVICAL SPINE FINDINGS Alignment: Normal. Skull base and vertebrae: No acute fracture. No primary bone lesion or focal pathologic process. Soft tissues and spinal canal: No prevertebral fluid or swelling. No visible canal hematoma. Disc levels: Extensive multilevel degenerative disc disease beginning at C4-C5 and  extending into  the upper thoracic spine. The most severe degenerative changes are at C5-C6 and C6-C7. Multilevel bilateral facet arthropathy is also present including ankylosis of the posterior elements of C2 and C3 on the right and ankylosis of the posterior elements on the left at C4-C5 and C5-C6. Upper chest: Negative. Other: None. IMPRESSION: CT HEAD 1. No acute intracranial abnormality. 2. Atrophy and chronic microvascular ischemic white matter disease. 3. Mild inflammatory paranasal sinus disease. CT CSPINE 1. No acute fracture or malalignment. 2. Advanced multilevel degenerative disc disease and facet arthropathy. Electronically Signed   By: Jacqulynn Cadet M.D.   On: 10/16/2018 11:59   Ct Pelvis Wo Contrast  Result Date: 10/16/2018 CLINICAL DATA:  Found down this morning.  Right hip pain. EXAM: CT PELVIS WITHOUT CONTRAST TECHNIQUE: Multidetector CT imaging of the pelvis was performed following the standard protocol without intravenous contrast. COMPARISON:  None. FINDINGS: Urinary Tract: The bladder is unremarkable. No bladder mass or calculi. Bowel: The visualized colon and small bowel are unremarkable. No mass or obstructive findings. Vascular/Lymphatic: Moderate atherosclerotic calcifications involving the lower aorta and iliac arteries but no aneurysm. No retroperitoneal or pelvic lymphadenopathy or mass. Reproductive:  The uterus and ovaries are unremarkable. Other: No free pelvic fluid collections. No inguinal mass or adenopathy. Musculoskeletal: Bilateral hip arthroplasties are noted with associated significant artifact. No periprosthetic fractures identified. No evidence of loosening. The pubic symphysis and SI joints are intact. Moderate degenerative changes. No pelvic fractures or bone lesions. No obvious muscle tear or hematoma. No subcutaneous fluid collections or hematoma. IMPRESSION: 1. Intact bilateral total hip arthroplasties without complicating features. 2. No pelvic fractures are  identified. 3. No significant intrapelvic abnormalities. Electronically Signed   By: Marijo Sanes M.D.   On: 10/16/2018 15:23   Dg Knee Complete 4 Views Right  Result Date: 10/16/2018 CLINICAL DATA:  Patient status post fall. EXAM: RIGHT KNEE - COMPLETE 4+ VIEW COMPARISON:  None. FINDINGS: Medial compartment joint space narrowing. Tricompartmental osteophytosis. Normal anatomic alignment. No evidence for acute fracture or dislocation. No joint effusion. IMPRESSION: Tricompartmental degenerative changes. No acute osseous abnormality. Electronically Signed   By: Lovey Newcomer M.D.   On: 10/16/2018 11:08   Dg Hip Unilat W Or Wo Pelvis 2-3 Views Right  Result Date: 10/16/2018 CLINICAL DATA:  Right hip pain after fall last night. EXAM: DG HIP (WITH OR WITHOUT PELVIS) 2-3V RIGHT COMPARISON:  None. FINDINGS: Prior bilateral total hip arthroplasties. No evidence of hardware failure or loosening. Prominent heterotopic ossification about the left hip. No acute fracture or dislocation. IMPRESSION: 1.  No acute osseous abnormality. 2. Prior bilateral total hip arthroplasties without hardware complication. Electronically Signed   By: Titus Dubin M.D.   On: 10/16/2018 11:07    Procedures Procedures (including critical care time)  Medications Ordered in ED Medications  sodium chloride 0.9 % bolus 500 mL (0 mLs Intravenous Stopped 10/16/18 1549)     Initial Impression / Assessment and Plan / ED Course  I have reviewed the triage vital signs and the nursing notes.  Pertinent labs & imaging results that were available during my care of the patient were reviewed by me and considered in my medical decision making (see chart for details).        Patient presenting with right low back and leg pain after fall last evening.  Patient laid on the floor all night.  Her CK is elevated to 1700.  Imaging is negative for fracture, however suspect radicular pain.  Patient's pain has been controlled  in bed, however she  is unable to take more than 1 step without excruciating pain.  She lives in assisted living facility, but does not have 24-hour care.  Feel patient could benefit from an observation admission for slow rehydration considering history of CHF to clear CK and PT OT evaluation.  Dr. Ralene Bathe discussed patient case with Dr. Inda Merlin with [facility] who accepts patient admission.  Appreciate your assistance with the patient.  Dr. Ralene Bathe also evaluated the patient who guided the patient's management and agrees with plan.  Final Clinical Impressions(s) / ED Diagnoses   Final diagnoses:  Fall, initial encounter  Elevated CK    ED Discharge Orders    None       Frederica Kuster, Hershal Coria 10/16/18 1556    Quintella Reichert, MD 10/17/18 1211

## 2018-10-16 NOTE — ED Notes (Signed)
This RN and PA tried to walk this patient. She had an unsteady gait and complained of right lower back pain.

## 2018-10-16 NOTE — ED Notes (Signed)
Pt has purewick in place and hooked up to suction. 

## 2018-10-16 NOTE — H&P (Signed)
History and Physical    Brandi Melton LOV:[id number] DOB: [birth year] DOA: 10/16/2018  PCP: Haywood Pao, MD Consultants:  Harrington Challenger - cardiology Patient coming from: [place]; NOK: Daughter, [phone]  Chief Complaint: Fall  HPI: Brandi Melton is a 85 y.o. female with medical history significant of DM; OSA not on CPAP; RA; Bell's palsy; morbid obesity (BMI 37); HLD; stage 3 CKD; and chronic diastolic CHF presenting after a fall.  She was getting ready for bed last night.  She was sleeping in twin beds but recently has been sleeping in a recliner.  Her son-in-law was there and opened the chair for her prior to leaving.  After they left, she fell on the chair and landed on the ground.  Her right leg has been hurting.  She was unable to get up and she called for assistance all night long and nobody came.  She has fallen a few times over the last few months.     ED Course:   Lives in independent living, unwitnessed fall, laid on the ground all night.  Significant pelvic pain, no fractures.  Mild CK elevation.  Has h/o CHF, gentle IVF.  Needs PT/OT evaluation.  Review of Systems: As per HPI; otherwise review of systems reviewed and negative.   Ambulatory Status:  Ambulates with a walker  Past Medical History:  Diagnosis Date   Arthritis    "hips before they were replaced, right knee now" (11/09/2015)   Chronic diastolic (congestive) heart failure ([place])    a. echo 06/2015: EF 65-70%, Grade 1 DD, mitral valve calcification.   CKD (chronic kidney disease), stage III (HCC)    Depression    Dyspnea    H/O cardiac catheterization    a. h/o cardiac catheterization 05/2013 in [place] showing only minimal CAD with increased filling pressure.   Hyperlipemia    Hypertensive heart disease    Morbid obesity ([place])    Palsy, Bell's    RA (rheumatoid arthritis) ([place])    Sleep apnea    "suppose to wear mask; couldn't" (11/09/2015)   Type II diabetes mellitus [facility])      Past Surgical History:  Procedure Laterality Date   APPENDECTOMY  1966   CARDIAC CATHETERIZATION  05/2013   CARPAL TUNNEL RELEASE Right 08/08/2014   Procedure: OPEN CARPAL TUNNEL RELEASE;  Surgeon: Roseanne Kaufman, MD;  Location: [place];  Service: Orthopedics;  Laterality: Right;   CATARACT EXTRACTION W/ INTRAOCULAR LENS  IMPLANT, BILATERAL Bilateral    CHOLECYSTECTOMY OPEN  1966   COLONOSCOPY     FRACTURE SURGERY     JOINT REPLACEMENT     KNEE ARTHROPLASTY Left    ORIF WRIST FRACTURE Right 08/08/2014   Procedure: Right OPEN REDUCTION INTERNAL FIXATION (ORIF) WRIST FRACTURE WITH REPAIR AND RECONST/ALLOGRAFT AND BONE GRAFT;  Surgeon: Roseanne Kaufman, MD;  Location: [place];  Service: Orthopedics;  Laterality: Right;   TOTAL HIP ARTHROPLASTY Bilateral     Social History   Socioeconomic History   Marital status: Widowed    Spouse name: Not on file   Number of children: Not on file   Years of education: Not on file   Highest education level: Not on file  Occupational History   Not on file  Social Needs   Financial resource strain: Not on file   Food insecurity    Worry: Not on file    Inability: Not on file   Transportation needs    Medical: Not on file    Non-medical:  Not on file  Tobacco Use   Smoking status: Never Smoker   Smokeless tobacco: Never Used  Substance and Sexual Activity   Alcohol use: Yes    Alcohol/week: 0.0 standard drinks    Comment: 11/09/2015 "might have glass of champaign on special occasion"   Drug use: No   Sexual activity: Never  Lifestyle   Physical activity    Days per week: Not on file    Minutes per session: Not on file   Stress: Not on file  Relationships   Social connections    Talks on phone: Not on file    Gets together: Not on file    Attends religious service: Not on file    Active member of club or organization: Not on file    Attends meetings of clubs or organizations: Not on file    Relationship status:  Not on file   Intimate partner violence    Fear of current or ex partner: Not on file    Emotionally abused: Not on file    Physically abused: Not on file    Forced sexual activity: Not on file  Other Topics Concern   Not on file  Social History Narrative   Not on file    No Known Allergies  Family History  Problem Relation Age of Onset   Sudden death Mother    Stroke Mother    Hypertension Neg Hx     Prior to Admission medications   Medication Sig Start Date End Date Taking? Authorizing Provider  aspirin EC 81 MG tablet Take 81 mg by mouth daily.   Yes [provider]  calcium carbonate (TUMS EX) 750 MG chewable tablet Chew 1 tablet by mouth daily.   Yes [provider]  carvedilol (COREG CR) 80 MG 24 hr capsule TAKE 1 CAPSULE BY MOUTH  DAILY Patient taking differently: Take 80 mg by mouth daily.  09/01/18  Yes Weaver, Scott T, PA-C  chlorhexidine (PERIDEX) 0.12 % solution Use as directed 5 mLs in the mouth or throat daily. Swish and spit 01/17/15  Yes [provider]  cholecalciferol (VITAMIN D) 1000 units tablet Take 3,000 Units by mouth daily.   Yes [provider]  ezetimibe-simvastatin (VYTORIN) 10-40 MG tablet TAKE 1 TABLET BY MOUTH  DAILY Patient taking differently: Take 1 tablet by mouth at bedtime.  09/01/18  Yes Weaver, Scott T, PA-C  insulin aspart (NOVOLOG FLEXPEN) 100 UNIT/ML FlexPen Inject 15 Units into the skin See admin instructions. Inject 15 units subcutaneously two to three times daily with meals   Yes [provider]  Insulin Detemir (LEVEMIR FLEXTOUCH) 100 UNIT/ML Pen Inject 60 Units into the skin at bedtime.    Yes [provider]  magnesium oxide (MAG-OX) 400 MG tablet Take 1 tablet (400 mg total) by mouth daily. 07/07/16  Yes Fay Records, MD  potassium chloride SA (K-DUR,KLOR-CON) 20 MEQ tablet TAKE 1 TABLET(20 MEQ) BY MOUTH DAILY Patient taking differently: Take 20 mEq by mouth daily.  04/06/17  Yes  Weaver, Scott T, PA-C  torsemide (DEMADEX) 20 MG tablet TAKE 3 TABLETS BY MOUTH  DAILY Patient taking differently: Take 20 mg by mouth daily.  09/01/18  Yes Fay Records, MD  Vitamin D, Ergocalciferol, (DRISDOL) 50000 UNITS CAPS capsule Take 50,000 Units by mouth every Wednesday.    Yes [provider]  ACCU-CHEK AVIVA PLUS test strip TEST BS QID 12/24/17   [provider]  chlorpheniramine-HYDROcodone (TUSSIONEX) 10-8 MG/5ML SUER Take 5  mLs by mouth every 12 (twelve) hours as needed for cough. Patient not taking: Reported on 10/16/2018 04/11/17   Jani Gravel, MD    Physical Exam: Vitals:   10/16/18 1013 10/16/18 1023 10/16/18 1030 10/16/18 1344  BP:  (!) 176/70 (!) 166/63 (!) 164/66  Pulse:  81 83 89  Resp:  20 (!) 24 14  Temp:  98.5 F (36.9 C)    TempSrc:  Oral    SpO2:  95% 92% 95%  Weight: 99.8 kg     Height: 5\' 5"  (1.651 m)         General:  Appears calm and comfortable and is NAD  Eyes:  PERRL, EOMI, normal lids, iris  ENT:  grossly normal hearing, lips & tongue, mmm; appropriate dentition  Neck:  no LAD, masses or thyromegaly; no carotid bruits  Cardiovascular:  RRR, no m/r/g. No LE edema.   Respiratory:   CTA bilaterally with no wheezes/rales/rhonchi.  Normal respiratory effort.  Abdomen:  soft, NT, ND, NABS  Back:   normal alignment, no CVAT  Skin:  no rash or induration seen on limited exam  Musculoskeletal:  grossly normal tone BUE/BLE, good ROM, no bony abnormality  Lower extremity:  No LE edema.  Limited foot exam with no ulcerations.  2+ distal pulses.  Psychiatric:  grossly normal mood and affect, speech fluent and appropriate, AOx3  Neurologic:  CN 2-12 grossly intact, moves all extremities in coordinated fashion, sensation intact    Radiological Exams on Admission: Dg Lumbar Spine Complete  Result Date: 10/16/2018 CLINICAL DATA:  Chronic lower back pain. EXAM: LUMBAR SPINE - COMPLETE 4+ VIEW COMPARISON:  CT scan of May 20, 2017. FINDINGS: No fracture or spondylolisthesis is noted. Severe degenerative disc disease is noted at L2-3 with anterior osteophyte formation. Remaining disc spaces are unremarkable. Atherosclerosis of abdominal aorta is noted. IMPRESSION: Severe degenerative disc disease is noted at L2-3. No acute abnormality seen in the lumbar spine. Aortic Atherosclerosis (ICD10-I70.0). Electronically Signed   By: Marijo Conception M.D.   On: 10/16/2018 13:10   Ct Head Wo Contrast  Result Date: 10/16/2018 CLINICAL DATA:  85 year old female status post fall last night EXAM: CT HEAD WITHOUT CONTRAST CT CERVICAL SPINE WITHOUT CONTRAST TECHNIQUE: Multidetector CT imaging of the head and cervical spine was performed following the standard protocol without intravenous contrast. Multiplanar CT image reconstructions of the cervical spine were also generated. COMPARISON:  Prior head CT 12/28/2017 FINDINGS: CT HEAD FINDINGS Brain: No evidence of acute infarction, hemorrhage, hydrocephalus, extra-axial collection or mass lesion/mass effect. Central, cerebral and cerebellar cortical atrophy. Ex vacuo ventriculomegaly, stable. Moderate periventricular white matter hypoattenuation consistent with chronic microvascular ischemic white matter disease. Vascular: Atherosclerotic calcifications throughout the bilateral cavernous and supraclinoid carotid arteries. Skull: Normal. Negative for fracture or focal lesion. Sinuses/Orbits: Mild mucoperiosteal thickening throughout the ethmoid air cells with moderate disease in the sphenoid air cell. Other: None. CT CERVICAL SPINE FINDINGS Alignment: Normal. Skull base and vertebrae: No acute fracture. No primary bone lesion or focal pathologic process. Soft tissues and spinal canal: No prevertebral fluid or swelling. No visible canal hematoma. Disc levels: Extensive multilevel degenerative disc disease beginning at C4-C5 and extending into the upper thoracic spine. The most severe degenerative changes  are at C5-C6 and C6-C7. Multilevel bilateral facet arthropathy is also present including ankylosis of the posterior elements of C2 and C3 on the right and ankylosis of the posterior elements on the left at C4-C5 and C5-C6. Upper chest: Negative. Other: None. IMPRESSION:  CT HEAD 1. No acute intracranial abnormality. 2. Atrophy and chronic microvascular ischemic white matter disease. 3. Mild inflammatory paranasal sinus disease. CT CSPINE 1. No acute fracture or malalignment. 2. Advanced multilevel degenerative disc disease and facet arthropathy. Electronically Signed   By: Jacqulynn Cadet M.D.   On: 10/16/2018 11:59   Ct Cervical Spine Wo Contrast  Result Date: 10/16/2018 CLINICAL DATA:  85 year old female status post fall last night EXAM: CT HEAD WITHOUT CONTRAST CT CERVICAL SPINE WITHOUT CONTRAST TECHNIQUE: Multidetector CT imaging of the head and cervical spine was performed following the standard protocol without intravenous contrast. Multiplanar CT image reconstructions of the cervical spine were also generated. COMPARISON:  Prior head CT 12/28/2017 FINDINGS: CT HEAD FINDINGS Brain: No evidence of acute infarction, hemorrhage, hydrocephalus, extra-axial collection or mass lesion/mass effect. Central, cerebral and cerebellar cortical atrophy. Ex vacuo ventriculomegaly, stable. Moderate periventricular white matter hypoattenuation consistent with chronic microvascular ischemic white matter disease. Vascular: Atherosclerotic calcifications throughout the bilateral cavernous and supraclinoid carotid arteries. Skull: Normal. Negative for fracture or focal lesion. Sinuses/Orbits: Mild mucoperiosteal thickening throughout the ethmoid air cells with moderate disease in the sphenoid air cell. Other: None. CT CERVICAL SPINE FINDINGS Alignment: Normal. Skull base and vertebrae: No acute fracture. No primary bone lesion or focal pathologic process. Soft tissues and spinal canal: No prevertebral fluid or swelling. No  visible canal hematoma. Disc levels: Extensive multilevel degenerative disc disease beginning at C4-C5 and extending into the upper thoracic spine. The most severe degenerative changes are at C5-C6 and C6-C7. Multilevel bilateral facet arthropathy is also present including ankylosis of the posterior elements of C2 and C3 on the right and ankylosis of the posterior elements on the left at C4-C5 and C5-C6. Upper chest: Negative. Other: None. IMPRESSION: CT HEAD 1. No acute intracranial abnormality. 2. Atrophy and chronic microvascular ischemic white matter disease. 3. Mild inflammatory paranasal sinus disease. CT CSPINE 1. No acute fracture or malalignment. 2. Advanced multilevel degenerative disc disease and facet arthropathy. Electronically Signed   By: Jacqulynn Cadet M.D.   On: 10/16/2018 11:59   Ct Pelvis Wo Contrast  Result Date: 10/16/2018 CLINICAL DATA:  Found down this morning.  Right hip pain. EXAM: CT PELVIS WITHOUT CONTRAST TECHNIQUE: Multidetector CT imaging of the pelvis was performed following the standard protocol without intravenous contrast. COMPARISON:  None. FINDINGS: Urinary Tract: The bladder is unremarkable. No bladder mass or calculi. Bowel: The visualized colon and small bowel are unremarkable. No mass or obstructive findings. Vascular/Lymphatic: Moderate atherosclerotic calcifications involving the lower aorta and iliac arteries but no aneurysm. No retroperitoneal or pelvic lymphadenopathy or mass. Reproductive:  The uterus and ovaries are unremarkable. Other: No free pelvic fluid collections. No inguinal mass or adenopathy. Musculoskeletal: Bilateral hip arthroplasties are noted with associated significant artifact. No periprosthetic fractures identified. No evidence of loosening. The pubic symphysis and SI joints are intact. Moderate degenerative changes. No pelvic fractures or bone lesions. No obvious muscle tear or hematoma. No subcutaneous fluid collections or hematoma. IMPRESSION:  1. Intact bilateral total hip arthroplasties without complicating features. 2. No pelvic fractures are identified. 3. No significant intrapelvic abnormalities. Electronically Signed   By: Marijo Sanes M.D.   On: 10/16/2018 15:23   Dg Knee Complete 4 Views Right  Result Date: 10/16/2018 CLINICAL DATA:  Patient status post fall. EXAM: RIGHT KNEE - COMPLETE 4+ VIEW COMPARISON:  None. FINDINGS: Medial compartment joint space narrowing. Tricompartmental osteophytosis. Normal anatomic alignment. No evidence for acute fracture or dislocation. No joint effusion. IMPRESSION: Tricompartmental  degenerative changes. No acute osseous abnormality. Electronically Signed   By: Lovey Newcomer M.D.   On: 10/16/2018 11:08   Dg Hip Unilat W Or Wo Pelvis 2-3 Views Right  Result Date: 10/16/2018 CLINICAL DATA:  Right hip pain after fall last night. EXAM: DG HIP (WITH OR WITHOUT PELVIS) 2-3V RIGHT COMPARISON:  None. FINDINGS: Prior bilateral total hip arthroplasties. No evidence of hardware failure or loosening. Prominent heterotopic ossification about the left hip. No acute fracture or dislocation. IMPRESSION: 1.  No acute osseous abnormality. 2. Prior bilateral total hip arthroplasties without hardware complication. Electronically Signed   By: Titus Dubin M.D.   On: 10/16/2018 11:07    EKG: Independently reviewed.  NSR with rate 82; RBBB and LAFB with no evidence of acute ischemia   Labs on Admission: I have personally reviewed the available labs and imaging studies at the time of the admission.  Pertinent labs:   K+ 5.5 Glucose 332 BUN 34/Creatinine 1.75/GFR 26 - stable CK 1706 WBC 11.4 UA: >500 glucose, moderate Hgb, 5 ketones, trace LE, 30 protein  Assessment/Plan Principal Problem:   Fall Active Problems:   Diabetes mellitus type 2, insulin dependent (HCC)   Hypertension   Sleep apnea   CKD (chronic kidney disease), stage III (HCC)   Chronic diastolic CHF (congestive heart failure) (HCC)   Morbid  obesity ([place])   Fall -Patient with a fall last night at her SNF -Mildly elevated CK, otherwise no apparent injuries on imaging; recheck CK in AM after gentle IVF hydration overnight -Despite this, patient complains of severe pain and is unable to ambulate -This is exacerbated by her weight and generally poor exercise tolerance at baseline -She lives in independent living and appears unable to return to this at this time -Will observe overnight -PT/OT consults -SW consult for placement -However, patient is adamantly opposed to the idea of placement due to financial reasons  DM -Poor control in the ER -Continue Levemir -Will cover with moderate-scale SSI  HTN -Continue Coreg  Stage 3 CKD -Appears to be stable at this time -Consider holding Vytorin as it is contraindicated due to her renal function  OSA -Refuses CPAP  Chronic diastolic CHF -Hold Demadex and gently hydrate given mild ketonuria and elevated CK -Monitor for evidence of volume overload  Morbid obesity -This is likely to exacerbate her discomfort with mobility   Note: This patient will be tested and is pending for the novel coronavirus COVID-19.  DVT prophylaxis:  Lovenox  Code Status:  DNR - confirmed with patient Family Communication: None present; I spoke with her daughter by telephone Disposition Plan:To be determined Consults called: PT/OT/SW Admission status: It is my clinical opinion that referral for OBSERVATION is reasonable and necessary in this patient based on the above information provided. The aforementioned taken together are felt to place the patient at high risk for further clinical deterioration. However it is anticipated that the patient may be medically stable for discharge from the hospital within 24 to 48 hours.    Karmen Bongo MD [facility]   How to contact the [facility] Attending or Consulting provider [place] or covering provider during after hours [place], for this patient?   1. Check the care team in [facility] and look for a) attending/consulting [facility] provider listed and b) the [facility] team listed 2. Log into [email or web address] and use [place] universal password to access. If you do not have the password, please contact the hospital operator. 3. Locate the [facility] provider you  are looking for under [facility] and page to a number that you can be directly reached. 4. If you still have difficulty reaching the provider, please page the [facility] (Director on Call) for the Hospitalists listed on amion for assistance.   10/16/2018, 4:20 PM

## 2018-10-16 NOTE — ED Notes (Signed)
Pt reportedly fell at 2200 hrs the evening before and was discovered at 0900 this date. Pt complains of pain to the right hip/groin area and her right knee.

## 2018-10-16 NOTE — ED Notes (Signed)
ED TO INPATIENT HANDOFF REPORT  ED Nurse Name and Phone #: [phone]  S Name/Age/Gender Brandi Melton Kitchen 85 y.o. female Room/Bed: 030C/030C  Code Status   Code Status: DNR  Home/SNF/Other Nursing Home Patient oriented to: self, place, time and situation Is this baseline? Yes   Triage Complete: Triage complete  Chief Complaint fall/hip inj  Triage Note Pt BIB [facility] from [place]. Per staff pt was found this morning around 9 am lying on the floor in her room. Per pt she fell on the way to the restroom last night around 1030 and was unable to get herself up. Pt denies loss of consciousness and states she slept intermittently on the floor last night. Pt right leg appears to be rotated outward slightly. Pt was given 50 mcg fentanyl via EMS. VSS.    Allergies No Known Allergies  Level of Care/Admitting Diagnosis ED Disposition    ED Disposition Condition Comment   Admit  Hospital Area: [facility] [[id number]]  Level of Care: Med-Surg [16]  I expect the patient will be discharged within 24 hours: No (not a candidate for 5C-Observation unit)  Covid Evaluation: Asymptomatic Screening Protocol (No Symptoms)  Diagnosis: Fall [290176]  Admitting Physician: Karmen Bongo [[id number]]  Attending Physician: Karmen Bongo [[id number]]  PT Class (Do Not Modify): Observation [104]  PT Acc Code (Do Not Modify): Observation [10022]       B Medical/Surgery History Past Medical History:  Diagnosis Date  . Arthritis    "hips before they were replaced, right knee now" (11/09/2015)  . Chronic diastolic (congestive) heart failure (HCC)    a. echo 06/2015: EF 65-70%, Grade 1 DD, mitral valve calcification.  . CKD (chronic kidney disease), stage III ([place])   . Depression   . Dyspnea   . H/O cardiac catheterization    a. h/o cardiac catheterization 05/2013 in [place] showing only minimal CAD with increased filling pressure.  . Hyperlipemia   . Hypertensive heart disease   . Morbid  obesity ([place])   . Palsy, Bell's   . RA (rheumatoid arthritis) ([place])   . Sleep apnea    "suppose to wear mask; couldn't" (11/09/2015)  . Type II diabetes mellitus ([place])    Past Surgical History:  Procedure Laterality Date  . APPENDECTOMY  1966  . CARDIAC CATHETERIZATION  05/2013  . CARPAL TUNNEL RELEASE Right 08/08/2014   Procedure: OPEN CARPAL TUNNEL RELEASE;  Surgeon: Roseanne Kaufman, MD;  Location: [facility];  Service: Orthopedics;  Laterality: Right;  . CATARACT EXTRACTION W/ INTRAOCULAR LENS  IMPLANT, BILATERAL Bilateral   . CHOLECYSTECTOMY OPEN  1966  . COLONOSCOPY    . FRACTURE SURGERY    . JOINT REPLACEMENT    . KNEE ARTHROPLASTY Left   . ORIF WRIST FRACTURE Right 08/08/2014   Procedure: Right OPEN REDUCTION INTERNAL FIXATION (ORIF) WRIST FRACTURE WITH REPAIR AND RECONST/ALLOGRAFT AND BONE GRAFT;  Surgeon: Roseanne Kaufman, MD;  Location: [facility];  Service: Orthopedics;  Laterality: Right;  . TOTAL HIP ARTHROPLASTY Bilateral      A IV Location/Drains/Wounds Patient Lines/Drains/Airways Status   Active Line/Drains/Airways    Name:   Placement date:   Placement time:   Site:   Days:   Peripheral IV 10/16/18 Left;Posterior Hand   10/16/18    1008    Hand   less than 1   External Urinary Catheter   04/04/17    2000    -   560   Incision (Closed) 08/08/14 Arm Right   08/08/14  1929     1530          Intake/Output Last 24 hours  Intake/Output Summary (Last 24 hours) at 10/16/2018 1651 Last data filed at 10/16/2018 1549 Gross per 24 hour  Intake 450 ml  Output -  Net 450 ml    Labs/Imaging Results for orders placed or performed during the hospital encounter of 10/16/18 (from the past 48 hour(s))  Basic metabolic panel     Status: Abnormal   Collection Time: 10/16/18 12:20 PM  Result Value Ref Range   Sodium 139 135 - 145 mmol/L   Potassium 5.5 (H) 3.5 - 5.1 mmol/L    Comment: HEMOLYSIS AT THIS LEVEL MAY AFFECT RESULT   Chloride 104 98 - 111 mmol/L   CO2 22 22 - 32 mmol/L    Glucose, Bld 332 (H) 70 - 99 mg/dL   BUN 34 (H) 8 - 23 mg/dL   Creatinine, Ser 1.75 (H) 0.44 - 1.00 mg/dL   Calcium 9.1 8.9 - 10.3 mg/dL   GFR calc non Af Amer 26 (L) >60 mL/min   GFR calc Af Amer 30 (L) >60 mL/min   Anion gap 13 5 - 15    Comment: Performed at [facility], [address] [place], [place] [address]  CBC with Differential     Status: Abnormal   Collection Time: 10/16/18 12:20 PM  Result Value Ref Range   WBC 11.4 (H) 4.0 - 10.5 K/uL   RBC 4.46 3.87 - 5.11 MIL/uL   Hemoglobin 13.8 12.0 - 15.0 g/dL   HCT 42.7 36.0 - 46.0 %   MCV 95.7 80.0 - 100.0 fL   MCH 30.9 26.0 - 34.0 pg   MCHC 32.3 30.0 - 36.0 g/dL   RDW 14.4 11.5 - 15.5 %   Platelets 169 150 - 400 K/uL   nRBC 0.0 0.0 - 0.2 %   Neutrophils Relative % 72 %   Neutro Abs 8.3 (H) 1.7 - 7.7 K/uL   Lymphocytes Relative 16 %   Lymphs Abs 1.8 0.7 - 4.0 K/uL   Monocytes Relative 10 %   Monocytes Absolute 1.2 (H) 0.1 - 1.0 K/uL   Eosinophils Relative 1 %   Eosinophils Absolute 0.1 0.0 - 0.5 K/uL   Basophils Relative 0 %   Basophils Absolute 0.0 0.0 - 0.1 K/uL   Immature Granulocytes 1 %   Abs Immature Granulocytes 0.06 0.00 - 0.07 K/uL    Comment: Performed at [facility] [address], [place], [place] [address]  CK     Status: Abnormal   Collection Time: 10/16/18 12:20 PM  Result Value Ref Range   Total CK 1,706 (H) 38 - 234 U/L    Comment: Performed at [facility], [place] [address], [place], [place] [address]  Urinalysis, Routine w reflex microscopic     Status: Abnormal   Collection Time: 10/16/18  2:07 PM  Result Value Ref Range   Color, Urine YELLOW YELLOW   APPearance CLOUDY (A) CLEAR   Specific Gravity, Urine 1.015 1.005 - 1.030   pH 7.0 5.0 - 8.0   Glucose, UA >=500 (A) NEGATIVE mg/dL   Hgb urine dipstick MODERATE (A) NEGATIVE   Bilirubin Urine NEGATIVE NEGATIVE   Ketones, ur 5 (A) NEGATIVE mg/dL   Protein, ur 30 (A) NEGATIVE mg/dL   Nitrite NEGATIVE NEGATIVE   Leukocytes,Ua  TRACE (A) NEGATIVE   RBC / HPF 0-5 0 - 5 RBC/hpf   WBC, UA 11-20 0 -  5 WBC/hpf   Bacteria, UA NONE SEEN NONE SEEN   Squamous Epithelial / LPF 0-5 0 - 5    Comment: Performed at [facility], [place] [address], [place], [place] [address]   Dg Lumbar Spine Complete  Result Date: 10/16/2018 CLINICAL DATA:  Chronic lower back pain. EXAM: LUMBAR SPINE - COMPLETE 4+ VIEW COMPARISON:  CT scan of May 20, 2017. FINDINGS: No fracture or spondylolisthesis is noted. Severe degenerative disc disease is noted at L2-3 with anterior osteophyte formation. Remaining disc spaces are unremarkable. Atherosclerosis of abdominal aorta is noted. IMPRESSION: Severe degenerative disc disease is noted at L2-3. No acute abnormality seen in the lumbar spine. Aortic Atherosclerosis (ICD10-I70.0). Electronically Signed   By: Marijo Conception M.D.   On: 10/16/2018 13:10   Ct Head Wo Contrast  Result Date: 10/16/2018 CLINICAL DATA:  85 year old female status post fall last night EXAM: CT HEAD WITHOUT CONTRAST CT CERVICAL SPINE WITHOUT CONTRAST TECHNIQUE: Multidetector CT imaging of the head and cervical spine was performed following the standard protocol without intravenous contrast. Multiplanar CT image reconstructions of the cervical spine were also generated. COMPARISON:  Prior head CT 12/28/2017 FINDINGS: CT HEAD FINDINGS Brain: No evidence of acute infarction, hemorrhage, hydrocephalus, extra-axial collection or mass lesion/mass effect. Central, cerebral and cerebellar cortical atrophy. Ex vacuo ventriculomegaly, stable. Moderate periventricular white matter hypoattenuation consistent with chronic microvascular ischemic white matter disease. Vascular: Atherosclerotic calcifications throughout the bilateral cavernous and supraclinoid carotid arteries. Skull: Normal. Negative for fracture or focal lesion. Sinuses/Orbits: Mild mucoperiosteal thickening throughout the ethmoid air cells with moderate disease in the sphenoid air  cell. Other: None. CT CERVICAL SPINE FINDINGS Alignment: Normal. Skull base and vertebrae: No acute fracture. No primary bone lesion or focal pathologic process. Soft tissues and spinal canal: No prevertebral fluid or swelling. No visible canal hematoma. Disc levels: Extensive multilevel degenerative disc disease beginning at C4-C5 and extending into the upper thoracic spine. The most severe degenerative changes are at C5-C6 and C6-C7. Multilevel bilateral facet arthropathy is also present including ankylosis of the posterior elements of C2 and C3 on the right and ankylosis of the posterior elements on the left at C4-C5 and C5-C6. Upper chest: Negative. Other: None. IMPRESSION: CT HEAD 1. No acute intracranial abnormality. 2. Atrophy and chronic microvascular ischemic white matter disease. 3. Mild inflammatory paranasal sinus disease. CT CSPINE 1. No acute fracture or malalignment. 2. Advanced multilevel degenerative disc disease and facet arthropathy. Electronically Signed   By: Jacqulynn Cadet M.D.   On: 10/16/2018 11:59   Ct Cervical Spine Wo Contrast  Result Date: 10/16/2018 CLINICAL DATA:  85 year old female status post fall last night EXAM: CT HEAD WITHOUT CONTRAST CT CERVICAL SPINE WITHOUT CONTRAST TECHNIQUE: Multidetector CT imaging of the head and cervical spine was performed following the standard protocol without intravenous contrast. Multiplanar CT image reconstructions of the cervical spine were also generated. COMPARISON:  Prior head CT 12/28/2017 FINDINGS: CT HEAD FINDINGS Brain: No evidence of acute infarction, hemorrhage, hydrocephalus, extra-axial collection or mass lesion/mass effect. Central, cerebral and cerebellar cortical atrophy. Ex vacuo ventriculomegaly, stable. Moderate periventricular white matter hypoattenuation consistent with chronic microvascular ischemic white matter disease. Vascular: Atherosclerotic calcifications throughout the bilateral cavernous and supraclinoid carotid  arteries. Skull: Normal. Negative for fracture or focal lesion. Sinuses/Orbits: Mild mucoperiosteal thickening throughout the ethmoid air cells with moderate disease in the sphenoid air cell. Other: None. CT CERVICAL SPINE FINDINGS Alignment: Normal. Skull base and vertebrae: No acute fracture. No primary bone lesion or focal  pathologic process. Soft tissues and spinal canal: No prevertebral fluid or swelling. No visible canal hematoma. Disc levels: Extensive multilevel degenerative disc disease beginning at C4-C5 and extending into the upper thoracic spine. The most severe degenerative changes are at C5-C6 and C6-C7. Multilevel bilateral facet arthropathy is also present including ankylosis of the posterior elements of C2 and C3 on the right and ankylosis of the posterior elements on the left at C4-C5 and C5-C6. Upper chest: Negative. Other: None. IMPRESSION: CT HEAD 1. No acute intracranial abnormality. 2. Atrophy and chronic microvascular ischemic white matter disease. 3. Mild inflammatory paranasal sinus disease. CT CSPINE 1. No acute fracture or malalignment. 2. Advanced multilevel degenerative disc disease and facet arthropathy. Electronically Signed   By: Jacqulynn Cadet M.D.   On: 10/16/2018 11:59   Ct Pelvis Wo Contrast  Result Date: 10/16/2018 CLINICAL DATA:  Found down this morning.  Right hip pain. EXAM: CT PELVIS WITHOUT CONTRAST TECHNIQUE: Multidetector CT imaging of the pelvis was performed following the standard protocol without intravenous contrast. COMPARISON:  None. FINDINGS: Urinary Tract: The bladder is unremarkable. No bladder mass or calculi. Bowel: The visualized colon and small bowel are unremarkable. No mass or obstructive findings. Vascular/Lymphatic: Moderate atherosclerotic calcifications involving the lower aorta and iliac arteries but no aneurysm. No retroperitoneal or pelvic lymphadenopathy or mass. Reproductive:  The uterus and ovaries are unremarkable. Other: No free pelvic  fluid collections. No inguinal mass or adenopathy. Musculoskeletal: Bilateral hip arthroplasties are noted with associated significant artifact. No periprosthetic fractures identified. No evidence of loosening. The pubic symphysis and SI joints are intact. Moderate degenerative changes. No pelvic fractures or bone lesions. No obvious muscle tear or hematoma. No subcutaneous fluid collections or hematoma. IMPRESSION: 1. Intact bilateral total hip arthroplasties without complicating features. 2. No pelvic fractures are identified. 3. No significant intrapelvic abnormalities. Electronically Signed   By: Marijo Sanes M.D.   On: 10/16/2018 15:23   Dg Knee Complete 4 Views Right  Result Date: 10/16/2018 CLINICAL DATA:  Patient status post fall. EXAM: RIGHT KNEE - COMPLETE 4+ VIEW COMPARISON:  None. FINDINGS: Medial compartment joint space narrowing. Tricompartmental osteophytosis. Normal anatomic alignment. No evidence for acute fracture or dislocation. No joint effusion. IMPRESSION: Tricompartmental degenerative changes. No acute osseous abnormality. Electronically Signed   By: Lovey Newcomer M.D.   On: 10/16/2018 11:08   Dg Hip Unilat W Or Wo Pelvis 2-3 Views Right  Result Date: 10/16/2018 CLINICAL DATA:  Right hip pain after fall last night. EXAM: DG HIP (WITH OR WITHOUT PELVIS) 2-3V RIGHT COMPARISON:  None. FINDINGS: Prior bilateral total hip arthroplasties. No evidence of hardware failure or loosening. Prominent heterotopic ossification about the left hip. No acute fracture or dislocation. IMPRESSION: 1.  No acute osseous abnormality. 2. Prior bilateral total hip arthroplasties without hardware complication. Electronically Signed   By: Titus Dubin M.D.   On: 10/16/2018 11:07    Pending Labs Unresulted Labs (From admission, onward)    Start     Ordered   10/17/18 0347  Basic metabolic panel  Tomorrow morning,   R     10/16/18 1613   10/17/18 0500  CBC  Tomorrow morning,   R     10/16/18 1613    10/17/18 0500  CK  Tomorrow morning,   R     10/16/18 1613   10/16/18 1627  SARS Coronavirus 2 (CEPHEID - Performed in [facility] hospital lab), Hosp Order  (Asymptomatic Patients Labs)  Once,   STAT  Question:  Rule Out  Answer:  Yes   10/16/18 1626   10/16/18 1547  Urine culture  ONCE - STAT,   STAT     10/16/18 1546          Vitals/Pain Today's Vitals   10/16/18 1028 10/16/18 1030 10/16/18 1344 10/16/18 1651  BP:  (!) 166/63 (!) 164/66 (!) 148/58  Pulse:  83 89 93  Resp:  (!) 24 14 18   Temp:      TempSrc:      SpO2:  92% 95% 94%  Weight:      Height:      PainSc: 6        Isolation Precautions No active isolations  Medications Medications  aspirin EC tablet 81 mg (has no administration in time range)  carvedilol (COREG CR) 24 hr capsule 80 mg (has no administration in time range)  ezetimibe-simvastatin (VYTORIN) 10-40 MG per tablet 1 tablet (has no administration in time range)  Insulin Detemir (LEVEMIR) FlexPen 60 Units (has no administration in time range)  acetaminophen (TYLENOL) tablet 650 mg (has no administration in time range)    Or  acetaminophen (TYLENOL) suppository 650 mg (has no administration in time range)  docusate sodium (COLACE) capsule 100 mg (has no administration in time range)  ondansetron (ZOFRAN) tablet 4 mg (has no administration in time range)    Or  ondansetron (ZOFRAN) injection 4 mg (has no administration in time range)  insulin aspart (novoLOG) injection 0-15 Units (has no administration in time range)  enoxaparin (LOVENOX) injection 30 mg (has no administration in time range)  lactated ringers infusion (has no administration in time range)  insulin aspart (novoLOG) injection 0-5 Units (has no administration in time range)  sodium chloride 0.9 % bolus 500 mL (0 mLs Intravenous Stopped 10/16/18 1549)    Mobility walks with device High fall risk   Focused Assessments    R Recommendations: See Admitting Provider Note  Report  given to:   Additional Notes:

## 2018-10-16 NOTE — Progress Notes (Addendum)
NEW ADMISSION NOTE New Admission Note:   Arrival Method:  Stretcher Mental Orientation: Alert to self. Telemetry: Not ordered. Assessment: Indorsed Skin: Stage on left cheek buttocks and scab over wound on left lower quadrant of stomach. RJ:JOAC hand Pain: none Tubes:  PIV left hand Safety Measures: Safety Fall Prevention Plan has been given, discussed and signed Admission: Indorsed [facility] [place] Orientation: Patient has been orientated to the room, unit and staff.  Family:None  Orders have been reviewed and implemented. Will continue to monitor the patient. Call light has been placed within reach and bed alarm has been activated.   [place], Zenon Mayo, RN

## 2018-10-16 NOTE — ED Notes (Signed)
Patient transported to CT 

## 2018-10-17 DIAGNOSIS — I5032 Chronic diastolic (congestive) heart failure: Secondary | ICD-10-CM | POA: Diagnosis not present

## 2018-10-17 DIAGNOSIS — G4733 Obstructive sleep apnea (adult) (pediatric): Secondary | ICD-10-CM | POA: Diagnosis present

## 2018-10-17 DIAGNOSIS — D696 Thrombocytopenia, unspecified: Secondary | ICD-10-CM | POA: Diagnosis present

## 2018-10-17 DIAGNOSIS — E860 Dehydration: Secondary | ICD-10-CM | POA: Diagnosis present

## 2018-10-17 DIAGNOSIS — Z9119 Patient's noncompliance with other medical treatment and regimen: Secondary | ICD-10-CM | POA: Diagnosis not present

## 2018-10-17 DIAGNOSIS — Z20828 Contact with and (suspected) exposure to other viral communicable diseases: Secondary | ICD-10-CM | POA: Diagnosis present

## 2018-10-17 DIAGNOSIS — Z79899 Other long term (current) drug therapy: Secondary | ICD-10-CM | POA: Diagnosis not present

## 2018-10-17 DIAGNOSIS — L989 Disorder of the skin and subcutaneous tissue, unspecified: Secondary | ICD-10-CM | POA: Diagnosis present

## 2018-10-17 DIAGNOSIS — Z6836 Body mass index (BMI) 36.0-36.9, adult: Secondary | ICD-10-CM | POA: Diagnosis not present

## 2018-10-17 DIAGNOSIS — I452 Bifascicular block: Secondary | ICD-10-CM | POA: Diagnosis present

## 2018-10-17 DIAGNOSIS — M069 Rheumatoid arthritis, unspecified: Secondary | ICD-10-CM | POA: Diagnosis present

## 2018-10-17 DIAGNOSIS — E875 Hyperkalemia: Secondary | ICD-10-CM | POA: Diagnosis present

## 2018-10-17 DIAGNOSIS — N183 Chronic kidney disease, stage 3 (moderate): Secondary | ICD-10-CM | POA: Diagnosis present

## 2018-10-17 DIAGNOSIS — E1122 Type 2 diabetes mellitus with diabetic chronic kidney disease: Secondary | ICD-10-CM | POA: Diagnosis present

## 2018-10-17 DIAGNOSIS — G3184 Mild cognitive impairment, so stated: Secondary | ICD-10-CM | POA: Diagnosis present

## 2018-10-17 DIAGNOSIS — E785 Hyperlipidemia, unspecified: Secondary | ICD-10-CM | POA: Diagnosis present

## 2018-10-17 DIAGNOSIS — Z7982 Long term (current) use of aspirin: Secondary | ICD-10-CM | POA: Diagnosis not present

## 2018-10-17 DIAGNOSIS — M6282 Rhabdomyolysis: Secondary | ICD-10-CM | POA: Diagnosis present

## 2018-10-17 DIAGNOSIS — R55 Syncope and collapse: Secondary | ICD-10-CM | POA: Diagnosis present

## 2018-10-17 DIAGNOSIS — M79604 Pain in right leg: Secondary | ICD-10-CM | POA: Diagnosis present

## 2018-10-17 DIAGNOSIS — Z794 Long term (current) use of insulin: Secondary | ICD-10-CM | POA: Diagnosis not present

## 2018-10-17 DIAGNOSIS — Z96643 Presence of artificial hip joint, bilateral: Secondary | ICD-10-CM | POA: Diagnosis present

## 2018-10-17 DIAGNOSIS — Z66 Do not resuscitate: Secondary | ICD-10-CM | POA: Diagnosis present

## 2018-10-17 DIAGNOSIS — I130 Hypertensive heart and chronic kidney disease with heart failure and stage 1 through stage 4 chronic kidney disease, or unspecified chronic kidney disease: Secondary | ICD-10-CM | POA: Diagnosis present

## 2018-10-17 DIAGNOSIS — W19XXXA Unspecified fall, initial encounter: Secondary | ICD-10-CM | POA: Diagnosis not present

## 2018-10-17 LAB — BASIC METABOLIC PANEL
Anion gap: 9 (ref 5–15)
BUN: 30 mg/dL — ABNORMAL HIGH (ref 8–23)
CO2: 25 mmol/L (ref 22–32)
Calcium: 8.6 mg/dL — ABNORMAL LOW (ref 8.9–10.3)
Chloride: 104 mmol/L (ref 98–111)
Creatinine, Ser: 1.64 mg/dL — ABNORMAL HIGH (ref 0.44–1.00)
GFR calc Af Amer: 33 mL/min — ABNORMAL LOW (ref >60)
GFR calc non Af Amer: 28 mL/min — ABNORMAL LOW (ref >60)
Glucose, Bld: 320 mg/dL — ABNORMAL HIGH (ref 70–99)
Potassium: 4.3 mmol/L (ref 3.5–5.1)
Sodium: 138 mmol/L (ref 135–145)

## 2018-10-17 LAB — CBC
HCT: 35.9 % — ABNORMAL LOW (ref 36.0–46.0)
Hemoglobin: 11.2 g/dL — ABNORMAL LOW (ref 12.0–15.0)
MCH: 30.7 pg (ref 26.0–34.0)
MCHC: 31.2 g/dL (ref 30.0–36.0)
MCV: 98.4 fL (ref 80.0–100.0)
Platelets: 180 10*3/uL (ref 150–400)
RBC: 3.65 MIL/uL — ABNORMAL LOW (ref 3.87–5.11)
RDW: 14.6 % (ref 11.5–15.5)
WBC: 10.2 10*3/uL (ref 4.0–10.5)
nRBC: 0 % (ref 0.0–0.2)

## 2018-10-17 LAB — GLUCOSE, CAPILLARY
Glucose-Capillary: 282 mg/dL — ABNORMAL HIGH (ref 70–99)
Glucose-Capillary: 334 mg/dL — ABNORMAL HIGH (ref 70–99)
Glucose-Capillary: 257 mg/dL — ABNORMAL HIGH (ref 70–99)
Glucose-Capillary: 276 mg/dL — ABNORMAL HIGH (ref 70–99)

## 2018-10-17 LAB — MRSA PCR SCREENING: MRSA by PCR: NEGATIVE

## 2018-10-17 LAB — CK: Total CK: 817 U/L — ABNORMAL HIGH (ref 38–234)

## 2018-10-17 NOTE — Progress Notes (Signed)
[facility] PROGRESS NOTE  Brandi Melton NWG:[id number] DOB: [birth year] DOA: 10/16/2018 PCP: Haywood Pao, MD  P Mechanical fall assisted All imaging negative-patient will need skilled level care however she is refusing long discussion with daughter see below-going to habits would with full supportive home health Mild cognitive issues Probable dementia versus unmasking of the same secondary to being outside of her usual environment She perseverates on various things and is tangential Monitor, sleeps cycle wakefulness, lights on during the day Compensated heart failure as below Demadex 20 on hold-continue Coreg CR 80 mg-monitor on telemetry as this is an extraordinarily high dose-may need orthostatic vital signs Weight is below her baseline of 230 Diabetes mellitus type 2 on insulin Continue Levemir 60, moderate to resistant sliding scale CBGs = 2 80-3 30 so may need to adjust sliding scale upwards in a.m. Rheumatoid arthritis Apparently not on meds?-Outpatient antero-anti-La and CCP if indicated HTN Coreg as above OSA noncompliant on CPAP BMI >30 Increased mortality in this subset population Skin lesion Unclear etiology needs follow-up with dermatologist should be coordinated as an outpatient Mild thrombocytopenia unclear cause-monitor Leukocytosis-likely secondary to hemolyzed labs being    --- Synopsis 85 year old [place]?-Resident [facility] facility past 2 years Mild cognitive deficit Compensated HFpEF-last EF 55-60% grade 1 dysfunction mild MR 04/05/2017, CKD 2, HTN, HLD Rh arthritis Sleep apnea not on BiPAP and noncompliant Bilateral hip replacements DM TY 2-insulin-dependent Body mass index is 36.58 kg/m.-It looks like her baseline weight is around 230  Prior admissions 02/2017 for volume overload,  03/2017 hypoxic respiratory failure secondary to possible COPD inclusive of confusion  Had assisted fall at the facility 7/4 was  found down on the ground and was found to have rhabdo when she came in-gently rehydrated    DVT Lovenox Code Status: DNR Communication: Long discussion with daughter explained patient's reticence to go to skilled facility-patient keeps stating she wants to go home-daughter agrees will give MAC support at [facility] would with therapy Disposition Plan: Likely discharge in a.m. 7/6 cannot coordinate for today   Verlon Au, MD  [facility] [address] [place] [email or web address] 7PM-7AM contact night coverage as above 10/17/2018, 3:33 PM  LOS: 0 days  ---  Consultants:  None  Procedures:  No  Antimicrobials:  None --- Today Tangential and talks off track Cannot get a clear answer out of her however she is pleasant noncombative She does recall that she has multiple dentists in her family and that her grandson is a doctor No other issues really Nursing reports tolerating diet  O  Vitals:  Vitals:   10/17/18 0613 10/17/18 0911  BP: (!) 118/42 (!) 132/50  Pulse: 72 79  Resp: (!) 21 18  Temp: 98.1 F (36.7 C) 98.7 F (37.1 C)  SpO2: 91% (!) 89%    Exam:  Morbidly obese Caucasian female, ruddy cheeks, no JVD, no bruit, neck soft supple cannot appreciate thyromegaly submandibular lymphadenopathy Chest clear S1-S2 rate controlled RRR Abdomen obese right subcostal long scar consistent with open cholecystectomy Center of the abdomen has a dark demarcated scar that looks like it may be either a melanoma or hyperkeratosis-daughter did not know about this No lower extremity edema   I have personally reviewed the following:   DATA BUN/creatinine stable in the 30/1.6 range Hyperkalemia was furious from yesterday is currently 4.3 CK is down from 176--->_0 WBC 11.4-->10.2 Platelets 180 hemoglobin 11    Scheduled Meds: . aspirin EC  81 mg Oral Daily  .  carvedilol  80 mg Oral Daily  . docusate sodium  100 mg Oral BID  . enoxaparin (LOVENOX) injection  30 mg  Subcutaneous Q24H  . ezetimibe-simvastatin  1 tablet Oral QHS  . insulin aspart  0-15 Units Subcutaneous TID WC  . insulin aspart  0-5 Units Subcutaneous QHS  . insulin detemir  60 Units Subcutaneous QHS   Continuous Infusions: . lactated ringers 50 mL/hr at 10/16/18 1841    Principal Problem:   Fall Active Problems:   Diabetes mellitus type 2, insulin dependent ([place])   Hypertension   Sleep apnea   CKD (chronic kidney disease), stage III (H[facility])   Chronic diastolic CHF (congestive heart failure) ([place])   Morbid obesity ([place])   LOS: 0 days

## 2018-10-17 NOTE — Evaluation (Signed)
Physical Therapy Evaluation Patient Details Name: Brandi Melton MRN: [id number] DOB: 1930-04-30 Today's Date: 10/17/2018   History of Present Illness  85 y.o. female with medical history significant of DM, OSA not on CPAP, RA, Bell's palsy, morbid obesity (BMI 37), HLD, stage 3 CKD, and chronic diastolic CHF presenting after a fall. Significant pelvic pain, no fractures.   Clinical Impression   Pt admitted with above diagnosis. Pt currently with functional limitations due to the deficits listed below (see PT Problem List). Managing in her independent living apartment with daily assist from [facility]; Presents with decr functional mobility, decr balance, fall risk;  Pt will benefit from skilled PT to increase their independence and safety with mobility to allow discharge to the venue listed below.       Follow Up Recommendations SNF    Equipment Recommendations  3in1 (PT)    Recommendations for Other Services OT consult     Precautions / Restrictions Precautions Precautions: Fall      Mobility  Bed Mobility Overal bed mobility: Needs Assistance Bed Mobility: Supine to Sit     Supine to sit: Min guard;HOB elevated     General bed mobility comments: Min Guard A for safety. Pt using bedrails to pull over to EOB.   Transfers Overall transfer level: Needs assistance Equipment used: Rolling walker (2 wheeled) Transfers: Sit to/from Stand Sit to Stand: Min assist;Mod assist         General transfer comment: Mod A for power up from EOB. Pt requiring Min A for power up from recliner.  Ambulation/Gait Ambulation/Gait assistance: Min assist;+2 safety/equipment Gait Distance (Feet): 25 [id number]) Assistive device: Rolling walker (2 wheeled) Gait Pattern/deviations: Decreased step length - right;Decreased step length - left;Decreased stride length;Trunk flexed     General Gait Details: Noting posterior lean with walking that improved some with cues and practice; two bouts of  walking; Required encouragement to walk  Stairs            Wheelchair Mobility    Modified Rankin (Stroke Patients Only)       Balance     Sitting balance-Leahy Scale: Fair     Standing balance support: During functional activity;Single extremity supported Standing balance-Leahy Scale: Poor Standing balance comment: reliant on atleast one UE or physical A for maintaining balance                             Pertinent Vitals/Pain Pain Assessment: Faces Faces Pain Scale: No hurt    Home Living Family/patient expects to be discharged to:: Assisted living               Home Equipment: Walker - 4 wheels;Shower seat;Grab bars - tub/shower Additional Comments: [facility]    Prior Function Level of Independence: Needs assistance   Gait / Transfers Assistance Needed: Uses a rollator for functional mobility  ADL's / Homemaking Assistance Needed: Aide who comes in the morning to assist with bathing and dressing. [facility] prepares meals and brings them to pt's apartment        Hand Dominance   Dominant Hand: Right    Extremity/Trunk Assessment   Upper Extremity Assessment Upper Extremity Assessment: Defer to OT evaluation    Lower Extremity Assessment Lower Extremity Assessment: Generalized weakness RLE Deficits / Details: Reporting pain at right hip. CT negative    Cervical / Trunk Assessment Cervical / Trunk Assessment: Kyphotic  Communication   Communication: No difficulties  Cognition Arousal/Alertness:  Awake/alert Behavior During Therapy: WFL for tasks assessed/performed Overall Cognitive Status: Within Functional Limits for tasks assessed                                 General Comments: Feel pt is at her baseline for cognition. During orientation question, pt initially stating "July 20th". Provided cues of yesterday was a holiday and she was able to problem solve that today is July 5th. Pt also with  decreased awareness of deficits and adamant about return to her apartment (withotu increased support).      General Comments General comments (skin integrity, edema, etc.): Incontinent of urine    Exercises     Assessment/Plan    PT Assessment Patient needs continued PT services  PT Problem List Decreased strength;Decreased activity tolerance;Decreased balance;Decreased mobility;Decreased coordination;Decreased cognition;Decreased knowledge of use of DME;Decreased knowledge of precautions;Decreased safety awareness       PT Treatment Interventions DME instruction;Gait training;Functional mobility training;Therapeutic activities;Therapeutic exercise;Balance training;Neuromuscular re-education;Cognitive remediation;Patient/family education    PT Goals (Current goals can be found in the Care Plan section)  Acute Rehab PT Goals Patient Stated Goal: "Go back to my apartment today" PT Goal Formulation: With patient Time For Goal Achievement: 10/31/18 Potential to Achieve Goals: Good    Frequency Min 2X/week   Barriers to discharge        Co-evaluation PT/OT/SLP Co-Evaluation/Treatment: Yes Reason for Co-Treatment: For patient/therapist safety;To address functional/ADL transfers PT goals addressed during session: Mobility/safety with mobility OT goals addressed during session: ADL's and self-care       AM-PAC PT "6 Clicks" Mobility  Outcome Measure Help needed turning from your back to your side while in a flat bed without using bedrails?: A Little Help needed moving from lying on your back to sitting on the side of a flat bed without using bedrails?: A Little Help needed moving to and from a bed to a chair (including a wheelchair)?: A Little Help needed standing up from a chair using your arms (e.g., wheelchair or bedside chair)?: A Little Help needed to walk in hospital room?: A Lot Help needed climbing 3-5 steps with a railing? : A Lot 6 Click Score: 16    End of Session  Equipment Utilized During Treatment: Gait belt Activity Tolerance: Patient tolerated treatment well Patient left: in chair;with call bell/phone within reach;with chair alarm set Nurse Communication: Mobility status PT Visit Diagnosis: Muscle weakness (generalized) (M62.81);Other abnormalities of gait and mobility (R26.89)    Time: 6578-4696 PT Time Calculation (min) (ACUTE ONLY): 30 min   Charges:   PT Evaluation $PT Eval Moderate Complexity: 1 Mod          Roney [place], [place]  Acute Rehabilitation Services Pager [phone] Office [phone]   [place] 10/17/2018, 2:34 PM

## 2018-10-17 NOTE — Evaluation (Signed)
Occupational Therapy Evaluation Patient Details Name: Brandi Melton MRN: [id number] DOB: Apr 28, 1930 Today's Date: 10/17/2018    History of Present Illness 85 y.o. female with medical history significant of DM, OSA not on CPAP, RA, Bell's palsy, morbid obesity (BMI 37), HLD, stage 3 CKD, and chronic diastolic CHF presenting after a fall. Significant pelvic pain, no fractures.    Clinical Impression   PTA, pt was living alone at her apartment with [place], had an aide who assisted with bathing/dressing each morning, and used a Rollator for functional mobility. Pt currently requiring Min A for UB ADLs, Mod-Max A for LB ADLs and Min-Mod A for functional mobility with RW. Pt presenting with decreased strength, poor balance with posterior lean in standing, and decreased awareness of deficits. Discussed with pt dc recommendation from PT/OT and pt reporting she wants to go home and not rehab; then discussed increased assistance for 24/7 at her apartment and she reported she did not want increased assistance. Pt would benefit from further acute OT to facilitate safe dc. Recommend dc to SNF for further OT to optimize safety, independence with ADLs, and return to PLOF.      Follow Up Recommendations  SNF;Supervision/Assistance - 24 hour    Equipment Recommendations  Other (comment)(Defer to next venue)    Recommendations for Other Services PT consult     Precautions / Restrictions Precautions Precautions: Fall Restrictions Weight Bearing Restrictions: No      Mobility Bed Mobility Overal bed mobility: Needs Assistance Bed Mobility: Supine to Sit     Supine to sit: Min guard;HOB elevated     General bed mobility comments: Min Guard A for safety. Pt using bedrails to pull over to EOB.   Transfers Overall transfer level: Needs assistance Equipment used: Rolling walker (2 wheeled) Transfers: Sit to/from Stand Sit to Stand: Min assist;Mod assist         General  transfer comment: Mod A for power up from EOB. Pt requiring Min A for power up from recliner.    Balance Overall balance assessment: Needs assistance Sitting-balance support: No upper extremity supported;Feet supported Sitting balance-Leahy Scale: Fair     Standing balance support: During functional activity;Single extremity supported Standing balance-Leahy Scale: Poor Standing balance comment: reliant on atleast one UE or physical A for maintaining balance                           ADL either performed or assessed with clinical judgement   ADL Overall ADL's : Needs assistance/impaired Eating/Feeding: Independent;Sitting   Grooming: Wash/dry face;Minimal assistance;Standing Grooming Details (indicate cue type and reason): Pt requiring Min A for safety dur to posterior lean in standing. several moments with required Min A to prevent backwards fall Upper Body Bathing: Minimal assistance;Sitting   Lower Body Bathing: Moderate assistance;Sit to/from stand Lower Body Bathing Details (indicate cue type and reason): Pt sitting in recliner to wash legs and peri area Upper Body Dressing : Minimal assistance;Sitting   Lower Body Dressing: Maximal assistance;Sit to/from stand Lower Body Dressing Details (indicate cue type and reason): Max A for managing socks Toilet Transfer: Moderate assistance;Ambulation;RW;Minimal assistance;+2 for safety/equipment(Simulated to recliner) Toilet Transfer Details (indicate cue type and reason): Min-Mod A for RW management and balance with noted posterior lean during mobility and standing         Functional mobility during ADLs: Minimal assistance;Moderate assistance;Rolling walker;+2 for safety/equipment General ADL Comments: Pt presenting with decreased balance, strength, and awareness  Vision         Perception     Praxis      Pertinent Vitals/Pain Pain Assessment: Faces Faces Pain Scale: No hurt Pain Intervention(s): Monitored  during session     Hand Dominance Right   Extremity/Trunk Assessment Upper Extremity Assessment Upper Extremity Assessment: Generalized weakness   Lower Extremity Assessment Lower Extremity Assessment: Defer to PT evaluation;RLE deficits/detail RLE Deficits / Details: Reporting pain at right hip. CT negative   Cervical / Trunk Assessment Cervical / Trunk Assessment: Kyphotic   Communication Communication Communication: No difficulties   Cognition Arousal/Alertness: Awake/alert Behavior During Therapy: WFL for tasks assessed/performed Overall Cognitive Status: Within Functional Limits for tasks assessed                                 General Comments: Feel pt is at her baseline for cognition. During orientation question, pt initially stating "July 20th". Provided cues of yesterday was a holiday and she was able to problem solve that today is July 5th. Pt also with decreased awareness of deficits and adamant about return to her apartment (withotu increased support).   General Comments       Exercises     Shoulder Instructions      Home Living Family/patient expects to be discharged to:: Assisted living                             Home Equipment: Walker - 4 wheels;Shower seat;Grab bars - tub/shower   Additional Comments: [facility]      Prior Functioning/Environment Level of Independence: Needs assistance  Gait / Transfers Assistance Needed: Uses a rollator for functional mobility ADL's / Homemaking Assistance Needed: Aide who comes in the morning to assist with bathing and dressing. [facility] prepares meals and brings them to pt's apartment            OT Problem List: Decreased strength;Decreased range of motion;Decreased activity tolerance;Impaired balance (sitting and/or standing);Decreased knowledge of use of DME or AE;Decreased knowledge of precautions;Decreased safety awareness      OT Treatment/Interventions:  Self-care/ADL training;Therapeutic exercise;Energy conservation;DME and/or AE instruction;Therapeutic activities;Patient/family education    OT Goals(Current goals can be found in the care plan section) Acute Rehab OT Goals Patient Stated Goal: "Go back to my apartment today" OT Goal Formulation: With patient Time For Goal Achievement: 10/31/18 Potential to Achieve Goals: Good  OT Frequency: Min 2X/week   Barriers to D/C:            Co-evaluation PT/OT/SLP Co-Evaluation/Treatment: Yes Reason for Co-Treatment: For patient/therapist safety;To address functional/ADL transfers   OT goals addressed during session: ADL's and self-care      AM-PAC OT "6 Clicks" Daily Activity     Outcome Measure Help from another person eating meals?: None Help from another person taking care of personal grooming?: A Little Help from another person toileting, which includes using toliet, bedpan, or urinal?: A Lot Help from another person bathing (including washing, rinsing, drying)?: A Lot Help from another person to put on and taking off regular upper body clothing?: A Little Help from another person to put on and taking off regular lower body clothing?: A Lot 6 Click Score: 16   End of Session Equipment Utilized During Treatment: Gait belt;Rolling walker Nurse Communication: Mobility status  Activity Tolerance: Patient tolerated treatment well Patient left: in chair;with call bell/phone within reach;with chair alarm  set  OT Visit Diagnosis: Unsteadiness on feet (R26.81);Other abnormalities of gait and mobility (R26.89);Muscle weakness (generalized) (M62.81);Other symptoms and signs involving cognitive function;History of falling (Z91.81)                Time: 4081-4481 OT Time Calculation (min): 27 min Charges:  OT General Charges $OT Visit: 1 Visit OT Evaluation $OT Eval Moderate Complexity: [place], OTR/L Acute Rehab Pager: [phone] Office: [place] 10/17/2018, 10:54 AM

## 2018-10-17 NOTE — TOC Progression Note (Signed)
Transition of Care [facility]) - Progression Note    Patient Details  Name: Brandi Melton MRN: [id number] Date of Birth: Apr 19, 1930  Transition of [facility]) CM/SW Contact  Bartholomew Crews, RN Phone Number: [phone] 10/17/2018, 5:34 PM  Clinical Narrative:    Spoke with daughter, Brandi Melton, and son-in-law, on the phone discussing plans for transition back to [place]. Discussed recommendations for SNF - family wants patient to return to [place]. Unable to talk to nursing or therapy services today. Spoke with MD who agreed best plan is for transition home tomorrow with services in place. Family aware of plans for transition home tomorrow and request that patient be ready at 12 pm.         Expected Discharge Plan and Services                                                 Social Determinants of Health (SDOH) Interventions    Readmission Risk Interventions No flowsheet data found.

## 2018-10-17 NOTE — Progress Notes (Signed)
LVM for [facility] to discuss transition home.   LVM for patient's daughter to discuss plans for transition home.   Manya Silvas, RN CM Transitions of Care [phone]

## 2018-10-18 LAB — GLUCOSE, CAPILLARY
Glucose-Capillary: 124 mg/dL — ABNORMAL HIGH (ref 70–99)
Glucose-Capillary: 179 mg/dL — ABNORMAL HIGH (ref 70–99)

## 2018-10-18 LAB — CBC WITH DIFFERENTIAL/PLATELET
Abs Immature Granulocytes: 0.07 10*3/uL (ref 0.00–0.07)
Basophils Absolute: 0 10*3/uL (ref 0.0–0.1)
Basophils Relative: 0 %
Eosinophils Absolute: 0.3 10*3/uL (ref 0.0–0.5)
Eosinophils Relative: 3 %
HCT: 35.9 % — ABNORMAL LOW (ref 36.0–46.0)
Hemoglobin: 11.1 g/dL — ABNORMAL LOW (ref 12.0–15.0)
Immature Granulocytes: 1 %
Lymphocytes Relative: 24 %
Lymphs Abs: 2.5 10*3/uL (ref 0.7–4.0)
MCH: 30.3 pg (ref 26.0–34.0)
MCHC: 30.9 g/dL (ref 30.0–36.0)
MCV: 98.1 fL (ref 80.0–100.0)
Monocytes Absolute: 1.1 10*3/uL — ABNORMAL HIGH (ref 0.1–1.0)
Monocytes Relative: 10 %
Neutro Abs: 6.4 10*3/uL (ref 1.7–7.7)
Neutrophils Relative %: 62 %
Platelets: 160 10*3/uL (ref 150–400)
RBC: 3.66 MIL/uL — ABNORMAL LOW (ref 3.87–5.11)
RDW: 14.4 % (ref 11.5–15.5)
WBC: 10.3 10*3/uL (ref 4.0–10.5)
nRBC: 0 % (ref 0.0–0.2)

## 2018-10-18 LAB — COMPREHENSIVE METABOLIC PANEL
ALT: 27 U/L (ref 0–44)
AST: 35 U/L (ref 15–41)
Albumin: 2.6 g/dL — ABNORMAL LOW (ref 3.5–5.0)
Alkaline Phosphatase: 77 U/L (ref 38–126)
Anion gap: 10 (ref 5–15)
BUN: 30 mg/dL — ABNORMAL HIGH (ref 8–23)
CO2: 26 mmol/L (ref 22–32)
Calcium: 8.6 mg/dL — ABNORMAL LOW (ref 8.9–10.3)
Chloride: 104 mmol/L (ref 98–111)
Creatinine, Ser: 1.51 mg/dL — ABNORMAL HIGH (ref 0.44–1.00)
GFR calc Af Amer: 36 mL/min — ABNORMAL LOW (ref >60)
GFR calc non Af Amer: 31 mL/min — ABNORMAL LOW (ref >60)
Glucose, Bld: 168 mg/dL — ABNORMAL HIGH (ref 70–99)
Potassium: 4.1 mmol/L (ref 3.5–5.1)
Sodium: 140 mmol/L (ref 135–145)
Total Bilirubin: 0.5 mg/dL (ref 0.3–1.2)
Total Protein: 6 g/dL — ABNORMAL LOW (ref 6.5–8.1)

## 2018-10-18 MED ORDER — TORSEMIDE 20 MG PO TABS: 40.0000 mg | ORAL_TABLET | Freq: Every day | ORAL | Status: DC

## 2018-10-18 NOTE — Discharge Summary (Signed)
Physician Discharge Summary  Brandi Melton W[facility]:[id number] DOB: 1930/10/30 DOA: 10/16/2018  PCP: Brandi Pao, MD  Admit date: 10/16/2018 Discharge date: 10/18/2018  Time spent: 25 minutes  Recommendations for Outpatient Follow-up:  1. Note dosage change of Demadex 60-->40 daily 2. Recommend Chem-7 and orthostatic checks in the next day or so dependent on blood pressures and ability to move around as she was slightly dehydrated at this admission 3. Would also recommend outpatient follow-up with cardiologist 4. Get A1c in the outpatient setting continue to upward adjust insulin doses based on sliding scale 5. Make sure that patient is followed for abdominal lesion on the anterior wall of the abdomen-this should be seen potentially by dermatologist  Discharge Diagnoses:  Principal Problem:   Fall Active Problems:   Diabetes mellitus type 2, insulin dependent ([place])   Hypertension   Sleep apnea   CKD (chronic kidney disease), stage III ([place])   Chronic diastolic CHF (congestive heart failure) ([place])   Morbid obesity ([place])   Syncope   Discharge Condition: Improved  Diet recommendation: Diabetic heart healthy  Filed Weights   10/16/18 1839 10/16/18 2050 10/17/18 2059  Weight: 99.7 kg 99.7 kg 99.6 kg    History of present illness:  84 year old [name] facility past 2 years Mild cognitive deficit Compensated HFpEF-last EF 55-60% grade 1 dysfunction mild MR 04/05/2017, CKD 2, HTN, HLD Rh arthritis Sleep apnea not on BiPAP and noncompliant Bilateral hip replacements DM TY 2-insulin-dependent Body mass index is 36.58 kg/m.-It looks like her baseline weight is around 230  Prior admissions 02/2017 for volume overload,  03/2017 hypoxic respiratory failure secondary to possible COPD inclusive of confusion  Had assisted fall at the facility 7/4 was found down on the ground and was found to have rhabdo when she came in-gently  rehydrated  Hospital Course:  Mechanical fall assisted All imaging negative-patient will need skilled level care however she is refusing long discussion with daughter see below-going to habits would with full supportive home health Mild cognitive issues Probable dementia versus unmasking of the same secondary to being outside of her usual environment She perseverates on various things and is tangential Monitor, sleeps cycle wakefulness, lights on during the day Compensated heart failure as below Demadex 20 on hold-continue Coreg CR 80 mg-monitor on telemetry as this is an extraordinarily high dose-may need orthostatic vital signs Weight is below her baseline of 230 Diabetes mellitus type 2 on insulin Continue Levemir 60, moderate to resistant sliding scale CBGs = 2 80-3 30 so may need to adjust sliding scale upwards in a.m. Rheumatoid arthritis Apparently not on meds?-Outpatient antero-anti-La and CCP if indicated HTN Coreg as above OSA noncompliant on CPAP BMI >30 Increased mortality in this subset population Skin lesion Unclear etiology needs follow-up with dermatologist should be coordinated as an outpatient Mild thrombocytopenia unclear cause-monitor Leukocytosis-likely secondary to hemolyzed labs being    Discharge Exam: Vitals:   10/17/18 2059 10/18/18 0503  BP: (!) 125/48 (!) 131/52  Pulse: 71 70  Resp: 16 17  Temp: 98.5 F (36.9 C) 98.3 F (36.8 C)  SpO2: 94% 92%    General: Awake alert coherent no distress, EOMI NCAT much more clear no confusion Cardiovascular: S1-S2 no murmur rub or gallop sinus rhythm on monitor Respiratory: Clinically clear no added sound no rales no rhonchi Abdomen soft has a lesion in the center no rebound no guarding  Discharge Instructions   Discharge Instructions    Diet - low sodium heart healthy  Complete by: As directed    Discharge instructions   Complete by: As directed    Would recommend that you get maximal home health in  the outpatient setting We have cut back a little bit your fluid medication to 40 mg because you had a little bit of dehydration this hospital stay I would recommend you get labs in the next week or so to ensure that you do not have any further issues with your kidneys-being dehydrated can cause you to feel dizzy and weak which is why I cut her back Please get home health and follow-up in the outpatient setting You have an area on your stomach that is suspicious and needs to be looked at by your primary care physician and/or dermatologist to ensure that this is not a malignancy-please ensure that this is followed up   Increase activity slowly   Complete by: As directed      Allergies as of 10/18/2018   No Known Allergies     Medication List    TAKE these medications   Accu-Chek Aviva Plus test strip Generic drug: glucose blood TEST BS QID   aspirin EC 81 MG tablet Take 81 mg by mouth daily.   calcium carbonate 750 MG chewable tablet Commonly known as: TUMS EX Chew 1 tablet by mouth daily.   carvedilol 80 MG 24 hr capsule Commonly known as: COREG CR TAKE 1 CAPSULE BY MOUTH  DAILY   chlorhexidine 0.12 % solution Commonly known as: PERIDEX Use as directed 5 mLs in the mouth or throat daily. Swish and spit   cholecalciferol 1000 units tablet Commonly known as: VITAMIN D Take 3,000 Units by mouth daily.   ezetimibe-simvastatin 10-40 MG tablet Commonly known as: VYTORIN TAKE 1 TABLET BY MOUTH  DAILY What changed: when to take this   Levemir FlexTouch 100 UNIT/ML Pen Generic drug: Insulin Detemir Inject 60 Units into the skin at bedtime.   magnesium oxide 400 MG tablet Commonly known as: MAG-OX Take 1 tablet (400 mg total) by mouth daily.   NovoLOG FlexPen 100 UNIT/ML FlexPen Generic drug: insulin aspart Inject 15 Units into the skin See admin instructions. Inject 15 units subcutaneously two to three times daily with meals   potassium chloride SA 20 MEQ tablet Commonly  known as: K-DUR TAKE 1 TABLET(20 MEQ) BY MOUTH DAILY What changed: See the new instructions.   torsemide 20 MG tablet Commonly known as: DEMADEX Take 2 tablets (40 mg total) by mouth daily. What changed: how much to take   Vitamin D (Ergocalciferol) 1.25 MG (50000 UT) Caps capsule Commonly known as: DRISDOL Take 50,000 Units by mouth every Wednesday.      No Known Allergies    The results of significant diagnostics from this hospitalization (including imaging, microbiology, ancillary and laboratory) are listed below for reference.    Significant Diagnostic Studies: Dg Lumbar Spine Complete  Result Date: 10/16/2018 CLINICAL DATA:  Chronic lower back pain. EXAM: LUMBAR SPINE - COMPLETE 4+ VIEW COMPARISON:  CT scan of May 20, 2017. FINDINGS: No fracture or spondylolisthesis is noted. Severe degenerative disc disease is noted at L2-3 with anterior osteophyte formation. Remaining disc spaces are unremarkable. Atherosclerosis of abdominal aorta is noted. IMPRESSION: Severe degenerative disc disease is noted at L2-3. No acute abnormality seen in the lumbar spine. Aortic Atherosclerosis (ICD10-I70.0). Electronically Signed   By: Marijo Conception M.D.   On: 10/16/2018 13:10   Ct Head Wo Contrast  Result Date: 10/16/2018 CLINICAL DATA:  85 year old female status  post fall last night EXAM: CT HEAD WITHOUT CONTRAST CT CERVICAL SPINE WITHOUT CONTRAST TECHNIQUE: Multidetector CT imaging of the head and cervical spine was performed following the standard protocol without intravenous contrast. Multiplanar CT image reconstructions of the cervical spine were also generated. COMPARISON:  Prior head CT 12/28/2017 FINDINGS: CT HEAD FINDINGS Brain: No evidence of acute infarction, hemorrhage, hydrocephalus, extra-axial collection or mass lesion/mass effect. Central, cerebral and cerebellar cortical atrophy. Ex vacuo ventriculomegaly, stable. Moderate periventricular white matter hypoattenuation consistent  with chronic microvascular ischemic white matter disease. Vascular: Atherosclerotic calcifications throughout the bilateral cavernous and supraclinoid carotid arteries. Skull: Normal. Negative for fracture or focal lesion. Sinuses/Orbits: Mild mucoperiosteal thickening throughout the ethmoid air cells with moderate disease in the sphenoid air cell. Other: None. CT CERVICAL SPINE FINDINGS Alignment: Normal. Skull base and vertebrae: No acute fracture. No primary bone lesion or focal pathologic process. Soft tissues and spinal canal: No prevertebral fluid or swelling. No visible canal hematoma. Disc levels: Extensive multilevel degenerative disc disease beginning at C4-C5 and extending into the upper thoracic spine. The most severe degenerative changes are at C5-C6 and C6-C7. Multilevel bilateral facet arthropathy is also present including ankylosis of the posterior elements of C2 and C3 on the right and ankylosis of the posterior elements on the left at C4-C5 and C5-C6. Upper chest: Negative. Other: None. IMPRESSION: CT HEAD 1. No acute intracranial abnormality. 2. Atrophy and chronic microvascular ischemic white matter disease. 3. Mild inflammatory paranasal sinus disease. CT CSPINE 1. No acute fracture or malalignment. 2. Advanced multilevel degenerative disc disease and facet arthropathy. Electronically Signed   By: Jacqulynn Cadet M.D.   On: 10/16/2018 11:59   Ct Cervical Spine Wo Contrast  Result Date: 10/16/2018 CLINICAL DATA:  85 year old female status post fall last night EXAM: CT HEAD WITHOUT CONTRAST CT CERVICAL SPINE WITHOUT CONTRAST TECHNIQUE: Multidetector CT imaging of the head and cervical spine was performed following the standard protocol without intravenous contrast. Multiplanar CT image reconstructions of the cervical spine were also generated. COMPARISON:  Prior head CT 12/28/2017 FINDINGS: CT HEAD FINDINGS Brain: No evidence of acute infarction, hemorrhage, hydrocephalus, extra-axial  collection or mass lesion/mass effect. Central, cerebral and cerebellar cortical atrophy. Ex vacuo ventriculomegaly, stable. Moderate periventricular white matter hypoattenuation consistent with chronic microvascular ischemic white matter disease. Vascular: Atherosclerotic calcifications throughout the bilateral cavernous and supraclinoid carotid arteries. Skull: Normal. Negative for fracture or focal lesion. Sinuses/Orbits: Mild mucoperiosteal thickening throughout the ethmoid air cells with moderate disease in the sphenoid air cell. Other: None. CT CERVICAL SPINE FINDINGS Alignment: Normal. Skull base and vertebrae: No acute fracture. No primary bone lesion or focal pathologic process. Soft tissues and spinal canal: No prevertebral fluid or swelling. No visible canal hematoma. Disc levels: Extensive multilevel degenerative disc disease beginning at C4-C5 and extending into the upper thoracic spine. The most severe degenerative changes are at C5-C6 and C6-C7. Multilevel bilateral facet arthropathy is also present including ankylosis of the posterior elements of C2 and C3 on the right and ankylosis of the posterior elements on the left at C4-C5 and C5-C6. Upper chest: Negative. Other: None. IMPRESSION: CT HEAD 1. No acute intracranial abnormality. 2. Atrophy and chronic microvascular ischemic white matter disease. 3. Mild inflammatory paranasal sinus disease. CT CSPINE 1. No acute fracture or malalignment. 2. Advanced multilevel degenerative disc disease and facet arthropathy. Electronically Signed   By: Jacqulynn Cadet M.D.   On: 10/16/2018 11:59   Ct Pelvis Wo Contrast  Result Date: 10/16/2018 CLINICAL DATA:  Found down  this morning.  Right hip pain. EXAM: CT PELVIS WITHOUT CONTRAST TECHNIQUE: Multidetector CT imaging of the pelvis was performed following the standard protocol without intravenous contrast. COMPARISON:  None. FINDINGS: Urinary Tract: The bladder is unremarkable. No bladder mass or calculi.  Bowel: The visualized colon and small bowel are unremarkable. No mass or obstructive findings. Vascular/Lymphatic: Moderate atherosclerotic calcifications involving the lower aorta and iliac arteries but no aneurysm. No retroperitoneal or pelvic lymphadenopathy or mass. Reproductive:  The uterus and ovaries are unremarkable. Other: No free pelvic fluid collections. No inguinal mass or adenopathy. Musculoskeletal: Bilateral hip arthroplasties are noted with associated significant artifact. No periprosthetic fractures identified. No evidence of loosening. The pubic symphysis and SI joints are intact. Moderate degenerative changes. No pelvic fractures or bone lesions. No obvious muscle tear or hematoma. No subcutaneous fluid collections or hematoma. IMPRESSION: 1. Intact bilateral total hip arthroplasties without complicating features. 2. No pelvic fractures are identified. 3. No significant intrapelvic abnormalities. Electronically Signed   By: Marijo Sanes M.D.   On: 10/16/2018 15:23   Dg Knee Complete 4 Views Right  Result Date: 10/16/2018 CLINICAL DATA:  Patient status post fall. EXAM: RIGHT KNEE - COMPLETE 4+ VIEW COMPARISON:  None. FINDINGS: Medial compartment joint space narrowing. Tricompartmental osteophytosis. Normal anatomic alignment. No evidence for acute fracture or dislocation. No joint effusion. IMPRESSION: Tricompartmental degenerative changes. No acute osseous abnormality. Electronically Signed   By: Lovey Newcomer M.D.   On: 10/16/2018 11:08   Dg Hip Unilat W Or Wo Pelvis 2-3 Views Right  Result Date: 10/16/2018 CLINICAL DATA:  Right hip pain after fall last night. EXAM: DG HIP (WITH OR WITHOUT PELVIS) 2-3V RIGHT COMPARISON:  None. FINDINGS: Prior bilateral total hip arthroplasties. No evidence of hardware failure or loosening. Prominent heterotopic ossification about the left hip. No acute fracture or dislocation. IMPRESSION: 1.  No acute osseous abnormality. 2. Prior bilateral total hip  arthroplasties without hardware complication. Electronically Signed   By: Titus Dubin M.D.   On: 10/16/2018 11:07    Microbiology: Recent Results (from the past 240 hour(s))  Urine culture     Status: None (Preliminary result)   Collection Time: 10/16/18  2:07 PM   Specimen: Urine, Random  Result Value Ref Range Status   Specimen Description URINE, RANDOM  Final   Special Requests NONE  Final   Culture   Final    CULTURE REINCUBATED FOR BETTER GROWTH Performed at [facility], [address], [place], [place] [address]    Report Status PENDING  Incomplete  SARS Coronavirus 2 (CEPHEID - Performed in [facility] hospital lab), Hosp Order     Status: None   Collection Time: 10/16/18  4:50 PM   Specimen: Nasopharyngeal Swab  Result Value Ref Range Status   SARS Coronavirus 2 NEGATIVE NEGATIVE Final    Comment: (NOTE) If result is NEGATIVE SARS-CoV-2 target nucleic acids are NOT DETECTED. The SARS-CoV-2 RNA is generally detectable in upper and lower  respiratory specimens during the acute phase of infection. The lowest  concentration of SARS-CoV-2 viral copies this assay can detect is 250  copies / mL. A negative result does not preclude SARS-CoV-2 infection  and should not be used as the sole basis for treatment or other  patient management decisions.  A negative result may occur with  improper specimen collection / handling, submission of specimen other  than nasopharyngeal swab, presence of viral mutation(s) within the  areas targeted by this assay, and inadequate number of viral  copies  (<250 copies / mL). A negative result must be combined with clinical  observations, patient history, and epidemiological information. If result is POSITIVE SARS-CoV-2 target nucleic acids are DETECTED. The SARS-CoV-2 RNA is generally detectable in upper and lower  respiratory specimens dur ing the acute phase of infection.  Positive  results are indicative of active infection with  SARS-CoV-2.  Clinical  correlation with patient history and other diagnostic information is  necessary to determine patient infection status.  Positive results do  not rule out bacterial infection or co-infection with other viruses. If result is PRESUMPTIVE POSTIVE SARS-CoV-2 nucleic acids MAY BE PRESENT.   A presumptive positive result was obtained on the submitted specimen  and confirmed on repeat testing.  While 2019 novel coronavirus  (SARS-CoV-2) nucleic acids may be present in the submitted sample  additional confirmatory testing may be necessary for epidemiological  and / or clinical management purposes  to differentiate between  SARS-CoV-2 and other Sarbecovirus currently known to infect humans.  If clinically indicated additional testing with an alternate test  methodology [phone]) is advised. The SARS-CoV-2 RNA is generally  detectable in upper and lower respiratory sp ecimens during the acute  phase of infection. The expected result is Negative. Fact Sheet for Patients:  StrictlyIdeas.no Fact Sheet for Healthcare Providers: BankingDealers.co.za This test is not yet approved or cleared by the [place] FDA and has been authorized for detection and/or diagnosis of SARS-CoV-2 by FDA under an Emergency Use Authorization (EUA).  This EUA will remain in effect (meaning this test can be used) for the duration of the COVID-19 declaration under Section 564(b)(1) of the Act, 21 U.S.C. section 360bbb-3(b)(1), unless the authorization is terminated or revoked sooner. Performed at [facility], [place] [address], [place], [place] [address]   MRSA PCR Screening     Status: None   Collection Time: 10/17/18  5:54 AM   Specimen: Nasal Mucosa; Nasopharyngeal  Result Value Ref Range Status   MRSA by PCR NEGATIVE NEGATIVE Final    Comment:        The GeneXpert MRSA Assay (FDA approved for NASAL specimens only), is one component of  a comprehensive MRSA colonization surveillance program. It is not intended to diagnose MRSA infection nor to guide or monitor treatment for MRSA infections. Performed at [facility], [place] [address] [place], [place] [address]      Labs: Basic Metabolic Panel: Recent Labs  Lab 10/16/18 1220 10/17/18 0502 10/18/18 0512  NA 139 138 140  K 5.5* 4.3 4.1  CL 104 104 104  CO2 22 25 26   GLUCOSE 332* 320* 168*  BUN 34* 30* 30*  CREATININE 1.75* 1.64* 1.51*  CALCIUM 9.1 8.6* 8.6*   Liver Function Tests: Recent Labs  Lab 10/18/18 0512  AST 35  ALT 27  ALKPHOS 77  BILITOT 0.5  PROT 6.0*  ALBUMIN 2.6*   No results for input(s): LIPASE, AMYLASE in the last 168 hours. No results for input(s): AMMONIA in the last 168 hours. CBC: Recent Labs  Lab 10/16/18 1220 10/17/18 0502 10/18/18 0512  WBC 11.4* 10.2 10.3  NEUTROABS 8.3*  --  6.4  HGB 13.8 11.2* 11.1*  HCT 42.7 35.9* 35.9*  MCV 95.7 98.4 98.1  PLT 169 180 160   Cardiac Enzymes: Recent Labs  Lab 10/16/18 1220 10/17/18 0502  CKTOTAL 1,706* 817*   BNP: BNP (last 3 results) No results for input(s): BNP in the last 8760 hours.  ProBNP (last 3 results)  No results for input(s): PROBNP in the last 8760 hours.  CBG: Recent Labs  Lab 10/17/18 0701 10/17/18 1119 10/17/18 1619 10/17/18 2059 10/18/18 0723  GLUCAP 282* 334* 257* 276* 124*       Signed:  Nita Sells MD   [facility] 10/18/2018, 8:13 AM

## 2018-10-18 NOTE — Progress Notes (Signed)
Marland Kitchen to be discharged home per MD order. Discussed prescriptions and follow up appointments with the patient. Prescriptions given to patient; medication list explained in detail. Patient verbalized understanding.  Skin clean, dry and intact without evidence of skin break down, no evidence of skin tears noted. IV catheter discontinued intact. Site without signs and symptoms of complications. Dressing and pressure applied. Pt denies pain at the site currently. No complaints noted.  Patient free of lines, drains, and wounds.   An After Visit Summary (AVS) was printed and given to the patient. Patient escorted via wheelchair, and discharged home via private auto.  Shela Commons, RN

## 2018-10-19 LAB — URINE CULTURE: Culture: >=100000 — AB

## 2018-10-20 DIAGNOSIS — M6281 Muscle weakness (generalized): Secondary | ICD-10-CM | POA: Diagnosis not present

## 2018-10-20 DIAGNOSIS — R262 Difficulty in walking, not elsewhere classified: Secondary | ICD-10-CM | POA: Diagnosis not present

## 2018-10-20 DIAGNOSIS — R278 Other lack of coordination: Secondary | ICD-10-CM | POA: Diagnosis not present

## 2018-10-20 DIAGNOSIS — Z9181 History of falling: Secondary | ICD-10-CM | POA: Diagnosis not present

## 2018-10-20 DIAGNOSIS — M25551 Pain in right hip: Secondary | ICD-10-CM | POA: Diagnosis not present

## 2018-10-20 DIAGNOSIS — N3941 Urge incontinence: Secondary | ICD-10-CM | POA: Diagnosis not present

## 2018-10-22 DIAGNOSIS — M25551 Pain in right hip: Secondary | ICD-10-CM | POA: Diagnosis not present

## 2018-10-22 DIAGNOSIS — N3941 Urge incontinence: Secondary | ICD-10-CM | POA: Diagnosis not present

## 2018-10-22 DIAGNOSIS — M6281 Muscle weakness (generalized): Secondary | ICD-10-CM | POA: Diagnosis not present

## 2018-10-22 DIAGNOSIS — R278 Other lack of coordination: Secondary | ICD-10-CM | POA: Diagnosis not present

## 2018-10-22 DIAGNOSIS — R262 Difficulty in walking, not elsewhere classified: Secondary | ICD-10-CM | POA: Diagnosis not present

## 2018-10-22 DIAGNOSIS — Z9181 History of falling: Secondary | ICD-10-CM | POA: Diagnosis not present

## 2018-10-25 DIAGNOSIS — M6281 Muscle weakness (generalized): Secondary | ICD-10-CM | POA: Diagnosis not present

## 2018-10-25 DIAGNOSIS — N3941 Urge incontinence: Secondary | ICD-10-CM | POA: Diagnosis not present

## 2018-10-25 DIAGNOSIS — R262 Difficulty in walking, not elsewhere classified: Secondary | ICD-10-CM | POA: Diagnosis not present

## 2018-10-25 DIAGNOSIS — M25551 Pain in right hip: Secondary | ICD-10-CM | POA: Diagnosis not present

## 2018-10-25 DIAGNOSIS — R278 Other lack of coordination: Secondary | ICD-10-CM | POA: Diagnosis not present

## 2018-10-25 DIAGNOSIS — Z9181 History of falling: Secondary | ICD-10-CM | POA: Diagnosis not present

## 2018-10-26 DIAGNOSIS — R262 Difficulty in walking, not elsewhere classified: Secondary | ICD-10-CM | POA: Diagnosis not present

## 2018-10-26 DIAGNOSIS — M25551 Pain in right hip: Secondary | ICD-10-CM | POA: Diagnosis not present

## 2018-10-26 DIAGNOSIS — Z9181 History of falling: Secondary | ICD-10-CM | POA: Diagnosis not present

## 2018-10-26 DIAGNOSIS — R278 Other lack of coordination: Secondary | ICD-10-CM | POA: Diagnosis not present

## 2018-10-26 DIAGNOSIS — M6281 Muscle weakness (generalized): Secondary | ICD-10-CM | POA: Diagnosis not present

## 2018-10-26 DIAGNOSIS — N3941 Urge incontinence: Secondary | ICD-10-CM | POA: Diagnosis not present

## 2018-10-28 DIAGNOSIS — R278 Other lack of coordination: Secondary | ICD-10-CM | POA: Diagnosis not present

## 2018-10-28 DIAGNOSIS — R262 Difficulty in walking, not elsewhere classified: Secondary | ICD-10-CM | POA: Diagnosis not present

## 2018-10-28 DIAGNOSIS — Z9181 History of falling: Secondary | ICD-10-CM | POA: Diagnosis not present

## 2018-10-28 DIAGNOSIS — M25551 Pain in right hip: Secondary | ICD-10-CM | POA: Diagnosis not present

## 2018-10-28 DIAGNOSIS — M6281 Muscle weakness (generalized): Secondary | ICD-10-CM | POA: Diagnosis not present

## 2018-10-28 DIAGNOSIS — N3941 Urge incontinence: Secondary | ICD-10-CM | POA: Diagnosis not present

## 2018-10-29 DIAGNOSIS — M25551 Pain in right hip: Secondary | ICD-10-CM | POA: Diagnosis not present

## 2018-10-29 DIAGNOSIS — N3941 Urge incontinence: Secondary | ICD-10-CM | POA: Diagnosis not present

## 2018-10-29 DIAGNOSIS — Z9181 History of falling: Secondary | ICD-10-CM | POA: Diagnosis not present

## 2018-10-29 DIAGNOSIS — R262 Difficulty in walking, not elsewhere classified: Secondary | ICD-10-CM | POA: Diagnosis not present

## 2018-10-29 DIAGNOSIS — M6281 Muscle weakness (generalized): Secondary | ICD-10-CM | POA: Diagnosis not present

## 2018-10-29 DIAGNOSIS — R278 Other lack of coordination: Secondary | ICD-10-CM | POA: Diagnosis not present

## 2018-11-02 DIAGNOSIS — N3941 Urge incontinence: Secondary | ICD-10-CM | POA: Diagnosis not present

## 2018-11-02 DIAGNOSIS — R262 Difficulty in walking, not elsewhere classified: Secondary | ICD-10-CM | POA: Diagnosis not present

## 2018-11-02 DIAGNOSIS — Z9181 History of falling: Secondary | ICD-10-CM | POA: Diagnosis not present

## 2018-11-02 DIAGNOSIS — M25551 Pain in right hip: Secondary | ICD-10-CM | POA: Diagnosis not present

## 2018-11-02 DIAGNOSIS — M6281 Muscle weakness (generalized): Secondary | ICD-10-CM | POA: Diagnosis not present

## 2018-11-02 DIAGNOSIS — R278 Other lack of coordination: Secondary | ICD-10-CM | POA: Diagnosis not present

## 2018-11-03 DIAGNOSIS — Z9181 History of falling: Secondary | ICD-10-CM | POA: Diagnosis not present

## 2018-11-03 DIAGNOSIS — M25551 Pain in right hip: Secondary | ICD-10-CM | POA: Diagnosis not present

## 2018-11-03 DIAGNOSIS — R278 Other lack of coordination: Secondary | ICD-10-CM | POA: Diagnosis not present

## 2018-11-03 DIAGNOSIS — R262 Difficulty in walking, not elsewhere classified: Secondary | ICD-10-CM | POA: Diagnosis not present

## 2018-11-03 DIAGNOSIS — N3941 Urge incontinence: Secondary | ICD-10-CM | POA: Diagnosis not present

## 2018-11-03 DIAGNOSIS — M6281 Muscle weakness (generalized): Secondary | ICD-10-CM | POA: Diagnosis not present

## 2018-11-09 DIAGNOSIS — R262 Difficulty in walking, not elsewhere classified: Secondary | ICD-10-CM | POA: Diagnosis not present

## 2018-11-09 DIAGNOSIS — M25551 Pain in right hip: Secondary | ICD-10-CM | POA: Diagnosis not present

## 2018-11-09 DIAGNOSIS — N3941 Urge incontinence: Secondary | ICD-10-CM | POA: Diagnosis not present

## 2018-11-09 DIAGNOSIS — M6281 Muscle weakness (generalized): Secondary | ICD-10-CM | POA: Diagnosis not present

## 2018-11-09 DIAGNOSIS — R278 Other lack of coordination: Secondary | ICD-10-CM | POA: Diagnosis not present

## 2018-11-09 DIAGNOSIS — Z9181 History of falling: Secondary | ICD-10-CM | POA: Diagnosis not present

## 2018-11-12 DIAGNOSIS — N3941 Urge incontinence: Secondary | ICD-10-CM | POA: Diagnosis not present

## 2018-11-12 DIAGNOSIS — R278 Other lack of coordination: Secondary | ICD-10-CM | POA: Diagnosis not present

## 2018-11-12 DIAGNOSIS — R262 Difficulty in walking, not elsewhere classified: Secondary | ICD-10-CM | POA: Diagnosis not present

## 2018-11-12 DIAGNOSIS — Z9181 History of falling: Secondary | ICD-10-CM | POA: Diagnosis not present

## 2018-11-12 DIAGNOSIS — E1165 Type 2 diabetes mellitus with hyperglycemia: Secondary | ICD-10-CM | POA: Diagnosis not present

## 2018-11-12 DIAGNOSIS — M25551 Pain in right hip: Secondary | ICD-10-CM | POA: Diagnosis not present

## 2018-11-12 DIAGNOSIS — M6281 Muscle weakness (generalized): Secondary | ICD-10-CM | POA: Diagnosis not present

## 2018-11-15 DIAGNOSIS — Z9181 History of falling: Secondary | ICD-10-CM | POA: Diagnosis not present

## 2018-11-15 DIAGNOSIS — R278 Other lack of coordination: Secondary | ICD-10-CM | POA: Diagnosis not present

## 2018-11-15 DIAGNOSIS — M25551 Pain in right hip: Secondary | ICD-10-CM | POA: Diagnosis not present

## 2018-11-15 DIAGNOSIS — R262 Difficulty in walking, not elsewhere classified: Secondary | ICD-10-CM | POA: Diagnosis not present

## 2018-11-15 DIAGNOSIS — M6281 Muscle weakness (generalized): Secondary | ICD-10-CM | POA: Diagnosis not present

## 2018-11-15 DIAGNOSIS — N3941 Urge incontinence: Secondary | ICD-10-CM | POA: Diagnosis not present

## 2018-11-16 DIAGNOSIS — Z9181 History of falling: Secondary | ICD-10-CM | POA: Diagnosis not present

## 2018-11-16 DIAGNOSIS — N3941 Urge incontinence: Secondary | ICD-10-CM | POA: Diagnosis not present

## 2018-11-16 DIAGNOSIS — M25551 Pain in right hip: Secondary | ICD-10-CM | POA: Diagnosis not present

## 2018-11-16 DIAGNOSIS — R278 Other lack of coordination: Secondary | ICD-10-CM | POA: Diagnosis not present

## 2018-11-16 DIAGNOSIS — R262 Difficulty in walking, not elsewhere classified: Secondary | ICD-10-CM | POA: Diagnosis not present

## 2018-11-16 DIAGNOSIS — M6281 Muscle weakness (generalized): Secondary | ICD-10-CM | POA: Diagnosis not present

## 2018-11-17 DIAGNOSIS — Z9181 History of falling: Secondary | ICD-10-CM | POA: Diagnosis not present

## 2018-11-17 DIAGNOSIS — R278 Other lack of coordination: Secondary | ICD-10-CM | POA: Diagnosis not present

## 2018-11-17 DIAGNOSIS — N3941 Urge incontinence: Secondary | ICD-10-CM | POA: Diagnosis not present

## 2018-11-17 DIAGNOSIS — M6281 Muscle weakness (generalized): Secondary | ICD-10-CM | POA: Diagnosis not present

## 2018-11-17 DIAGNOSIS — R262 Difficulty in walking, not elsewhere classified: Secondary | ICD-10-CM | POA: Diagnosis not present

## 2018-11-17 DIAGNOSIS — M25551 Pain in right hip: Secondary | ICD-10-CM | POA: Diagnosis not present

## 2018-11-18 DIAGNOSIS — N3941 Urge incontinence: Secondary | ICD-10-CM | POA: Diagnosis not present

## 2018-11-18 DIAGNOSIS — Z9181 History of falling: Secondary | ICD-10-CM | POA: Diagnosis not present

## 2018-11-18 DIAGNOSIS — R262 Difficulty in walking, not elsewhere classified: Secondary | ICD-10-CM | POA: Diagnosis not present

## 2018-11-18 DIAGNOSIS — R278 Other lack of coordination: Secondary | ICD-10-CM | POA: Diagnosis not present

## 2018-11-18 DIAGNOSIS — M25551 Pain in right hip: Secondary | ICD-10-CM | POA: Diagnosis not present

## 2018-11-18 DIAGNOSIS — M6281 Muscle weakness (generalized): Secondary | ICD-10-CM | POA: Diagnosis not present

## 2018-11-19 DIAGNOSIS — N3941 Urge incontinence: Secondary | ICD-10-CM | POA: Diagnosis not present

## 2018-11-19 DIAGNOSIS — M6281 Muscle weakness (generalized): Secondary | ICD-10-CM | POA: Diagnosis not present

## 2018-11-19 DIAGNOSIS — R262 Difficulty in walking, not elsewhere classified: Secondary | ICD-10-CM | POA: Diagnosis not present

## 2018-11-19 DIAGNOSIS — R278 Other lack of coordination: Secondary | ICD-10-CM | POA: Diagnosis not present

## 2018-11-19 DIAGNOSIS — M25551 Pain in right hip: Secondary | ICD-10-CM | POA: Diagnosis not present

## 2018-11-19 DIAGNOSIS — Z9181 History of falling: Secondary | ICD-10-CM | POA: Diagnosis not present

## 2018-11-22 DIAGNOSIS — E1129 Type 2 diabetes mellitus with other diabetic kidney complication: Secondary | ICD-10-CM | POA: Diagnosis not present

## 2018-11-23 DIAGNOSIS — M25551 Pain in right hip: Secondary | ICD-10-CM | POA: Diagnosis not present

## 2018-11-23 DIAGNOSIS — R278 Other lack of coordination: Secondary | ICD-10-CM | POA: Diagnosis not present

## 2018-11-23 DIAGNOSIS — Z9181 History of falling: Secondary | ICD-10-CM | POA: Diagnosis not present

## 2018-11-23 DIAGNOSIS — R262 Difficulty in walking, not elsewhere classified: Secondary | ICD-10-CM | POA: Diagnosis not present

## 2018-11-23 DIAGNOSIS — M6281 Muscle weakness (generalized): Secondary | ICD-10-CM | POA: Diagnosis not present

## 2018-11-23 DIAGNOSIS — N3941 Urge incontinence: Secondary | ICD-10-CM | POA: Diagnosis not present

## 2018-11-24 DIAGNOSIS — M6281 Muscle weakness (generalized): Secondary | ICD-10-CM | POA: Diagnosis not present

## 2018-11-24 DIAGNOSIS — Z9181 History of falling: Secondary | ICD-10-CM | POA: Diagnosis not present

## 2018-11-24 DIAGNOSIS — R262 Difficulty in walking, not elsewhere classified: Secondary | ICD-10-CM | POA: Diagnosis not present

## 2018-11-24 DIAGNOSIS — M25551 Pain in right hip: Secondary | ICD-10-CM | POA: Diagnosis not present

## 2018-11-24 DIAGNOSIS — R278 Other lack of coordination: Secondary | ICD-10-CM | POA: Diagnosis not present

## 2018-11-24 DIAGNOSIS — N3941 Urge incontinence: Secondary | ICD-10-CM | POA: Diagnosis not present

## 2018-11-26 DIAGNOSIS — M25551 Pain in right hip: Secondary | ICD-10-CM | POA: Diagnosis not present

## 2018-11-26 DIAGNOSIS — M6281 Muscle weakness (generalized): Secondary | ICD-10-CM | POA: Diagnosis not present

## 2018-11-26 DIAGNOSIS — R262 Difficulty in walking, not elsewhere classified: Secondary | ICD-10-CM | POA: Diagnosis not present

## 2018-11-26 DIAGNOSIS — Z9181 History of falling: Secondary | ICD-10-CM | POA: Diagnosis not present

## 2018-11-26 DIAGNOSIS — R278 Other lack of coordination: Secondary | ICD-10-CM | POA: Diagnosis not present

## 2018-11-26 DIAGNOSIS — N3941 Urge incontinence: Secondary | ICD-10-CM | POA: Diagnosis not present

## 2018-12-01 DIAGNOSIS — R278 Other lack of coordination: Secondary | ICD-10-CM | POA: Diagnosis not present

## 2018-12-01 DIAGNOSIS — N3941 Urge incontinence: Secondary | ICD-10-CM | POA: Diagnosis not present

## 2018-12-01 DIAGNOSIS — M6281 Muscle weakness (generalized): Secondary | ICD-10-CM | POA: Diagnosis not present

## 2018-12-01 DIAGNOSIS — Z9181 History of falling: Secondary | ICD-10-CM | POA: Diagnosis not present

## 2018-12-01 DIAGNOSIS — R262 Difficulty in walking, not elsewhere classified: Secondary | ICD-10-CM | POA: Diagnosis not present

## 2018-12-01 DIAGNOSIS — M25551 Pain in right hip: Secondary | ICD-10-CM | POA: Diagnosis not present

## 2018-12-06 DIAGNOSIS — Z119 Encounter for screening for infectious and parasitic diseases, unspecified: Secondary | ICD-10-CM | POA: Diagnosis not present

## 2018-12-07 DIAGNOSIS — R262 Difficulty in walking, not elsewhere classified: Secondary | ICD-10-CM | POA: Diagnosis not present

## 2018-12-07 DIAGNOSIS — M25551 Pain in right hip: Secondary | ICD-10-CM | POA: Diagnosis not present

## 2018-12-07 DIAGNOSIS — N3941 Urge incontinence: Secondary | ICD-10-CM | POA: Diagnosis not present

## 2018-12-07 DIAGNOSIS — Z9181 History of falling: Secondary | ICD-10-CM | POA: Diagnosis not present

## 2018-12-07 DIAGNOSIS — M6281 Muscle weakness (generalized): Secondary | ICD-10-CM | POA: Diagnosis not present

## 2018-12-07 DIAGNOSIS — R278 Other lack of coordination: Secondary | ICD-10-CM | POA: Diagnosis not present

## 2018-12-12 ENCOUNTER — Other Ambulatory Visit: Payer: Self-pay | Admitting: Internal Medicine

## 2018-12-13 DIAGNOSIS — E1165 Type 2 diabetes mellitus with hyperglycemia: Secondary | ICD-10-CM | POA: Diagnosis not present

## 2018-12-14 DIAGNOSIS — M6281 Muscle weakness (generalized): Secondary | ICD-10-CM | POA: Diagnosis not present

## 2018-12-14 DIAGNOSIS — R262 Difficulty in walking, not elsewhere classified: Secondary | ICD-10-CM | POA: Diagnosis not present

## 2018-12-14 DIAGNOSIS — Z9181 History of falling: Secondary | ICD-10-CM | POA: Diagnosis not present

## 2018-12-14 DIAGNOSIS — R278 Other lack of coordination: Secondary | ICD-10-CM | POA: Diagnosis not present

## 2018-12-14 DIAGNOSIS — N3941 Urge incontinence: Secondary | ICD-10-CM | POA: Diagnosis not present

## 2018-12-14 DIAGNOSIS — M25551 Pain in right hip: Secondary | ICD-10-CM | POA: Diagnosis not present

## 2018-12-14 DIAGNOSIS — R2681 Unsteadiness on feet: Secondary | ICD-10-CM | POA: Diagnosis not present

## 2018-12-15 DIAGNOSIS — R262 Difficulty in walking, not elsewhere classified: Secondary | ICD-10-CM | POA: Diagnosis not present

## 2018-12-15 DIAGNOSIS — R2681 Unsteadiness on feet: Secondary | ICD-10-CM | POA: Diagnosis not present

## 2018-12-15 DIAGNOSIS — M6281 Muscle weakness (generalized): Secondary | ICD-10-CM | POA: Diagnosis not present

## 2018-12-15 DIAGNOSIS — R278 Other lack of coordination: Secondary | ICD-10-CM | POA: Diagnosis not present

## 2018-12-15 DIAGNOSIS — N3941 Urge incontinence: Secondary | ICD-10-CM | POA: Diagnosis not present

## 2018-12-15 DIAGNOSIS — Z9181 History of falling: Secondary | ICD-10-CM | POA: Diagnosis not present

## 2018-12-15 DIAGNOSIS — M25551 Pain in right hip: Secondary | ICD-10-CM | POA: Diagnosis not present

## 2018-12-16 DIAGNOSIS — R2681 Unsteadiness on feet: Secondary | ICD-10-CM | POA: Diagnosis not present

## 2018-12-16 DIAGNOSIS — Z9181 History of falling: Secondary | ICD-10-CM | POA: Diagnosis not present

## 2018-12-16 DIAGNOSIS — R262 Difficulty in walking, not elsewhere classified: Secondary | ICD-10-CM | POA: Diagnosis not present

## 2018-12-16 DIAGNOSIS — R278 Other lack of coordination: Secondary | ICD-10-CM | POA: Diagnosis not present

## 2018-12-16 DIAGNOSIS — M25551 Pain in right hip: Secondary | ICD-10-CM | POA: Diagnosis not present

## 2018-12-16 DIAGNOSIS — M6281 Muscle weakness (generalized): Secondary | ICD-10-CM | POA: Diagnosis not present

## 2018-12-16 DIAGNOSIS — N3941 Urge incontinence: Secondary | ICD-10-CM | POA: Diagnosis not present

## 2018-12-17 ENCOUNTER — Other Ambulatory Visit: Payer: Self-pay

## 2018-12-17 ENCOUNTER — Emergency Department (HOSPITAL_COMMUNITY): Payer: Medicare HMO

## 2018-12-17 ENCOUNTER — Inpatient Hospital Stay (HOSPITAL_COMMUNITY)
Admission: EM | Admit: 2018-12-17 | Discharge: 2018-12-21 | DRG: 071 | Disposition: A | Discharge status: Home Health | Payer: Medicare HMO | Attending: Internal Medicine | Admitting: Internal Medicine

## 2018-12-17 ENCOUNTER — Encounter (HOSPITAL_COMMUNITY): Payer: Self-pay

## 2018-12-17 DIAGNOSIS — F329 Major depressive disorder, single episode, unspecified: Secondary | ICD-10-CM | POA: Diagnosis present

## 2018-12-17 DIAGNOSIS — Z6837 Body mass index (BMI) 37.0-37.9, adult: Secondary | ICD-10-CM

## 2018-12-17 DIAGNOSIS — R6510 Systemic inflammatory response syndrome (SIRS) of non-infectious origin without acute organ dysfunction: Secondary | ICD-10-CM | POA: Diagnosis present

## 2018-12-17 DIAGNOSIS — I130 Hypertensive heart and chronic kidney disease with heart failure and stage 1 through stage 4 chronic kidney disease, or unspecified chronic kidney disease: Secondary | ICD-10-CM | POA: Diagnosis present

## 2018-12-17 DIAGNOSIS — Z66 Do not resuscitate: Secondary | ICD-10-CM | POA: Diagnosis present

## 2018-12-17 DIAGNOSIS — M069 Rheumatoid arthritis, unspecified: Secondary | ICD-10-CM | POA: Diagnosis present

## 2018-12-17 DIAGNOSIS — I10 Essential (primary) hypertension: Secondary | ICD-10-CM

## 2018-12-17 DIAGNOSIS — Y92129 Unspecified place in nursing home as the place of occurrence of the external cause: Secondary | ICD-10-CM | POA: Diagnosis not present

## 2018-12-17 DIAGNOSIS — M109 Gout, unspecified: Secondary | ICD-10-CM | POA: Diagnosis present

## 2018-12-17 DIAGNOSIS — E1122 Type 2 diabetes mellitus with diabetic chronic kidney disease: Secondary | ICD-10-CM | POA: Diagnosis present

## 2018-12-17 DIAGNOSIS — W1830XA Fall on same level, unspecified, initial encounter: Secondary | ICD-10-CM | POA: Diagnosis present

## 2018-12-17 DIAGNOSIS — Z7982 Long term (current) use of aspirin: Secondary | ICD-10-CM

## 2018-12-17 DIAGNOSIS — Z96643 Presence of artificial hip joint, bilateral: Secondary | ICD-10-CM | POA: Diagnosis present

## 2018-12-17 DIAGNOSIS — Z823 Family history of stroke: Secondary | ICD-10-CM

## 2018-12-17 DIAGNOSIS — R531 Weakness: Secondary | ICD-10-CM

## 2018-12-17 DIAGNOSIS — M6282 Rhabdomyolysis: Secondary | ICD-10-CM | POA: Diagnosis present

## 2018-12-17 DIAGNOSIS — T796XXA Traumatic ischemia of muscle, initial encounter: Secondary | ICD-10-CM

## 2018-12-17 DIAGNOSIS — Z20828 Contact with and (suspected) exposure to other viral communicable diseases: Secondary | ICD-10-CM | POA: Diagnosis present

## 2018-12-17 DIAGNOSIS — R748 Abnormal levels of other serum enzymes: Secondary | ICD-10-CM

## 2018-12-17 DIAGNOSIS — G4733 Obstructive sleep apnea (adult) (pediatric): Secondary | ICD-10-CM | POA: Diagnosis not present

## 2018-12-17 DIAGNOSIS — E0822 Diabetes mellitus due to underlying condition with diabetic chronic kidney disease: Secondary | ICD-10-CM

## 2018-12-17 DIAGNOSIS — Z87440 Personal history of urinary (tract) infections: Secondary | ICD-10-CM

## 2018-12-17 DIAGNOSIS — Z794 Long term (current) use of insulin: Secondary | ICD-10-CM

## 2018-12-17 DIAGNOSIS — W19XXXA Unspecified fall, initial encounter: Secondary | ICD-10-CM | POA: Diagnosis not present

## 2018-12-17 DIAGNOSIS — E860 Dehydration: Secondary | ICD-10-CM | POA: Diagnosis present

## 2018-12-17 DIAGNOSIS — Z79899 Other long term (current) drug therapy: Secondary | ICD-10-CM

## 2018-12-17 DIAGNOSIS — N390 Urinary tract infection, site not specified: Secondary | ICD-10-CM

## 2018-12-17 DIAGNOSIS — I5032 Chronic diastolic (congestive) heart failure: Secondary | ICD-10-CM | POA: Diagnosis not present

## 2018-12-17 DIAGNOSIS — Z9049 Acquired absence of other specified parts of digestive tract: Secondary | ICD-10-CM

## 2018-12-17 DIAGNOSIS — L89152 Pressure ulcer of sacral region, stage 2: Secondary | ICD-10-CM | POA: Diagnosis present

## 2018-12-17 DIAGNOSIS — D72829 Elevated white blood cell count, unspecified: Secondary | ICD-10-CM | POA: Diagnosis present

## 2018-12-17 DIAGNOSIS — R404 Transient alteration of awareness: Secondary | ICD-10-CM | POA: Diagnosis not present

## 2018-12-17 DIAGNOSIS — L8990 Pressure ulcer of unspecified site, unspecified stage: Secondary | ICD-10-CM | POA: Insufficient documentation

## 2018-12-17 DIAGNOSIS — G9341 Metabolic encephalopathy: Secondary | ICD-10-CM | POA: Diagnosis not present

## 2018-12-17 DIAGNOSIS — R4182 Altered mental status, unspecified: Secondary | ICD-10-CM | POA: Diagnosis not present

## 2018-12-17 DIAGNOSIS — Z5320 Procedure and treatment not carried out because of patient's decision for unspecified reasons: Secondary | ICD-10-CM | POA: Diagnosis not present

## 2018-12-17 DIAGNOSIS — G9340 Encephalopathy, unspecified: Secondary | ICD-10-CM | POA: Diagnosis present

## 2018-12-17 DIAGNOSIS — Z03818 Encounter for observation for suspected exposure to other biological agents ruled out: Secondary | ICD-10-CM | POA: Diagnosis not present

## 2018-12-17 DIAGNOSIS — N183 Chronic kidney disease, stage 3 (moderate): Secondary | ICD-10-CM

## 2018-12-17 DIAGNOSIS — Z96652 Presence of left artificial knee joint: Secondary | ICD-10-CM | POA: Diagnosis present

## 2018-12-17 DIAGNOSIS — E785 Hyperlipidemia, unspecified: Secondary | ICD-10-CM | POA: Diagnosis present

## 2018-12-17 DIAGNOSIS — Y92099 Unspecified place in other non-institutional residence as the place of occurrence of the external cause: Secondary | ICD-10-CM

## 2018-12-17 DIAGNOSIS — I2510 Atherosclerotic heart disease of native coronary artery without angina pectoris: Secondary | ICD-10-CM | POA: Diagnosis present

## 2018-12-17 LAB — CBC
HCT: 37.8 % (ref 36.0–46.0)
Hemoglobin: 11.5 g/dL — ABNORMAL LOW (ref 12.0–15.0)
MCH: 29.9 pg (ref 26.0–34.0)
MCHC: 30.4 g/dL (ref 30.0–36.0)
MCV: 98.4 fL (ref 80.0–100.0)
Platelets: 245 10*3/uL (ref 150–400)
RBC: 3.84 MIL/uL — ABNORMAL LOW (ref 3.87–5.11)
RDW: 14.6 % (ref 11.5–15.5)
WBC: 10.6 10*3/uL — ABNORMAL HIGH (ref 4.0–10.5)
nRBC: 0 % (ref 0.0–0.2)

## 2018-12-17 LAB — URINALYSIS, ROUTINE W REFLEX MICROSCOPIC
Bilirubin Urine: NEGATIVE
Glucose, UA: NEGATIVE mg/dL
Ketones, ur: NEGATIVE mg/dL
Nitrite: NEGATIVE
Protein, ur: NEGATIVE mg/dL
Specific Gravity, Urine: 1.013 (ref 1.005–1.030)
pH: 6 (ref 5.0–8.0)

## 2018-12-17 LAB — GLUCOSE, CAPILLARY
Glucose-Capillary: 85 mg/dL (ref 70–99)
Glucose-Capillary: 253 mg/dL — ABNORMAL HIGH (ref 70–99)

## 2018-12-17 LAB — CBC WITH DIFFERENTIAL/PLATELET
Abs Immature Granulocytes: 0.16 10*3/uL — ABNORMAL HIGH (ref 0.00–0.07)
Basophils Absolute: 0 10*3/uL (ref 0.0–0.1)
Basophils Relative: 0 %
Eosinophils Absolute: 0.4 10*3/uL (ref 0.0–0.5)
Eosinophils Relative: 3 %
HCT: 43.3 % (ref 36.0–46.0)
Hemoglobin: 13.1 g/dL (ref 12.0–15.0)
Immature Granulocytes: 1 %
Lymphocytes Relative: 23 %
Lymphs Abs: 2.9 10*3/uL (ref 0.7–4.0)
MCH: 29.7 pg (ref 26.0–34.0)
MCHC: 30.3 g/dL (ref 30.0–36.0)
MCV: 98.2 fL (ref 80.0–100.0)
Monocytes Absolute: 1.1 10*3/uL — ABNORMAL HIGH (ref 0.1–1.0)
Monocytes Relative: 9 %
Neutro Abs: 7.8 10*3/uL — ABNORMAL HIGH (ref 1.7–7.7)
Neutrophils Relative %: 64 %
Platelets: 268 10*3/uL (ref 150–400)
RBC: 4.41 MIL/uL (ref 3.87–5.11)
RDW: 14.6 % (ref 11.5–15.5)
WBC: 12.3 10*3/uL — ABNORMAL HIGH (ref 4.0–10.5)
nRBC: 0 % (ref 0.0–0.2)

## 2018-12-17 LAB — TSH: TSH: 1.071 u[IU]/mL (ref 0.350–4.500)

## 2018-12-17 LAB — CREATININE, SERUM
Creatinine, Ser: 1.34 mg/dL — ABNORMAL HIGH (ref 0.44–1.00)
GFR calc Af Amer: 41 mL/min — ABNORMAL LOW (ref >60)
GFR calc non Af Amer: 36 mL/min — ABNORMAL LOW (ref >60)

## 2018-12-17 LAB — COMPREHENSIVE METABOLIC PANEL
ALT: 45 U/L — ABNORMAL HIGH (ref 0–44)
AST: 54 U/L — ABNORMAL HIGH (ref 15–41)
Albumin: 3.6 g/dL (ref 3.5–5.0)
Alkaline Phosphatase: 130 U/L — ABNORMAL HIGH (ref 38–126)
Anion gap: 12 (ref 5–15)
BUN: 41 mg/dL — ABNORMAL HIGH (ref 8–23)
CO2: 27 mmol/L (ref 22–32)
Calcium: 9 mg/dL (ref 8.9–10.3)
Chloride: 97 mmol/L — ABNORMAL LOW (ref 98–111)
Creatinine, Ser: 1.55 mg/dL — ABNORMAL HIGH (ref 0.44–1.00)
GFR calc Af Amer: 35 mL/min — ABNORMAL LOW (ref >60)
GFR calc non Af Amer: 30 mL/min — ABNORMAL LOW (ref >60)
Glucose, Bld: 78 mg/dL (ref 70–99)
Potassium: 4 mmol/L (ref 3.5–5.1)
Sodium: 136 mmol/L (ref 135–145)
Total Bilirubin: 0.5 mg/dL (ref 0.3–1.2)
Total Protein: 8.4 g/dL — ABNORMAL HIGH (ref 6.5–8.1)

## 2018-12-17 LAB — HEMOGLOBIN A1C
Hgb A1c MFr Bld: 10.6 % — ABNORMAL HIGH (ref 4.8–5.6)
Mean Plasma Glucose: 257.52 mg/dL

## 2018-12-17 LAB — SARS CORONAVIRUS 2 BY RT PCR (HOSPITAL ORDER, PERFORMED IN ~~LOC~~ HOSPITAL LAB): SARS Coronavirus 2: NEGATIVE

## 2018-12-17 LAB — CK: Total CK: 347 U/L — ABNORMAL HIGH (ref 38–234)

## 2018-12-17 MED ORDER — INSULIN GLARGINE 100 UNIT/ML ~~LOC~~ SOLN
15.0000 [IU] | Freq: Every day | SUBCUTANEOUS | Status: DC
  Administered 2018-12-17: 15 [IU] via SUBCUTANEOUS
  Filled 2018-12-17 (×2): qty 0.15

## 2018-12-17 MED ORDER — TRAZODONE HCL 50 MG PO TABS: 25.0000 mg | ORAL_TABLET | Freq: Every evening | ORAL | Status: DC | PRN

## 2018-12-17 MED ORDER — ACETAMINOPHEN 325 MG PO TABS: 650.0000 mg | ORAL_TABLET | Freq: Four times a day (QID) | ORAL | Status: DC | PRN

## 2018-12-17 MED ORDER — ONDANSETRON HCL 4 MG/2ML IJ SOLN: 4.0000 mg | Freq: Four times a day (QID) | INTRAMUSCULAR | Status: DC | PRN

## 2018-12-17 MED ORDER — SENNOSIDES-DOCUSATE SODIUM 8.6-50 MG PO TABS: 1.0000 | ORAL_TABLET | Freq: Every evening | ORAL | Status: DC | PRN

## 2018-12-17 MED ORDER — SODIUM CHLORIDE 0.9 % IV SOLN
INTRAVENOUS | Status: AC
  Administered 2018-12-17 – 2018-12-18 (×2): via INTRAVENOUS

## 2018-12-17 MED ORDER — ENOXAPARIN SODIUM 30 MG/0.3ML ~~LOC~~ SOLN
30.0000 mg | SUBCUTANEOUS | Status: DC
  Administered 2018-12-17 – 2018-12-18 (×2): 30 mg via SUBCUTANEOUS
  Filled 2018-12-17 (×2): qty 0.3

## 2018-12-17 MED ORDER — SODIUM CHLORIDE 0.9 % IV BOLUS
500.0000 mL | Freq: Once | INTRAVENOUS | Status: AC
  Administered 2018-12-17: 500 mL via INTRAVENOUS

## 2018-12-17 MED ORDER — ACETAMINOPHEN 650 MG RE SUPP: 650.0000 mg | Freq: Four times a day (QID) | RECTAL | Status: DC | PRN

## 2018-12-17 MED ORDER — OXYCODONE HCL 5 MG PO TABS
5.0000 mg | ORAL_TABLET | ORAL | Status: DC | PRN
  Administered 2018-12-20: 5 mg via ORAL
  Filled 2018-12-17: qty 1

## 2018-12-17 MED ORDER — INSULIN ASPART 100 UNIT/ML ~~LOC~~ SOLN
0.0000 [IU] | Freq: Every day | SUBCUTANEOUS | Status: DC
  Administered 2018-12-17 – 2018-12-18 (×2): 3 [IU] via SUBCUTANEOUS
  Administered 2018-12-19: 2 [IU] via SUBCUTANEOUS

## 2018-12-17 MED ORDER — SODIUM CHLORIDE 0.9 % IV SOLN
1.0000 g | Freq: Once | INTRAVENOUS | Status: AC
  Administered 2018-12-17: 1 g via INTRAVENOUS
  Filled 2018-12-17: qty 10

## 2018-12-17 MED ORDER — DOCUSATE SODIUM 100 MG PO CAPS
100.0000 mg | ORAL_CAPSULE | Freq: Two times a day (BID) | ORAL | Status: DC
  Administered 2018-12-17 – 2018-12-21 (×7): 100 mg via ORAL
  Filled 2018-12-17 (×8): qty 1

## 2018-12-17 MED ORDER — ONDANSETRON HCL 4 MG PO TABS: 4.0000 mg | ORAL_TABLET | Freq: Four times a day (QID) | ORAL | Status: DC | PRN

## 2018-12-17 MED ORDER — INSULIN ASPART 100 UNIT/ML ~~LOC~~ SOLN
0.0000 [IU] | Freq: Three times a day (TID) | SUBCUTANEOUS | Status: DC
  Administered 2018-12-18: 7 [IU] via SUBCUTANEOUS
  Administered 2018-12-18: 3 [IU] via SUBCUTANEOUS
  Administered 2018-12-18: 09:00:00 2 [IU] via SUBCUTANEOUS
  Administered 2018-12-19: 3 [IU] via SUBCUTANEOUS
  Administered 2018-12-19: 15:00:00 5 [IU] via SUBCUTANEOUS
  Administered 2018-12-19 – 2018-12-20 (×2): 2 [IU] via SUBCUTANEOUS
  Administered 2018-12-20: 3 [IU] via SUBCUTANEOUS
  Administered 2018-12-20 – 2018-12-21 (×2): 2 [IU] via SUBCUTANEOUS

## 2018-12-17 MED ORDER — FLEET ENEMA 7-19 GM/118ML RE ENEM: 1.0000 | ENEMA | Freq: Once | RECTAL | Status: DC | PRN

## 2018-12-17 MED ORDER — BISACODYL 5 MG PO TBEC: 5.0000 mg | DELAYED_RELEASE_TABLET | Freq: Every day | ORAL | Status: DC | PRN

## 2018-12-17 NOTE — ED Notes (Signed)
Patient transported to CT 

## 2018-12-17 NOTE — ED Provider Notes (Signed)
[facility] DEPT Provider Note   CSN: [id number] Arrival date & time: 12/17/18  0825     History   Chief Complaint Chief Complaint  Patient presents with   Altered Mental Status    HPI KARLI WESTBERRY is a 85 y.o. female.     HPI Patient presents from independent living facility by EMS for altered mental status.  Staff found patient sitting in an odd position on her recliner this morning.  She was confused over her baseline.  Patient states that she had fallen but on the ground but this was not witnessed by staff.  Patient is currently denying any pain or discomfort.  Recently was treated for UTI.  Patient is a limited historian due to her altered mental status.  Level 5 caveat applies. Past Medical History:  Diagnosis Date   Arthritis    "hips before they were replaced, right knee now" (11/09/2015)   Chronic diastolic (congestive) heart failure ([place])    a. echo 06/2015: EF 65-70%, Grade 1 DD, mitral valve calcification.   CKD (chronic kidney disease), stage III (HCC)    Depression    Dyspnea    H/O cardiac catheterization    a. h/o cardiac catheterization 05/2013 in [place] showing only minimal CAD with increased filling pressure.   Hyperlipemia    Hypertensive heart disease    Morbid obesity ([place])    Palsy, Bell's    RA (rheumatoid arthritis) ([place])    Sleep apnea    "suppose to wear mask; couldn't" (11/09/2015)   Type II diabetes mellitus [facility])     Patient Active Problem List   Diagnosis Date Noted   Syncope 10/17/2018   Fall 10/16/2018   Gout 02/19/2016   H/O cardiac catheterization    Hypertensive heart disease with CHF (congestive heart failure) (HCC)    Hypoglycemia due to insulin    CKD (chronic kidney disease), stage III (HCC)    Chronic diastolic CHF (congestive heart failure) ([place])    Morbid obesity ([place])    Hypertension    Hyperlipemia    Sleep apnea    Palsy, Bell's    Diabetes mellitus type 2,  insulin dependent ([place]) 08/10/2014   Distal radius fracture 08/08/2014    Past Surgical History:  Procedure Laterality Date   APPENDECTOMY  1966   CARDIAC CATHETERIZATION  05/2013   CARPAL TUNNEL RELEASE Right 08/08/2014   Procedure: OPEN CARPAL TUNNEL RELEASE;  Surgeon: Roseanne Kaufman, MD;  Location: [place];  Service: Orthopedics;  Laterality: Right;   CATARACT EXTRACTION W/ INTRAOCULAR LENS  IMPLANT, BILATERAL Bilateral    CHOLECYSTECTOMY OPEN  1966   COLONOSCOPY     FRACTURE SURGERY     JOINT REPLACEMENT     KNEE ARTHROPLASTY Left    ORIF WRIST FRACTURE Right 08/08/2014   Procedure: Right OPEN REDUCTION INTERNAL FIXATION (ORIF) WRIST FRACTURE WITH REPAIR AND RECONST/ALLOGRAFT AND BONE GRAFT;  Surgeon: Roseanne Kaufman, MD;  Location: [facility];  Service: Orthopedics;  Laterality: Right;   TOTAL HIP ARTHROPLASTY Bilateral      OB History   No obstetric history on file.      Home Medications    Prior to Admission medications   Medication Sig Start Date End Date Taking? Authorizing Provider  ACCU-CHEK AVIVA PLUS test strip TEST BS QID 12/24/17   [provider]  aspirin EC 81 MG tablet Take 81 mg by mouth daily.    [provider]  calcium carbonate (TUMS EX) 750 MG chewable tablet Chew  1 tablet by mouth daily.    [provider]  carvedilol (COREG CR) 80 MG 24 hr capsule TAKE 1 CAPSULE BY MOUTH  DAILY Patient taking differently: Take 80 mg by mouth daily.  09/01/18   Richardson Dopp T, PA-C  chlorhexidine (PERIDEX) 0.12 % solution Use as directed 5 mLs in the mouth or throat daily. Swish and spit 01/17/15   [provider]  cholecalciferol (VITAMIN D) 1000 units tablet Take 3,000 Units by mouth daily.    [provider]  ezetimibe-simvastatin (VYTORIN) 10-40 MG tablet TAKE 1 TABLET BY MOUTH  DAILY Patient taking differently: Take 1 tablet by mouth at bedtime.  09/01/18   Richardson Dopp T, PA-C  insulin aspart (NOVOLOG FLEXPEN) 100  UNIT/ML FlexPen Inject 15 Units into the skin See admin instructions. Inject 15 units subcutaneously two to three times daily with meals    [provider]  Insulin Detemir (LEVEMIR FLEXTOUCH) 100 UNIT/ML Pen Inject 60 Units into the skin at bedtime.     [provider]  magnesium oxide (MAG-OX) 400 MG tablet Take 1 tablet (400 mg total) by mouth daily. 07/07/16   Fay Records, MD  potassium chloride SA (K-DUR,KLOR-CON) 20 MEQ tablet TAKE 1 TABLET(20 MEQ) BY MOUTH DAILY Patient taking differently: Take 20 mEq by mouth daily.  04/06/17   Richardson Dopp T, PA-C  torsemide (DEMADEX) 20 MG tablet Take 2 tablets (40 mg total) by mouth daily. Please make yearly appt with Dr. Harrington Challenger for December for future refills. 1st attempt 12/14/18   Fay Records, MD  Vitamin D, Ergocalciferol, (DRISDOL) 50000 UNITS CAPS capsule Take 50,000 Units by mouth every Wednesday.     [provider]    Family History Family History  Problem Relation Age of Onset   Sudden death Mother    Stroke Mother    Hypertension Neg Hx     Social History Social History   Tobacco Use   Smoking status: Never Smoker   Smokeless tobacco: Never Used  Substance Use Topics   Alcohol use: Yes    Alcohol/week: 0.0 standard drinks    Comment: 11/09/2015 "might have glass of champaign on special occasion"   Drug use: No     Allergies   Patient has no known allergies.   Review of Systems Review of Systems  Unable to perform ROS: Mental status change     Physical Exam Updated Vital Signs BP (!) 134/54    Pulse 88    Temp 98.1 F (36.7 C) (Oral)    Resp 17    SpO2 92%   Physical Exam Vitals signs and nursing note reviewed.  Constitutional:      Appearance: Normal appearance. She is well-developed.  HENT:     Head: Normocephalic and atraumatic.     Comments: No obvious head trauma.  Mucous membranes are dry.  No facial asymmetry.    Nose: Nose normal.     Mouth/Throat:     Mouth: Mucous  membranes are dry.  Eyes:     Extraocular Movements: Extraocular movements intact.     Pupils: Pupils are equal, round, and reactive to light.  Neck:     Musculoskeletal: Normal range of motion and neck supple. No neck rigidity or muscular tenderness.     Comments: No meningismus.  No posterior midline cervical tenderness to palpation. Cardiovascular:     Rate and Rhythm: Normal rate and regular rhythm.     Heart sounds: No murmur. No friction rub. No gallop.  Pulmonary:     Effort: Pulmonary effort is normal. No respiratory distress.     Breath sounds: Normal breath sounds. No stridor. No wheezing, rhonchi or rales.  Chest:     Chest wall: No tenderness.  Abdominal:     General: Bowel sounds are normal.     Palpations: Abdomen is soft.     Tenderness: There is no abdominal tenderness. There is no guarding or rebound.  Musculoskeletal: Normal range of motion.        General: No swelling, tenderness, deformity or signs of injury.     Right lower leg: No edema.     Left lower leg: No edema.     Comments: Pelvis is stable with no discomfort with range of motion of bilateral hips.  Distal pulses intact.  Lymphadenopathy:     Cervical: No cervical adenopathy.  Skin:    General: Skin is warm and dry.     Findings: Erythema present. No rash.     Comments: Patient does have bilateral lower extremity erythema extending from the ankles to the mid tibial area.  There is some warmth associated with this.  No obvious injuries.  Neurological:     Mental Status: She is alert.     Comments: Oriented to place and self.  Moving all extremities without focal deficit.  Sensation appears to be intact.  Psychiatric:        Behavior: Behavior normal.      ED Treatments / Results  Labs (all labs ordered are listed, but only abnormal results are displayed) Labs Reviewed  CBC WITH DIFFERENTIAL/PLATELET - Abnormal; Notable for the following components:      Result Value   WBC 12.3 (*)    Neutro  Abs 7.8 (*)    Monocytes Absolute 1.1 (*)    Abs Immature Granulocytes 0.16 (*)    All other components within normal limits  COMPREHENSIVE METABOLIC PANEL - Abnormal; Notable for the following components:   Chloride 97 (*)    BUN 41 (*)    Creatinine, Ser 1.55 (*)    Total Protein 8.4 (*)    AST 54 (*)    ALT 45 (*)    Alkaline Phosphatase 130 (*)    GFR calc non Af Amer 30 (*)    GFR calc Af Amer 35 (*)    All other components within normal limits  CK - Abnormal; Notable for the following components:   Total CK 347 (*)    All other components within normal limits  URINALYSIS, ROUTINE W REFLEX MICROSCOPIC - Abnormal; Notable for the following components:   Hgb urine dipstick MODERATE (*)    Leukocytes,Ua MODERATE (*)    Bacteria, UA RARE (*)    All other components within normal limits  SARS CORONAVIRUS 2 (HOSPITAL ORDER, [facility])  URINE CULTURE    EKG None  Radiology Ct Head Wo Contrast  Result Date: 12/17/2018 CLINICAL DATA:  Altered level of consciousness. Recently treated for urinary tract infection. EXAM: CT HEAD WITHOUT CONTRAST TECHNIQUE: Contiguous axial images were obtained from the base of the skull through the vertex without intravenous contrast. COMPARISON:  CT head 10/16/2018. FINDINGS: Brain: There is no evidence of acute intracranial hemorrhage, mass lesion, brain edema or extra-axial fluid collection. Stable mild atrophy with mild prominence of the ventricles and subarachnoid spaces. Patchy low-density in the periventricular white matter is stable, most likely due to chronic small vessel ischemic changes. There is no CT evidence of acute cortical  infarction. Vascular: Prominent intracranial vascular calcifications. No hyperdense vessel identified. Skull: Negative for fracture or focal lesion. Sinuses/Orbits: Stable mild mucosal thickening in the ethmoid and sphenoid sinuses. The mastoid air cells and middle ears are clear. No orbital  abnormalities. Other: None. IMPRESSION: Stable head CT without acute intracranial findings. Mild atrophy and chronic small vessel ischemic changes. Electronically Signed   By: Richardean Sale M.D.   On: 12/17/2018 11:48    Procedures Procedures (including critical care time)  Medications Ordered in ED Medications  sodium chloride 0.9 % bolus 500 mL (500 mLs Intravenous Bolus from Bag 12/17/18 0916)  cefTRIAXone (ROCEPHIN) 1 g in sodium chloride 0.9 % 100 mL IVPB (0 g Intravenous Stopped 12/17/18 1159)     Initial Impression / Assessment and Plan / ED Course  I have reviewed the triage vital signs and the nursing notes.  Pertinent labs & imaging results that were available during my care of the patient were reviewed by me and considered in my medical decision making (see chart for details).       Patient with increased confusion and delusional thoughts appears to be acutely changed from her baseline.  Mild elevation in white blood cell count.  COVID is negative.  Does appear to have evidence of urinary tract infection.  Urine sent for culture.  Given dose of IV Rocephin.  Patient continues to have delusional thoughts about her grandmother visiting her.  Discussed with hospitalist will see patient emergency department and admit.  Final Clinical Impressions(s) / ED Diagnoses   Final diagnoses:  Metabolic encephalopathy  Acute lower UTI    ED Discharge Orders    None       Julianne Rice, MD 12/17/18 1353

## 2018-12-17 NOTE — Progress Notes (Signed)
Attempted to call for report no answer. Will attempt again.

## 2018-12-17 NOTE — ED Notes (Signed)
MD at bedside. 

## 2018-12-17 NOTE — H&P (Signed)
History and Physical    FLETCHER SACCOMANNO [id number] DOB: 12/09/1930 DOA: 12/17/2018  PCP: Haywood Pao, MD Patient coming from: ALF.  Reportedly uses walker for ambulation.  Chief Complaint: "I fell"  HPI: KYNSLI UNDERDOWN is a 85 y.o. female with history of diastolic CHF, IDDM-2, CKD 3, nonobstructive CAD, HTN, RA, OSA not on CPAP and depression presenting with "altered mental status" after ground-level fall yesterday.  Patient is alert, awake and oriented x4- date.  She thinks today is September 3.  Patient reports ground-level fall on her back yesterday.  She says she lost her balance.  Denies any prodrome leading to this.  Denies hitting her head or loss of consciousness.  She says facility called EMS because they could not move her this morning.  She says both legs did not feel right.  When asked to explain, she says they are not as flexible at the used to be.  She denies new numbness, tingling or weakness.  She reports mild headache which is chronic for her.  Denies vision change, fever, chills, cough, chest pain, shortness of breath, nausea, vomiting, abdominal pain, diarrhea or UTI symptoms.  Admits to back pain from being in recliner for long time.  She states she usually sleeps in recliner.  She also admits to constipation but states that her last bowel movement was yesterday.  Denies smoking cigarettes, drinking alcohol recreational drug use.  Reports using walker for ambulation at baseline.  Denies smoking cigarettes, drinking alcohol recreational drug use.  She says she does not want to be revived or intubated if her heart has to stop or she stops breathing.  She is able to reiterate this back to me.   Per Mr. Sherwood, son-in-law, she slipped out of her recliner chair two days ago. She has been intermittently confused for the last one to two weeks. Otherwise, she is able to hold reasonable conversation. She is weaker since COVID-19 due to restrictions but walks with walker. He confirms  that she DNR. He says his wife is HCPOA. He was able to connect me to her over the phone, and she affirmed about DNR. Mr Conley Simmonds and Mrs. Conley Simmonds are travelling out of town.  In ED, hemodynamically stable.  Creatinine 1.55 (baseline).  BUN 41.  AST 54.  ALT 45. WBC 12.3.  Otherwise CMP and CBC not impressive.  CK 347.  COVID-19 negative.  CT head without acute finding.  UA with moderate Hgb and moderate WBC and rare bacteria.  Urine culture sent.  Received normal saline bolus 500 cc and IV ceftriaxone.  Hospitalist service was called for admission for acute metabolic encephalopathy due to UTI.  ROS All review of system negative except for pertinent positives and negatives as history of present illness above.  PMH Past Medical History:  Diagnosis Date  . Arthritis    "hips before they were replaced, right knee now" (11/09/2015)  . Chronic diastolic (congestive) heart failure (HCC)    a. echo 06/2015: EF 65-70%, Grade 1 DD, mitral valve calcification.  . CKD (chronic kidney disease), stage III ([place])   . Depression   . Dyspnea   . H/O cardiac catheterization    a. h/o cardiac catheterization 05/2013 in [place] showing only minimal CAD with increased filling pressure.  . Hyperlipemia   . Hypertensive heart disease   . Morbid obesity ([place])   . Palsy, Bell's   . RA (rheumatoid arthritis) ([place])   . Sleep apnea    "suppose to wear mask; couldn't" (  11/09/2015)  . Type II diabetes mellitus (HCC)    PSH Past Surgical History:  Procedure Laterality Date  . APPENDECTOMY  1966  . CARDIAC CATHETERIZATION  05/2013  . CARPAL TUNNEL RELEASE Right 08/08/2014   Procedure: OPEN CARPAL TUNNEL RELEASE;  Surgeon: Roseanne Kaufman, MD;  Location: [facility];  Service: Orthopedics;  Laterality: Right;  . CATARACT EXTRACTION W/ INTRAOCULAR LENS  IMPLANT, BILATERAL Bilateral   . CHOLECYSTECTOMY OPEN  1966  . COLONOSCOPY    . FRACTURE SURGERY    . JOINT REPLACEMENT    . KNEE ARTHROPLASTY Left   . ORIF WRIST  FRACTURE Right 08/08/2014   Procedure: Right OPEN REDUCTION INTERNAL FIXATION (ORIF) WRIST FRACTURE WITH REPAIR AND RECONST/ALLOGRAFT AND BONE GRAFT;  Surgeon: Roseanne Kaufman, MD;  Location: [place];  Service: Orthopedics;  Laterality: Right;  . TOTAL HIP ARTHROPLASTY Bilateral    Fam HX Family History  Problem Relation Age of Onset  . Sudden death Mother   . Stroke Mother   . Hypertension Neg Hx     Social Hx  reports that she has never smoked. She has never used smokeless tobacco. She reports current alcohol use. She reports that she does not use drugs.  Allergy No Known Allergies Home Meds Prior to Admission medications   Medication Sig Start Date End Date Taking? Authorizing Provider  ACCU-CHEK AVIVA PLUS test strip TEST BS QID 12/24/17   [provider]  aspirin EC 81 MG tablet Take 81 mg by mouth daily.    [provider]  calcium carbonate (TUMS EX) 750 MG chewable tablet Chew 1 tablet by mouth daily.    [provider]  carvedilol (COREG CR) 80 MG 24 hr capsule TAKE 1 CAPSULE BY MOUTH  DAILY Patient taking differently: Take 80 mg by mouth daily.  09/01/18   Richardson Dopp T, PA-C  chlorhexidine (PERIDEX) 0.12 % solution Use as directed 5 mLs in the mouth or throat daily. Swish and spit 01/17/15   [provider]  cholecalciferol (VITAMIN D) 1000 units tablet Take 3,000 Units by mouth daily.    [provider]  ezetimibe-simvastatin (VYTORIN) 10-40 MG tablet TAKE 1 TABLET BY MOUTH  DAILY Patient taking differently: Take 1 tablet by mouth at bedtime.  09/01/18   Richardson Dopp T, PA-C  insulin aspart (NOVOLOG FLEXPEN) 100 UNIT/ML FlexPen Inject 15 Units into the skin See admin instructions. Inject 15 units subcutaneously two to three times daily with meals    [provider]  Insulin Detemir (LEVEMIR FLEXTOUCH) 100 UNIT/ML Pen Inject 60 Units into the skin at bedtime.     [provider]  magnesium oxide (MAG-OX) 400 MG tablet  Take 1 tablet (400 mg total) by mouth daily. 07/07/16   Fay Records, MD  potassium chloride SA (K-DUR,KLOR-CON) 20 MEQ tablet TAKE 1 TABLET(20 MEQ) BY MOUTH DAILY Patient taking differently: Take 20 mEq by mouth daily.  04/06/17   Richardson Dopp T, PA-C  torsemide (DEMADEX) 20 MG tablet Take 2 tablets (40 mg total) by mouth daily. Please make yearly appt with Dr. Harrington Challenger for December for future refills. 1st attempt 12/14/18   Fay Records, MD  Vitamin D, Ergocalciferol, (DRISDOL) 50000 UNITS CAPS capsule Take 50,000 Units by mouth every Wednesday.     [provider]    Physical Exam: Vitals:   12/17/18 1100 12/17/18 1200 12/17/18 1330 12/17/18 1345  BP: (!) 148/54 (!) 134/54 (!) 134/52   Pulse: 87 88 89 89  Resp: (!) 8  17 12 10   Temp:      TempSrc:      SpO2: 93% 92% 94% 94%    GENERAL: No acute distress.  Appears well.  HEENT: MMM.  Vision and hearing grossly intact.  NECK: Supple.  No apparent JVD.  RESP:  No IWOB.  Fair air movement bilaterally. CVS:  RRR. Heart sounds normal.  ABD/GI/GU: Bowel sounds present. Soft. Non tender.  MSK/EXT:  Moves extremities. No apparent deformity or edema.  Venous stasis. SKIN: Skin change from venous insufficiency in both extremities. NEURO: Awake, alert and oriented x4- date.  She thinks the date is September 3.  Cranial nerves intact.  Motor 3+/5 in both lower extremities.  5/5 in both upper extremities.  Light sensation intact in all dermatomes.  Patellar reflex symmetric.  No pronator drift. PSYCH: Calm. Normal affect.   Personally Reviewed Radiological Exams Ct Head Wo Contrast  Result Date: 12/17/2018 CLINICAL DATA:  Altered level of consciousness. Recently treated for urinary tract infection. EXAM: CT HEAD WITHOUT CONTRAST TECHNIQUE: Contiguous axial images were obtained from the base of the skull through the vertex without intravenous contrast. COMPARISON:  CT head 10/16/2018. FINDINGS: Brain: There is no evidence of acute intracranial  hemorrhage, mass lesion, brain edema or extra-axial fluid collection. Stable mild atrophy with mild prominence of the ventricles and subarachnoid spaces. Patchy low-density in the periventricular white matter is stable, most likely due to chronic small vessel ischemic changes. There is no CT evidence of acute cortical infarction. Vascular: Prominent intracranial vascular calcifications. No hyperdense vessel identified. Skull: Negative for fracture or focal lesion. Sinuses/Orbits: Stable mild mucosal thickening in the ethmoid and sphenoid sinuses. The mastoid air cells and middle ears are clear. No orbital abnormalities. Other: None. IMPRESSION: Stable head CT without acute intracranial findings. Mild atrophy and chronic small vessel ischemic changes. Electronically Signed   By: Richardean Sale M.D.   On: 12/17/2018 11:48     Personally Reviewed Labs: CBC: Recent Labs  Lab 12/17/18 0912  WBC 12.3*  NEUTROABS 7.8*  HGB 13.1  HCT 43.3  MCV 98.2  PLT [id number]   Basic Metabolic Panel: Recent Labs  Lab 12/17/18 0912  NA 136  K 4.0  CL 97*  CO2 27  GLUCOSE 78  BUN 41*  CREATININE 1.55*  CALCIUM 9.0   GFR: CrCl cannot be calculated (Unknown ideal weight.). Liver Function Tests: Recent Labs  Lab 12/17/18 0912  AST 54*  ALT 45*  ALKPHOS 130*  BILITOT 0.5  PROT 8.4*  ALBUMIN 3.6   No results for input(s): LIPASE, AMYLASE in the last 168 hours. No results for input(s): AMMONIA in the last 168 hours. Coagulation Profile: No results for input(s): INR, PROTIME in the last 168 hours. Cardiac Enzymes: Recent Labs  Lab 12/17/18 0912  CKTOTAL 347*   BNP (last 3 results) No results for input(s): PROBNP in the last 8760 hours. HbA1C: No results for input(s): HGBA1C in the last 72 hours. CBG: No results for input(s): GLUCAP in the last 168 hours. Lipid Profile: No results for input(s): CHOL, HDL, LDLCALC, TRIG, CHOLHDL, LDLDIRECT in the last 72 hours. Thyroid Function Tests: No  results for input(s): TSH, T4TOTAL, FREET4, T3FREE, THYROIDAB in the last 72 hours. Anemia Panel: No results for input(s): VITAMINB12, FOLATE, FERRITIN, TIBC, IRON, RETICCTPCT in the last 72 hours. Urine analysis:    Component Value Date/Time   COLORURINE YELLOW 12/17/2018 0849   APPEARANCEUR CLEAR 12/17/2018 0849   APPEARANCEUR Cloudy (A) 08/12/2016 1312   LABSPEC 1.013  12/17/2018 0849   PHURINE 6.0 12/17/2018 0849   GLUCOSEU NEGATIVE 12/17/2018 0849   HGBUR MODERATE (A) 12/17/2018 0849   BILIRUBINUR NEGATIVE 12/17/2018 0849   BILIRUBINUR Negative 08/12/2016 1312   KETONESUR NEGATIVE 12/17/2018 0849   PROTEINUR NEGATIVE 12/17/2018 0849   NITRITE NEGATIVE 12/17/2018 0849   LEUKOCYTESUR MODERATE (A) 12/17/2018 0849    Sepsis Labs:  None.  Personally Reviewed EKG:  EKG ordered but pending.  Assessment/Plan Fall at assisted living facility Mild rhabdomyolysis Generalized weakness -Neuro exam reassuring except for bilateral lower extremity weakness at 3+/5 which could be her baseline. -CT head without contrast without acute finding. -CK slightly elevated to 342. -IV normal saline at 75 cc/h for 24 hours. -Recheck CK in the morning -PT/OT eval -Fall precaution  "Acute metabolic encephalopathy": reportedly altered due to UTI per ED report.  However, she is oriented x4- date on my exam.  She also denies UTI symptoms.  She could have delirium in the setting of fall. -Delirium precautions -Avoid sedating medications  Elevated liver enzymes: Suspect this to be due to CK. -Recheck in the morning  IDDM-2: CBG 78. -Check hemoglobin A1c -Lantus 15 units nightly -SSI-thin -Hold home statin in the setting of mild rhabdo.  CKD-3 with mild azotemia: Creatinine 1.55-baseline -Continue monitoring  Mild leukocytosis: Likely due to marginalization versus infectious process -Continue monitoring  Hypertension: Normotensive -Resume home meds after med rec  Chronic diastolic CHF:  Appears euvolemic.  No cardiopulmonary symptoms.  Echo in 2018 with EF of 55 to 60%, G1 DD. -We will hold home diuretics -Gentle hydration due to mild rhabdo -Daily weight and intake and output  Mild CAD: heart LHC in 2015 with mild CAD. -Resume home meds after med rec.  OSA -Nightly CPAP  History of rheumatoid arthritis: not on any medication but will wait on med rec.  Depression: Stable.  Not on medication but waiting on med rec.  DVT prophylaxis: Subcu Lovenox  Code Status: DNR-confirmed with patient's daughter and son-in-law. Family Communication: Not able to get in touch with patient's daughter listed in the chart  Disposition Plan: Admit to [place] called: None Admission status: Observation   Mercy Riding MD [facility]  If 7PM-7AM, please contact night-coverage [email or web address] Password [email or web address]  12/17/2018, 2:31 PM

## 2018-12-17 NOTE — ED Notes (Signed)
Report given to Hannah,RN

## 2018-12-17 NOTE — ED Triage Notes (Signed)
Pt presents with c/o altered mental status. Pt lives in independent living at [facility] facility and when staff entered her room this morning, they found her in an awkward position in her recliner. EMS was called for lifting assistance and one of the staff members reported that she was in an altered state. Pt reported she has recently been treated for a UTI but EMS couldn't locate the antibiotics. EMS reports a strong smell of urine on pt. Pt is typically A&O x 4 but when EMS arrived, pt thought she was in the floor. Pt told this RN upon entering the room that we were not going to take any of her organs.

## 2018-12-18 ENCOUNTER — Encounter (HOSPITAL_COMMUNITY): Payer: Self-pay | Admitting: *Deleted

## 2018-12-18 ENCOUNTER — Inpatient Hospital Stay (HOSPITAL_COMMUNITY): Payer: Medicare HMO

## 2018-12-18 DIAGNOSIS — Y92099 Unspecified place in other non-institutional residence as the place of occurrence of the external cause: Secondary | ICD-10-CM | POA: Diagnosis not present

## 2018-12-18 DIAGNOSIS — W1830XA Fall on same level, unspecified, initial encounter: Secondary | ICD-10-CM | POA: Diagnosis present

## 2018-12-18 DIAGNOSIS — R6510 Systemic inflammatory response syndrome (SIRS) of non-infectious origin without acute organ dysfunction: Secondary | ICD-10-CM | POA: Diagnosis not present

## 2018-12-18 DIAGNOSIS — I130 Hypertensive heart and chronic kidney disease with heart failure and stage 1 through stage 4 chronic kidney disease, or unspecified chronic kidney disease: Secondary | ICD-10-CM | POA: Diagnosis not present

## 2018-12-18 DIAGNOSIS — I5032 Chronic diastolic (congestive) heart failure: Secondary | ICD-10-CM | POA: Diagnosis not present

## 2018-12-18 DIAGNOSIS — Z6837 Body mass index (BMI) 37.0-37.9, adult: Secondary | ICD-10-CM | POA: Diagnosis not present

## 2018-12-18 DIAGNOSIS — I2510 Atherosclerotic heart disease of native coronary artery without angina pectoris: Secondary | ICD-10-CM | POA: Diagnosis present

## 2018-12-18 DIAGNOSIS — G9341 Metabolic encephalopathy: Secondary | ICD-10-CM | POA: Diagnosis not present

## 2018-12-18 DIAGNOSIS — F329 Major depressive disorder, single episode, unspecified: Secondary | ICD-10-CM | POA: Diagnosis present

## 2018-12-18 DIAGNOSIS — Z7982 Long term (current) use of aspirin: Secondary | ICD-10-CM | POA: Diagnosis not present

## 2018-12-18 DIAGNOSIS — L89152 Pressure ulcer of sacral region, stage 2: Secondary | ICD-10-CM | POA: Diagnosis present

## 2018-12-18 DIAGNOSIS — Z96652 Presence of left artificial knee joint: Secondary | ICD-10-CM | POA: Diagnosis present

## 2018-12-18 DIAGNOSIS — M069 Rheumatoid arthritis, unspecified: Secondary | ICD-10-CM | POA: Diagnosis present

## 2018-12-18 DIAGNOSIS — N183 Chronic kidney disease, stage 3 (moderate): Secondary | ICD-10-CM | POA: Diagnosis not present

## 2018-12-18 DIAGNOSIS — Z9049 Acquired absence of other specified parts of digestive tract: Secondary | ICD-10-CM | POA: Diagnosis not present

## 2018-12-18 DIAGNOSIS — M109 Gout, unspecified: Secondary | ICD-10-CM | POA: Diagnosis present

## 2018-12-18 DIAGNOSIS — Y92129 Unspecified place in nursing home as the place of occurrence of the external cause: Secondary | ICD-10-CM | POA: Diagnosis not present

## 2018-12-18 DIAGNOSIS — M6282 Rhabdomyolysis: Secondary | ICD-10-CM | POA: Diagnosis not present

## 2018-12-18 DIAGNOSIS — G9340 Encephalopathy, unspecified: Secondary | ICD-10-CM

## 2018-12-18 DIAGNOSIS — Z20828 Contact with and (suspected) exposure to other viral communicable diseases: Secondary | ICD-10-CM | POA: Diagnosis not present

## 2018-12-18 DIAGNOSIS — Z96643 Presence of artificial hip joint, bilateral: Secondary | ICD-10-CM | POA: Diagnosis present

## 2018-12-18 DIAGNOSIS — E860 Dehydration: Secondary | ICD-10-CM | POA: Diagnosis not present

## 2018-12-18 DIAGNOSIS — E785 Hyperlipidemia, unspecified: Secondary | ICD-10-CM | POA: Diagnosis present

## 2018-12-18 DIAGNOSIS — G4733 Obstructive sleep apnea (adult) (pediatric): Secondary | ICD-10-CM | POA: Diagnosis present

## 2018-12-18 DIAGNOSIS — Z66 Do not resuscitate: Secondary | ICD-10-CM | POA: Diagnosis not present

## 2018-12-18 DIAGNOSIS — D72829 Elevated white blood cell count, unspecified: Secondary | ICD-10-CM | POA: Diagnosis not present

## 2018-12-18 DIAGNOSIS — E1122 Type 2 diabetes mellitus with diabetic chronic kidney disease: Secondary | ICD-10-CM | POA: Diagnosis present

## 2018-12-18 DIAGNOSIS — N390 Urinary tract infection, site not specified: Secondary | ICD-10-CM | POA: Diagnosis not present

## 2018-12-18 DIAGNOSIS — W19XXXA Unspecified fall, initial encounter: Secondary | ICD-10-CM | POA: Diagnosis not present

## 2018-12-18 DIAGNOSIS — R918 Other nonspecific abnormal finding of lung field: Secondary | ICD-10-CM | POA: Diagnosis not present

## 2018-12-18 LAB — URINE CULTURE: Culture: NO GROWTH

## 2018-12-18 LAB — COMPREHENSIVE METABOLIC PANEL
ALT: 27 U/L (ref 0–44)
AST: 34 U/L (ref 15–41)
Albumin: 2.5 g/dL — ABNORMAL LOW (ref 3.5–5.0)
Alkaline Phosphatase: 92 U/L (ref 38–126)
Anion gap: 7 (ref 5–15)
BUN: 35 mg/dL — ABNORMAL HIGH (ref 8–23)
CO2: 24 mmol/L (ref 22–32)
Calcium: 7.8 mg/dL — ABNORMAL LOW (ref 8.9–10.3)
Chloride: 105 mmol/L (ref 98–111)
Creatinine, Ser: 1.41 mg/dL — ABNORMAL HIGH (ref 0.44–1.00)
GFR calc Af Amer: 39 mL/min — ABNORMAL LOW (ref >60)
GFR calc non Af Amer: 34 mL/min — ABNORMAL LOW (ref >60)
Glucose, Bld: 197 mg/dL — ABNORMAL HIGH (ref 70–99)
Potassium: 4.2 mmol/L (ref 3.5–5.1)
Sodium: 136 mmol/L (ref 135–145)
Total Bilirubin: 0.6 mg/dL (ref 0.3–1.2)
Total Protein: 5.8 g/dL — ABNORMAL LOW (ref 6.5–8.1)

## 2018-12-18 LAB — CBC
HCT: 35.6 % — ABNORMAL LOW (ref 36.0–46.0)
Hemoglobin: 10.4 g/dL — ABNORMAL LOW (ref 12.0–15.0)
MCH: 29.5 pg (ref 26.0–34.0)
MCHC: 29.2 g/dL — ABNORMAL LOW (ref 30.0–36.0)
MCV: 101.1 fL — ABNORMAL HIGH (ref 80.0–100.0)
Platelets: 207 10*3/uL (ref 150–400)
RBC: 3.52 MIL/uL — ABNORMAL LOW (ref 3.87–5.11)
RDW: 14.9 % (ref 11.5–15.5)
WBC: 8.4 10*3/uL (ref 4.0–10.5)
nRBC: 0 % (ref 0.0–0.2)

## 2018-12-18 LAB — GLUCOSE, CAPILLARY
Glucose-Capillary: 191 mg/dL — ABNORMAL HIGH (ref 70–99)
Glucose-Capillary: 205 mg/dL — ABNORMAL HIGH (ref 70–99)
Glucose-Capillary: 330 mg/dL — ABNORMAL HIGH (ref 70–99)
Glucose-Capillary: 260 mg/dL — ABNORMAL HIGH (ref 70–99)

## 2018-12-18 LAB — PROCALCITONIN: Procalcitonin: <0.1 ng/mL

## 2018-12-18 LAB — CK: Total CK: 161 U/L (ref 38–234)

## 2018-12-18 MED ORDER — INSULIN GLARGINE 100 UNIT/ML ~~LOC~~ SOLN
30.0000 [IU] | Freq: Two times a day (BID) | SUBCUTANEOUS | Status: DC
  Administered 2018-12-18 – 2018-12-21 (×6): 30 [IU] via SUBCUTANEOUS
  Filled 2018-12-18 (×8): qty 0.3

## 2018-12-18 NOTE — Progress Notes (Signed)
Pt. Refuses CPAP stating that she has never seen that machine before and does not ever wear.  Reminded patient that she wore the device last night and she called this Probation officer a Control and instrumentation engineer.  Will be available if patient changes her mind.

## 2018-12-18 NOTE — Progress Notes (Signed)
PROGRESS NOTE    Brandi Melton  [id number] DOB: Nov 29, 1930 DOA: 12/17/2018 PCP: Haywood Pao, MD  Outpatient Specialists:     Brief Narrative:  Brandi Melton is a 85 y.o. female with history of diastolic CHF, IDDM-2, CKD 3, nonobstructive CAD, HTN, RA, OSA not on CPAP and depression.  Patient was admitted with altered mental status, fever with T-max of 100.5 F and history of fall.  Patient remains significantly confused and, therefore, could not give significant history.  Urine culture has not grown any organisms.  Will check procalcitonin, chest x-ray and will culture patient's blood.  Patient was volume depleted on presentation.  Patient is currently being assessed by PT OT team.  We will continue work-up for for now.  We will continue to hydrate patient.  Further management will depend on hospital course.  Patient may have underlying dementing illness.  Patient's HbA1c is 10.6%.  Assessment & Plan:   Active Problems:   Fall at nursing home   Acute encephalopathy  Fall at assisted living facility Mild rhabdomyolysis Generalized weakness -Neuro exam reassuring except for bilateral lower extremity weakness at 3+/5 which could be her baseline. -CT head without contrast without acute finding. -CK slightly elevated to 342. -IV normal saline at 75 cc/h for 24 hours. -Recheck CK in the morning -PT/OT eval -Fall precaution 12/18/2018: Patient is currently being assessed by PT and OT.  Patient may need skilled nursing facility placement.   Acute encephalopathy, possibly metabolic versus combined metabolic and toxic:  Urine culture has not grown any organisms.   Continue IV fluid  - Continue work-up for possible etiology of acute encephalopathy apart from volume depletion. -Chest x-ray, blood culture, procalcitonin, repeat CPK. -Patient remains confused.  Will change patient's status to inpatient. -Further management depend on hospital course.  Elevated liver enzymes: Suspect  this to be due to CK. -Recheck CPK.  IDDM-2: CBG 78. -Check hemoglobin A1c -Lantus 15 units nightly -SSI-thin -Hold home statin in the setting of mild rhabdo. 12/18/2018: Blood sugars not controlled.  He will be A1c is 10.6%.  Will increase subcutaneous Lantus to 30 units twice daily.  Blood sugar measured and there was 330.  CKD-3: Stable.    Mild leukocytosis: Likely due to marginalization versus infectious process 12/18/2018: Resolved.  Hypertension:  Stable.  Continue to monitor.  Chronic diastolic CHF: Appears euvolemic.  No cardiopulmonary symptoms.  Echo in 2018 with EF of 55 to 60%, G1 DD. -We will hold home diuretics -Gentle hydration due to mild rhabdo -Daily weight and intake and output 12/18/2018: Stable.  No signs of congestive heart failure.  Mild CAD: heart LHC in 2015 with mild CAD. -Resume home meds after med rec.  OSA -Nightly CPAP  History of rheumatoid arthritis: not on any medication but will wait on med rec.  Depression: Stable.  Not on medication but waiting on med rec.  DVT prophylaxis: Subcu Lovenox     Code Status: DNR Family Communication:  Disposition Plan: Likely skilled nursing facility.   Consults called: None  Consultants:   None  Procedures:   None  Antimicrobials:   None   Subjective: No new complaints.  Patient could not give significant history, but remembers having fallen.    Objective: Vitals:   12/17/18 2118 12/18/18 0433 12/18/18 0629 12/18/18 1445  BP: (!) 111/92 (!) 122/48  (!) 142/51  Pulse: 89 77  80  Resp: 15 16  14   Temp: 99.2 F (37.3 C) (!) 100.5 F (38.1 C) 99.5  F (37.5 C) 98.9 F (37.2 C)  TempSrc: Oral Axillary Axillary   SpO2: 93% 96%  91%  Weight:  102.6 kg      Intake/Output Summary (Last 24 hours) at 12/18/2018 1714 Last data filed at 12/18/2018 1400 Gross per 24 hour  Intake 2094.8 ml  Output 0 ml  Net 2094.8 ml   Filed Weights   12/18/18 0433  Weight: 102.6 kg    Examination:   General exam: Appears calm and comfortable.  Dry buccal mucosa. Respiratory system: Clear to auscultation.  Cardiovascular system: S1 & S2  Gastrointestinal system: Abdomen is obese, soft and nontender.  Organs are difficult to assess.   Central nervous system: Awake and alert.  Patient remains confused patient was all extremities. Extremities: No leg edema.    Data Reviewed: I have personally reviewed following labs and imaging studies  CBC: Recent Labs  Lab 12/17/18 0912 12/17/18 1648 12/18/18 0311  WBC 12.3* 10.6* 8.4  NEUTROABS 7.8*  --   --   HGB 13.1 11.5* 10.4*  HCT 43.3 37.8 35.6*  MCV 98.2 98.4 101.1*  PLT 268 245 [id number]   Basic Metabolic Panel: Recent Labs  Lab 12/17/18 0912 12/17/18 1648 12/18/18 0311  NA 136  --  136  K 4.0  --  4.2  CL 97*  --  105  CO2 27  --  24  GLUCOSE 78  --  197*  BUN 41*  --  35*  CREATININE 1.55* 1.34* 1.41*  CALCIUM 9.0  --  7.8*   GFR: Estimated Creatinine Clearance: 34 mL/min (A) (by C-G formula based on SCr of 1.41 mg/dL (H)). Liver Function Tests: Recent Labs  Lab 12/17/18 0912 12/18/18 0311  AST 54* 34  ALT 45* 27  ALKPHOS 130* 92  BILITOT 0.5 0.6  PROT 8.4* 5.8*  ALBUMIN 3.6 2.5*   No results for input(s): LIPASE, AMYLASE in the last 168 hours. No results for input(s): AMMONIA in the last 168 hours. Coagulation Profile: No results for input(s): INR, PROTIME in the last 168 hours. Cardiac Enzymes: Recent Labs  Lab 12/17/18 0912 12/18/18 1441  CKTOTAL 347* 161   BNP (last 3 results) No results for input(s): PROBNP in the last 8760 hours. HbA1C: Recent Labs    12/17/18 1648  HGBA1C 10.6*   CBG: Recent Labs  Lab 12/17/18 1619 12/17/18 2118 12/18/18 0920 12/18/18 1150 12/18/18 1635  GLUCAP 85 253* 191* 205* 330*   Lipid Profile: No results for input(s): CHOL, HDL, LDLCALC, TRIG, CHOLHDL, LDLDIRECT in the last 72 hours. Thyroid Function Tests: Recent Labs    12/17/18 1648  TSH 1.071    Anemia Panel: No results for input(s): VITAMINB12, FOLATE, FERRITIN, TIBC, IRON, RETICCTPCT in the last 72 hours. Urine analysis:    Component Value Date/Time   COLORURINE YELLOW 12/17/2018 0849   APPEARANCEUR CLEAR 12/17/2018 0849   APPEARANCEUR Cloudy (A) 08/12/2016 1312   LABSPEC 1.013 12/17/2018 0849   PHURINE 6.0 12/17/2018 0849   GLUCOSEU NEGATIVE 12/17/2018 0849   HGBUR MODERATE (A) 12/17/2018 0849   BILIRUBINUR NEGATIVE 12/17/2018 0849   BILIRUBINUR Negative 08/12/2016 1312   KETONESUR NEGATIVE 12/17/2018 0849   PROTEINUR NEGATIVE 12/17/2018 0849   NITRITE NEGATIVE 12/17/2018 0849   LEUKOCYTESUR MODERATE (A) 12/17/2018 0849   Sepsis Labs: @LABRCNTIP (procalcitonin:4,lacticidven:4)  ) Recent Results (from the past 240 hour(s))  SARS Coronavirus 2 [facility] order, Performed in [facility] hospital lab) Nasopharyngeal Nasopharyngeal Swab     Status: None   Collection Time: 12/17/18  8:50  AM   Specimen: Nasopharyngeal Swab  Result Value Ref Range Status   SARS Coronavirus 2 NEGATIVE NEGATIVE Final    Comment: (NOTE) If result is NEGATIVE SARS-CoV-2 target nucleic acids are NOT DETECTED. The SARS-CoV-2 RNA is generally detectable in upper and lower  respiratory specimens during the acute phase of infection. The lowest  concentration of SARS-CoV-2 viral copies this assay can detect is 250  copies / mL. A negative result does not preclude SARS-CoV-2 infection  and should not be used as the sole basis for treatment or other  patient management decisions.  A negative result may occur with  improper specimen collection / handling, submission of specimen other  than nasopharyngeal swab, presence of viral mutation(s) within the  areas targeted by this assay, and inadequate number of viral copies  (<250 copies / mL). A negative result must be combined with clinical  observations, patient history, and epidemiological information. If result is POSITIVE SARS-CoV-2 target nucleic  acids are DETECTED. The SARS-CoV-2 RNA is generally detectable in upper and lower  respiratory specimens dur ing the acute phase of infection.  Positive  results are indicative of active infection with SARS-CoV-2.  Clinical  correlation with patient history and other diagnostic information is  necessary to determine patient infection status.  Positive results do  not rule out bacterial infection or co-infection with other viruses. If result is PRESUMPTIVE POSTIVE SARS-CoV-2 nucleic acids MAY BE PRESENT.   A presumptive positive result was obtained on the submitted specimen  and confirmed on repeat testing.  While 2019 novel coronavirus  (SARS-CoV-2) nucleic acids may be present in the submitted sample  additional confirmatory testing may be necessary for epidemiological  and / or clinical management purposes  to differentiate between  SARS-CoV-2 and other Sarbecovirus currently known to infect humans.  If clinically indicated additional testing with an alternate test  methodology [phone]) is advised. The SARS-CoV-2 RNA is generally  detectable in upper and lower respiratory sp ecimens during the acute  phase of infection. The expected result is Negative. Fact Sheet for Patients:  StrictlyIdeas.no Fact Sheet for Healthcare Providers: BankingDealers.co.za This test is not yet approved or cleared by the [place] FDA and has been authorized for detection and/or diagnosis of SARS-CoV-2 by FDA under an Emergency Use Authorization (EUA).  This EUA will remain in effect (meaning this test can be used) for the duration of the COVID-19 declaration under Section 564(b)(1) of the Act, 21 U.S.C. section 360bbb-3(b)(1), unless the authorization is terminated or revoked sooner. Performed at [facility], [place] [address] [place], [place] [address]   Urine culture     Status: None   Collection Time: 12/17/18  9:57 AM    Specimen: Urine, Catheterized  Result Value Ref Range Status   Specimen Description   Final    URINE, CATHETERIZED Performed at [facility] [address], [place], [place] [address]    Special Requests   Final    NONE Performed at [facility], [place] [address], [place], [place] [address]    Culture   Final    NO GROWTH Performed at [facility], [place] [address] [place], [place] [address]    Report Status 12/18/2018 FINAL  Final         Radiology Studies: Ct Head Wo Contrast  Result Date: 12/17/2018 CLINICAL DATA:  Altered level of consciousness. Recently treated for urinary tract infection. EXAM: CT HEAD WITHOUT CONTRAST TECHNIQUE: Contiguous axial images were obtained  from the base of the skull through the vertex without intravenous contrast. COMPARISON:  CT head 10/16/2018. FINDINGS: Brain: There is no evidence of acute intracranial hemorrhage, mass lesion, brain edema or extra-axial fluid collection. Stable mild atrophy with mild prominence of the ventricles and subarachnoid spaces. Patchy low-density in the periventricular white matter is stable, most likely due to chronic small vessel ischemic changes. There is no CT evidence of acute cortical infarction. Vascular: Prominent intracranial vascular calcifications. No hyperdense vessel identified. Skull: Negative for fracture or focal lesion. Sinuses/Orbits: Stable mild mucosal thickening in the ethmoid and sphenoid sinuses. The mastoid air cells and middle ears are clear. No orbital abnormalities. Other: None. IMPRESSION: Stable head CT without acute intracranial findings. Mild atrophy and chronic small vessel ischemic changes. Electronically Signed   By: Richardean Sale M.D.   On: 12/17/2018 11:48   Dg Chest Port 1 View  Result Date: 12/18/2018 CLINICAL DATA:  Acute encephalopathy. Pt presented to ER yesterday with c/o AMS. EXAM: PORTABLE CHEST 1 VIEW COMPARISON:  Chest radiograph  04/09/2017, 04/06/2017, 11/08/2015 FINDINGS: Stable cardiomediastinal contours with mildly enlarged heart size. Low volume study. Mild linear opacities at the left lung base likely reflect atelectasis or scarring. No pneumothorax or pleural effusion. No acute osseous abnormality in visualized portions of the skeleton. IMPRESSION: Mild opacities at the left base favored to represent atelectasis or scarring, less likely developing infiltrate. Electronically Signed   By: Audie Pinto M.D.   On: 12/18/2018 15:31        Scheduled Meds: . docusate sodium  100 mg Oral BID  . enoxaparin (LOVENOX) injection  30 mg Subcutaneous Q24H  . insulin aspart  0-5 Units Subcutaneous QHS  . insulin aspart  0-9 Units Subcutaneous TID WC  . insulin glargine  15 Units Subcutaneous QHS   Continuous Infusions:   LOS: 0 days    Time spent: 35 Minutes.    Dana Allan, MD  [facility] Pager #: [phone] 7PM-7AM contact night coverage as above

## 2018-12-18 NOTE — Evaluation (Signed)
Physical Therapy Evaluation Patient Details Name: Brandi Melton MRN: [id number] DOB: 1930-10-12 Today's Date: 12/18/2018   History of Present Illness  85 y.o. female with medical history significant of DM, OSA not on CPAP, RA, Bell's palsy, morbid obesity (BMI 37), HLD, stage 3 CKD, and chronic diastolic CHF presenting after a fall where she was found in her recliner in an awkward position.  Clinical Impression  Pt admitted with above diagnosis.  Pt currently with functional limitations due to the deficits listed below (see PT Problem List). Pt will benefit from skilled PT to increase their independence and safety with mobility to allow discharge to the venue listed below.  Pt with intermittent confusion and noted weakness with mobility.  Do not feel she is safe to d/c to her [place] at current level.  Recommend SNF.     Follow Up Recommendations SNF    Equipment Recommendations  None recommended by PT    Recommendations for Other Services       Precautions / Restrictions Precautions Precautions: Fall Restrictions Weight Bearing Restrictions: No      Mobility  Bed Mobility Overal bed mobility: Needs Assistance Bed Mobility: Supine to Sit     Supine to sit: Mod assist;+2 for physical assistance     General bed mobility comments: Pt initating transfer to EOB, but needed MOD A of 2 for LE and trunk. Pt normally sleeps in recliner.  Transfers Overall transfer level: Needs assistance Equipment used: Rolling walker (2 wheeled) Transfers: Sit to/from [facility] Sit to Stand: Mod assist Stand pivot transfers: Mod assist;+2 safety/equipment       General transfer comment: Pt with tendency to keep wieght on heels and "flat feet" with little dorsiflexion with steps during SPT.  Ambulation/Gait             General Gait Details: SPT with RW and MOD A with posterior tendency. Fatigued from SPT.  Stairs            Wheelchair  Mobility    Modified Rankin (Stroke Patients Only)       Balance Overall balance assessment: History of Falls;Needs assistance   Sitting balance-Leahy Scale: Fair       Standing balance-Leahy Scale: Poor Standing balance comment: required UE support                             Pertinent Vitals/Pain Pain Assessment: Faces Faces Pain Scale: Hurts a little bit Pain Location: groin Pain Descriptors / Indicators: Grimacing Pain Intervention(s): Monitored during session    Home Living Family/patient expects to be discharged to:: Assisted living(Independent living at [facility]) Living Arrangements: Alone Available Help at Discharge: Personal care attendant;Available PRN/intermittently Type of Home: Apartment       Home Layout: One level Home Equipment: Walker - 4 wheels;Shower seat;Grab bars - tub/shower Additional Comments: [facility]    Prior Function Level of Independence: Needs assistance   Gait / Transfers Assistance Needed: Uses a rollator for functional mobility  ADL's / Homemaking Assistance Needed: Aide who comes in the morning to assist with bathing and dressing. [facility] prepares meals and brings them to pt's apartment        Hand Dominance   Dominant Hand: Right    Extremity/Trunk Assessment   Upper Extremity Assessment Upper Extremity Assessment: Defer to OT evaluation    Lower Extremity Assessment Lower Extremity Assessment: Generalized weakness;Overall [facility] for tasks assessed  Communication   Communication: No difficulties  Cognition Arousal/Alertness: Awake/alert Behavior During Therapy: WFL for tasks assessed/performed Overall Cognitive Status: Impaired/Different from baseline Area of Impairment: Memory;Safety/judgement;Awareness;Orientation;Following commands                 Orientation Level: Disoriented to;Time   Memory: Decreased recall of precautions;Decreased short-term memory Following  Commands: Follows one step commands consistently;Follows one step commands with increased time Safety/Judgement: Decreased awareness of safety;Decreased awareness of deficits Awareness: Intellectual   General Comments: Pt aware she was in the hospital, but focused on taking sheets to the laundry. Tearful at times.      General Comments General comments (skin integrity, edema, etc.): Pt transferred to recliner and sheets changed and then transferred back to bed. Pt falling asleep quickly after return to bed, with lab tech entering.    Exercises     Assessment/Plan    PT Assessment Patient needs continued PT services  PT Problem List Decreased strength;Decreased activity tolerance;Decreased balance;Decreased mobility;Decreased knowledge of use of DME       PT Treatment Interventions DME instruction;Gait training;Functional mobility training;Therapeutic activities;Therapeutic exercise;Balance training;Neuromuscular re-education;Patient/family education    PT Goals (Current goals can be found in the Care Plan section)  Acute Rehab PT Goals Patient Stated Goal: go home PT Goal Formulation: Patient unable to participate in goal setting Time For Goal Achievement: 01/01/19 Potential to Achieve Goals: Good    Frequency Min 2X/week   Barriers to discharge        Co-evaluation PT/OT/SLP Co-Evaluation/Treatment: Yes Reason for Co-Treatment: For patient/therapist safety;To address functional/ADL transfers PT goals addressed during session: Mobility/safety with mobility         AM-PAC PT "6 Clicks" Mobility  Outcome Measure Help needed turning from your back to your side while in a flat bed without using bedrails?: A Lot Help needed moving from lying on your back to sitting on the side of a flat bed without using bedrails?: A Lot Help needed moving to and from a bed to a chair (including a wheelchair)?: A Lot Help needed standing up from a chair using your arms (e.g., wheelchair or  bedside chair)?: A Little Help needed to walk in hospital room?: A Lot Help needed climbing 3-5 steps with a railing? : A Lot 6 Click Score: 13    End of Session Equipment Utilized During Treatment: Gait belt Activity Tolerance: Patient limited by fatigue Patient left: in bed;with call bell/phone within reach;with bed alarm set Nurse Communication: Other (comment)(MD) PT Visit Diagnosis: Unsteadiness on feet (R26.81);Muscle weakness (generalized) (M62.81);Difficulty in walking, not elsewhere classified (R26.2)    Time: [id number] PT Time Calculation (min) (ACUTE ONLY): 37 min   Charges:   PT Evaluation $PT Eval Moderate Complexity: 1 Mod          Janashia Parco L. Tamala Julian, [place] Pager [phone] 12/18/2018   Galen Manila 12/18/2018, 3:24 PM

## 2018-12-18 NOTE — Evaluation (Signed)
Occupational Therapy Evaluation Patient Details Name: Brandi Melton MRN: [id number] DOB: 1930-11-11 Today's Date: 12/18/2018    History of Present Illness 85 y.o. female with medical history significant of DM, OSA not on CPAP, RA, Bell's palsy, morbid obesity (BMI 37), HLD, stage 3 CKD, and chronic diastolic CHF presenting after a fall where she was found in her recliner in an awkward position.   Clinical Impression   Pt admitted with above diagnoses, with generalized weakness, poor activity tolerance, and decreased cognition limiting ability to engage in BADL at desired level of ind. PTA pt living at [facility] with aides coming in to assist with BADL twice a day. She has a history of falls. At time of evaluation she is mod A +2 for bed mobility and stand pivot transfers with RW. Pt presenting with cognitive deficits in orientation and memory, appears confused. She is very perseverative at times, and at one point talking about her mother in present times. At this time recommending SNF at d/c for improved engagement in BADL prior to returning to [facility]. Will continue to follow per POC listed below.     Follow Up Recommendations  SNF;Supervision/Assistance - 24 hour    Equipment Recommendations  Other (comment)(defer to next venue)    Recommendations for Other Services       Precautions / Restrictions Precautions Precautions: Fall Restrictions Weight Bearing Restrictions: No      Mobility Bed Mobility Overal bed mobility: Needs Assistance Bed Mobility: Supine to Sit;Sit to Supine     Supine to sit: Mod assist;+2 for physical assistance;+2 for safety/equipment Sit to supine: Mod assist;+2 for physical assistance;+2 for safety/equipment   General bed mobility comments: mod A +2 for LE translation off/on bed and assist at trunk. Pt sleeps in recliner at baseline  Transfers Overall transfer level: Needs assistance Equipment used: Rolling walker (2 wheeled) Transfers: Sit to/from  [facility] Sit to Stand: Mod assist Stand pivot transfers: Mod assist;+2 safety/equipment       General transfer comment: mod A +2 for safety and control of RW    Balance Overall balance assessment: History of Falls;Needs assistance Sitting-balance support: Bilateral upper extremity supported;Feet supported Sitting balance-Leahy Scale: Fair Sitting balance - Comments: to sit EOB and comb hair   Standing balance support: Bilateral upper extremity supported;During functional activity Standing balance-Leahy Scale: Poor Standing balance comment: reliant on BUE support and staff assist                           ADL either performed or assessed with clinical judgement   ADL Overall ADL's : Needs assistance/impaired Eating/Feeding: Set up;Sitting;Bed level   Grooming: Set up;Sitting;Bed level   Upper Body Bathing: Maximal assistance;Sitting   Lower Body Bathing: Total assistance;Sit to/from stand;Sitting/lateral leans   Upper Body Dressing : Moderate assistance;Sitting   Lower Body Dressing: Total assistance;Sit to/from stand;Sitting/lateral leans Lower Body Dressing Details (indicate cue type and reason): to don socks, normally has assist with dressing at baseline Toilet Transfer: Moderate assistance;+2 for safety/equipment;BSC;Stand-pivot;RW Toilet Transfer Details (indicate cue type and reason): simulated with recliner Toileting- Clothing Manipulation and Hygiene: Total assistance;Bed level;Sitting/lateral lean;Sit to/from stand Toileting - Clothing Manipulation Details (indicate cue type and reason): total A for incontinence of urine in the bed     Functional mobility during ADLs: Moderate assistance;+2 for safety/equipment;Rolling walker General ADL Comments: pt ltd 2/2 cognition, decreased activity tolerance and generalized weakness     Vision Patient Visual Report: No  change from baseline       Perception     Praxis      Pertinent  Vitals/Pain Pain Assessment: Faces Faces Pain Scale: Hurts a little bit Pain Location: groin Pain Descriptors / Indicators: Grimacing Pain Intervention(s): Monitored during session;Repositioned     Hand Dominance Right   Extremity/Trunk Assessment Upper Extremity Assessment Upper Extremity Assessment: Generalized weakness   Lower Extremity Assessment Lower Extremity Assessment: Defer to PT evaluation       Communication Communication Communication: No difficulties   Cognition Arousal/Alertness: Awake/alert Behavior During Therapy: WFL for tasks assessed/performed Overall Cognitive Status: Impaired/Different from baseline Area of Impairment: Memory;Safety/judgement;Awareness;Orientation;Following commands;Problem solving                 Orientation Level: Disoriented to;Time;Situation   Memory: Decreased recall of precautions;Decreased short-term memory Following Commands: Follows one step commands consistently;Follows one step commands with increased time Safety/Judgement: Decreased awareness of safety;Decreased awareness of deficits Awareness: Intellectual Problem Solving: Slow processing General Comments: pt was initially unaware she was in the hospital or why, states the month is november. She is very perseverative on taking the sheets to the laundry. She is tearful and sad she cannot see her family   General Comments  Pt transferred to recliner and sheets changed and then transferred back to bed. Pt falling asleep quickly after return to bed, with lab tech entering.    Exercises     Shoulder Instructions      Home Living Family/patient expects to be discharged to:: Assisted living(ILF at [facility]) Living Arrangements: Alone Available Help at Discharge: Personal care attendant;Available PRN/intermittently Type of Home: Apartment       Home Layout: One level               Home Equipment: Walker - 4 wheels;Shower seat;Grab bars - tub/shower    Additional Comments: [facility]      Prior Functioning/Environment Level of Independence: Needs assistance  Gait / Transfers Assistance Needed: Uses a rollator for functional mobility ADL's / Homemaking Assistance Needed: Aide who comes in the morning to assist with bathing and dressing. [facility] prepares meals and brings them to pt's apartment            OT Problem List: Decreased strength;Decreased knowledge of use of DME or AE;Decreased knowledge of precautions;Obesity;Decreased activity tolerance;Decreased cognition;Impaired balance (sitting and/or standing);Pain      OT Treatment/Interventions: Self-care/ADL training;Therapeutic exercise;Patient/family education;Balance training;Energy conservation;Therapeutic activities;DME and/or AE instruction    OT Goals(Current goals can be found in the care plan section) Acute Rehab OT Goals Patient Stated Goal: go home OT Goal Formulation: With patient Time For Goal Achievement: 01/01/19 Potential to Achieve Goals: Good  OT Frequency: Min 2X/week   Barriers to D/C:            Co-evaluation PT/OT/SLP Co-Evaluation/Treatment: Yes Reason for Co-Treatment: For patient/therapist safety;To address functional/ADL transfers PT goals addressed during session: Mobility/safety with mobility OT goals addressed during session: ADL's and self-care      AM-PAC OT "6 Clicks" Daily Activity     Outcome Measure Help from another person eating meals?: None Help from another person taking care of personal grooming?: A Little Help from another person toileting, which includes using toliet, bedpan, or urinal?: Total Help from another person bathing (including washing, rinsing, drying)?: Total Help from another person to put on and taking off regular upper body clothing?: A Lot Help from another person to put on and taking off regular lower body clothing?: Total 6 Click  Score: 12   End of Session Equipment Utilized During  Treatment: Gait belt;Rolling walker Nurse Communication: Mobility status;Other (comment)(purewick)  Activity Tolerance: Patient tolerated treatment well Patient left: in bed;with call bell/phone within reach;with bed alarm set  OT Visit Diagnosis: Other abnormalities of gait and mobility (R26.89);Muscle weakness (generalized) (M62.81);History of falling (Z91.81);Unsteadiness on feet (R26.81);Other symptoms and signs involving cognitive function;Pain Pain - part of body: (groin, foot)                Time: [id number] OT Time Calculation (min): 38 min Charges:  OT General Charges $OT Visit: 1 Visit OT Evaluation $OT Eval Moderate Complexity: [place], MSOT, OTR/L [employer] OT/ Acute Relief OT WL Office: [phone]   Zenovia Jarred 12/18/2018, 3:46 PM

## 2018-12-19 LAB — CBC WITH DIFFERENTIAL/PLATELET
Abs Immature Granulocytes: 0.12 10*3/uL — ABNORMAL HIGH (ref 0.00–0.07)
Basophils Absolute: 0 10*3/uL (ref 0.0–0.1)
Basophils Relative: 0 %
Eosinophils Absolute: 0.3 10*3/uL (ref 0.0–0.5)
Eosinophils Relative: 3 %
HCT: 33.2 % — ABNORMAL LOW (ref 36.0–46.0)
Hemoglobin: 9.9 g/dL — ABNORMAL LOW (ref 12.0–15.0)
Immature Granulocytes: 1 %
Lymphocytes Relative: 28 %
Lymphs Abs: 2.7 10*3/uL (ref 0.7–4.0)
MCH: 29.8 pg (ref 26.0–34.0)
MCHC: 29.8 g/dL — ABNORMAL LOW (ref 30.0–36.0)
MCV: 100 fL (ref 80.0–100.0)
Monocytes Absolute: 1.1 10*3/uL — ABNORMAL HIGH (ref 0.1–1.0)
Monocytes Relative: 11 %
Neutro Abs: 5.3 10*3/uL (ref 1.7–7.7)
Neutrophils Relative %: 57 %
Platelets: 209 10*3/uL (ref 150–400)
RBC: 3.32 MIL/uL — ABNORMAL LOW (ref 3.87–5.11)
RDW: 14.8 % (ref 11.5–15.5)
WBC: 9.4 10*3/uL (ref 4.0–10.5)
nRBC: 0 % (ref 0.0–0.2)

## 2018-12-19 LAB — RENAL FUNCTION PANEL
Albumin: 2.3 g/dL — ABNORMAL LOW (ref 3.5–5.0)
Anion gap: 11 (ref 5–15)
BUN: 27 mg/dL — ABNORMAL HIGH (ref 8–23)
CO2: 24 mmol/L (ref 22–32)
Calcium: 8.1 mg/dL — ABNORMAL LOW (ref 8.9–10.3)
Chloride: 102 mmol/L (ref 98–111)
Creatinine, Ser: 1.28 mg/dL — ABNORMAL HIGH (ref 0.44–1.00)
GFR calc Af Amer: 44 mL/min — ABNORMAL LOW (ref >60)
GFR calc non Af Amer: 38 mL/min — ABNORMAL LOW (ref >60)
Glucose, Bld: 203 mg/dL — ABNORMAL HIGH (ref 70–99)
Phosphorus: 3 mg/dL (ref 2.5–4.6)
Potassium: 3.9 mmol/L (ref 3.5–5.1)
Sodium: 137 mmol/L (ref 135–145)

## 2018-12-19 LAB — GLUCOSE, CAPILLARY
Glucose-Capillary: 188 mg/dL — ABNORMAL HIGH (ref 70–99)
Glucose-Capillary: 589 mg/dL (ref 70–99)
Glucose-Capillary: 247 mg/dL — ABNORMAL HIGH (ref 70–99)
Glucose-Capillary: 227 mg/dL — ABNORMAL HIGH (ref 70–99)

## 2018-12-19 LAB — GLUCOSE, RANDOM: Glucose, Bld: 277 mg/dL — ABNORMAL HIGH (ref 70–99)

## 2018-12-19 LAB — MAGNESIUM: Magnesium: 2.4 mg/dL (ref 1.7–2.4)

## 2018-12-19 MED ORDER — ENOXAPARIN SODIUM 40 MG/0.4ML ~~LOC~~ SOLN
40.0000 mg | SUBCUTANEOUS | Status: DC
  Administered 2018-12-19 – 2018-12-20 (×2): 40 mg via SUBCUTANEOUS
  Filled 2018-12-19 (×2): qty 0.4

## 2018-12-19 NOTE — Progress Notes (Addendum)
Son in law called wanting update on pt. Son is wanting pt to go back to [facility] and receive PT/OT there. He is wanting  MD to call tomorrow to discuss plan about pt. His # is [phone] Me[place] Maw)

## 2018-12-19 NOTE — Progress Notes (Signed)
PROGRESS NOTE    Brandi Melton  [id number] DOB: 1930-07-24 DOA: 12/17/2018 PCP: Haywood Pao, MD  Outpatient Specialists:     Brief Narrative:  Brandi Melton is a 85 y.o. female with history of diastolic CHF, IDDM-2, CKD 3, nonobstructive CAD, HTN, RA, OSA not on CPAP and depression.  Patient was admitted with altered mental status, fever with T-max of 100.5 F and history of fall.  Patient remains significantly confused and, therefore, could not give significant history.  Urine culture has not grown any organisms.  Will check procalcitonin, chest x-ray and will culture patient's blood.  Patient was volume depleted on presentation.  Patient is currently being assessed by PT OT team.  We will continue work-up for for now.  We will continue to hydrate patient.  Further management will depend on hospital course.  Patient may have underlying dementing illness.  Patient's HbA1c is 10.6%.  12/19/2018: Patient seen.  No new complaints.  Patient continues to improve, but still thinks is November.  PT OT evaluation recommended skilled nursing facility placement.  Assessment & Plan:   Active Problems:   Fall at nursing home   Acute encephalopathy  Fall:  Patient fell and the assisted living facility (according to the patient) Mildly elevated CPK, now resolved (rhabdomyolysis) -Generalized weakness -CT head without contrast without acute finding. -PT/OT eval appreciated.  Skilled nursing facility recommended.   -Fall precaution  Acute encephalopathy, possibly metabolic versus combined metabolic and toxic:  Urine culture has not grown any organisms.   -Volume depletion has resolved significantly.   -Chest x-ray, blood culture, procalcitonin, repeat CPK -no source of infection noted, or, findings suggestive of infection. -Patient remains confused.   -Pursue skilled nursing facility placement once a bed is available if okay with the patient and patient's family.  Elevated liver enzymes:   -Suspect due to mild rhabdomyolysis.   -Repeat AST and ALT are within normal range.  IDDM-2:  -Uncontrolled.   -HbA1c was 10.6%.   -Continue subcutaneous Lantus to 30 units twice daily.   -Continue sliding scale insulin coverage.  CKD-3: Stable.    SIRS/Mild leukocytosis: Leukocytosis has resolved.  Leukocytosis is likely reactive.    Hypertension:  Stable.   Continue to monitor.  Chronic diastolic CHF:  -Echo in [id number] with EF of 55 to 60%, G1 DD. -Compensated. -Continue to hold home diuretics -Gentle hydration  -Daily weight and intake and output  Mild CAD:  LHC in 2015 revealed mild CAD. -No chest pain.    OSA -Nightly CPAP  History of rheumatoid arthritis:   Depression:  Stable.   DVT prophylaxis: Subcu Lovenox     Code Status: DNR Family Communication:  Disposition Plan: Likely skilled nursing facility.   Consults called: None  Consultants:   None  Procedures:   None  Antimicrobials:   None   Subjective: No new complaints.    Objective: Vitals:   12/18/18 2054 12/18/18 2056 12/18/18 2112 12/19/18 0616  BP: (!) 135/50   (!) 143/56  Pulse: 81 81 81 75  Resp: 18   20  Temp: 99 F (37.2 C)   98.6 F (37 C)  TempSrc: Oral   Oral  SpO2: (!) 89% (!) 89% 90% 94%  Weight:      Height:   5\' 5"  (1.651 m)     Intake/Output Summary (Last 24 hours) at 12/19/2018 1209 Last data filed at 12/19/2018 0200 Gross per 24 hour  Intake 910.6 ml  Output 650 ml  Net 260.6 ml  Filed Weights   12/18/18 0433  Weight: 102.6 kg    Examination:  General exam: Appears calm and comfortable.   Respiratory system: Clear to auscultation.  Cardiovascular system: S1 & S2  Gastrointestinal system: Abdomen is obese, soft and nontender.  Organs are difficult to assess.   Central nervous system: Awake and alert.  Patient remains confused patient was all extremities. Extremities: No leg edema.    Data Reviewed: I have personally reviewed following labs  and imaging studies  CBC: Recent Labs  Lab 12/17/18 0912 12/17/18 1648 12/18/18 0311 12/19/18 0502  WBC 12.3* 10.6* 8.4 9.4  NEUTROABS 7.8*  --   --  5.3  HGB 13.1 11.5* 10.4* 9.9*  HCT 43.3 37.8 35.6* 33.2*  MCV 98.2 98.4 101.1* 100.0  PLT 268 245 207 [id number]   Basic Metabolic Panel: Recent Labs  Lab 12/17/18 0912 12/17/18 1648 12/18/18 0311 12/19/18 0502  NA 136  --  136 137  K 4.0  --  4.2 3.9  CL 97*  --  105 102  CO2 27  --  24 24  GLUCOSE 78  --  197* 203*  BUN 41*  --  35* 27*  CREATININE 1.55* 1.34* 1.41* 1.28*  CALCIUM 9.0  --  7.8* 8.1*  MG  --   --   --  2.4  PHOS  --   --   --  3.0   GFR: Estimated Creatinine Clearance: 37.5 mL/min (A) (by C-G formula based on SCr of 1.28 mg/dL (H)). Liver Function Tests: Recent Labs  Lab 12/17/18 0912 12/18/18 0311 12/19/18 0502  AST 54* 34  --   ALT 45* 27  --   ALKPHOS 130* 92  --   BILITOT 0.5 0.6  --   PROT 8.4* 5.8*  --   ALBUMIN 3.6 2.5* 2.3*   No results for input(s): LIPASE, AMYLASE in the last 168 hours. No results for input(s): AMMONIA in the last 168 hours. Coagulation Profile: No results for input(s): INR, PROTIME in the last 168 hours. Cardiac Enzymes: Recent Labs  Lab 12/17/18 0912 12/18/18 1441  CKTOTAL 347* 161   BNP (last 3 results) No results for input(s): PROBNP in the last 8760 hours. HbA1C: Recent Labs    12/17/18 1648  HGBA1C 10.6*   CBG: Recent Labs  Lab 12/18/18 0920 12/18/18 1150 12/18/18 1635 12/18/18 2111 12/19/18 0726  GLUCAP 191* 205* 330* 260* 188*   Lipid Profile: No results for input(s): CHOL, HDL, LDLCALC, TRIG, CHOLHDL, LDLDIRECT in the last 72 hours. Thyroid Function Tests: Recent Labs    12/17/18 1648  TSH 1.071   Anemia Panel: No results for input(s): VITAMINB12, FOLATE, FERRITIN, TIBC, IRON, RETICCTPCT in the last 72 hours. Urine analysis:    Component Value Date/Time   COLORURINE YELLOW 12/17/2018 0849   APPEARANCEUR CLEAR 12/17/2018 0849    APPEARANCEUR Cloudy (A) 08/12/2016 1312   LABSPEC 1.013 12/17/2018 0849   PHURINE 6.0 12/17/2018 0849   GLUCOSEU NEGATIVE 12/17/2018 0849   HGBUR MODERATE (A) 12/17/2018 0849   BILIRUBINUR NEGATIVE 12/17/2018 0849   BILIRUBINUR Negative 08/12/2016 1312   KETONESUR NEGATIVE 12/17/2018 0849   PROTEINUR NEGATIVE 12/17/2018 0849   NITRITE NEGATIVE 12/17/2018 0849   LEUKOCYTESUR MODERATE (A) 12/17/2018 0849   Sepsis Labs: @LABRCNTIP (procalcitonin:4,lacticidven:4)  ) Recent Results (from the past 240 hour(s))  SARS Coronavirus 2 [facility] order, Performed in [facility] hospital lab) Nasopharyngeal Nasopharyngeal Swab     Status: None   Collection Time: 12/17/18  8:50 AM  Specimen: Nasopharyngeal Swab  Result Value Ref Range Status   SARS Coronavirus 2 NEGATIVE NEGATIVE Final    Comment: (NOTE) If result is NEGATIVE SARS-CoV-2 target nucleic acids are NOT DETECTED. The SARS-CoV-2 RNA is generally detectable in upper and lower  respiratory specimens during the acute phase of infection. The lowest  concentration of SARS-CoV-2 viral copies this assay can detect is 250  copies / mL. A negative result does not preclude SARS-CoV-2 infection  and should not be used as the sole basis for treatment or other  patient management decisions.  A negative result may occur with  improper specimen collection / handling, submission of specimen other  than nasopharyngeal swab, presence of viral mutation(s) within the  areas targeted by this assay, and inadequate number of viral copies  (<250 copies / mL). A negative result must be combined with clinical  observations, patient history, and epidemiological information. If result is POSITIVE SARS-CoV-2 target nucleic acids are DETECTED. The SARS-CoV-2 RNA is generally detectable in upper and lower  respiratory specimens dur ing the acute phase of infection.  Positive  results are indicative of active infection with SARS-CoV-2.  Clinical  correlation  with patient history and other diagnostic information is  necessary to determine patient infection status.  Positive results do  not rule out bacterial infection or co-infection with other viruses. If result is PRESUMPTIVE POSTIVE SARS-CoV-2 nucleic acids MAY BE PRESENT.   A presumptive positive result was obtained on the submitted specimen  and confirmed on repeat testing.  While 2019 novel coronavirus  (SARS-CoV-2) nucleic acids may be present in the submitted sample  additional confirmatory testing may be necessary for epidemiological  and / or clinical management purposes  to differentiate between  SARS-CoV-2 and other Sarbecovirus currently known to infect humans.  If clinically indicated additional testing with an alternate test  methodology [phone]) is advised. The SARS-CoV-2 RNA is generally  detectable in upper and lower respiratory sp ecimens during the acute  phase of infection. The expected result is Negative. Fact Sheet for Patients:  StrictlyIdeas.no Fact Sheet for Healthcare Providers: BankingDealers.co.za This test is not yet approved or cleared by the [place] FDA and has been authorized for detection and/or diagnosis of SARS-CoV-2 by FDA under an Emergency Use Authorization (EUA).  This EUA will remain in effect (meaning this test can be used) for the duration of the COVID-19 declaration under Section 564(b)(1) of the Act, 21 U.S.C. section 360bbb-3(b)(1), unless the authorization is terminated or revoked sooner. Performed at [facility], [place] [address], [place], [place] [address]   Urine culture     Status: None   Collection Time: 12/17/18  9:57 AM   Specimen: Urine, Catheterized  Result Value Ref Range Status   Specimen Description   Final    URINE, CATHETERIZED Performed at [facility] [address] [place], [place] [address]    Special Requests   Final    NONE  Performed at [facility], [place] [address], [place], [place] [address]    Culture   Final    NO GROWTH Performed at [facility], [place] [address] [place] [address], [place], [place] [address]    Report Status 12/18/2018 FINAL  Final  Culture, blood (routine x 2)     Status: None (Preliminary result)   Collection Time: 12/18/18  2:41 PM   Specimen: BLOOD RIGHT HAND  Result Value Ref Range Status   Specimen Description   Final    BLOOD  RIGHT HAND Performed at [facility], [place] [address] [place], [place] [address]    Special Requests   Final    BOTTLES DRAWN AEROBIC ONLY Blood Culture adequate volume Performed at [facility] [address], [place], [place] [address]    Culture   Final    NO GROWTH < 24 HOURS Performed at [facility] [address] [place], [place] [address]    Report Status PENDING  Incomplete  Culture, blood (routine x 2)     Status: None (Preliminary result)   Collection Time: 12/18/18  2:41 PM   Specimen: Left Antecubital; Blood  Result Value Ref Range Status   Specimen Description   Final    LEFT ANTECUBITAL Performed at [facility] [address], [place], [place] [address]    Special Requests   Final    BOTTLES DRAWN AEROBIC ONLY Blood Culture adequate volume Performed at [facility] [address], [place], [place] [address]    Culture   Final    NO GROWTH < 24 HOURS Performed at [facility] [address] [place],  [address]    Report Status PENDING  Incomplete         Radiology Studies: Dg Chest Port 1 View  Result Date: 12/18/2018 CLINICAL DATA:  Acute encephalopathy. Pt presented to ER yesterday with c/o AMS. EXAM: PORTABLE CHEST 1 VIEW COMPARISON:  Chest radiograph 04/09/2017, 04/06/2017, 11/08/2015 FINDINGS: Stable cardiomediastinal contours with mildly enlarged heart size. Low volume study. Mild linear opacities at the left  lung base likely reflect atelectasis or scarring. No pneumothorax or pleural effusion. No acute osseous abnormality in visualized portions of the skeleton. IMPRESSION: Mild opacities at the left base favored to represent atelectasis or scarring, less likely developing infiltrate. Electronically Signed   By: Audie Pinto M.D.   On: 12/18/2018 15:31        Scheduled Meds: . docusate sodium  100 mg Oral BID  . enoxaparin (LOVENOX) injection  30 mg Subcutaneous Q24H  . insulin aspart  0-5 Units Subcutaneous QHS  . insulin aspart  0-9 Units Subcutaneous TID WC  . insulin glargine  30 Units Subcutaneous BID   Continuous Infusions:   LOS: 1 day    Time spent: 25 Minutes.    Dana Allan, MD  [facility] Pager #: [phone] 7PM-7AM contact night coverage as above

## 2018-12-19 NOTE — Progress Notes (Signed)
Pt CBG checked and was 589. RN ordered a stat glucose lab draw per protocol. The lab glucose was 277 and pt was covered based on this finding with 5 units of novolog. Dr. Marthenia Rolling was notified via amnion.

## 2018-12-19 NOTE — Progress Notes (Signed)
Patient does not want to wear CPAP at night per RN

## 2018-12-19 NOTE — Progress Notes (Signed)
Pharmacy LMWH dose adjustment LMWH 30>>40 mg q24  Eudelia Bunch, Pharm.D [phone] 12/19/2018 3:11 PM

## 2018-12-20 DIAGNOSIS — N390 Urinary tract infection, site not specified: Secondary | ICD-10-CM

## 2018-12-20 DIAGNOSIS — G9341 Metabolic encephalopathy: Secondary | ICD-10-CM

## 2018-12-20 DIAGNOSIS — L8990 Pressure ulcer of unspecified site, unspecified stage: Secondary | ICD-10-CM | POA: Insufficient documentation

## 2018-12-20 LAB — GLUCOSE, CAPILLARY
Glucose-Capillary: 160 mg/dL — ABNORMAL HIGH (ref 70–99)
Glucose-Capillary: 217 mg/dL — ABNORMAL HIGH (ref 70–99)
Glucose-Capillary: 176 mg/dL — ABNORMAL HIGH (ref 70–99)
Glucose-Capillary: 108 mg/dL — ABNORMAL HIGH (ref 70–99)

## 2018-12-20 NOTE — TOC Initial Note (Signed)
Transition of Care [facility]) - Initial/Assessment Note    Patient Details  Name: Brandi Melton Date of Birth: 05/26/1930  Transition of Care [facility]) CM/SW Contact:    Leeroy Cha, RN Phone Number: 12/20/2018, 9:37 AM  Clinical Narrative:                 fl2 sent out to various snf's in the [place] and surrounding areas.  Expected Discharge Plan: Skilled Nursing Facility Barriers to Discharge: Insurance Authorization   Patient Goals and CMS Choice     Choice offered to / list presented to : Patient  Expected Discharge Plan and Services Expected Discharge Plan: [place]   Discharge Planning Services: CM Consult                                          Prior Living Arrangements/Services                       Activities of Daily Living Home Assistive Devices/Equipment: Built-in shower seat, Environmental consultant (specify type), Wheelchair ADL Screening (condition at time of admission) Patient's cognitive ability adequate to safely complete daily activities?: No Is the patient deaf or have difficulty hearing?: No Does the patient have difficulty seeing, even when wearing glasses/contacts?: No Does the patient have difficulty concentrating, remembering, or making decisions?: No Patient able to express need for assistance with ADLs?: Yes Does the patient have difficulty dressing or bathing?: Yes Independently performs ADLs?: Yes (appropriate for developmental age) Does the patient have difficulty walking or climbing stairs?: No Weakness of Legs: None Weakness of Arms/Hands: None  Permission Sought/Granted                  Emotional Assessment              Admission diagnosis:  Metabolic encephalopathy 99991111 Acute lower UTI [N39.0] Patient Active Problem List   Diagnosis Date Noted  . Pressure injury of skin 12/20/2018  . Acute encephalopathy 12/18/2018  . Fall at nursing home 12/17/2018  . Syncope 10/17/2018  . Fall  10/16/2018  . Gout 02/19/2016  . H/O cardiac catheterization   . Hypertensive heart disease with CHF (congestive heart failure) ([place])   . Hypoglycemia due to insulin   . CKD (chronic kidney disease), stage III ([place])   . Chronic diastolic CHF (congestive heart failure) ([place])   . Morbid obesity ([place])   . Hypertension   . Hyperlipemia   . Sleep apnea   . Palsy, Bell's   . Diabetes mellitus type 2, insulin dependent ([place]) 08/10/2014  . Distal radius fracture 08/08/2014   PCP:  Haywood Pao, MD Pharmacy:   [facility] [place], [address] [place] [address] Phone: [phone] Fax: [phone]  [place], [place] [facility] [address] [place] [address] [place] [address] Phone: [phone] Fax: [phone]     Social Determinants of Health ([place]) Interventions    Readmission Risk Interventions No flowsheet data found.

## 2018-12-20 NOTE — Progress Notes (Signed)
PT Cancellation Note  Patient Details Name: Brandi Melton MRN: [id number] DOB: Oct 01, 1930   Cancelled Treatment:    Reason Eval/Treat Not Completed: Patient declined. Pt very upset saying " I have been here 3 days and I want to go home.".  PT explained that working with PT and getting OOB and ambulating would help get her out of the hospital.  Pt stated, " I don't care what the doctor says and I don't want Physical Therapy, I want to go home." Will check back as schedule permits.  Galen Manila 12/20/2018, 10:00 AM

## 2018-12-20 NOTE — Progress Notes (Signed)
PROGRESS NOTE    Brandi Melton  [id number] DOB: May 30, 1930 DOA: 12/17/2018 PCP: Haywood Pao, MD  Brief Narrative:Brandi Melton a 85 y.o.femalewith history ofdiastolic CHF, IDDM-2, CKD 3, nonobstructive CAD, HTN, RA, OSAnot on CPAP anddepression.  Patient was admitted with altered mental status, fever with T-max of 100.5 F and history of fall.  Patient remains significantly confused and, therefore, could not give significant history.  Urine culture has not grown any organisms.  Will check procalcitonin, chest x-ray and will culture patient's blood.  Patient was volume depleted on presentation.  Patient is currently being assessed by PT OT team.  We will continue work-up for for now.  We will continue to hydrate patient.  Further management will depend on hospital course.  Patient may have underlying dementing illness.  Patient's HbA1c is 10.6%.  Assessment & Plan:   Active Problems:   Fall at nursing home   Acute encephalopathy   Pressure injury of skin  Fall:  Patient fell at the assisted living facility  Mildly  elevated CPK, now resolved (rhabdomyolysis) -CT head without contrast without acute finding. -PT/OT eval appreciated.  Skilled nursing facility recommended.   -Fall precaution  Acute encephalopathy, possibly metabolic versus combined metabolic and toxic:  Urine culture has not grown any organisms.   -Volume depletion has resolved significantly.   -Chest x-ray, blood culture, procalcitonin, repeat CPK -no source of infection noted, or, findings suggestive of infection. -Patient remains confused.   -PT recommends SNF patient came from [place] who does not have a SNF wing await SNF bed.  Elevated liver enzymes:  -Suspect due to mild rhabdomyolysis.   -Repeat AST and ALT are within normal range.  IDDM-2:  -Uncontrolled.   -HbA1c was 10.6%.   -Continue subcutaneous Lantus to 30 units twice daily.   -Continue sliding scale insulin coverage.  CKD-3:  Stable.    SIRS/Mild leukocytosis: Leukocytosis has resolved.  Leukocytosis is likely reactive.    Hypertension:  Stable.   Continue to monitor.  Chronic diastolic CHF:  -Echo in 99991111 with EF of 55 to 60%, G1 DD. -Compensated. -Continue to hold home diuretics -Gentle hydration  -Daily weight and intake and output  Mild CAD: LHC in 2015 revealed mild CAD. -No chest pain.    OSA -Nightly CPAP  History of rheumatoid arthritis:Stable  Depression:  Stable.   DVT prophylaxis:Subcu Lovenox Code Status:DNR Family Communication:Discussed with patient's son Delfino Lovett at [id number].  He asked me to call his wife tomorrow at [phone] with the plan of discharge.  Her son is a Buyer, retail and will be in surgery all day tomorrow. Disposition Plan: skilled nursing facility.   Consults called:None  Consultants:   None  Procedures:   None  Antimicrobials:   None  Pressure Injury 12/17/18 Sacrum Mid Stage II -  Partial thickness loss of dermis presenting as a shallow open ulcer with a red, pink wound bed without slough. (Active)  12/17/18 0850  Location: Sacrum  Location Orientation: Mid  Staging: Stage II -  Partial thickness loss of dermis presenting as a shallow open ulcer with a red, pink wound bed without slough.  Wound Description (Comments):   Present on Admission: Yes        Estimated body mass index is 37.05 kg/m as calculated from the following:   Height as of this encounter: 5\' 5"  (1.651 m).   Weight as of this encounter: 101 kg.    Subjective: Patient is resting in bed very anxious and wanting to  be discharged now back to [facility]  Objective: Vitals:   12/19/18 1459 12/19/18 2131 12/20/18 0502 12/20/18 0616  BP: (!) 137/51 (!) 140/56 (!) 151/51   Pulse: 71 72 72   Resp: 16 20 18    Temp: 98 F (36.7 C) 98.1 F (36.7 C) 98.2 F (36.8 C)   TempSrc: Oral Oral Oral   SpO2: 97% 100% 97%   Weight:    101 kg  Height:         Intake/Output Summary (Last 24 hours) at 12/20/2018 1406 Last data filed at 12/20/2018 0850 Gross per 24 hour  Intake 640 ml  Output 1400 ml  Net -760 ml   Filed Weights   12/18/18 0433 12/20/18 0616  Weight: 102.6 kg 101 kg    Examination:  General exam: Appears calm and comfortable  Respiratory system: Clear to auscultation. Respiratory effort normal. Cardiovascular system: S1 & S2 heard, RRR. No JVD, murmurs, rubs, gallops or clicks. No pedal edema. Gastrointestinal system: Abdomen is nondistended, soft and nontender. No organomegaly or masses felt. Normal bowel sounds heard. Central nervous system: Alert and oriented. No focal neurological deficits. Extremities: Trace lower extremity edema. Skin: No rashes, lesions or ulcers Psychiatry: Judgement and insight appear normal. Mood & affect appropriate.     Data Reviewed: I have personally reviewed following labs and imaging studies  CBC: Recent Labs  Lab 12/17/18 0912 12/17/18 1648 12/18/18 0311 12/19/18 0502  WBC 12.3* 10.6* 8.4 9.4  NEUTROABS 7.8*  --   --  5.3  HGB 13.1 11.5* 10.4* 9.9*  HCT 43.3 37.8 35.6* 33.2*  MCV 98.2 98.4 101.1* 100.0  PLT 268 245 207 [id number]   Basic Metabolic Panel: Recent Labs  Lab 12/17/18 0912 12/17/18 1648 12/18/18 0311 12/19/18 0502 12/19/18 1322  NA 136  --  136 137  --   K 4.0  --  4.2 3.9  --   CL 97*  --  105 102  --   CO2 27  --  24 24  --   GLUCOSE 78  --  197* 203* 277*  BUN 41*  --  35* 27*  --   CREATININE 1.55* 1.34* 1.41* 1.28*  --   CALCIUM 9.0  --  7.8* 8.1*  --   MG  --   --   --  2.4  --   PHOS  --   --   --  3.0  --    GFR: Estimated Creatinine Clearance: 37.2 mL/min (A) (by C-G formula based on SCr of 1.28 mg/dL (H)). Liver Function Tests: Recent Labs  Lab 12/17/18 0912 12/18/18 0311 12/19/18 0502  AST 54* 34  --   ALT 45* 27  --   ALKPHOS 130* 92  --   BILITOT 0.5 0.6  --   PROT 8.4* 5.8*  --   ALBUMIN 3.6 2.5* 2.3*   No results for input(s):  LIPASE, AMYLASE in the last 168 hours. No results for input(s): AMMONIA in the last 168 hours. Coagulation Profile: No results for input(s): INR, PROTIME in the last 168 hours. Cardiac Enzymes: Recent Labs  Lab 12/17/18 0912 12/18/18 1441  CKTOTAL 347* 161   BNP (last 3 results) No results for input(s): PROBNP in the last 8760 hours. HbA1C: Recent Labs    12/17/18 1648  HGBA1C 10.6*   CBG: Recent Labs  Lab 12/19/18 1205 12/19/18 1648 12/19/18 2142 12/20/18 0752 12/20/18 1152  GLUCAP 589* 247* 227* 160* 217*   Lipid Profile: No results for input(s): CHOL,  HDL, LDLCALC, TRIG, CHOLHDL, LDLDIRECT in the last 72 hours. Thyroid Function Tests: Recent Labs    12/17/18 1648  TSH 1.071   Anemia Panel: No results for input(s): VITAMINB12, FOLATE, FERRITIN, TIBC, IRON, RETICCTPCT in the last 72 hours. Sepsis Labs: Recent Labs  Lab 12/18/18 1441  PROCALCITON <0.10    Recent Results (from the past 240 hour(s))  SARS Coronavirus 2 [facility] order, Performed in [facility] hospital lab) Nasopharyngeal Nasopharyngeal Swab     Status: None   Collection Time: 12/17/18  8:50 AM   Specimen: Nasopharyngeal Swab  Result Value Ref Range Status   SARS Coronavirus 2 NEGATIVE NEGATIVE Final    Comment: (NOTE) If result is NEGATIVE SARS-CoV-2 target nucleic acids are NOT DETECTED. The SARS-CoV-2 RNA is generally detectable in upper and lower  respiratory specimens during the acute phase of infection. The lowest  concentration of SARS-CoV-2 viral copies this assay can detect is 250  copies / mL. A negative result does not preclude SARS-CoV-2 infection  and should not be used as the sole basis for treatment or other  patient management decisions.  A negative result may occur with  improper specimen collection / handling, submission of specimen other  than nasopharyngeal swab, presence of viral mutation(s) within the  areas targeted by this assay, and inadequate number of viral  copies  (<250 copies / mL). A negative result must be combined with clinical  observations, patient history, and epidemiological information. If result is POSITIVE SARS-CoV-2 target nucleic acids are DETECTED. The SARS-CoV-2 RNA is generally detectable in upper and lower  respiratory specimens dur ing the acute phase of infection.  Positive  results are indicative of active infection with SARS-CoV-2.  Clinical  correlation with patient history and other diagnostic information is  necessary to determine patient infection status.  Positive results do  not rule out bacterial infection or co-infection with other viruses. If result is PRESUMPTIVE POSTIVE SARS-CoV-2 nucleic acids MAY BE PRESENT.   A presumptive positive result was obtained on the submitted specimen  and confirmed on repeat testing.  While 2019 novel coronavirus  (SARS-CoV-2) nucleic acids may be present in the submitted sample  additional confirmatory testing may be necessary for epidemiological  and / or clinical management purposes  to differentiate between  SARS-CoV-2 and other Sarbecovirus currently known to infect humans.  If clinically indicated additional testing with an alternate test  methodology [phone]) is advised. The SARS-CoV-2 RNA is generally  detectable in upper and lower respiratory sp ecimens during the acute  phase of infection. The expected result is Negative. Fact Sheet for Patients:  StrictlyIdeas.no Fact Sheet for Healthcare Providers: BankingDealers.co.za This test is not yet approved or cleared by the [place] FDA and has been authorized for detection and/or diagnosis of SARS-CoV-2 by FDA under an Emergency Use Authorization (EUA).  This EUA will remain in effect (meaning this test can be used) for the duration of the COVID-19 declaration under Section 564(b)(1) of the Act, 21 U.S.C. section 360bbb-3(b)(1), unless the authorization is terminated  or revoked sooner. Performed at [facility], [place] [address] [place], [place] [address]   Urine culture     Status: None   Collection Time: 12/17/18  9:57 AM   Specimen: Urine, Catheterized  Result Value Ref Range Status   Specimen Description   Final    URINE, CATHETERIZED Performed at [facility] [address], [place], [place] [address]    Special Requests   Final  NONE Performed at [facility], [place] [address], [place], [place] [address]    Culture   Final    NO GROWTH Performed at [facility], [place] [address] [place], [place] [address]    Report Status 12/18/2018 FINAL  Final  Culture, blood (routine x 2)     Status: None (Preliminary result)   Collection Time: 12/18/18  2:41 PM   Specimen: BLOOD RIGHT HAND  Result Value Ref Range Status   Specimen Description   Final    BLOOD RIGHT HAND Performed at [facility] [address] [place], [place] [address]    Special Requests   Final    BOTTLES DRAWN AEROBIC ONLY Blood Culture adequate volume Performed at [facility] [address] [place], [place] [address]    Culture   Final    NO GROWTH 2 DAYS Performed at [facility] [address], [place], [place] [address]    Report Status PENDING  Incomplete  Culture, blood (routine x 2)     Status: None (Preliminary result)   Collection Time: 12/18/18  2:41 PM   Specimen: BLOOD  Result Value Ref Range Status   Specimen Description   Final    BLOOD LEFT ANTECUBITAL Performed at [facility], [place] [address], [place], [place] [address]    Special Requests   Final    BOTTLES DRAWN AEROBIC ONLY Blood Culture adequate volume Performed at [facility] [address] [place], [place] [address]    Culture   Final    NO GROWTH 2 DAYS Performed at [facility] [address], [place], [place] [address]    Report Status  PENDING  Incomplete         Radiology Studies: Dg Chest Port 1 View  Result Date: 12/18/2018 CLINICAL DATA:  Acute encephalopathy. Pt presented to ER yesterday with c/o AMS. EXAM: PORTABLE CHEST 1 VIEW COMPARISON:  Chest radiograph 04/09/2017, 04/06/2017, 11/08/2015 FINDINGS: Stable cardiomediastinal contours with mildly enlarged heart size. Low volume study. Mild linear opacities at the left lung base likely reflect atelectasis or scarring. No pneumothorax or pleural effusion. No acute osseous abnormality in visualized portions of the skeleton. IMPRESSION: Mild opacities at the left base favored to represent atelectasis or scarring, less likely developing infiltrate. Electronically Signed   By: Audie Pinto M.D.   On: 12/18/2018 15:31        Scheduled Meds: . docusate sodium  100 mg Oral BID  . enoxaparin (LOVENOX) injection  40 mg Subcutaneous Q24H  . insulin aspart  0-5 Units Subcutaneous QHS  . insulin aspart  0-9 Units Subcutaneous TID WC  . insulin glargine  30 Units Subcutaneous BID   Continuous Infusions:   LOS: 2 days    Georgette Shell, MD [facility] If 7PM-7AM, please contact night-coverage [email or web address] Password [email or web address] 12/20/2018, 2:06 PM

## 2018-12-20 NOTE — Progress Notes (Signed)
Pt refused CPAP qhs.  Pt encouraged to contact RT should she change her mind.  

## 2018-12-20 NOTE — NC FL2 (Signed)
[place] LEVEL OF CARE SCREENING TOOL     IDENTIFICATION  Patient Name: Brandi Melton Birthdate: [birth year] Sex: female Admission Date (Current Location): 12/17/2018  [facility] [place] Number:  Herbalist and Address:         Provider Number: [id number]  Attending Physician Name and Address:  Georgette Shell, MD  Relative Name and Phone Number:       Current Level of Care: Hospital Recommended Level of Care: [place] Prior Approval Number:    Date Approved/Denied:   PASRR Number: pending  Discharge Plan: SNF    Current Diagnoses: Patient Active Problem List   Diagnosis Date Noted  . Pressure injury of skin 12/20/2018  . Acute encephalopathy 12/18/2018  . Fall at nursing home 12/17/2018  . Syncope 10/17/2018  . Fall 10/16/2018  . Gout 02/19/2016  . H/O cardiac catheterization   . Hypertensive heart disease with CHF (congestive heart failure) ([place])   . Hypoglycemia due to insulin   . CKD (chronic kidney disease), stage III ([place])   . Chronic diastolic CHF (congestive heart failure) ([place])   . Morbid obesity ([place])   . Hypertension   . Hyperlipemia   . Sleep apnea   . Palsy, Bell's   . Diabetes mellitus type 2, insulin dependent ([place]) 08/10/2014  . Distal radius fracture 08/08/2014    Orientation RESPIRATION BLADDER Height & Weight     Self, Time, Situation, Place  Normal Continent Weight: 101 kg Height:  5\' 5"  (165.1 cm)  BEHAVIORAL SYMPTOMS/MOOD NEUROLOGICAL BOWEL NUTRITION STATUS      Continent Diet(regular)  AMBULATORY STATUS COMMUNICATION OF NEEDS Skin   Extensive Assist(pt x5 weekly) Verbally Normal                       Personal Care Assistance Level of Assistance  Bathing, Feeding, Dressing Bathing Assistance: Limited assistance Feeding assistance: Limited assistance Dressing Assistance: Limited assistance     Functional Limitations Info             SPECIAL CARE FACTORS FREQUENCY                        Contractures Contractures Info: Not present    Additional Factors Info  Code Status Code Status Info: DNR             Current Medications (12/20/2018):  This is the current hospital active medication list Current Facility-Administered Medications  Medication Dose Route Frequency Provider Last Rate Last Dose  . acetaminophen (TYLENOL) tablet 650 mg  650 mg Oral Q6H PRN Mercy Riding, MD       Or  . acetaminophen (TYLENOL) suppository 650 mg  650 mg Rectal Q6H PRN Gonfa, Taye T, MD      . bisacodyl (DULCOLAX) EC tablet 5 mg  5 mg Oral Daily PRN Wendee Beavers T, MD      . docusate sodium (COLACE) capsule 100 mg  100 mg Oral BID Wendee Beavers T, MD   100 mg at 12/19/18 2156  . enoxaparin (LOVENOX) injection 40 mg  40 mg Subcutaneous Q24H Eudelia Bunch, RPH   40 mg at 12/19/18 2156  . insulin aspart (novoLOG) injection 0-5 Units  0-5 Units Subcutaneous QHS Mercy Riding, MD   2 Units at 12/19/18 2155  . insulin aspart (novoLOG) injection 0-9 Units  0-9 Units Subcutaneous TID WC Mercy Riding, MD   2 Units at 12/20/18 [phone]  .  insulin glargine (LANTUS) injection 30 Units  30 Units Subcutaneous BID Dana Allan I, MD   30 Units at 12/19/18 2156  . ondansetron (ZOFRAN) tablet 4 mg  4 mg Oral Q6H PRN Gonfa, Taye T, MD       Or  . ondansetron (ZOFRAN) injection 4 mg  4 mg Intravenous Q6H PRN Gonfa, Taye T, MD      . oxyCODONE (Oxy IR/ROXICODONE) immediate release tablet 5 mg  5 mg Oral Q4H PRN Gonfa, Taye T, MD      . senna-docusate (Senokot-S) tablet 1 tablet  1 tablet Oral QHS PRN Gonfa, Taye T, MD      . sodium phosphate (FLEET) 7-19 GM/118ML enema 1 enema  1 enema Rectal Once PRN Wendee Beavers T, MD      . traZODone (DESYREL) tablet 25 mg  25 mg Oral QHS PRN Mercy Riding, MD         Discharge Medications: Please see discharge summary for a list of discharge medications.  Relevant Imaging Results:  Relevant Lab Results:   Additional  Information [id number]  Leeroy Cha, RN

## 2018-12-21 LAB — VITAMIN D 25 HYDROXY (VIT D DEFICIENCY, FRACTURES): Vit D, 25-Hydroxy: 34.5 ng/mL (ref 30.0–100.0)

## 2018-12-21 LAB — GLUCOSE, CAPILLARY
Glucose-Capillary: 58 mg/dL — ABNORMAL LOW (ref 70–99)
Glucose-Capillary: 79 mg/dL (ref 70–99)
Glucose-Capillary: 173 mg/dL — ABNORMAL HIGH (ref 70–99)

## 2018-12-21 MED ORDER — DOCUSATE SODIUM 100 MG PO CAPS: 100.0000 mg | ORAL_CAPSULE | Freq: Two times a day (BID) | ORAL | 0 refills | Status: DC

## 2018-12-21 MED ORDER — SENNOSIDES-DOCUSATE SODIUM 8.6-50 MG PO TABS: 1.0000 | ORAL_TABLET | Freq: Every evening | ORAL | Status: AC | PRN

## 2018-12-21 MED ORDER — INSULIN GLARGINE 100 UNIT/ML ~~LOC~~ SOLN: 30.0000 [IU] | Freq: Two times a day (BID) | SUBCUTANEOUS | 11 refills | Status: DC

## 2018-12-21 NOTE — TOC Transition Note (Signed)
Transition of Care [facility]) - CM/SW Discharge Note   Patient Details  Name: Brandi Melton MRN: [id number] Date of Birth: 1930-05-14  [facility]) CM/SW Contact:  Lynnell Catalan, RN Phone Number: 12/21/2018, 12:26 PM   Clinical Narrative:     This CM spoke with pt at bedside who states she would like to go back to her independent living apartment at [facility]. This CM spoke with RN at [facility] who states that she can come back and they can assist her with additional RN and aide services. [facility] to provide HHPT/OT. This CM to fax HHPT/OT orders to [facility] Daughter Lovey Newcomer to pick pt up from hospital to transport her back to apartment.                       Social Determinants of Health (SDOH) Interventions     Readmission Risk Interventions No flowsheet data found.

## 2018-12-21 NOTE — Care Management Important Message (Signed)
Important Message  Patient Details  Name: Brandi Melton MRN: [id number] Date of Birth: 08/13/1930   Medicare Important Message Given:  Yes. CMA printed out IM for Case Management Nurse or CSW to give to patient.      [name] 12/21/2018, 11:12 AM

## 2018-12-21 NOTE — Discharge Summary (Signed)
Physician Discharge Summary  LAVENIA BUJOLD [id number] DOB: 10-14-1930 DOA: 83/07/2018  PCP: Haywood Pao, MD  Admit date: 12/17/2018 Discharge date: 12/21/2018  Admitted From:  nursing home Discharged to nursing home Recommendations for Outpatient Follow-up:  1. Follow up with PCP in 1-2 weeks 2. Please obtain BMP/CBC in one week 3. Please follow up with cardiology Dr. Harrington Challenger  Home Health: Yes Equipment/Devices none Discharge Condition stable CODE STATUS DO NOT RESUSCITATE Diet recommendation cardiac carb modified  brief/Interim Summary:Ameila J Langis a 85 y.o.femalewith history ofdiastolic CHF, IDDM-2, CKD 3, nonobstructive CAD, HTN, RA, OSAnot on CPAP anddepression. Patient was admitted with altered mental status, fever with T-max of 100.5 F and history of fall. Patient remains significantly confused and, therefore, could not give significant history. Urine culture has not grown any organisms. Will check procalcitonin, chest x-ray and will culture patient's blood. Patient was volume depleted on presentation. Patient is currently being assessed by PT OT team. We will continue work-up for for now. We will continue to hydrate patient. Further management will depend on hospital course. Patient may have underlying dementing illness. Patient's HbA1c is 10.6%. Discharge Diagnoses:  Active Problems:   Acute lower UTI   Fall at nursing home   Acute encephalopathy   Pressure injury of skin   Metabolic encephalopathy  Fall:Patient was admitted with a fall at the assisted living facility.  Work-up included a CT of the head without contrast with no acute finding.  She had mildly elevated CPK which was resolved with IV fluids.  Seen by PT and OT recommends PT and OT as an outpatient.  Fall precautions.  Acute encephalopathy, possibly metabolic versus combined metabolic and toxic: This was thought to be also secondary to dehydration he and mild rhabdomyolysis.  This improved with  IV hydration. Urine culture has not grown any organisms. -Chest x-ray, blood culture, procalcitonin, repeat CPK-no source of infection noted, or,findings suggestive of infection.  Elevated liver enzymes: -Suspectdue to mild rhabdomyolysis. -Repeat AST and ALT are within normal range.   IDDM-2: -Uncontrolled. -HbA1c was 10.6%. -Continuesubcutaneous Lantus to 30 units twice daily.  CKD-3: Stable.   SIRS/Mild leukocytosis: Leukocytosis has resolved.  Leukocytosis is likely reactive.  Hypertension:  Stable.  Continue to monitor.  Chronic diastolic CHF: -Echo in [id number] with EF of 55 to 60%, G1 DD.  Restart home diuretics.  Mild CAD: LHC in 2015revealedmild CAD.  OSA patient refused CPAP during the hospital stay.   Pressure Injury 12/17/18 Sacrum Mid Stage II -  Partial thickness loss of dermis presenting as a shallow open ulcer with a red, pink wound bed without slough. (Active)  12/17/18 0850  Location: Sacrum  Location Orientation: Mid  Staging: Stage II -  Partial thickness loss of dermis presenting as a shallow open ulcer with a red, pink wound bed without slough.  Wound Description (Comments):   Present on Admission: Yes    Estimated body mass index is 36.91 kg/m as calculated from the following:   Height as of this encounter: 5\' 5"  (1.651 m).   Weight as of this encounter: 100.6 kg.  Discharge Instructions  Discharge Instructions    Call MD for:  difficulty breathing, headache or visual disturbances   Complete by: As directed    Call MD for:  persistant nausea and vomiting   Complete by: As directed    Diet - low sodium heart healthy   Complete by: As directed    Increase activity slowly   Complete by: As directed  Allergies as of 12/21/2018   No Known Allergies     Medication List    STOP taking these medications   Levemir FlexTouch 100 UNIT/ML Pen Generic drug: Insulin Detemir     TAKE these medications    Accu-Chek Aviva Plus test strip Generic drug: glucose blood TEST BS QID   allopurinol 300 MG tablet Commonly known as: ZYLOPRIM Take 300 mg by mouth daily.   aspirin EC 81 MG tablet Take 81 mg by mouth daily.   calcium carbonate 750 MG chewable tablet Commonly known as: TUMS EX Chew 1 tablet by mouth daily.   carvedilol 80 MG 24 hr capsule Commonly known as: COREG CR TAKE 1 CAPSULE BY MOUTH  DAILY   chlorhexidine 0.12 % solution Commonly known as: PERIDEX Use as directed 5 mLs in the mouth or throat daily. Swish and spit   cholecalciferol 1000 units tablet Commonly known as: VITAMIN D Take 3,000 Units by mouth daily.   docusate sodium 100 MG capsule Commonly known as: COLACE Take 1 capsule (100 mg total) by mouth 2 (two) times daily.   DULoxetine 30 MG capsule Commonly known as: CYMBALTA Take 30 mg by mouth See admin instructions. Take 30mg  by mouth on Tuesday, Thursday, and the weekend   ezetimibe-simvastatin 10-40 MG tablet Commonly known as: VYTORIN TAKE 1 TABLET BY MOUTH  DAILY What changed: when to take this   insulin glargine 100 UNIT/ML injection Commonly known as: LANTUS Inject 0.3 mLs (30 Units total) into the skin 2 (two) times daily.   magnesium oxide 400 MG tablet Commonly known as: MAG-OX Take 1 tablet (400 mg total) by mouth daily.   NovoLOG FlexPen 100 UNIT/ML FlexPen Generic drug: insulin aspart Inject 15 Units into the skin See admin instructions. Inject 15 units subcutaneously two to three times daily with meals   potassium chloride SA 20 MEQ tablet Commonly known as: K-DUR TAKE 1 TABLET(20 MEQ) BY MOUTH DAILY What changed: See the new instructions.   senna-docusate 8.6-50 MG tablet Commonly known as: Senokot-S Take 1 tablet by mouth at bedtime as needed for mild constipation.   torsemide 20 MG tablet Commonly known as: DEMADEX Take 2 tablets (40 mg total) by mouth daily. Please make yearly appt with Dr. Harrington Challenger for December for future  refills. 1st attempt What changed: additional instructions   Vitamin D (Ergocalciferol) 1.25 MG (50000 UT) Caps capsule Commonly known as: DRISDOL Take 50,000 Units by mouth every Wednesday.      Follow-up Information    Tisovec, Fransico Him, MD Follow up.   Specialty: Internal Medicine Contact information: [place] [address] [phone]        Fay Records, MD .   Specialty: Cardiology Contact information: [place] [address] [place] [address] [phone]          No Known Allergies  Consultations: None Procedures/Studies: Ct Head Wo Contrast  Result Date: 12/17/2018 CLINICAL DATA:  Altered level of consciousness. Recently treated for urinary tract infection. EXAM: CT HEAD WITHOUT CONTRAST TECHNIQUE: Contiguous axial images were obtained from the base of the skull through the vertex without intravenous contrast. COMPARISON:  CT head 10/16/2018. FINDINGS: Brain: There is no evidence of acute intracranial hemorrhage, mass lesion, brain edema or extra-axial fluid collection. Stable mild atrophy with mild prominence of the ventricles and subarachnoid spaces. Patchy low-density in the periventricular white matter is stable, most likely due to chronic small vessel ischemic changes. There is no CT evidence of acute cortical infarction.  Vascular: Prominent intracranial vascular calcifications. No hyperdense vessel identified. Skull: Negative for fracture or focal lesion. Sinuses/Orbits: Stable mild mucosal thickening in the ethmoid and sphenoid sinuses. The mastoid air cells and middle ears are clear. No orbital abnormalities. Other: None. IMPRESSION: Stable head CT without acute intracranial findings. Mild atrophy and chronic small vessel ischemic changes. Electronically Signed   By: Richardean Sale M.D.   On: 12/17/2018 11:48   Dg Chest Port 1 View  Result Date: 12/18/2018 CLINICAL DATA:  Acute encephalopathy. Pt presented to ER yesterday with c/o  AMS. EXAM: PORTABLE CHEST 1 VIEW COMPARISON:  Chest radiograph 04/09/2017, 04/06/2017, 11/08/2015 FINDINGS: Stable cardiomediastinal contours with mildly enlarged heart size. Low volume study. Mild linear opacities at the left lung base likely reflect atelectasis or scarring. No pneumothorax or pleural effusion. No acute osseous abnormality in visualized portions of the skeleton. IMPRESSION: Mild opacities at the left base favored to represent atelectasis or scarring, less likely developing infiltrate. Electronically Signed   By: Audie Pinto M.D.   On: 12/18/2018 15:31    (Echo, Carotid, EGD, Colonoscopy, ERCP)    Subjective: Resting in bed awake alert very anxious to go back to where she came from.  Discharge Exam: Vitals:   12/20/18 2023 12/21/18 0558  BP: (!) 133/52 136/61  Pulse: 69 70  Resp:  18  Temp:  98.2 F (36.8 C)  SpO2:  99%   Vitals:   12/20/18 2009 12/20/18 2023 12/21/18 0558 12/21/18 0628  BP: (!) 140/40 (!) 133/52 136/61   Pulse: 72 69 70   Resp: 18  18   Temp: 98.2 F (36.8 C)  98.2 F (36.8 C)   TempSrc: Oral  Oral   SpO2: 93%  99%   Weight:    100.6 kg  Height:        General: Pt is alert, awake, not in acute distress Cardiovascular: RRR, S1/S2 +, no rubs, no gallops Respiratory: CTA bilaterally, no wheezing, no rhonchi Abdominal: Soft, NT, ND, bowel sounds + Extremities: Trace bilateral pitting edema   The results of significant diagnostics from this hospitalization (including imaging, microbiology, ancillary and laboratory) are listed below for reference.     Microbiology: Recent Results (from the past 240 hour(s))  SARS Coronavirus 2 [facility] order, Performed in [facility] hospital lab) Nasopharyngeal Nasopharyngeal Swab     Status: None   Collection Time: 12/17/18  8:50 AM   Specimen: Nasopharyngeal Swab  Result Value Ref Range Status   SARS Coronavirus 2 NEGATIVE NEGATIVE Final    Comment: (NOTE) If result is NEGATIVE SARS-CoV-2  target nucleic acids are NOT DETECTED. The SARS-CoV-2 RNA is generally detectable in upper and lower  respiratory specimens during the acute phase of infection. The lowest  concentration of SARS-CoV-2 viral copies this assay can detect is 250  copies / mL. A negative result does not preclude SARS-CoV-2 infection  and should not be used as the sole basis for treatment or other  patient management decisions.  A negative result may occur with  improper specimen collection / handling, submission of specimen other  than nasopharyngeal swab, presence of viral mutation(s) within the  areas targeted by this assay, and inadequate number of viral copies  (<250 copies / mL). A negative result must be combined with clinical  observations, patient history, and epidemiological information. If result is POSITIVE SARS-CoV-2 target nucleic acids are DETECTED. The SARS-CoV-2 RNA is generally detectable in upper and lower  respiratory specimens dur ing the acute phase of infection.  Positive  results are indicative of active infection with SARS-CoV-2.  Clinical  correlation with patient history and other diagnostic information is  necessary to determine patient infection status.  Positive results do  not rule out bacterial infection or co-infection with other viruses. If result is PRESUMPTIVE POSTIVE SARS-CoV-2 nucleic acids MAY BE PRESENT.   A presumptive positive result was obtained on the submitted specimen  and confirmed on repeat testing.  While 2019 novel coronavirus  (SARS-CoV-2) nucleic acids may be present in the submitted sample  additional confirmatory testing may be necessary for epidemiological  and / or clinical management purposes  to differentiate between  SARS-CoV-2 and other Sarbecovirus currently known to infect humans.  If clinically indicated additional testing with an alternate test  methodology [phone]) is advised. The SARS-CoV-2 RNA is generally  detectable in upper and lower  respiratory sp ecimens during the acute  phase of infection. The expected result is Negative. Fact Sheet for Patients:  StrictlyIdeas.no Fact Sheet for Healthcare Providers: BankingDealers.co.za This test is not yet approved or cleared by the [place] FDA and has been authorized for detection and/or diagnosis of SARS-CoV-2 by FDA under an Emergency Use Authorization (EUA).  This EUA will remain in effect (meaning this test can be used) for the duration of the COVID-19 declaration under Section 564(b)(1) of the Act, 21 U.S.C. section 360bbb-3(b)(1), unless the authorization is terminated or revoked sooner. Performed at [facility], [place] [address], [place], [place] [address]   Urine culture     Status: None   Collection Time: 12/17/18  9:57 AM   Specimen: Urine, Catheterized  Result Value Ref Range Status   Specimen Description   Final    URINE, CATHETERIZED Performed at [facility] [address], [place], [place] [address]    Special Requests   Final    NONE Performed at [facility], [place] [address], [place], [place] [address]    Culture   Final    NO GROWTH Performed at [facility], [place] [address], [place], [place] [address]    Report Status 12/18/2018 FINAL  Final  Culture, blood (routine x 2)     Status: None (Preliminary result)   Collection Time: 12/18/18  2:41 PM   Specimen: BLOOD RIGHT HAND  Result Value Ref Range Status   Specimen Description   Final    BLOOD RIGHT HAND Performed at [facility] [address], [place], [place] [address]    Special Requests   Final    BOTTLES DRAWN AEROBIC ONLY Blood Culture adequate volume Performed at [facility] [address], [place], [place] [address]    Culture   Final    NO GROWTH 3 DAYS Performed at [facility], [place] [address], [place], [place] [address]     Report Status PENDING  Incomplete  Culture, blood (routine x 2)     Status: None (Preliminary result)   Collection Time: 12/18/18  2:41 PM   Specimen: BLOOD  Result Value Ref Range Status   Specimen Description   Final    BLOOD LEFT ANTECUBITAL Performed at [facility], [place] [address], [place], [place] [address]    Special Requests   Final    BOTTLES DRAWN AEROBIC ONLY Blood Culture adequate volume Performed at [place] [address], [place], [place] [address]    Culture   Final    NO GROWTH 3 DAYS  Performed at [facility], [place] [address] [place], [place] [address]    Report Status PENDING  Incomplete     Labs: BNP (last 3 results) No results for input(s): BNP in the last 8760 hours. Basic Metabolic Panel: Recent Labs  Lab 12/17/18 0912 12/17/18 1648 12/18/18 0311 12/19/18 0502 12/19/18 1322  NA 136  --  136 137  --   K 4.0  --  4.2 3.9  --   CL 97*  --  105 102  --   CO2 27  --  24 24  --   GLUCOSE 78  --  197* 203* 277*  BUN 41*  --  35* 27*  --   CREATININE 1.55* 1.34* 1.41* 1.28*  --   CALCIUM 9.0  --  7.8* 8.1*  --   MG  --   --   --  2.4  --   PHOS  --   --   --  3.0  --    Liver Function Tests: Recent Labs  Lab 12/17/18 0912 12/18/18 0311 12/19/18 0502  AST 54* 34  --   ALT 45* 27  --   ALKPHOS 130* 92  --   BILITOT 0.5 0.6  --   PROT 8.4* 5.8*  --   ALBUMIN 3.6 2.5* 2.3*   No results for input(s): LIPASE, AMYLASE in the last 168 hours. No results for input(s): AMMONIA in the last 168 hours. CBC: Recent Labs  Lab 12/17/18 0912 12/17/18 1648 12/18/18 0311 12/19/18 0502  WBC 12.3* 10.6* 8.4 9.4  NEUTROABS 7.8*  --   --  5.3  HGB 13.1 11.5* 10.4* 9.9*  HCT 43.3 37.8 35.6* 33.2*  MCV 98.2 98.4 101.1* 100.0  PLT 268 245 207 209   Cardiac Enzymes: Recent Labs  Lab 12/17/18 0912 12/18/18 1441  CKTOTAL 347* 161   BNP: Invalid input(s): POCBNP CBG: Recent Labs  Lab 12/20/18 1638 12/20/18 2158  12/21/18 0729 12/21/18 0758 12/21/18 1113  GLUCAP 176* 108* 58* 79 173*   D-Dimer No results for input(s): DDIMER in the last 72 hours. Hgb A1c No results for input(s): HGBA1C in the last 72 hours. Lipid Profile No results for input(s): CHOL, HDL, LDLCALC, TRIG, CHOLHDL, LDLDIRECT in the last 72 hours. Thyroid function studies No results for input(s): TSH, T4TOTAL, T3FREE, THYROIDAB in the last 72 hours.  Invalid input(s): FREET3 Anemia work up No results for input(s): VITAMINB12, FOLATE, FERRITIN, TIBC, IRON, RETICCTPCT in the last 72 hours. Urinalysis    Component Value Date/Time   COLORURINE YELLOW 12/17/2018 0849   APPEARANCEUR CLEAR 12/17/2018 0849   APPEARANCEUR Cloudy (A) 08/12/2016 1312   LABSPEC 1.013 12/17/2018 0849   PHURINE 6.0 12/17/2018 0849   GLUCOSEU NEGATIVE 12/17/2018 0849   HGBUR MODERATE (A) 12/17/2018 0849   BILIRUBINUR NEGATIVE 12/17/2018 0849   BILIRUBINUR Negative 08/12/2016 1312   KETONESUR NEGATIVE 12/17/2018 0849   PROTEINUR NEGATIVE 12/17/2018 0849   NITRITE NEGATIVE 12/17/2018 0849   LEUKOCYTESUR MODERATE (A) 12/17/2018 0849   Sepsis Labs Invalid input(s): PROCALCITONIN,  WBC,  LACTICIDVEN Microbiology Recent Results (from the past 240 hour(s))  SARS Coronavirus 2 [facility] order, Performed in [facility] hospital lab) Nasopharyngeal Nasopharyngeal Swab     Status: None   Collection Time: 12/17/18  8:50 AM   Specimen: Nasopharyngeal Swab  Result Value Ref Range Status   SARS Coronavirus 2 NEGATIVE NEGATIVE Final    Comment: (NOTE) If result is NEGATIVE SARS-CoV-2 target nucleic acids are NOT DETECTED. The SARS-CoV-2  RNA is generally detectable in upper and lower  respiratory specimens during the acute phase of infection. The lowest  concentration of SARS-CoV-2 viral copies this assay can detect is 250  copies / mL. A negative result does not preclude SARS-CoV-2 infection  and should not be used as the sole basis for treatment or other   patient management decisions.  A negative result may occur with  improper specimen collection / handling, submission of specimen other  than nasopharyngeal swab, presence of viral mutation(s) within the  areas targeted by this assay, and inadequate number of viral copies  (<250 copies / mL). A negative result must be combined with clinical  observations, patient history, and epidemiological information. If result is POSITIVE SARS-CoV-2 target nucleic acids are DETECTED. The SARS-CoV-2 RNA is generally detectable in upper and lower  respiratory specimens dur ing the acute phase of infection.  Positive  results are indicative of active infection with SARS-CoV-2.  Clinical  correlation with patient history and other diagnostic information is  necessary to determine patient infection status.  Positive results do  not rule out bacterial infection or co-infection with other viruses. If result is PRESUMPTIVE POSTIVE SARS-CoV-2 nucleic acids MAY BE PRESENT.   A presumptive positive result was obtained on the submitted specimen  and confirmed on repeat testing.  While 2019 novel coronavirus  (SARS-CoV-2) nucleic acids may be present in the submitted sample  additional confirmatory testing may be necessary for epidemiological  and / or clinical management purposes  to differentiate between  SARS-CoV-2 and other Sarbecovirus currently known to infect humans.  If clinically indicated additional testing with an alternate test  methodology [phone]) is advised. The SARS-CoV-2 RNA is generally  detectable in upper and lower respiratory sp ecimens during the acute  phase of infection. The expected result is Negative. Fact Sheet for Patients:  StrictlyIdeas.no Fact Sheet for Healthcare Providers: BankingDealers.co.za This test is not yet approved or cleared by the [place] FDA and has been authorized for detection and/or diagnosis of SARS-CoV-2  by FDA under an Emergency Use Authorization (EUA).  This EUA will remain in effect (meaning this test can be used) for the duration of the COVID-19 declaration under Section 564(b)(1) of the Act, 21 U.S.C. section 360bbb-3(b)(1), unless the authorization is terminated or revoked sooner. Performed at [facility], [place] [address], [place], [place] [address]   Urine culture     Status: None   Collection Time: 12/17/18  9:57 AM   Specimen: Urine, Catheterized  Result Value Ref Range Status   Specimen Description   Final    URINE, CATHETERIZED Performed at [facility] [address] [place], [place] [address]    Special Requests   Final    NONE Performed at [facility], [place] [address], [place], [place] [address]    Culture   Final    NO GROWTH Performed at [facility], [place] [address] [place], [place] [address]    Report Status 12/18/2018 FINAL  Final  Culture, blood (routine x 2)     Status: None (Preliminary result)   Collection Time: 12/18/18  2:41 PM   Specimen: BLOOD RIGHT HAND  Result Value Ref Range Status   Specimen Description   Final    BLOOD RIGHT HAND Performed at [facility] [address] [place], [place] [address]    Special Requests   Final    BOTTLES DRAWN AEROBIC ONLY Blood Culture adequate volume Performed  at [facility], [place] [address], [place], [place] [address]    Culture   Final    NO GROWTH 3 DAYS Performed at [facility], [place] [address] [place], [place] [address]    Report Status PENDING  Incomplete  Culture, blood (routine x 2)     Status: None (Preliminary result)   Collection Time: 12/18/18  2:41 PM   Specimen: BLOOD  Result Value Ref Range Status   Specimen Description   Final    BLOOD LEFT ANTECUBITAL Performed at [facility], [place] [address], [place], [place] [address]    Special Requests   Final    BOTTLES DRAWN  AEROBIC ONLY Blood Culture adequate volume Performed at [place] [address], [place], [place] [address]    Culture   Final    NO GROWTH 3 DAYS Performed at [facility], [place] [address] [place],  [address]    Report Status PENDING  Incomplete     Time coordinating discharge:  33 minutes  SIGNED:   Georgette Shell, MD  [facility] 12/21/2018, 12:50 PM  If 7PM-7AM, please contact night-coverage [email or web address] Password [email or web address]

## 2018-12-21 NOTE — Progress Notes (Signed)
Discharge instructions given to her daughter. Daughter had no questions. NT or writer will wheel patient out at 4pm once daughter comes in

## 2018-12-22 DIAGNOSIS — R262 Difficulty in walking, not elsewhere classified: Secondary | ICD-10-CM | POA: Diagnosis not present

## 2018-12-22 DIAGNOSIS — R2681 Unsteadiness on feet: Secondary | ICD-10-CM | POA: Diagnosis not present

## 2018-12-22 DIAGNOSIS — Z9181 History of falling: Secondary | ICD-10-CM | POA: Diagnosis not present

## 2018-12-22 DIAGNOSIS — R278 Other lack of coordination: Secondary | ICD-10-CM | POA: Diagnosis not present

## 2018-12-22 DIAGNOSIS — N3941 Urge incontinence: Secondary | ICD-10-CM | POA: Diagnosis not present

## 2018-12-22 DIAGNOSIS — M6281 Muscle weakness (generalized): Secondary | ICD-10-CM | POA: Diagnosis not present

## 2018-12-22 DIAGNOSIS — M25551 Pain in right hip: Secondary | ICD-10-CM | POA: Diagnosis not present

## 2018-12-22 LAB — CALCIUM, IONIZED: Calcium, Ionized, Serum: 4.7 mg/dL (ref 4.5–5.6)

## 2018-12-23 DIAGNOSIS — W1830XA Fall on same level, unspecified, initial encounter: Secondary | ICD-10-CM | POA: Diagnosis not present

## 2018-12-23 DIAGNOSIS — R2681 Unsteadiness on feet: Secondary | ICD-10-CM | POA: Diagnosis not present

## 2018-12-23 DIAGNOSIS — I2510 Atherosclerotic heart disease of native coronary artery without angina pectoris: Secondary | ICD-10-CM | POA: Diagnosis not present

## 2018-12-23 DIAGNOSIS — I130 Hypertensive heart and chronic kidney disease with heart failure and stage 1 through stage 4 chronic kidney disease, or unspecified chronic kidney disease: Secondary | ICD-10-CM | POA: Diagnosis not present

## 2018-12-23 DIAGNOSIS — N3941 Urge incontinence: Secondary | ICD-10-CM | POA: Diagnosis not present

## 2018-12-23 DIAGNOSIS — G9341 Metabolic encephalopathy: Secondary | ICD-10-CM | POA: Diagnosis not present

## 2018-12-23 DIAGNOSIS — R262 Difficulty in walking, not elsewhere classified: Secondary | ICD-10-CM | POA: Diagnosis not present

## 2018-12-23 DIAGNOSIS — M6281 Muscle weakness (generalized): Secondary | ICD-10-CM | POA: Diagnosis not present

## 2018-12-23 DIAGNOSIS — R748 Abnormal levels of other serum enzymes: Secondary | ICD-10-CM | POA: Diagnosis not present

## 2018-12-23 DIAGNOSIS — I5032 Chronic diastolic (congestive) heart failure: Secondary | ICD-10-CM | POA: Diagnosis not present

## 2018-12-23 DIAGNOSIS — D72829 Elevated white blood cell count, unspecified: Secondary | ICD-10-CM | POA: Diagnosis not present

## 2018-12-23 DIAGNOSIS — M25551 Pain in right hip: Secondary | ICD-10-CM | POA: Diagnosis not present

## 2018-12-23 DIAGNOSIS — N184 Chronic kidney disease, stage 4 (severe): Secondary | ICD-10-CM | POA: Diagnosis not present

## 2018-12-23 DIAGNOSIS — R278 Other lack of coordination: Secondary | ICD-10-CM | POA: Diagnosis not present

## 2018-12-23 DIAGNOSIS — Z0289 Encounter for other administrative examinations: Secondary | ICD-10-CM | POA: Diagnosis not present

## 2018-12-23 DIAGNOSIS — E1165 Type 2 diabetes mellitus with hyperglycemia: Secondary | ICD-10-CM | POA: Diagnosis not present

## 2018-12-23 DIAGNOSIS — Z9181 History of falling: Secondary | ICD-10-CM | POA: Diagnosis not present

## 2018-12-24 DIAGNOSIS — N3941 Urge incontinence: Secondary | ICD-10-CM | POA: Diagnosis not present

## 2018-12-24 DIAGNOSIS — M6281 Muscle weakness (generalized): Secondary | ICD-10-CM | POA: Diagnosis not present

## 2018-12-24 DIAGNOSIS — M25551 Pain in right hip: Secondary | ICD-10-CM | POA: Diagnosis not present

## 2018-12-24 DIAGNOSIS — R262 Difficulty in walking, not elsewhere classified: Secondary | ICD-10-CM | POA: Diagnosis not present

## 2018-12-27 LAB — CULTURE, BLOOD (ROUTINE X 2)
Culture: NO GROWTH
Culture: NO GROWTH
Special Requests: ADEQUATE
Special Requests: ADEQUATE

## 2018-12-28 DIAGNOSIS — M6281 Muscle weakness (generalized): Secondary | ICD-10-CM | POA: Diagnosis not present

## 2018-12-28 DIAGNOSIS — R278 Other lack of coordination: Secondary | ICD-10-CM | POA: Diagnosis not present

## 2018-12-28 DIAGNOSIS — Z9181 History of falling: Secondary | ICD-10-CM | POA: Diagnosis not present

## 2018-12-28 DIAGNOSIS — R262 Difficulty in walking, not elsewhere classified: Secondary | ICD-10-CM | POA: Diagnosis not present

## 2018-12-28 DIAGNOSIS — M25551 Pain in right hip: Secondary | ICD-10-CM | POA: Diagnosis not present

## 2018-12-28 DIAGNOSIS — N3941 Urge incontinence: Secondary | ICD-10-CM | POA: Diagnosis not present

## 2018-12-28 DIAGNOSIS — R2681 Unsteadiness on feet: Secondary | ICD-10-CM | POA: Diagnosis not present

## 2018-12-29 DIAGNOSIS — M25551 Pain in right hip: Secondary | ICD-10-CM | POA: Diagnosis not present

## 2018-12-29 DIAGNOSIS — N3941 Urge incontinence: Secondary | ICD-10-CM | POA: Diagnosis not present

## 2018-12-29 DIAGNOSIS — R278 Other lack of coordination: Secondary | ICD-10-CM | POA: Diagnosis not present

## 2018-12-29 DIAGNOSIS — Z9181 History of falling: Secondary | ICD-10-CM | POA: Diagnosis not present

## 2018-12-29 DIAGNOSIS — R2681 Unsteadiness on feet: Secondary | ICD-10-CM | POA: Diagnosis not present

## 2018-12-29 DIAGNOSIS — M6281 Muscle weakness (generalized): Secondary | ICD-10-CM | POA: Diagnosis not present

## 2018-12-29 DIAGNOSIS — R262 Difficulty in walking, not elsewhere classified: Secondary | ICD-10-CM | POA: Diagnosis not present

## 2018-12-30 DIAGNOSIS — M25551 Pain in right hip: Secondary | ICD-10-CM | POA: Diagnosis not present

## 2018-12-30 DIAGNOSIS — Z9181 History of falling: Secondary | ICD-10-CM | POA: Diagnosis not present

## 2018-12-30 DIAGNOSIS — R278 Other lack of coordination: Secondary | ICD-10-CM | POA: Diagnosis not present

## 2018-12-30 DIAGNOSIS — M6281 Muscle weakness (generalized): Secondary | ICD-10-CM | POA: Diagnosis not present

## 2018-12-30 DIAGNOSIS — N3941 Urge incontinence: Secondary | ICD-10-CM | POA: Diagnosis not present

## 2018-12-30 DIAGNOSIS — R262 Difficulty in walking, not elsewhere classified: Secondary | ICD-10-CM | POA: Diagnosis not present

## 2018-12-30 DIAGNOSIS — R2681 Unsteadiness on feet: Secondary | ICD-10-CM | POA: Diagnosis not present

## 2018-12-31 DIAGNOSIS — N3941 Urge incontinence: Secondary | ICD-10-CM | POA: Diagnosis not present

## 2018-12-31 DIAGNOSIS — Z9181 History of falling: Secondary | ICD-10-CM | POA: Diagnosis not present

## 2018-12-31 DIAGNOSIS — R278 Other lack of coordination: Secondary | ICD-10-CM | POA: Diagnosis not present

## 2018-12-31 DIAGNOSIS — R2681 Unsteadiness on feet: Secondary | ICD-10-CM | POA: Diagnosis not present

## 2018-12-31 DIAGNOSIS — M25551 Pain in right hip: Secondary | ICD-10-CM | POA: Diagnosis not present

## 2018-12-31 DIAGNOSIS — R262 Difficulty in walking, not elsewhere classified: Secondary | ICD-10-CM | POA: Diagnosis not present

## 2018-12-31 DIAGNOSIS — M6281 Muscle weakness (generalized): Secondary | ICD-10-CM | POA: Diagnosis not present

## 2019-01-03 DIAGNOSIS — Z9181 History of falling: Secondary | ICD-10-CM | POA: Diagnosis not present

## 2019-01-03 DIAGNOSIS — R278 Other lack of coordination: Secondary | ICD-10-CM | POA: Diagnosis not present

## 2019-01-03 DIAGNOSIS — N3941 Urge incontinence: Secondary | ICD-10-CM | POA: Diagnosis not present

## 2019-01-03 DIAGNOSIS — M25551 Pain in right hip: Secondary | ICD-10-CM | POA: Diagnosis not present

## 2019-01-03 DIAGNOSIS — R262 Difficulty in walking, not elsewhere classified: Secondary | ICD-10-CM | POA: Diagnosis not present

## 2019-01-03 DIAGNOSIS — R2681 Unsteadiness on feet: Secondary | ICD-10-CM | POA: Diagnosis not present

## 2019-01-03 DIAGNOSIS — M6281 Muscle weakness (generalized): Secondary | ICD-10-CM | POA: Diagnosis not present

## 2019-01-05 DIAGNOSIS — M6281 Muscle weakness (generalized): Secondary | ICD-10-CM | POA: Diagnosis not present

## 2019-01-05 DIAGNOSIS — Z9181 History of falling: Secondary | ICD-10-CM | POA: Diagnosis not present

## 2019-01-05 DIAGNOSIS — M25551 Pain in right hip: Secondary | ICD-10-CM | POA: Diagnosis not present

## 2019-01-05 DIAGNOSIS — R262 Difficulty in walking, not elsewhere classified: Secondary | ICD-10-CM | POA: Diagnosis not present

## 2019-01-05 DIAGNOSIS — N3941 Urge incontinence: Secondary | ICD-10-CM | POA: Diagnosis not present

## 2019-01-05 DIAGNOSIS — R2681 Unsteadiness on feet: Secondary | ICD-10-CM | POA: Diagnosis not present

## 2019-01-05 DIAGNOSIS — R278 Other lack of coordination: Secondary | ICD-10-CM | POA: Diagnosis not present

## 2019-01-06 DIAGNOSIS — M6281 Muscle weakness (generalized): Secondary | ICD-10-CM | POA: Diagnosis not present

## 2019-01-06 DIAGNOSIS — R278 Other lack of coordination: Secondary | ICD-10-CM | POA: Diagnosis not present

## 2019-01-06 DIAGNOSIS — Z1383 Encounter for screening for respiratory disorder NEC: Secondary | ICD-10-CM | POA: Diagnosis not present

## 2019-01-06 DIAGNOSIS — R262 Difficulty in walking, not elsewhere classified: Secondary | ICD-10-CM | POA: Diagnosis not present

## 2019-01-06 DIAGNOSIS — M25551 Pain in right hip: Secondary | ICD-10-CM | POA: Diagnosis not present

## 2019-01-06 DIAGNOSIS — N3941 Urge incontinence: Secondary | ICD-10-CM | POA: Diagnosis not present

## 2019-01-06 DIAGNOSIS — Z20828 Contact with and (suspected) exposure to other viral communicable diseases: Secondary | ICD-10-CM | POA: Diagnosis not present

## 2019-01-06 DIAGNOSIS — R2681 Unsteadiness on feet: Secondary | ICD-10-CM | POA: Diagnosis not present

## 2019-01-06 DIAGNOSIS — Z9181 History of falling: Secondary | ICD-10-CM | POA: Diagnosis not present

## 2019-01-11 DIAGNOSIS — I130 Hypertensive heart and chronic kidney disease with heart failure and stage 1 through stage 4 chronic kidney disease, or unspecified chronic kidney disease: Secondary | ICD-10-CM | POA: Diagnosis not present

## 2019-01-11 DIAGNOSIS — N184 Chronic kidney disease, stage 4 (severe): Secondary | ICD-10-CM | POA: Diagnosis not present

## 2019-01-11 DIAGNOSIS — E1122 Type 2 diabetes mellitus with diabetic chronic kidney disease: Secondary | ICD-10-CM | POA: Diagnosis not present

## 2019-01-11 DIAGNOSIS — L89152 Pressure ulcer of sacral region, stage 2: Secondary | ICD-10-CM | POA: Diagnosis not present

## 2019-01-11 DIAGNOSIS — E1159 Type 2 diabetes mellitus with other circulatory complications: Secondary | ICD-10-CM | POA: Diagnosis not present

## 2019-01-11 DIAGNOSIS — I5032 Chronic diastolic (congestive) heart failure: Secondary | ICD-10-CM | POA: Diagnosis not present

## 2019-01-11 DIAGNOSIS — Z794 Long term (current) use of insulin: Secondary | ICD-10-CM | POA: Diagnosis not present

## 2019-01-11 DIAGNOSIS — I872 Venous insufficiency (chronic) (peripheral): Secondary | ICD-10-CM | POA: Diagnosis not present

## 2019-01-11 DIAGNOSIS — G4733 Obstructive sleep apnea (adult) (pediatric): Secondary | ICD-10-CM | POA: Diagnosis not present

## 2019-01-12 DIAGNOSIS — I130 Hypertensive heart and chronic kidney disease with heart failure and stage 1 through stage 4 chronic kidney disease, or unspecified chronic kidney disease: Secondary | ICD-10-CM | POA: Diagnosis not present

## 2019-01-12 DIAGNOSIS — E1122 Type 2 diabetes mellitus with diabetic chronic kidney disease: Secondary | ICD-10-CM | POA: Diagnosis not present

## 2019-01-12 DIAGNOSIS — N184 Chronic kidney disease, stage 4 (severe): Secondary | ICD-10-CM | POA: Diagnosis not present

## 2019-01-12 DIAGNOSIS — L89152 Pressure ulcer of sacral region, stage 2: Secondary | ICD-10-CM | POA: Diagnosis not present

## 2019-01-12 DIAGNOSIS — Z794 Long term (current) use of insulin: Secondary | ICD-10-CM | POA: Diagnosis not present

## 2019-01-12 DIAGNOSIS — I872 Venous insufficiency (chronic) (peripheral): Secondary | ICD-10-CM | POA: Diagnosis not present

## 2019-01-12 DIAGNOSIS — E1159 Type 2 diabetes mellitus with other circulatory complications: Secondary | ICD-10-CM | POA: Diagnosis not present

## 2019-01-12 DIAGNOSIS — G4733 Obstructive sleep apnea (adult) (pediatric): Secondary | ICD-10-CM | POA: Diagnosis not present

## 2019-01-12 DIAGNOSIS — I5032 Chronic diastolic (congestive) heart failure: Secondary | ICD-10-CM | POA: Diagnosis not present

## 2019-01-13 DIAGNOSIS — R69 Illness, unspecified: Secondary | ICD-10-CM | POA: Diagnosis not present

## 2019-01-13 DIAGNOSIS — R5381 Other malaise: Secondary | ICD-10-CM | POA: Diagnosis not present

## 2019-01-13 DIAGNOSIS — M1A09X0 Idiopathic chronic gout, multiple sites, without tophus (tophi): Secondary | ICD-10-CM | POA: Diagnosis not present

## 2019-01-13 DIAGNOSIS — E782 Mixed hyperlipidemia: Secondary | ICD-10-CM | POA: Diagnosis not present

## 2019-01-14 DIAGNOSIS — L89152 Pressure ulcer of sacral region, stage 2: Secondary | ICD-10-CM | POA: Diagnosis not present

## 2019-01-14 DIAGNOSIS — E1159 Type 2 diabetes mellitus with other circulatory complications: Secondary | ICD-10-CM | POA: Diagnosis not present

## 2019-01-14 DIAGNOSIS — I872 Venous insufficiency (chronic) (peripheral): Secondary | ICD-10-CM | POA: Diagnosis not present

## 2019-01-14 DIAGNOSIS — I130 Hypertensive heart and chronic kidney disease with heart failure and stage 1 through stage 4 chronic kidney disease, or unspecified chronic kidney disease: Secondary | ICD-10-CM | POA: Diagnosis not present

## 2019-01-14 DIAGNOSIS — N184 Chronic kidney disease, stage 4 (severe): Secondary | ICD-10-CM | POA: Diagnosis not present

## 2019-01-14 DIAGNOSIS — G4733 Obstructive sleep apnea (adult) (pediatric): Secondary | ICD-10-CM | POA: Diagnosis not present

## 2019-01-14 DIAGNOSIS — E1122 Type 2 diabetes mellitus with diabetic chronic kidney disease: Secondary | ICD-10-CM | POA: Diagnosis not present

## 2019-01-14 DIAGNOSIS — I5032 Chronic diastolic (congestive) heart failure: Secondary | ICD-10-CM | POA: Diagnosis not present

## 2019-01-14 DIAGNOSIS — Z794 Long term (current) use of insulin: Secondary | ICD-10-CM | POA: Diagnosis not present

## 2019-01-18 DIAGNOSIS — I10 Essential (primary) hypertension: Secondary | ICD-10-CM | POA: Diagnosis not present

## 2019-01-18 DIAGNOSIS — Z794 Long term (current) use of insulin: Secondary | ICD-10-CM | POA: Diagnosis not present

## 2019-01-18 DIAGNOSIS — N184 Chronic kidney disease, stage 4 (severe): Secondary | ICD-10-CM | POA: Diagnosis not present

## 2019-01-18 DIAGNOSIS — E119 Type 2 diabetes mellitus without complications: Secondary | ICD-10-CM | POA: Diagnosis not present

## 2019-01-18 DIAGNOSIS — G4733 Obstructive sleep apnea (adult) (pediatric): Secondary | ICD-10-CM | POA: Diagnosis not present

## 2019-01-18 DIAGNOSIS — E1159 Type 2 diabetes mellitus with other circulatory complications: Secondary | ICD-10-CM | POA: Diagnosis not present

## 2019-01-18 DIAGNOSIS — E1122 Type 2 diabetes mellitus with diabetic chronic kidney disease: Secondary | ICD-10-CM | POA: Diagnosis not present

## 2019-01-18 DIAGNOSIS — E7800 Pure hypercholesterolemia, unspecified: Secondary | ICD-10-CM | POA: Diagnosis not present

## 2019-01-18 DIAGNOSIS — I5032 Chronic diastolic (congestive) heart failure: Secondary | ICD-10-CM | POA: Diagnosis not present

## 2019-01-18 DIAGNOSIS — L89152 Pressure ulcer of sacral region, stage 2: Secondary | ICD-10-CM | POA: Diagnosis not present

## 2019-01-18 DIAGNOSIS — E08319 Diabetes mellitus due to underlying condition with unspecified diabetic retinopathy without macular edema: Secondary | ICD-10-CM | POA: Diagnosis not present

## 2019-01-18 DIAGNOSIS — I872 Venous insufficiency (chronic) (peripheral): Secondary | ICD-10-CM | POA: Diagnosis not present

## 2019-01-18 DIAGNOSIS — E785 Hyperlipidemia, unspecified: Secondary | ICD-10-CM | POA: Diagnosis not present

## 2019-01-18 DIAGNOSIS — I130 Hypertensive heart and chronic kidney disease with heart failure and stage 1 through stage 4 chronic kidney disease, or unspecified chronic kidney disease: Secondary | ICD-10-CM | POA: Diagnosis not present

## 2019-01-19 DIAGNOSIS — N184 Chronic kidney disease, stage 4 (severe): Secondary | ICD-10-CM | POA: Diagnosis not present

## 2019-01-19 DIAGNOSIS — I130 Hypertensive heart and chronic kidney disease with heart failure and stage 1 through stage 4 chronic kidney disease, or unspecified chronic kidney disease: Secondary | ICD-10-CM | POA: Diagnosis not present

## 2019-01-19 DIAGNOSIS — L89152 Pressure ulcer of sacral region, stage 2: Secondary | ICD-10-CM | POA: Diagnosis not present

## 2019-01-19 DIAGNOSIS — E1122 Type 2 diabetes mellitus with diabetic chronic kidney disease: Secondary | ICD-10-CM | POA: Diagnosis not present

## 2019-01-19 DIAGNOSIS — G4733 Obstructive sleep apnea (adult) (pediatric): Secondary | ICD-10-CM | POA: Diagnosis not present

## 2019-01-19 DIAGNOSIS — I872 Venous insufficiency (chronic) (peripheral): Secondary | ICD-10-CM | POA: Diagnosis not present

## 2019-01-19 DIAGNOSIS — E1159 Type 2 diabetes mellitus with other circulatory complications: Secondary | ICD-10-CM | POA: Diagnosis not present

## 2019-01-19 DIAGNOSIS — Z794 Long term (current) use of insulin: Secondary | ICD-10-CM | POA: Diagnosis not present

## 2019-01-19 DIAGNOSIS — I5032 Chronic diastolic (congestive) heart failure: Secondary | ICD-10-CM | POA: Diagnosis not present

## 2019-01-20 DIAGNOSIS — I5032 Chronic diastolic (congestive) heart failure: Secondary | ICD-10-CM | POA: Diagnosis not present

## 2019-01-20 DIAGNOSIS — G4733 Obstructive sleep apnea (adult) (pediatric): Secondary | ICD-10-CM | POA: Diagnosis not present

## 2019-01-20 DIAGNOSIS — N184 Chronic kidney disease, stage 4 (severe): Secondary | ICD-10-CM | POA: Diagnosis not present

## 2019-01-20 DIAGNOSIS — Z794 Long term (current) use of insulin: Secondary | ICD-10-CM | POA: Diagnosis not present

## 2019-01-20 DIAGNOSIS — I872 Venous insufficiency (chronic) (peripheral): Secondary | ICD-10-CM | POA: Diagnosis not present

## 2019-01-20 DIAGNOSIS — E1165 Type 2 diabetes mellitus with hyperglycemia: Secondary | ICD-10-CM | POA: Diagnosis not present

## 2019-01-20 DIAGNOSIS — E1159 Type 2 diabetes mellitus with other circulatory complications: Secondary | ICD-10-CM | POA: Diagnosis not present

## 2019-01-20 DIAGNOSIS — I130 Hypertensive heart and chronic kidney disease with heart failure and stage 1 through stage 4 chronic kidney disease, or unspecified chronic kidney disease: Secondary | ICD-10-CM | POA: Diagnosis not present

## 2019-01-20 DIAGNOSIS — E1122 Type 2 diabetes mellitus with diabetic chronic kidney disease: Secondary | ICD-10-CM | POA: Diagnosis not present

## 2019-01-20 DIAGNOSIS — L89152 Pressure ulcer of sacral region, stage 2: Secondary | ICD-10-CM | POA: Diagnosis not present

## 2019-01-21 DIAGNOSIS — Z794 Long term (current) use of insulin: Secondary | ICD-10-CM | POA: Diagnosis not present

## 2019-01-21 DIAGNOSIS — I872 Venous insufficiency (chronic) (peripheral): Secondary | ICD-10-CM | POA: Diagnosis not present

## 2019-01-21 DIAGNOSIS — G4733 Obstructive sleep apnea (adult) (pediatric): Secondary | ICD-10-CM | POA: Diagnosis not present

## 2019-01-21 DIAGNOSIS — N184 Chronic kidney disease, stage 4 (severe): Secondary | ICD-10-CM | POA: Diagnosis not present

## 2019-01-21 DIAGNOSIS — E1159 Type 2 diabetes mellitus with other circulatory complications: Secondary | ICD-10-CM | POA: Diagnosis not present

## 2019-01-21 DIAGNOSIS — L89152 Pressure ulcer of sacral region, stage 2: Secondary | ICD-10-CM | POA: Diagnosis not present

## 2019-01-21 DIAGNOSIS — I130 Hypertensive heart and chronic kidney disease with heart failure and stage 1 through stage 4 chronic kidney disease, or unspecified chronic kidney disease: Secondary | ICD-10-CM | POA: Diagnosis not present

## 2019-01-21 DIAGNOSIS — I5032 Chronic diastolic (congestive) heart failure: Secondary | ICD-10-CM | POA: Diagnosis not present

## 2019-01-21 DIAGNOSIS — E1122 Type 2 diabetes mellitus with diabetic chronic kidney disease: Secondary | ICD-10-CM | POA: Diagnosis not present

## 2019-01-24 ENCOUNTER — Ambulatory Visit: Payer: Medicare HMO | Admitting: Physician Assistant

## 2019-01-24 DIAGNOSIS — I872 Venous insufficiency (chronic) (peripheral): Secondary | ICD-10-CM | POA: Diagnosis not present

## 2019-01-24 DIAGNOSIS — Z794 Long term (current) use of insulin: Secondary | ICD-10-CM | POA: Diagnosis not present

## 2019-01-24 DIAGNOSIS — I5032 Chronic diastolic (congestive) heart failure: Secondary | ICD-10-CM | POA: Diagnosis not present

## 2019-01-24 DIAGNOSIS — L89152 Pressure ulcer of sacral region, stage 2: Secondary | ICD-10-CM | POA: Diagnosis not present

## 2019-01-24 DIAGNOSIS — G4733 Obstructive sleep apnea (adult) (pediatric): Secondary | ICD-10-CM | POA: Diagnosis not present

## 2019-01-24 DIAGNOSIS — E1159 Type 2 diabetes mellitus with other circulatory complications: Secondary | ICD-10-CM | POA: Diagnosis not present

## 2019-01-24 DIAGNOSIS — N184 Chronic kidney disease, stage 4 (severe): Secondary | ICD-10-CM | POA: Diagnosis not present

## 2019-01-24 DIAGNOSIS — I130 Hypertensive heart and chronic kidney disease with heart failure and stage 1 through stage 4 chronic kidney disease, or unspecified chronic kidney disease: Secondary | ICD-10-CM | POA: Diagnosis not present

## 2019-01-24 DIAGNOSIS — E1122 Type 2 diabetes mellitus with diabetic chronic kidney disease: Secondary | ICD-10-CM | POA: Diagnosis not present

## 2019-01-26 DIAGNOSIS — I130 Hypertensive heart and chronic kidney disease with heart failure and stage 1 through stage 4 chronic kidney disease, or unspecified chronic kidney disease: Secondary | ICD-10-CM | POA: Diagnosis not present

## 2019-01-26 DIAGNOSIS — E1159 Type 2 diabetes mellitus with other circulatory complications: Secondary | ICD-10-CM | POA: Diagnosis not present

## 2019-01-26 DIAGNOSIS — I872 Venous insufficiency (chronic) (peripheral): Secondary | ICD-10-CM | POA: Diagnosis not present

## 2019-01-26 DIAGNOSIS — E1122 Type 2 diabetes mellitus with diabetic chronic kidney disease: Secondary | ICD-10-CM | POA: Diagnosis not present

## 2019-01-26 DIAGNOSIS — G4733 Obstructive sleep apnea (adult) (pediatric): Secondary | ICD-10-CM | POA: Diagnosis not present

## 2019-01-26 DIAGNOSIS — I5032 Chronic diastolic (congestive) heart failure: Secondary | ICD-10-CM | POA: Diagnosis not present

## 2019-01-26 DIAGNOSIS — N184 Chronic kidney disease, stage 4 (severe): Secondary | ICD-10-CM | POA: Diagnosis not present

## 2019-01-26 DIAGNOSIS — Z794 Long term (current) use of insulin: Secondary | ICD-10-CM | POA: Diagnosis not present

## 2019-01-26 DIAGNOSIS — L89152 Pressure ulcer of sacral region, stage 2: Secondary | ICD-10-CM | POA: Diagnosis not present

## 2019-01-27 DIAGNOSIS — N184 Chronic kidney disease, stage 4 (severe): Secondary | ICD-10-CM | POA: Diagnosis not present

## 2019-01-27 DIAGNOSIS — E1159 Type 2 diabetes mellitus with other circulatory complications: Secondary | ICD-10-CM | POA: Diagnosis not present

## 2019-01-27 DIAGNOSIS — I872 Venous insufficiency (chronic) (peripheral): Secondary | ICD-10-CM | POA: Diagnosis not present

## 2019-01-27 DIAGNOSIS — G4733 Obstructive sleep apnea (adult) (pediatric): Secondary | ICD-10-CM | POA: Diagnosis not present

## 2019-01-27 DIAGNOSIS — Z794 Long term (current) use of insulin: Secondary | ICD-10-CM | POA: Diagnosis not present

## 2019-01-27 DIAGNOSIS — I130 Hypertensive heart and chronic kidney disease with heart failure and stage 1 through stage 4 chronic kidney disease, or unspecified chronic kidney disease: Secondary | ICD-10-CM | POA: Diagnosis not present

## 2019-01-27 DIAGNOSIS — L89152 Pressure ulcer of sacral region, stage 2: Secondary | ICD-10-CM | POA: Diagnosis not present

## 2019-01-27 DIAGNOSIS — I5032 Chronic diastolic (congestive) heart failure: Secondary | ICD-10-CM | POA: Diagnosis not present

## 2019-01-27 DIAGNOSIS — E1122 Type 2 diabetes mellitus with diabetic chronic kidney disease: Secondary | ICD-10-CM | POA: Diagnosis not present

## 2019-01-29 DIAGNOSIS — G4733 Obstructive sleep apnea (adult) (pediatric): Secondary | ICD-10-CM | POA: Diagnosis not present

## 2019-01-29 DIAGNOSIS — E1159 Type 2 diabetes mellitus with other circulatory complications: Secondary | ICD-10-CM | POA: Diagnosis not present

## 2019-01-29 DIAGNOSIS — E1122 Type 2 diabetes mellitus with diabetic chronic kidney disease: Secondary | ICD-10-CM | POA: Diagnosis not present

## 2019-01-29 DIAGNOSIS — N184 Chronic kidney disease, stage 4 (severe): Secondary | ICD-10-CM | POA: Diagnosis not present

## 2019-01-29 DIAGNOSIS — L89152 Pressure ulcer of sacral region, stage 2: Secondary | ICD-10-CM | POA: Diagnosis not present

## 2019-01-29 DIAGNOSIS — I5032 Chronic diastolic (congestive) heart failure: Secondary | ICD-10-CM | POA: Diagnosis not present

## 2019-01-29 DIAGNOSIS — I130 Hypertensive heart and chronic kidney disease with heart failure and stage 1 through stage 4 chronic kidney disease, or unspecified chronic kidney disease: Secondary | ICD-10-CM | POA: Diagnosis not present

## 2019-01-29 DIAGNOSIS — Z794 Long term (current) use of insulin: Secondary | ICD-10-CM | POA: Diagnosis not present

## 2019-01-29 DIAGNOSIS — I872 Venous insufficiency (chronic) (peripheral): Secondary | ICD-10-CM | POA: Diagnosis not present

## 2019-02-01 DIAGNOSIS — I130 Hypertensive heart and chronic kidney disease with heart failure and stage 1 through stage 4 chronic kidney disease, or unspecified chronic kidney disease: Secondary | ICD-10-CM | POA: Diagnosis not present

## 2019-02-01 DIAGNOSIS — I5032 Chronic diastolic (congestive) heart failure: Secondary | ICD-10-CM | POA: Diagnosis not present

## 2019-02-01 DIAGNOSIS — L89152 Pressure ulcer of sacral region, stage 2: Secondary | ICD-10-CM | POA: Diagnosis not present

## 2019-02-01 DIAGNOSIS — N184 Chronic kidney disease, stage 4 (severe): Secondary | ICD-10-CM | POA: Diagnosis not present

## 2019-02-01 DIAGNOSIS — Z794 Long term (current) use of insulin: Secondary | ICD-10-CM | POA: Diagnosis not present

## 2019-02-01 DIAGNOSIS — G4733 Obstructive sleep apnea (adult) (pediatric): Secondary | ICD-10-CM | POA: Diagnosis not present

## 2019-02-01 DIAGNOSIS — I872 Venous insufficiency (chronic) (peripheral): Secondary | ICD-10-CM | POA: Diagnosis not present

## 2019-02-01 DIAGNOSIS — E1159 Type 2 diabetes mellitus with other circulatory complications: Secondary | ICD-10-CM | POA: Diagnosis not present

## 2019-02-01 DIAGNOSIS — E1122 Type 2 diabetes mellitus with diabetic chronic kidney disease: Secondary | ICD-10-CM | POA: Diagnosis not present

## 2019-02-02 DIAGNOSIS — I872 Venous insufficiency (chronic) (peripheral): Secondary | ICD-10-CM | POA: Diagnosis not present

## 2019-02-02 DIAGNOSIS — Z794 Long term (current) use of insulin: Secondary | ICD-10-CM | POA: Diagnosis not present

## 2019-02-02 DIAGNOSIS — L89152 Pressure ulcer of sacral region, stage 2: Secondary | ICD-10-CM | POA: Diagnosis not present

## 2019-02-02 DIAGNOSIS — I130 Hypertensive heart and chronic kidney disease with heart failure and stage 1 through stage 4 chronic kidney disease, or unspecified chronic kidney disease: Secondary | ICD-10-CM | POA: Diagnosis not present

## 2019-02-02 DIAGNOSIS — N184 Chronic kidney disease, stage 4 (severe): Secondary | ICD-10-CM | POA: Diagnosis not present

## 2019-02-02 DIAGNOSIS — E1159 Type 2 diabetes mellitus with other circulatory complications: Secondary | ICD-10-CM | POA: Diagnosis not present

## 2019-02-02 DIAGNOSIS — E1122 Type 2 diabetes mellitus with diabetic chronic kidney disease: Secondary | ICD-10-CM | POA: Diagnosis not present

## 2019-02-02 DIAGNOSIS — I5032 Chronic diastolic (congestive) heart failure: Secondary | ICD-10-CM | POA: Diagnosis not present

## 2019-02-02 DIAGNOSIS — G4733 Obstructive sleep apnea (adult) (pediatric): Secondary | ICD-10-CM | POA: Diagnosis not present

## 2019-02-03 DIAGNOSIS — I130 Hypertensive heart and chronic kidney disease with heart failure and stage 1 through stage 4 chronic kidney disease, or unspecified chronic kidney disease: Secondary | ICD-10-CM | POA: Diagnosis not present

## 2019-02-03 DIAGNOSIS — E1159 Type 2 diabetes mellitus with other circulatory complications: Secondary | ICD-10-CM | POA: Diagnosis not present

## 2019-02-03 DIAGNOSIS — N184 Chronic kidney disease, stage 4 (severe): Secondary | ICD-10-CM | POA: Diagnosis not present

## 2019-02-03 DIAGNOSIS — I872 Venous insufficiency (chronic) (peripheral): Secondary | ICD-10-CM | POA: Diagnosis not present

## 2019-02-03 DIAGNOSIS — E1122 Type 2 diabetes mellitus with diabetic chronic kidney disease: Secondary | ICD-10-CM | POA: Diagnosis not present

## 2019-02-03 DIAGNOSIS — Z794 Long term (current) use of insulin: Secondary | ICD-10-CM | POA: Diagnosis not present

## 2019-02-03 DIAGNOSIS — L89152 Pressure ulcer of sacral region, stage 2: Secondary | ICD-10-CM | POA: Diagnosis not present

## 2019-02-03 DIAGNOSIS — I5032 Chronic diastolic (congestive) heart failure: Secondary | ICD-10-CM | POA: Diagnosis not present

## 2019-02-03 DIAGNOSIS — G4733 Obstructive sleep apnea (adult) (pediatric): Secondary | ICD-10-CM | POA: Diagnosis not present

## 2019-02-04 DIAGNOSIS — L89152 Pressure ulcer of sacral region, stage 2: Secondary | ICD-10-CM | POA: Diagnosis not present

## 2019-02-04 DIAGNOSIS — E1122 Type 2 diabetes mellitus with diabetic chronic kidney disease: Secondary | ICD-10-CM | POA: Diagnosis not present

## 2019-02-04 DIAGNOSIS — E1159 Type 2 diabetes mellitus with other circulatory complications: Secondary | ICD-10-CM | POA: Diagnosis not present

## 2019-02-04 DIAGNOSIS — Z794 Long term (current) use of insulin: Secondary | ICD-10-CM | POA: Diagnosis not present

## 2019-02-04 DIAGNOSIS — I5032 Chronic diastolic (congestive) heart failure: Secondary | ICD-10-CM | POA: Diagnosis not present

## 2019-02-04 DIAGNOSIS — G4733 Obstructive sleep apnea (adult) (pediatric): Secondary | ICD-10-CM | POA: Diagnosis not present

## 2019-02-04 DIAGNOSIS — I130 Hypertensive heart and chronic kidney disease with heart failure and stage 1 through stage 4 chronic kidney disease, or unspecified chronic kidney disease: Secondary | ICD-10-CM | POA: Diagnosis not present

## 2019-02-04 DIAGNOSIS — I872 Venous insufficiency (chronic) (peripheral): Secondary | ICD-10-CM | POA: Diagnosis not present

## 2019-02-04 DIAGNOSIS — N184 Chronic kidney disease, stage 4 (severe): Secondary | ICD-10-CM | POA: Diagnosis not present

## 2019-02-07 DIAGNOSIS — N184 Chronic kidney disease, stage 4 (severe): Secondary | ICD-10-CM | POA: Diagnosis not present

## 2019-02-07 DIAGNOSIS — L89152 Pressure ulcer of sacral region, stage 2: Secondary | ICD-10-CM | POA: Diagnosis not present

## 2019-02-07 DIAGNOSIS — E1122 Type 2 diabetes mellitus with diabetic chronic kidney disease: Secondary | ICD-10-CM | POA: Diagnosis not present

## 2019-02-07 DIAGNOSIS — E782 Mixed hyperlipidemia: Secondary | ICD-10-CM | POA: Diagnosis not present

## 2019-02-07 DIAGNOSIS — G4733 Obstructive sleep apnea (adult) (pediatric): Secondary | ICD-10-CM | POA: Diagnosis not present

## 2019-02-07 DIAGNOSIS — Z0001 Encounter for general adult medical examination with abnormal findings: Secondary | ICD-10-CM | POA: Diagnosis not present

## 2019-02-07 DIAGNOSIS — I151 Hypertension secondary to other renal disorders: Secondary | ICD-10-CM | POA: Diagnosis not present

## 2019-02-07 DIAGNOSIS — M1A3790 Chronic gout due to renal impairment, unspecified ankle and foot, without tophus (tophi): Secondary | ICD-10-CM | POA: Diagnosis not present

## 2019-02-07 DIAGNOSIS — I872 Venous insufficiency (chronic) (peripheral): Secondary | ICD-10-CM | POA: Diagnosis not present

## 2019-02-07 DIAGNOSIS — I5033 Acute on chronic diastolic (congestive) heart failure: Secondary | ICD-10-CM | POA: Diagnosis not present

## 2019-02-07 DIAGNOSIS — I5032 Chronic diastolic (congestive) heart failure: Secondary | ICD-10-CM | POA: Diagnosis not present

## 2019-02-07 DIAGNOSIS — E1159 Type 2 diabetes mellitus with other circulatory complications: Secondary | ICD-10-CM | POA: Diagnosis not present

## 2019-02-07 DIAGNOSIS — N1831 Chronic kidney disease, stage 3a: Secondary | ICD-10-CM | POA: Diagnosis not present

## 2019-02-07 DIAGNOSIS — I130 Hypertensive heart and chronic kidney disease with heart failure and stage 1 through stage 4 chronic kidney disease, or unspecified chronic kidney disease: Secondary | ICD-10-CM | POA: Diagnosis not present

## 2019-02-07 DIAGNOSIS — M069 Rheumatoid arthritis, unspecified: Secondary | ICD-10-CM | POA: Diagnosis not present

## 2019-02-07 DIAGNOSIS — Z794 Long term (current) use of insulin: Secondary | ICD-10-CM | POA: Diagnosis not present

## 2019-02-07 DIAGNOSIS — N2889 Other specified disorders of kidney and ureter: Secondary | ICD-10-CM | POA: Diagnosis not present

## 2019-02-08 DIAGNOSIS — G4733 Obstructive sleep apnea (adult) (pediatric): Secondary | ICD-10-CM | POA: Diagnosis not present

## 2019-02-08 DIAGNOSIS — I130 Hypertensive heart and chronic kidney disease with heart failure and stage 1 through stage 4 chronic kidney disease, or unspecified chronic kidney disease: Secondary | ICD-10-CM | POA: Diagnosis not present

## 2019-02-08 DIAGNOSIS — L89152 Pressure ulcer of sacral region, stage 2: Secondary | ICD-10-CM | POA: Diagnosis not present

## 2019-02-08 DIAGNOSIS — I872 Venous insufficiency (chronic) (peripheral): Secondary | ICD-10-CM | POA: Diagnosis not present

## 2019-02-08 DIAGNOSIS — E1159 Type 2 diabetes mellitus with other circulatory complications: Secondary | ICD-10-CM | POA: Diagnosis not present

## 2019-02-08 DIAGNOSIS — Z794 Long term (current) use of insulin: Secondary | ICD-10-CM | POA: Diagnosis not present

## 2019-02-08 DIAGNOSIS — N184 Chronic kidney disease, stage 4 (severe): Secondary | ICD-10-CM | POA: Diagnosis not present

## 2019-02-08 DIAGNOSIS — E1122 Type 2 diabetes mellitus with diabetic chronic kidney disease: Secondary | ICD-10-CM | POA: Diagnosis not present

## 2019-02-08 DIAGNOSIS — I5032 Chronic diastolic (congestive) heart failure: Secondary | ICD-10-CM | POA: Diagnosis not present

## 2019-02-09 DIAGNOSIS — I5032 Chronic diastolic (congestive) heart failure: Secondary | ICD-10-CM | POA: Diagnosis not present

## 2019-02-09 DIAGNOSIS — E1159 Type 2 diabetes mellitus with other circulatory complications: Secondary | ICD-10-CM | POA: Diagnosis not present

## 2019-02-09 DIAGNOSIS — E1122 Type 2 diabetes mellitus with diabetic chronic kidney disease: Secondary | ICD-10-CM | POA: Diagnosis not present

## 2019-02-09 DIAGNOSIS — E1165 Type 2 diabetes mellitus with hyperglycemia: Secondary | ICD-10-CM | POA: Diagnosis not present

## 2019-02-09 DIAGNOSIS — G4733 Obstructive sleep apnea (adult) (pediatric): Secondary | ICD-10-CM | POA: Diagnosis not present

## 2019-02-09 DIAGNOSIS — I130 Hypertensive heart and chronic kidney disease with heart failure and stage 1 through stage 4 chronic kidney disease, or unspecified chronic kidney disease: Secondary | ICD-10-CM | POA: Diagnosis not present

## 2019-02-09 DIAGNOSIS — N184 Chronic kidney disease, stage 4 (severe): Secondary | ICD-10-CM | POA: Diagnosis not present

## 2019-02-09 DIAGNOSIS — I872 Venous insufficiency (chronic) (peripheral): Secondary | ICD-10-CM | POA: Diagnosis not present

## 2019-02-09 DIAGNOSIS — Z794 Long term (current) use of insulin: Secondary | ICD-10-CM | POA: Diagnosis not present

## 2019-02-09 DIAGNOSIS — L89152 Pressure ulcer of sacral region, stage 2: Secondary | ICD-10-CM | POA: Diagnosis not present

## 2019-02-10 DIAGNOSIS — I130 Hypertensive heart and chronic kidney disease with heart failure and stage 1 through stage 4 chronic kidney disease, or unspecified chronic kidney disease: Secondary | ICD-10-CM | POA: Diagnosis not present

## 2019-02-10 DIAGNOSIS — G4733 Obstructive sleep apnea (adult) (pediatric): Secondary | ICD-10-CM | POA: Diagnosis not present

## 2019-02-10 DIAGNOSIS — Z794 Long term (current) use of insulin: Secondary | ICD-10-CM | POA: Diagnosis not present

## 2019-02-10 DIAGNOSIS — E1122 Type 2 diabetes mellitus with diabetic chronic kidney disease: Secondary | ICD-10-CM | POA: Diagnosis not present

## 2019-02-10 DIAGNOSIS — L89152 Pressure ulcer of sacral region, stage 2: Secondary | ICD-10-CM | POA: Diagnosis not present

## 2019-02-10 DIAGNOSIS — I872 Venous insufficiency (chronic) (peripheral): Secondary | ICD-10-CM | POA: Diagnosis not present

## 2019-02-10 DIAGNOSIS — E1159 Type 2 diabetes mellitus with other circulatory complications: Secondary | ICD-10-CM | POA: Diagnosis not present

## 2019-02-10 DIAGNOSIS — I5032 Chronic diastolic (congestive) heart failure: Secondary | ICD-10-CM | POA: Diagnosis not present

## 2019-02-10 DIAGNOSIS — N184 Chronic kidney disease, stage 4 (severe): Secondary | ICD-10-CM | POA: Diagnosis not present

## 2019-02-11 DIAGNOSIS — I5032 Chronic diastolic (congestive) heart failure: Secondary | ICD-10-CM | POA: Diagnosis not present

## 2019-02-11 DIAGNOSIS — L89152 Pressure ulcer of sacral region, stage 2: Secondary | ICD-10-CM | POA: Diagnosis not present

## 2019-02-11 DIAGNOSIS — I130 Hypertensive heart and chronic kidney disease with heart failure and stage 1 through stage 4 chronic kidney disease, or unspecified chronic kidney disease: Secondary | ICD-10-CM | POA: Diagnosis not present

## 2019-02-11 DIAGNOSIS — Z794 Long term (current) use of insulin: Secondary | ICD-10-CM | POA: Diagnosis not present

## 2019-02-11 DIAGNOSIS — N184 Chronic kidney disease, stage 4 (severe): Secondary | ICD-10-CM | POA: Diagnosis not present

## 2019-02-11 DIAGNOSIS — E1159 Type 2 diabetes mellitus with other circulatory complications: Secondary | ICD-10-CM | POA: Diagnosis not present

## 2019-02-11 DIAGNOSIS — E1122 Type 2 diabetes mellitus with diabetic chronic kidney disease: Secondary | ICD-10-CM | POA: Diagnosis not present

## 2019-02-11 DIAGNOSIS — I872 Venous insufficiency (chronic) (peripheral): Secondary | ICD-10-CM | POA: Diagnosis not present

## 2019-02-11 DIAGNOSIS — G4733 Obstructive sleep apnea (adult) (pediatric): Secondary | ICD-10-CM | POA: Diagnosis not present

## 2019-02-15 ENCOUNTER — Other Ambulatory Visit: Payer: Self-pay | Admitting: Internal Medicine

## 2019-02-15 DIAGNOSIS — L89152 Pressure ulcer of sacral region, stage 2: Secondary | ICD-10-CM | POA: Diagnosis not present

## 2019-02-15 DIAGNOSIS — E1122 Type 2 diabetes mellitus with diabetic chronic kidney disease: Secondary | ICD-10-CM | POA: Diagnosis not present

## 2019-02-15 DIAGNOSIS — I872 Venous insufficiency (chronic) (peripheral): Secondary | ICD-10-CM | POA: Diagnosis not present

## 2019-02-15 DIAGNOSIS — G4733 Obstructive sleep apnea (adult) (pediatric): Secondary | ICD-10-CM | POA: Diagnosis not present

## 2019-02-15 DIAGNOSIS — Z794 Long term (current) use of insulin: Secondary | ICD-10-CM | POA: Diagnosis not present

## 2019-02-15 DIAGNOSIS — N184 Chronic kidney disease, stage 4 (severe): Secondary | ICD-10-CM | POA: Diagnosis not present

## 2019-02-15 DIAGNOSIS — I5032 Chronic diastolic (congestive) heart failure: Secondary | ICD-10-CM | POA: Diagnosis not present

## 2019-02-15 DIAGNOSIS — I130 Hypertensive heart and chronic kidney disease with heart failure and stage 1 through stage 4 chronic kidney disease, or unspecified chronic kidney disease: Secondary | ICD-10-CM | POA: Diagnosis not present

## 2019-02-15 DIAGNOSIS — E1159 Type 2 diabetes mellitus with other circulatory complications: Secondary | ICD-10-CM | POA: Diagnosis not present

## 2019-02-18 DIAGNOSIS — I5032 Chronic diastolic (congestive) heart failure: Secondary | ICD-10-CM | POA: Diagnosis not present

## 2019-02-18 DIAGNOSIS — E1122 Type 2 diabetes mellitus with diabetic chronic kidney disease: Secondary | ICD-10-CM | POA: Diagnosis not present

## 2019-02-18 DIAGNOSIS — G4733 Obstructive sleep apnea (adult) (pediatric): Secondary | ICD-10-CM | POA: Diagnosis not present

## 2019-02-18 DIAGNOSIS — I130 Hypertensive heart and chronic kidney disease with heart failure and stage 1 through stage 4 chronic kidney disease, or unspecified chronic kidney disease: Secondary | ICD-10-CM | POA: Diagnosis not present

## 2019-02-18 DIAGNOSIS — I872 Venous insufficiency (chronic) (peripheral): Secondary | ICD-10-CM | POA: Diagnosis not present

## 2019-02-18 DIAGNOSIS — Z794 Long term (current) use of insulin: Secondary | ICD-10-CM | POA: Diagnosis not present

## 2019-02-18 DIAGNOSIS — L89152 Pressure ulcer of sacral region, stage 2: Secondary | ICD-10-CM | POA: Diagnosis not present

## 2019-02-18 DIAGNOSIS — N184 Chronic kidney disease, stage 4 (severe): Secondary | ICD-10-CM | POA: Diagnosis not present

## 2019-02-18 DIAGNOSIS — E1159 Type 2 diabetes mellitus with other circulatory complications: Secondary | ICD-10-CM | POA: Diagnosis not present

## 2019-02-22 DIAGNOSIS — L89152 Pressure ulcer of sacral region, stage 2: Secondary | ICD-10-CM | POA: Diagnosis not present

## 2019-02-22 DIAGNOSIS — E1159 Type 2 diabetes mellitus with other circulatory complications: Secondary | ICD-10-CM | POA: Diagnosis not present

## 2019-02-22 DIAGNOSIS — N184 Chronic kidney disease, stage 4 (severe): Secondary | ICD-10-CM | POA: Diagnosis not present

## 2019-02-22 DIAGNOSIS — I130 Hypertensive heart and chronic kidney disease with heart failure and stage 1 through stage 4 chronic kidney disease, or unspecified chronic kidney disease: Secondary | ICD-10-CM | POA: Diagnosis not present

## 2019-02-22 DIAGNOSIS — I872 Venous insufficiency (chronic) (peripheral): Secondary | ICD-10-CM | POA: Diagnosis not present

## 2019-02-22 DIAGNOSIS — G4733 Obstructive sleep apnea (adult) (pediatric): Secondary | ICD-10-CM | POA: Diagnosis not present

## 2019-02-22 DIAGNOSIS — Z794 Long term (current) use of insulin: Secondary | ICD-10-CM | POA: Diagnosis not present

## 2019-02-22 DIAGNOSIS — E1122 Type 2 diabetes mellitus with diabetic chronic kidney disease: Secondary | ICD-10-CM | POA: Diagnosis not present

## 2019-02-22 DIAGNOSIS — I5032 Chronic diastolic (congestive) heart failure: Secondary | ICD-10-CM | POA: Diagnosis not present

## 2019-02-28 DIAGNOSIS — E1159 Type 2 diabetes mellitus with other circulatory complications: Secondary | ICD-10-CM | POA: Diagnosis not present

## 2019-02-28 DIAGNOSIS — I5033 Acute on chronic diastolic (congestive) heart failure: Secondary | ICD-10-CM | POA: Diagnosis not present

## 2019-02-28 DIAGNOSIS — Z794 Long term (current) use of insulin: Secondary | ICD-10-CM | POA: Diagnosis not present

## 2019-02-28 DIAGNOSIS — Z79899 Other long term (current) drug therapy: Secondary | ICD-10-CM | POA: Diagnosis not present

## 2019-02-28 DIAGNOSIS — I151 Hypertension secondary to other renal disorders: Secondary | ICD-10-CM | POA: Diagnosis not present

## 2019-03-01 DIAGNOSIS — I872 Venous insufficiency (chronic) (peripheral): Secondary | ICD-10-CM | POA: Diagnosis not present

## 2019-03-01 DIAGNOSIS — G4733 Obstructive sleep apnea (adult) (pediatric): Secondary | ICD-10-CM | POA: Diagnosis not present

## 2019-03-01 DIAGNOSIS — L89152 Pressure ulcer of sacral region, stage 2: Secondary | ICD-10-CM | POA: Diagnosis not present

## 2019-03-01 DIAGNOSIS — I5032 Chronic diastolic (congestive) heart failure: Secondary | ICD-10-CM | POA: Diagnosis not present

## 2019-03-01 DIAGNOSIS — N184 Chronic kidney disease, stage 4 (severe): Secondary | ICD-10-CM | POA: Diagnosis not present

## 2019-03-01 DIAGNOSIS — I130 Hypertensive heart and chronic kidney disease with heart failure and stage 1 through stage 4 chronic kidney disease, or unspecified chronic kidney disease: Secondary | ICD-10-CM | POA: Diagnosis not present

## 2019-03-01 DIAGNOSIS — E1159 Type 2 diabetes mellitus with other circulatory complications: Secondary | ICD-10-CM | POA: Diagnosis not present

## 2019-03-01 DIAGNOSIS — Z794 Long term (current) use of insulin: Secondary | ICD-10-CM | POA: Diagnosis not present

## 2019-03-01 DIAGNOSIS — E1122 Type 2 diabetes mellitus with diabetic chronic kidney disease: Secondary | ICD-10-CM | POA: Diagnosis not present

## 2019-03-03 DIAGNOSIS — L89152 Pressure ulcer of sacral region, stage 2: Secondary | ICD-10-CM | POA: Diagnosis not present

## 2019-03-03 DIAGNOSIS — I872 Venous insufficiency (chronic) (peripheral): Secondary | ICD-10-CM | POA: Diagnosis not present

## 2019-03-03 DIAGNOSIS — N184 Chronic kidney disease, stage 4 (severe): Secondary | ICD-10-CM | POA: Diagnosis not present

## 2019-03-03 DIAGNOSIS — E1159 Type 2 diabetes mellitus with other circulatory complications: Secondary | ICD-10-CM | POA: Diagnosis not present

## 2019-03-03 DIAGNOSIS — E1122 Type 2 diabetes mellitus with diabetic chronic kidney disease: Secondary | ICD-10-CM | POA: Diagnosis not present

## 2019-03-03 DIAGNOSIS — I5032 Chronic diastolic (congestive) heart failure: Secondary | ICD-10-CM | POA: Diagnosis not present

## 2019-03-03 DIAGNOSIS — G4733 Obstructive sleep apnea (adult) (pediatric): Secondary | ICD-10-CM | POA: Diagnosis not present

## 2019-03-03 DIAGNOSIS — Z794 Long term (current) use of insulin: Secondary | ICD-10-CM | POA: Diagnosis not present

## 2019-03-03 DIAGNOSIS — I130 Hypertensive heart and chronic kidney disease with heart failure and stage 1 through stage 4 chronic kidney disease, or unspecified chronic kidney disease: Secondary | ICD-10-CM | POA: Diagnosis not present

## 2019-03-04 DIAGNOSIS — Z20828 Contact with and (suspected) exposure to other viral communicable diseases: Secondary | ICD-10-CM | POA: Diagnosis not present

## 2019-03-07 DIAGNOSIS — Z712 Person consulting for explanation of examination or test findings: Secondary | ICD-10-CM | POA: Diagnosis not present

## 2019-03-07 DIAGNOSIS — E1159 Type 2 diabetes mellitus with other circulatory complications: Secondary | ICD-10-CM | POA: Diagnosis not present

## 2019-03-07 DIAGNOSIS — I151 Hypertension secondary to other renal disorders: Secondary | ICD-10-CM | POA: Diagnosis not present

## 2019-03-07 DIAGNOSIS — Z794 Long term (current) use of insulin: Secondary | ICD-10-CM | POA: Diagnosis not present

## 2019-03-07 DIAGNOSIS — E782 Mixed hyperlipidemia: Secondary | ICD-10-CM | POA: Diagnosis not present

## 2019-03-07 DIAGNOSIS — Z79899 Other long term (current) drug therapy: Secondary | ICD-10-CM | POA: Diagnosis not present

## 2019-03-07 DIAGNOSIS — N1831 Chronic kidney disease, stage 3a: Secondary | ICD-10-CM | POA: Diagnosis not present

## 2019-03-09 DIAGNOSIS — N184 Chronic kidney disease, stage 4 (severe): Secondary | ICD-10-CM | POA: Diagnosis not present

## 2019-03-09 DIAGNOSIS — E1122 Type 2 diabetes mellitus with diabetic chronic kidney disease: Secondary | ICD-10-CM | POA: Diagnosis not present

## 2019-03-09 DIAGNOSIS — I130 Hypertensive heart and chronic kidney disease with heart failure and stage 1 through stage 4 chronic kidney disease, or unspecified chronic kidney disease: Secondary | ICD-10-CM | POA: Diagnosis not present

## 2019-03-09 DIAGNOSIS — E1159 Type 2 diabetes mellitus with other circulatory complications: Secondary | ICD-10-CM | POA: Diagnosis not present

## 2019-03-09 DIAGNOSIS — I5032 Chronic diastolic (congestive) heart failure: Secondary | ICD-10-CM | POA: Diagnosis not present

## 2019-03-09 DIAGNOSIS — G4733 Obstructive sleep apnea (adult) (pediatric): Secondary | ICD-10-CM | POA: Diagnosis not present

## 2019-03-09 DIAGNOSIS — L89152 Pressure ulcer of sacral region, stage 2: Secondary | ICD-10-CM | POA: Diagnosis not present

## 2019-03-09 DIAGNOSIS — I872 Venous insufficiency (chronic) (peripheral): Secondary | ICD-10-CM | POA: Diagnosis not present

## 2019-03-09 DIAGNOSIS — Z794 Long term (current) use of insulin: Secondary | ICD-10-CM | POA: Diagnosis not present

## 2019-03-28 DIAGNOSIS — Z20828 Contact with and (suspected) exposure to other viral communicable diseases: Secondary | ICD-10-CM | POA: Diagnosis not present

## 2019-03-28 DIAGNOSIS — I5033 Acute on chronic diastolic (congestive) heart failure: Secondary | ICD-10-CM | POA: Diagnosis not present

## 2019-03-28 DIAGNOSIS — I151 Hypertension secondary to other renal disorders: Secondary | ICD-10-CM | POA: Diagnosis not present

## 2019-03-28 DIAGNOSIS — W19XXXA Unspecified fall, initial encounter: Secondary | ICD-10-CM | POA: Diagnosis not present

## 2019-03-28 DIAGNOSIS — E1159 Type 2 diabetes mellitus with other circulatory complications: Secondary | ICD-10-CM | POA: Diagnosis not present

## 2019-03-28 DIAGNOSIS — Z79899 Other long term (current) drug therapy: Secondary | ICD-10-CM | POA: Diagnosis not present

## 2019-03-28 DIAGNOSIS — Z794 Long term (current) use of insulin: Secondary | ICD-10-CM | POA: Diagnosis not present

## 2019-03-30 DIAGNOSIS — Z1159 Encounter for screening for other viral diseases: Secondary | ICD-10-CM | POA: Diagnosis not present

## 2019-03-30 DIAGNOSIS — Z20828 Contact with and (suspected) exposure to other viral communicable diseases: Secondary | ICD-10-CM | POA: Diagnosis not present

## 2019-04-04 DIAGNOSIS — Z20828 Contact with and (suspected) exposure to other viral communicable diseases: Secondary | ICD-10-CM | POA: Diagnosis not present

## 2019-04-04 DIAGNOSIS — Z1159 Encounter for screening for other viral diseases: Secondary | ICD-10-CM | POA: Diagnosis not present

## 2019-04-05 DIAGNOSIS — E1165 Type 2 diabetes mellitus with hyperglycemia: Secondary | ICD-10-CM | POA: Diagnosis not present

## 2019-04-11 DIAGNOSIS — Z20828 Contact with and (suspected) exposure to other viral communicable diseases: Secondary | ICD-10-CM | POA: Diagnosis not present

## 2019-04-11 DIAGNOSIS — Z1159 Encounter for screening for other viral diseases: Secondary | ICD-10-CM | POA: Diagnosis not present

## 2019-04-21 ENCOUNTER — Other Ambulatory Visit: Payer: Self-pay | Admitting: Internal Medicine

## 2019-05-02 DIAGNOSIS — W19XXXD Unspecified fall, subsequent encounter: Secondary | ICD-10-CM | POA: Diagnosis not present

## 2019-05-02 DIAGNOSIS — Z79899 Other long term (current) drug therapy: Secondary | ICD-10-CM | POA: Diagnosis not present

## 2019-05-02 DIAGNOSIS — E1159 Type 2 diabetes mellitus with other circulatory complications: Secondary | ICD-10-CM | POA: Diagnosis not present

## 2019-05-02 DIAGNOSIS — I10 Essential (primary) hypertension: Secondary | ICD-10-CM | POA: Diagnosis not present

## 2019-05-02 DIAGNOSIS — Z794 Long term (current) use of insulin: Secondary | ICD-10-CM | POA: Diagnosis not present

## 2019-05-06 DIAGNOSIS — E1165 Type 2 diabetes mellitus with hyperglycemia: Secondary | ICD-10-CM | POA: Diagnosis not present

## 2019-06-21 ENCOUNTER — Other Ambulatory Visit: Payer: Self-pay | Admitting: Physician Assistant

## 2019-08-02 ENCOUNTER — Other Ambulatory Visit: Payer: Self-pay | Admitting: Physician Assistant

## 2019-12-23 ENCOUNTER — Encounter: Payer: Self-pay | Admitting: Internal Medicine

## 2020-09-25 ENCOUNTER — Other Ambulatory Visit: Payer: Self-pay | Admitting: Orthopedic Surgery

## 2020-09-25 ENCOUNTER — Other Ambulatory Visit: Payer: Self-pay | Admitting: Student

## 2020-09-25 ENCOUNTER — Other Ambulatory Visit (HOSPITAL_COMMUNITY): Payer: Self-pay | Admitting: Student

## 2020-09-25 DIAGNOSIS — D039 Melanoma in situ, unspecified: Secondary | ICD-10-CM

## 2020-10-02 ENCOUNTER — Encounter (HOSPITAL_COMMUNITY): Payer: Self-pay

## 2020-10-02 ENCOUNTER — Ambulatory Visit (HOSPITAL_COMMUNITY): Payer: Medicare HMO

## 2020-10-04 ENCOUNTER — Ambulatory Visit (HOSPITAL_COMMUNITY)
Admission: RE | Admit: 2020-10-04 | Discharge: 2020-10-04 | Disposition: A | Discharge status: Home / Self Care | Payer: Medicare HMO | Source: Ambulatory Visit | Attending: Student | Admitting: Student

## 2020-10-04 ENCOUNTER — Other Ambulatory Visit: Payer: Self-pay

## 2020-10-04 DIAGNOSIS — D039 Melanoma in situ, unspecified: Secondary | ICD-10-CM | POA: Insufficient documentation

## 2020-10-04 MED ORDER — GADOBUTROL 1 MMOL/ML IV SOLN: 10.0000 mL | Freq: Once | INTRAVENOUS | Status: DC | PRN

## 2020-10-04 MED ORDER — GADOBUTROL 1 MMOL/ML IV SOLN
10.0000 mL | Freq: Once | INTRAVENOUS | Status: AC | PRN
  Administered 2020-10-04: 10 mL via INTRAVENOUS

## 2020-10-06 ENCOUNTER — Encounter: Payer: Self-pay | Admitting: Oral Surgery

## 2020-10-10 ENCOUNTER — Other Ambulatory Visit: Payer: Self-pay

## 2020-10-10 ENCOUNTER — Ambulatory Visit (HOSPITAL_COMMUNITY)
Admission: RE | Admit: 2020-10-10 | Discharge: 2020-10-10 | Disposition: A | Discharge status: Home / Self Care | Payer: Medicare HMO | Source: Ambulatory Visit | Attending: Student | Admitting: Student

## 2020-10-10 DIAGNOSIS — D039 Melanoma in situ, unspecified: Secondary | ICD-10-CM | POA: Insufficient documentation

## 2020-10-10 LAB — GLUCOSE, CAPILLARY: Glucose-Capillary: 184 mg/dL — ABNORMAL HIGH (ref 70–99)

## 2020-10-10 MED ORDER — FLUDEOXYGLUCOSE F - 18 (FDG) INJECTION
9.0000 | Freq: Once | INTRAVENOUS | Status: AC | PRN
  Administered 2020-10-10: 10.92 via INTRAVENOUS

## 2021-02-13 ENCOUNTER — Emergency Department (HOSPITAL_COMMUNITY): Payer: Medicare HMO

## 2021-02-13 ENCOUNTER — Encounter (HOSPITAL_COMMUNITY): Payer: Self-pay | Admitting: Emergency Medicine

## 2021-02-13 ENCOUNTER — Other Ambulatory Visit: Payer: Self-pay

## 2021-02-13 ENCOUNTER — Observation Stay (HOSPITAL_COMMUNITY): Payer: Medicare HMO

## 2021-02-13 ENCOUNTER — Observation Stay (HOSPITAL_COMMUNITY)
Admission: EM | Admit: 2021-02-13 | Discharge: 2021-02-14 | Disposition: A | Discharge status: Home / Self Care | Payer: Medicare HMO | Attending: Internal Medicine | Admitting: Internal Medicine

## 2021-02-13 DIAGNOSIS — I5032 Chronic diastolic (congestive) heart failure: Secondary | ICD-10-CM | POA: Insufficient documentation

## 2021-02-13 DIAGNOSIS — N1832 Chronic kidney disease, stage 3b: Secondary | ICD-10-CM | POA: Insufficient documentation

## 2021-02-13 DIAGNOSIS — R109 Unspecified abdominal pain: Secondary | ICD-10-CM

## 2021-02-13 DIAGNOSIS — Z79899 Other long term (current) drug therapy: Secondary | ICD-10-CM | POA: Insufficient documentation

## 2021-02-13 DIAGNOSIS — Z794 Long term (current) use of insulin: Secondary | ICD-10-CM | POA: Insufficient documentation

## 2021-02-13 DIAGNOSIS — G9341 Metabolic encephalopathy: Principal | ICD-10-CM | POA: Insufficient documentation

## 2021-02-13 DIAGNOSIS — N183 Chronic kidney disease, stage 3 unspecified: Secondary | ICD-10-CM | POA: Diagnosis present

## 2021-02-13 DIAGNOSIS — R1012 Left upper quadrant pain: Secondary | ICD-10-CM | POA: Diagnosis present

## 2021-02-13 DIAGNOSIS — D631 Anemia in chronic kidney disease: Secondary | ICD-10-CM | POA: Diagnosis not present

## 2021-02-13 DIAGNOSIS — Z7982 Long term (current) use of aspirin: Secondary | ICD-10-CM | POA: Diagnosis not present

## 2021-02-13 DIAGNOSIS — E119 Type 2 diabetes mellitus without complications: Secondary | ICD-10-CM

## 2021-02-13 DIAGNOSIS — N19 Unspecified kidney failure: Secondary | ICD-10-CM

## 2021-02-13 DIAGNOSIS — E1122 Type 2 diabetes mellitus with diabetic chronic kidney disease: Secondary | ICD-10-CM | POA: Diagnosis not present

## 2021-02-13 DIAGNOSIS — N184 Chronic kidney disease, stage 4 (severe): Secondary | ICD-10-CM | POA: Diagnosis present

## 2021-02-13 DIAGNOSIS — R4182 Altered mental status, unspecified: Secondary | ICD-10-CM | POA: Insufficient documentation

## 2021-02-13 DIAGNOSIS — Z20822 Contact with and (suspected) exposure to covid-19: Secondary | ICD-10-CM | POA: Insufficient documentation

## 2021-02-13 DIAGNOSIS — E160 Drug-induced hypoglycemia without coma: Secondary | ICD-10-CM

## 2021-02-13 DIAGNOSIS — R911 Solitary pulmonary nodule: Secondary | ICD-10-CM | POA: Diagnosis present

## 2021-02-13 DIAGNOSIS — Z96652 Presence of left artificial knee joint: Secondary | ICD-10-CM | POA: Diagnosis not present

## 2021-02-13 DIAGNOSIS — N179 Acute kidney failure, unspecified: Secondary | ICD-10-CM | POA: Diagnosis present

## 2021-02-13 DIAGNOSIS — T383X5A Adverse effect of insulin and oral hypoglycemic [antidiabetic] drugs, initial encounter: Secondary | ICD-10-CM

## 2021-02-13 DIAGNOSIS — L89152 Pressure ulcer of sacral region, stage 2: Secondary | ICD-10-CM | POA: Diagnosis present

## 2021-02-13 DIAGNOSIS — K5900 Constipation, unspecified: Secondary | ICD-10-CM | POA: Insufficient documentation

## 2021-02-13 DIAGNOSIS — I130 Hypertensive heart and chronic kidney disease with heart failure and stage 1 through stage 4 chronic kidney disease, or unspecified chronic kidney disease: Secondary | ICD-10-CM | POA: Diagnosis not present

## 2021-02-13 DIAGNOSIS — Z85820 Personal history of malignant melanoma of skin: Secondary | ICD-10-CM

## 2021-02-13 LAB — HEMOGLOBIN A1C
Hgb A1c MFr Bld: 7.5 % — ABNORMAL HIGH (ref 4.8–5.6)
Mean Plasma Glucose: 168.55 mg/dL

## 2021-02-13 LAB — COMPREHENSIVE METABOLIC PANEL
ALT: 24 U/L (ref 0–44)
AST: 27 U/L (ref 15–41)
Albumin: 3.3 g/dL — ABNORMAL LOW (ref 3.5–5.0)
Alkaline Phosphatase: 74 U/L (ref 38–126)
Anion gap: 10 (ref 5–15)
BUN: 60 mg/dL — ABNORMAL HIGH (ref 8–23)
CO2: 22 mmol/L (ref 22–32)
Calcium: 8.8 mg/dL — ABNORMAL LOW (ref 8.9–10.3)
Chloride: 105 mmol/L (ref 98–111)
Creatinine, Ser: 1.8 mg/dL — ABNORMAL HIGH (ref 0.44–1.00)
GFR, Estimated: 27 mL/min — ABNORMAL LOW (ref >60)
Glucose, Bld: 184 mg/dL — ABNORMAL HIGH (ref 70–99)
Potassium: 4.7 mmol/L (ref 3.5–5.1)
Sodium: 137 mmol/L (ref 135–145)
Total Bilirubin: 0.7 mg/dL (ref 0.3–1.2)
Total Protein: 6.5 g/dL (ref 6.5–8.1)

## 2021-02-13 LAB — URINALYSIS, ROUTINE W REFLEX MICROSCOPIC
Bilirubin Urine: NEGATIVE
Glucose, UA: NEGATIVE mg/dL
Hgb urine dipstick: NEGATIVE
Ketones, ur: NEGATIVE mg/dL
Nitrite: POSITIVE — AB
Protein, ur: NEGATIVE mg/dL
Specific Gravity, Urine: 1.013 (ref 1.005–1.030)
pH: 5 (ref 5.0–8.0)

## 2021-02-13 LAB — CBC
HCT: 36.2 % (ref 36.0–46.0)
Hemoglobin: 11.2 g/dL — ABNORMAL LOW (ref 12.0–15.0)
MCH: 30.1 pg (ref 26.0–34.0)
MCHC: 30.9 g/dL (ref 30.0–36.0)
MCV: 97.3 fL (ref 80.0–100.0)
Platelets: 150 10*3/uL (ref 150–400)
RBC: 3.72 MIL/uL — ABNORMAL LOW (ref 3.87–5.11)
RDW: 15.1 % (ref 11.5–15.5)
WBC: 9.5 10*3/uL (ref 4.0–10.5)
nRBC: 0 % (ref 0.0–0.2)

## 2021-02-13 LAB — RESP PANEL BY RT-PCR (FLU A&B, COVID) ARPGX2
Influenza A by PCR: NEGATIVE
Influenza B by PCR: NEGATIVE
SARS Coronavirus 2 by RT PCR: NEGATIVE

## 2021-02-13 LAB — LIPASE, BLOOD: Lipase: 27 U/L (ref 11–51)

## 2021-02-13 LAB — GLUCOSE, CAPILLARY: Glucose-Capillary: 202 mg/dL — ABNORMAL HIGH (ref 70–99)

## 2021-02-13 LAB — CBG MONITORING, ED
Glucose-Capillary: 107 mg/dL — ABNORMAL HIGH (ref 70–99)
Glucose-Capillary: 108 mg/dL — ABNORMAL HIGH (ref 70–99)

## 2021-02-13 LAB — TROPONIN I (HIGH SENSITIVITY)
Troponin I (High Sensitivity): 19 ng/L — ABNORMAL HIGH (ref <18)
Troponin I (High Sensitivity): 20 ng/L — ABNORMAL HIGH (ref <18)

## 2021-02-13 MED ORDER — CARVEDILOL PHOSPHATE ER 20 MG PO CP24
80.0000 mg | ORAL_CAPSULE | Freq: Every day | ORAL | Status: DC
  Administered 2021-02-13 – 2021-02-14 (×2): 80 mg via ORAL
  Filled 2021-02-13: qty 4
  Filled 2021-02-13: qty 1
  Filled 2021-02-13: qty 4

## 2021-02-13 MED ORDER — FOSFOMYCIN TROMETHAMINE 3 G PO PACK
3.0000 g | PACK | Freq: Once | ORAL | Status: AC
  Administered 2021-02-13: 3 g via ORAL
  Filled 2021-02-13 (×2): qty 3

## 2021-02-13 MED ORDER — INSULIN ASPART 100 UNIT/ML IJ SOLN
0.0000 [IU] | Freq: Three times a day (TID) | INTRAMUSCULAR | Status: DC
  Administered 2021-02-14 (×2): 3 [IU] via SUBCUTANEOUS
  Administered 2021-02-14: 8 [IU] via SUBCUTANEOUS

## 2021-02-13 MED ORDER — POLYETHYLENE GLYCOL 3350 17 G PO PACK
17.0000 g | PACK | Freq: Two times a day (BID) | ORAL | Status: DC
  Administered 2021-02-13 – 2021-02-14 (×3): 17 g via ORAL
  Filled 2021-02-13 (×3): qty 1

## 2021-02-13 MED ORDER — ALLOPURINOL 100 MG PO TABS
100.0000 mg | ORAL_TABLET | Freq: Every day | ORAL | Status: DC
  Administered 2021-02-13 – 2021-02-14 (×2): 100 mg via ORAL
  Filled 2021-02-13 (×2): qty 1

## 2021-02-13 MED ORDER — SENNOSIDES-DOCUSATE SODIUM 8.6-50 MG PO TABS
2.0000 | ORAL_TABLET | Freq: Two times a day (BID) | ORAL | Status: DC
  Administered 2021-02-13 – 2021-02-14 (×3): 2 via ORAL
  Filled 2021-02-13 (×3): qty 2

## 2021-02-13 MED ORDER — VITAMIN D 25 MCG (1000 UNIT) PO TABS
2000.0000 [IU] | ORAL_TABLET | Freq: Every day | ORAL | Status: DC
  Administered 2021-02-13 – 2021-02-14 (×2): 2000 [IU] via ORAL
  Filled 2021-02-13 (×3): qty 2

## 2021-02-13 MED ORDER — EZETIMIBE 10 MG PO TABS
10.0000 mg | ORAL_TABLET | Freq: Every day | ORAL | Status: DC
  Administered 2021-02-13: 10 mg via ORAL
  Filled 2021-02-13: qty 1

## 2021-02-13 MED ORDER — MAGNESIUM OXIDE -MG SUPPLEMENT 400 (240 MG) MG PO TABS
400.0000 mg | ORAL_TABLET | Freq: Every day | ORAL | Status: DC
  Administered 2021-02-13 – 2021-02-14 (×2): 400 mg via ORAL
  Filled 2021-02-13 (×2): qty 1

## 2021-02-13 MED ORDER — SIMVASTATIN 20 MG PO TABS
40.0000 mg | ORAL_TABLET | Freq: Every day | ORAL | Status: DC
  Administered 2021-02-13: 40 mg via ORAL
  Filled 2021-02-13: qty 2

## 2021-02-13 MED ORDER — ACETAMINOPHEN 325 MG PO TABS
650.0000 mg | ORAL_TABLET | Freq: Four times a day (QID) | ORAL | Status: DC | PRN
  Administered 2021-02-14: 650 mg via ORAL
  Filled 2021-02-13: qty 2

## 2021-02-13 MED ORDER — SODIUM CHLORIDE 0.9 % IV BOLUS
500.0000 mL | Freq: Once | INTRAVENOUS | Status: AC
  Administered 2021-02-13: 500 mL via INTRAVENOUS

## 2021-02-13 MED ORDER — INSULIN GLARGINE-YFGN 100 UNIT/ML ~~LOC~~ SOLN
20.0000 [IU] | Freq: Every day | SUBCUTANEOUS | Status: DC
  Administered 2021-02-13: 20 [IU] via SUBCUTANEOUS
  Filled 2021-02-13 (×2): qty 0.2

## 2021-02-13 MED ORDER — DULOXETINE HCL 30 MG PO CPEP
30.0000 mg | ORAL_CAPSULE | ORAL | Status: DC
  Administered 2021-02-14: 30 mg via ORAL
  Filled 2021-02-13: qty 1

## 2021-02-13 MED ORDER — LACTATED RINGERS IV BOLUS
1000.0000 mL | Freq: Once | INTRAVENOUS | Status: AC
  Administered 2021-02-13: 1000 mL via INTRAVENOUS

## 2021-02-13 MED ORDER — HEPARIN SODIUM (PORCINE) 5000 UNIT/ML IJ SOLN
5000.0000 [IU] | Freq: Three times a day (TID) | INTRAMUSCULAR | Status: DC
  Administered 2021-02-13 – 2021-02-14 (×5): 5000 [IU] via SUBCUTANEOUS
  Filled 2021-02-13 (×7): qty 1

## 2021-02-13 MED ORDER — LACTATED RINGERS IV SOLN: INTRAVENOUS | Status: AC

## 2021-02-13 MED ORDER — EZETIMIBE-SIMVASTATIN 10-40 MG PO TABS
1.0000 | ORAL_TABLET | Freq: Every day | ORAL | Status: DC
  Filled 2021-02-13 (×2): qty 1

## 2021-02-13 MED ORDER — OXYBUTYNIN CHLORIDE 5 MG PO TABS
2.5000 mg | ORAL_TABLET | Freq: Two times a day (BID) | ORAL | Status: DC
  Administered 2021-02-13 – 2021-02-14 (×3): 2.5 mg via ORAL
  Filled 2021-02-13 (×3): qty 1

## 2021-02-13 MED ORDER — ASPIRIN EC 81 MG PO TBEC
81.0000 mg | DELAYED_RELEASE_TABLET | Freq: Every day | ORAL | Status: DC
  Administered 2021-02-13 – 2021-02-14 (×2): 81 mg via ORAL
  Filled 2021-02-13 (×2): qty 1

## 2021-02-13 NOTE — ED Notes (Signed)
Breakfast tray ordered 

## 2021-02-13 NOTE — ED Notes (Signed)
Patient transported to CT scan . 

## 2021-02-13 NOTE — ED Triage Notes (Addendum)
Patient arrived EMS from [facility] reports LUQ pain onset 2am this morning , no emesis or diarrhea . Staff reported fall yesterday.

## 2021-02-13 NOTE — H&P (Addendum)
NAME:  Brandi Melton, MRN:  [id number], DOB:  04/01/1931, LOS: 0 ADMISSION DATE:  02/13/2021, Primary: No primary care provider on file.  CHIEF COMPLAINT:  altered mental status, abdominal pain    Medical Service: Internal Medicine Teaching Service         Attending Physician: Dr. Jimmye Norman, Elaina Pattee, MD    First Contact: Dr. Ileene Musa Pager: [phone]  Second Contact: Dr. Coy Saunas Pager: [phone]       After Hours (After 5p/  First Contact Pager: [phone]  weekends / holidays): Second Contact Pager: [phone]    History of present illness   87 year old female with pertinent history of CKD stage IIIb, CHF, and stage IIb melanoma of the right breast status post surgical excision with clear margins (10/2020) who presented from [place] [facility] at [facility] today for abdominal pain and acute change in mental status.  History taking is limited by patient's acute encephalopathy. She endorses left lower quadrant pain however she is unable to state when this started or further characterize it.  I spoke with her daughter who serves as POA as well as her son-in-law.  They note that her mentation is fairly sound at baseline.  Furthermore they note that she is fairly functional at baseline, and is able to walk with the aid of a walker.  Past Medical History  She,  has a past medical history of Arthritis, Chronic diastolic (congestive) heart failure ([place]), CKD (chronic kidney disease), stage III ([place]), Depression, Dyspnea, H/O cardiac catheterization, Hyperlipemia, Hypertensive heart disease, Morbid obesity ([place]), Palsy, Bell's, RA (rheumatoid arthritis) ([place]), Sleep apnea, and Type II diabetes mellitus ([place]).   Home Medications     Prior to Admission medications   Medication Sig Start Date End Date Taking? Authorizing Provider  ACCU-CHEK AVIVA PLUS test strip TEST BS QID 12/24/17   [provider]  allopurinol (ZYLOPRIM) 300 MG tablet Take 300 mg by mouth daily. 10/19/18   [provider]   aspirin EC 81 MG tablet Take 81 mg by mouth daily.    [provider]  calcium carbonate (TUMS EX) 750 MG chewable tablet Chew 1 tablet by mouth daily.    [provider]  carvedilol (COREG CR) 80 MG 24 hr capsule TAKE 1 CAPSULE BY MOUTH  DAILY 06/22/19   Richardson Dopp T, PA-C  chlorhexidine (PERIDEX) 0.12 % solution Use as directed 5 mLs in the mouth or throat daily. Swish and spit 01/17/15   [provider]  cholecalciferol (VITAMIN D) 1000 units tablet Take 3,000 Units by mouth daily.    [provider]  docusate sodium (COLACE) 100 MG capsule Take 1 capsule (100 mg total) by mouth 2 (two) times daily. 12/21/18   Georgette Shell, MD  DULoxetine (CYMBALTA) 30 MG capsule Take 30 mg by mouth See admin instructions. Take 30mg  by mouth on Tuesday, Thursday, and the weekend 09/21/18   [provider]  ezetimibe-simvastatin (VYTORIN) 10-40 MG tablet Take 1 tablet by mouth at bedtime. 08/04/19   Richardson Dopp T, PA-C  insulin aspart (NOVOLOG FLEXPEN) 100 UNIT/ML FlexPen Inject 15 Units into the skin See admin instructions. Inject 15 units subcutaneously two to three times daily with meals    [provider]  insulin glargine (LANTUS) 100 UNIT/ML injection Inject 0.3 mLs (30 Units total) into the skin 2 (two) times daily. 12/21/18   Georgette Shell, MD  magnesium oxide (MAG-OX) 400 MG tablet Take 1 tablet (400 mg total) by mouth daily. 07/07/16   Dorris Carnes  V, MD  potassium chloride SA (K-DUR,KLOR-CON) 20 MEQ tablet TAKE 1 TABLET(20 MEQ) BY MOUTH DAILY Patient taking differently: Take 20 mEq by mouth daily.  04/06/17   Richardson Dopp T, PA-C  senna-docusate (SENOKOT-S) 8.6-50 MG tablet Take 1 tablet by mouth at bedtime as needed for mild constipation. 12/21/18   Georgette Shell, MD  torsemide (DEMADEX) 20 MG tablet Take 2 tablets (40 mg total) by mouth daily. Please call and schedule an appt for further refills 3rd attempt 04/22/19   Fay Records, MD   Vitamin D, Ergocalciferol, (DRISDOL) 50000 UNITS CAPS capsule Take 50,000 Units by mouth every Wednesday.     [provider]    Allergies    Allergies as of 02/13/2021   (No Known Allergies)    Social History   reports that she has never smoked. She has never used smokeless tobacco. She reports current alcohol use. She reports that she does not use drugs.   Family History   Her family history includes Stroke in her mother; Sudden death in her mother. There is no history of Hypertension.   ROS  Review of systems unable to be obtained due to acute encephalopathy  Objective   Blood pressure (!) 100/44, pulse 70, temperature 98.1 F (36.7 C), temperature source Temporal, resp. rate 11, SpO2 91 %.   Examination: General: Elderly female, nontoxic appearing, in no acute distress HENT: Dry mucous membranes, head atraumatic Eyes: No scleral icterus or conjunctival injection Cardiac: Regular rate and rhythm, no lower extremity edema, extremities are warm Pulm: Breathing comfortably on room air, no wheezing or rhonchi GI: Soft, nontender.  Bowel sounds active, no suprapubic tenderness Neuro: Alert, oriented to self only.  Follows simple commands consistently.  Cranial nerves III-XII intact.  5 out of 5 strength in the bilateral upper extremities.  3 out of 5 strength in the bilateral lower extremities at the level of the hips, equal.  5 out of 5 strength on ankle flexion and extension bilaterally.  Sensation intact throughout MSK: Normal muscle bulk and tone, no tremor Skin: No rash, intragluteal cleft has a small area of superficial skin breakdown without drainage  Significant Diagnostic Tests:  EKG: Regular rate, sinus rhythm.  Right bundle branch block and left anterior fascicular block unchanged from prior  CXR: No acute cardiopulmonary disease.  Surgical clips are noted over the right medial hemithorax  CTA head W/O: No acute intracranial abnormality or trauma.  Chronic  small vessel disease stable since 2020  CT abdomen pelvis:  Right lower lobe pulmonary nodule measuring 9 x 7 mm.  Fibrotic changes in the lung bases bilaterally which appear progressive compared to prior exams but is mild. Aortic atherosclerosis.  No acute hepatobiliary findings.   No acute pancreatic findings.   2.2 x 1.8 cm low intermediate attenuation left adrenal nodule stable from prior exams, favored to represent a benign lesion.  Normal appearance of the right adrenal gland.   Exophytic 4.4 cm low-attenuation lesion in the upper pole the left kidney incompletely characterized on today's noncontrast CT exam but stable compared to prior study, statistically likely to represent a cyst.  Right kidney is normal in appearance.  No calcifications identified within the collecting system of either kidney, along the course of the ureters, or within the lumen of the urinary bladder.  Urinary bladder is unremarkable in appearance. No pathologic dilation of small bowel or colon.  Large volume of stool in the rectal vault, suggesting probable constipation.  Status post appendectomy. No  lymphadenopathy noted in the abdomen or pelvis. Uterus and ovaries are unremarkable in appearance Status post bilateral hip arthroplasty.  No aggressive appearing lytic or blastic lesions noted in the visualized portions of the skeleton   Labs    CBC Latest Ref Rng & Units 02/13/2021 12/19/2018 12/18/2018  WBC 4.0 - 10.5 K/uL 9.5 9.4 8.4  Hemoglobin 12.0 - 15.0 g/dL 11.2(L) 9.9(L) 10.4(L)  Hematocrit 36.0 - 46.0 % 36.2 33.2(L) 35.6(L)  Platelets 150 - 400 K/uL 150 209 207   BMP Latest Ref Rng & Units 02/13/2021 12/19/2018 12/19/2018  Glucose 70 - 99 mg/dL 184(H) 277(H) 203(H)  BUN 8 - 23 mg/dL 60(H) - 27(H)  Creatinine 0.44 - 1.00 mg/dL 1.80(H) - 1.28(H)  BUN/Creat Ratio 12 - 28 - - -  Sodium 135 - 145 mmol/L 137 - 137  Potassium 3.5 - 5.1 mmol/L 4.7 - 3.9  Chloride 98 - 111 mmol/L 105 - 102  CO2 22 - 32 mmol/L 22 - 24   Calcium 8.9 - 10.3 mg/dL 8.8(L) - 8.1(L)   Lipase 27 Troponin 19>20 UA>>pending  Summary  87 year old female with CKD 3B, CHF and recent history of stage IIb melanoma of the breast status post excision (10/2020) with clear margins who will be admitted to the teaching service for acute metabolic encephalopathy due to AKI and constipation.  Assessment & Plan:  Active Problems:   Acute metabolic encephalopathy  Acute metabolic encephalopathy due to AKI on CKD stage IIIb, uncomplicated UTI, and constipation No lesions noted on head CT. Consider metastatic disease unlikely in the absence of focal neurologic symptoms. UA suspicious for infection with +leukocytes, +nitrites, few bacteria, 21-50 WBC. No leukocytosis or fever to raise suspicion for complicated nature.  Suspect association between poor oral intake and constipation/abdominal pain.  Appears dry on exam. BUN to creatinine ratio suggestive of prerenal etiology although specific gravity is 1.013.  Likely complicated by renally filtered agents including lisinopril and torsemide.  Received 500 cc of IV fluids in the ED. Suspect rapid resolution of encephalopathy with treatment of above No evidence for bowel obstruction on imaging. She has a history of chronic constipation.  She underwent fecal disimpaction in the ED.  Plan We will give her another liter of LR, followed by 75 cc/h Encourage p.o. fluid intake Hold nephrotoxic agents as noted below Recheck renal function in a.m.Mar[place] Kitchen  Check mag and Phos in a.m. Antibiotic therapy: 1x dose of fosfomycin  Bowel management: MiraLAX twice daily, Senokot-S 2 tabs twice daily Could consider lactulose if constipation persists Delirium precautions  Stage II sacral pressure injury Frequent repositioning, barrier cream  Hx of Stage II melanoma s/p excision (10/2020). Margins noted to be clear on path review.   RLL lung nodule (9x33mm). Slightly larger in comparison to appearance on 09/2020 PET scan  although not noted to be metabolically active at that time. Given recent history of melanoma, would consider this high risk indicating evaluation for additional nodules.  Chest CT for evaluation of additional nodules  Type 2 diabetes mellitus, chronic and stable We will de-escalate her insulin regimen in the setting of decreased oral intake. Lantus 20 mg nightly, moderate sliding scale.  Adjust as needed  Hypertension, hyperlipidemia, chronic and stable Hold lisinopril in the setting of AKI  Continue Coreg, aspirin, ezetimibe, and simvastatin  Diastolic heart failure without acute exacerbation Hold torsemide Continue Coreg Daily weights, strict I's and O's  Anxiety/depression, chronic and stable Continue Cymbalta  Gout without acute flare Continue allopurinol   Anemia of CKD.  Chronic and stable. Continue to monitor.     Best practice:  CODE STATUS: DNR Diet: Regular DVT for prophylaxis: Heparin subcutaneous Social considerations/Family communication:  POA: Brandi Melton (daughter).  [phone].  Updated with admission.  She confirms DNR status Son-in-law, Brandi Melton updated as well Dispo: Admit patient to Observation with expected length of stay less than 2 midnights.  Brandi Hansen, MD Internal Medicine Resident PGY-3 [facility] Pager: [phone] 02/13/2021 9:01 AM

## 2021-02-13 NOTE — ED Provider Notes (Signed)
[facility] Emergency Department Provider Note MRN:  [id number]  Arrival date & time: 02/13/21     Chief Complaint   Abdominal Pain   History of Present Illness   Brandi Melton is a 87 y.o. year-old female with a history of CKD, diabetes, CHF presenting to the ED with chief complaint of abdominal pain.  Report of left upper quadrant abdominal pain, report of fall at facility recently.  Patient does endorse abdominal pain at this time, otherwise is confused to questioning.  I was unable to obtain an accurate HPI, PMH, or ROS due to the patient's altered mental status or dementia.  Level 5 caveat.  Review of Systems  Positive for abdominal pain.  Patient's Health History    Past Medical History:  Diagnosis Date   Arthritis    "hips before they were replaced, right knee now" (11/09/2015)   Chronic diastolic (congestive) heart failure (HCC)    a. echo 06/2015: EF 65-70%, Grade 1 DD, mitral valve calcification.   CKD (chronic kidney disease), stage III (HCC)    Depression    Dyspnea    H/O cardiac catheterization    a. h/o cardiac catheterization 05/2013 in [place] showing only minimal CAD with increased filling pressure.   Hyperlipemia    Hypertensive heart disease    Morbid obesity ([place])    Palsy, Bell's    RA (rheumatoid arthritis) ([place])    Sleep apnea    "suppose to wear mask; couldn't" (11/09/2015)   Type II diabetes mellitus [facility])     Past Surgical History:  Procedure Laterality Date   APPENDECTOMY  1966   CARDIAC CATHETERIZATION  05/2013   CARPAL TUNNEL RELEASE Right 08/08/2014   Procedure: OPEN CARPAL TUNNEL RELEASE;  Surgeon: Roseanne Kaufman, MD;  Location: [place];  Service: Orthopedics;  Laterality: Right;   CATARACT EXTRACTION W/ INTRAOCULAR LENS  IMPLANT, BILATERAL Bilateral    CHOLECYSTECTOMY OPEN  1966   COLONOSCOPY     FRACTURE SURGERY     JOINT REPLACEMENT     KNEE ARTHROPLASTY Left    ORIF WRIST FRACTURE Right 08/08/2014    Procedure: Right OPEN REDUCTION INTERNAL FIXATION (ORIF) WRIST FRACTURE WITH REPAIR AND RECONST/ALLOGRAFT AND BONE GRAFT;  Surgeon: Roseanne Kaufman, MD;  Location: [place];  Service: Orthopedics;  Laterality: Right;   TOTAL HIP ARTHROPLASTY Bilateral     Family History  Problem Relation Age of Onset   Sudden death Mother    Stroke Mother    Hypertension Neg Hx     Social History   Socioeconomic History   Marital status: Widowed    Spouse name: Not on file   Number of children: Not on file   Years of education: Not on file   Highest education level: Not on file  Occupational History   Not on file  Tobacco Use   Smoking status: Never   Smokeless tobacco: Never  Vaping Use   Vaping Use: Never used  Substance and Sexual Activity   Alcohol use: Yes    Alcohol/week: 0.0 standard drinks    Comment: 11/09/2015 "might have glass of champaign on special occasion"   Drug use: No   Sexual activity: Never  Other Topics Concern   Not on file  Social History Narrative   Not on file   Social Determinants of Health   Financial Resource Strain: Not on file  Food Insecurity: Not on file  Transportation Needs: Not on file  Physical Activity: Not on file  Stress: Not on  file  Social Connections: Not on file  Intimate Partner Violence: Not on file     Physical Exam   Vitals:   02/13/21 0515 02/13/21 0530  BP: (!) 142/62 (!) 132/53  Pulse: 73 73  Resp: (!) 22 16  Temp:    SpO2: 100% 97%    CONSTITUTIONAL: Chronically ill-appearing, NAD NEURO:  Alert and oriented to name only, no focal deficits EYES:  eyes equal and reactive ENT/NECK:  no LAD, no JVD CARDIO: Regular rate, well-perfused, normal S1 and S2 PULM:  CTAB no wheezing or rhonchi GI/GU:  normal bowel sounds, mildly distended, non-tender MSK/SPINE:  No gross deformities, no edema SKIN:  no rash, atraumatic PSYCH:  Appropriate speech and behavior  *Additional and/or pertinent findings included in MDM below  Diagnostic  and Interventional Summary    EKG Interpretation  Date/Time:  Wednesday February 13 2021 04:30:32 EDT Ventricular Rate:  67 PR Interval:  216 QRS Duration: 164 QT Interval:  494 QTC Calculation: 522 R Axis:   269 Text Interpretation: Sinus rhythm Borderline prolonged PR interval RBBB and LAFB Confirmed by Gerlene Fee [phone]) on 02/13/2021 4:36:50 AM       Labs Reviewed  CBC - Abnormal; Notable for the following components:      Result Value   RBC 3.72 (*)    Hemoglobin 11.2 (*)    All other components within normal limits  COMPREHENSIVE METABOLIC PANEL - Abnormal; Notable for the following components:   Glucose, Bld 184 (*)    BUN 60 (*)    Creatinine, Ser 1.80 (*)    Calcium 8.8 (*)    Albumin 3.3 (*)    GFR, Estimated 27 (*)    All other components within normal limits  TROPONIN I (HIGH SENSITIVITY) - Abnormal; Notable for the following components:   Troponin I (High Sensitivity) 19 (*)    All other components within normal limits  LIPASE, BLOOD  URINALYSIS, ROUTINE W REFLEX MICROSCOPIC  TROPONIN I (HIGH SENSITIVITY)    CT HEAD WO CONTRAST (5MM)  Final Result    CT ABDOMEN PELVIS WO CONTRAST  Final Result    DG Chest Port 1 View  Final Result      Medications  sodium chloride 0.9 % bolus 500 mL (0 mLs Intravenous Stopped 02/13/21 0531)     Procedures  /  Critical Care Procedures  ED Course and Medical Decision Making  I have reviewed the triage vital signs, the nursing notes, and pertinent available records from the EMR.  Listed above are laboratory and imaging tests that I personally ordered, reviewed, and interpreted and then considered in my medical decision making (see below for details).  Suspect history of dementia coming in with persistent abdominal pain per report.  Distended but nontender, reassuring vital signs, awaiting labs, CT.     Labs reveal acute on chronic kidney injury with BUN of 60.  Gained further history from patient's daughter,  patient does not have a confirmed diagnosis of dementia, does have some intermittent episodes of confusion over the past several months.  Patient thinks that it is February of 2002, seems to be more acutely altered.  Uremia could be contributing.  Also awaiting urinalysis.  Patient was complaining of some chest discomfort.  First troponin of 19.  CT scan revealing constipation versus fecal impaction, nursing staff to perform disimpaction.  Discussed case with family, will admit to medicine.  Barth Kirks. Sedonia Small, [place] [email or web address]  Final Clinical Impressions(s) /  ED Diagnoses     ICD-10-CM   1. Altered mental status, unspecified altered mental status type  R41.82     2. Uremia  N19     3. Constipation, unspecified constipation type  K59.00     4. Abdominal pain, unspecified abdominal location  R10.9       ED Discharge Orders     None        Discharge Instructions Discussed with and Provided to Patient:   Discharge Instructions   None       Maudie Flakes, MD 02/13/21 0700

## 2021-02-13 NOTE — Progress Notes (Signed)
Patient arrived on unit, vitals WNL, skin check done. Was eating dinner on arrival, set up to continue to eat dinner. RN contacted son-in-law by phone to give room number and update. He requested diabetic diet and, if possible, d/c tomorrow. States his desire is for patient to stay mobile, and because of her hx of depression, feels shortest stay possible would be beneficial for mood. RN stated will pass along to treatment team, will continue to monitor. Patient left resting comfortably, bed alarm on, lowest position.

## 2021-02-13 NOTE — ED Notes (Signed)
Disimpacted large amount of hard stools at rectal vault , patient expressed relief after procedure .

## 2021-02-14 DIAGNOSIS — N183 Chronic kidney disease, stage 3 unspecified: Secondary | ICD-10-CM | POA: Diagnosis not present

## 2021-02-14 DIAGNOSIS — N179 Acute kidney failure, unspecified: Secondary | ICD-10-CM

## 2021-02-14 DIAGNOSIS — Z794 Long term (current) use of insulin: Secondary | ICD-10-CM | POA: Diagnosis not present

## 2021-02-14 DIAGNOSIS — E119 Type 2 diabetes mellitus without complications: Secondary | ICD-10-CM | POA: Diagnosis not present

## 2021-02-14 LAB — CBC
HCT: 32.9 % — ABNORMAL LOW (ref 36.0–46.0)
Hemoglobin: 10.1 g/dL — ABNORMAL LOW (ref 12.0–15.0)
MCH: 29.4 pg (ref 26.0–34.0)
MCHC: 30.7 g/dL (ref 30.0–36.0)
MCV: 95.9 fL (ref 80.0–100.0)
Platelets: 140 10*3/uL — ABNORMAL LOW (ref 150–400)
RBC: 3.43 MIL/uL — ABNORMAL LOW (ref 3.87–5.11)
RDW: 15.1 % (ref 11.5–15.5)
WBC: 6.9 10*3/uL (ref 4.0–10.5)
nRBC: 0 % (ref 0.0–0.2)

## 2021-02-14 LAB — GLUCOSE, CAPILLARY
Glucose-Capillary: 155 mg/dL — ABNORMAL HIGH (ref 70–99)
Glucose-Capillary: 265 mg/dL — ABNORMAL HIGH (ref 70–99)
Glucose-Capillary: 164 mg/dL — ABNORMAL HIGH (ref 70–99)
Glucose-Capillary: 150 mg/dL — ABNORMAL HIGH (ref 70–99)

## 2021-02-14 LAB — BASIC METABOLIC PANEL
Anion gap: 4 — ABNORMAL LOW (ref 5–15)
BUN: 42 mg/dL — ABNORMAL HIGH (ref 8–23)
CO2: 26 mmol/L (ref 22–32)
Calcium: 8.5 mg/dL — ABNORMAL LOW (ref 8.9–10.3)
Chloride: 111 mmol/L (ref 98–111)
Creatinine, Ser: 1.53 mg/dL — ABNORMAL HIGH (ref 0.44–1.00)
GFR, Estimated: 33 mL/min — ABNORMAL LOW (ref >60)
Glucose, Bld: 183 mg/dL — ABNORMAL HIGH (ref 70–99)
Potassium: 4.2 mmol/L (ref 3.5–5.1)
Sodium: 141 mmol/L (ref 135–145)

## 2021-02-14 LAB — MAGNESIUM: Magnesium: 2.6 mg/dL — ABNORMAL HIGH (ref 1.7–2.4)

## 2021-02-14 LAB — PHOSPHORUS: Phosphorus: 3.4 mg/dL (ref 2.5–4.6)

## 2021-02-14 MED ORDER — POLYETHYLENE GLYCOL 3350 17 G PO PACK: 17.0000 g | PACK | Freq: Two times a day (BID) | ORAL | 0 refills | Status: AC

## 2021-02-14 MED ORDER — MAGNESIUM OXIDE 400 MG PO TABS: 400.0000 mg | ORAL_TABLET | Freq: Every day | ORAL | 3 refills | Status: AC

## 2021-02-14 NOTE — Evaluation (Signed)
Occupational Therapy Evaluation Patient Details Name: Brandi Melton MRN: [id number] DOB: 1930/12/25 Today's Date: 02/14/2021   History of Present Illness Pt is an 87 y/o female admitted 11/2 secondary to [place]. Though to be acute metabolic encephalopathy. Pt also with UTI. PMH includes DM, CKD, dCHF, and gout.   Clinical Impression   Patient admitted for the diagnosis above.  PTA she lives in an ALF, required assist for lower body ADL, and walked to the dinning room with RW.  Currently she remains very confused, needing constant verbal cueing to sequence through simple transfers.  She unaware of her surroundings, and is demonstrating poor safety awareness.  OT will follow in the acute setting, but currently she needs 24 hour assist as needed to ensure safety with mobility, medications, and ADL completion.  OT recommends short term rehab at a local SNF, unless her ALF can provide the above level of care.       Recommendations for follow up therapy are one component of a multi-disciplinary discharge planning process, led by the attending physician.  Recommendations may be updated based on patient status, additional functional criteria and insurance authorization.   Follow Up Recommendations  Skilled nursing-short term rehab (<3 hours/day)    Assistance Recommended at Discharge Frequent or constant Supervision/Assistance  Functional Status Assessment  Patient has had a recent decline in their functional status and demonstrates the ability to make significant improvements in function in a reasonable and predictable amount of time.  Equipment Recommendations  None recommended by OT    Recommendations for Other Services       Precautions / Restrictions Precautions Precautions: Fall Restrictions Weight Bearing Restrictions: No      Mobility Bed Mobility Overal bed mobility: Needs Assistance Bed Mobility: Supine to Sit     Supine to sit: Min assist       Patient Response:  Cooperative  Transfers Overall transfer level: Needs assistance Equipment used: Rolling walker (2 wheels) Transfers: Sit to/from [facility] Sit to Stand: Supervision Stand pivot transfers: Supervision         General transfer comment: decreased safety awareness      Balance Overall balance assessment: Needs assistance Sitting-balance support: Feet supported Sitting balance-Leahy Scale: Fair     Standing balance support: Bilateral upper extremity supported;Reliant on assistive device for balance Standing balance-Leahy Scale: Poor                             ADL either performed or assessed with clinical judgement   ADL       Grooming: Wash/dry hands;Wash/dry face;Set up;Sitting       Lower Body Bathing: Moderate assistance;Sitting/lateral leans       Lower Body Dressing: Moderate assistance;Sitting/lateral leans   Toilet Transfer: Min guard;Rolling walker (2 wheels);Ambulation   Toileting- Clothing Manipulation and Hygiene: Supervision/safety;Sitting/lateral lean               Vision Patient Visual Report: No change from baseline       Perception Perception Perception: Within Functional Limits   Praxis Praxis Praxis: Intact    Pertinent Vitals/Pain Pain Assessment: No/denies pain     Hand Dominance Right   Extremity/Trunk Assessment Upper Extremity Assessment Upper Extremity Assessment: Overall WFL for tasks assessed   Lower Extremity Assessment Lower Extremity Assessment: Defer to PT evaluation   Cervical / Trunk Assessment Cervical / Trunk Assessment: Kyphotic   Communication Communication Communication: No difficulties   Cognition Arousal/Alertness: Awake/alert Behavior  During Therapy: WFL for tasks assessed/performed Overall Cognitive Status: Impaired/Different from baseline Area of Impairment: Orientation;Attention;Memory;Following commands;Safety/judgement;Awareness;Problem solving                  Orientation Level: Disoriented to;Place;Time;Situation Current Attention Level: Sustained Memory: Decreased short-term memory Following Commands: Follows one step commands inconsistently Safety/Judgement: Decreased awareness of safety;Decreased awareness of deficits Awareness: Intellectual Problem Solving: Slow processing;Decreased initiation;Difficulty sequencing;Requires verbal cues;Requires tactile cues                        Home Living Family/patient expects to be discharged to:: Assisted living                             Home Equipment: Rolling Walker (2 wheels);Grab bars - tub/shower;Shower seat          Prior Functioning/Environment Prior Level of Function : Needs assist       Physical Assist : ADLs (physical)   ADLs (physical): Bathing;Dressing Mobility Comments: Pt reports she was able to ambulate with her walker independently in her apartment and to the dinning area ADLs Comments: Reports she needed assist with ADLs, "they are good about jumping in if I need help".        OT Problem List: Decreased activity tolerance;Impaired balance (sitting and/or standing);Decreased cognition      OT Treatment/Interventions: Self-care/ADL training;Therapeutic activities;Therapeutic exercise;Cognitive remediation/compensation;DME and/or AE instruction;Patient/family education;Balance training    OT Goals(Current goals can be found in the care plan section) Acute Rehab OT Goals Patient Stated Goal: None stated OT Goal Formulation: Patient unable to participate in goal setting Time For Goal Achievement: 02/28/21 Potential to Achieve Goals: Good ADL Goals Pt Will Perform Grooming: with set-up;standing Pt Will Perform Lower Body Bathing: with set-up;sit to/from stand Pt Will Perform Lower Body Dressing: with set-up;sit to/from stand Pt Will Transfer to Toilet: with modified independence;ambulating;regular height toilet  OT Frequency: Min 2X/week    Barriers to D/C: Decreased caregiver support          Co-evaluation              AM-PAC OT "6 Clicks" Daily Activity     Outcome Measure Help from another person eating meals?: A Little Help from another person taking care of personal grooming?: A Little Help from another person toileting, which includes using toliet, bedpan, or urinal?: A Little Help from another person bathing (including washing, rinsing, drying)?: A Lot Help from another person to put on and taking off regular upper body clothing?: None Help from another person to put on and taking off regular lower body clothing?: A Lot 6 Click Score: 17   End of Session Equipment Utilized During Treatment: Gait belt;Rolling walker (2 wheels) Nurse Communication: Mobility status  Activity Tolerance: Patient tolerated treatment well Patient left: in chair;with call bell/phone within reach;with chair alarm set;with nursing/sitter in room  OT Visit Diagnosis: Unsteadiness on feet (R26.81);Other symptoms and signs involving cognitive function                Time: 1548-1610 OT Time Calculation (min): 22 min Charges:  OT General Charges $OT Visit: 1 Visit OT Evaluation $OT Eval Moderate Complexity: 1 Mod  02/14/2021  RP, OTR/L  Acute Rehabilitation Services  Office:  [phone]   Metta Clines 02/14/2021, 4:32 PM

## 2021-02-14 NOTE — Discharge Instructions (Signed)
Maybee were recently admitted to [facility] for some confusion due to a urinary tract infection, constipation, and dehydration. We gave you some antibiotics and fluids while you were here.  Urinary tract infection If you start to feel increasing burning, urgency, or frequency with urination please let your doctor know. You may require further treatment for your infection. Continue to drink lots of water.  2. Constipation We are sending you out with some medications to take for your constipation. You will take magnesium oxide daily, sennokot twice a day, and miralax twice a day. Follow up with your doctor if you are still experiencing constipation.   It was a pleasure taking care of you and we hope that you continue to feel better. Let your caretakers know if you have any increasing pain, trouble going to the bathroom, or start experiencing diarrhea.

## 2021-02-14 NOTE — Plan of Care (Signed)
  Problem: Education: Goal: Knowledge of General Education information will improve Description: Including pain rating scale, medication(s)/side effects and non-pharmacologic comfort measures 02/14/2021 1613 by Bess Harvest, RN Outcome: Adequate for Discharge 02/14/2021 1240 by Bess Harvest, RN Outcome: Progressing   Problem: Health Behavior/Discharge Planning: Goal: Ability to manage health-related needs will improve 02/14/2021 1613 by Bess Harvest, RN Outcome: Adequate for Discharge 02/14/2021 1240 by Bess Harvest, RN Outcome: Progressing   Problem: Clinical Measurements: Goal: Ability to maintain clinical measurements within normal limits will improve 02/14/2021 1613 by Bess Harvest, RN Outcome: Adequate for Discharge 02/14/2021 1240 by Bess Harvest, RN Outcome: Progressing Goal: Will remain free from infection 02/14/2021 1613 by Bess Harvest, RN Outcome: Adequate for Discharge 02/14/2021 1240 by Bess Harvest, RN Outcome: Progressing Goal: Diagnostic test results will improve 02/14/2021 1613 by Bess Harvest, RN Outcome: Adequate for Discharge 02/14/2021 1240 by Bess Harvest, RN Outcome: Progressing Goal: Respiratory complications will improve 02/14/2021 1613 by Bess Harvest, RN Outcome: Adequate for Discharge 02/14/2021 1240 by Bess Harvest, RN Outcome: Progressing Goal: Cardiovascular complication will be avoided 02/14/2021 1613 by Bess Harvest, RN Outcome: Adequate for Discharge 02/14/2021 1240 by Bess Harvest, RN Outcome: Progressing   Problem: Activity: Goal: Risk for activity intolerance will decrease 02/14/2021 1613 by Bess Harvest, RN Outcome: Adequate for Discharge 02/14/2021 1240 by Bess Harvest, RN Outcome: Progressing   Problem: Nutrition: Goal: Adequate nutrition will be maintained 02/14/2021 1613 by Bess Harvest, RN Outcome: Adequate for Discharge 02/14/2021 1240 by Bess Harvest, RN Outcome:  Progressing   Problem: Coping: Goal: Level of anxiety will decrease 02/14/2021 1613 by Bess Harvest, RN Outcome: Adequate for Discharge 02/14/2021 1240 by Bess Harvest, RN Outcome: Progressing   Problem: Elimination: Goal: Will not experience complications related to bowel motility 02/14/2021 1613 by Bess Harvest, RN Outcome: Adequate for Discharge 02/14/2021 1240 by Bess Harvest, RN Outcome: Progressing Goal: Will not experience complications related to urinary retention 02/14/2021 1613 by Bess Harvest, RN Outcome: Adequate for Discharge 02/14/2021 1240 by Bess Harvest, RN Outcome: Progressing   Problem: Pain Managment: Goal: General experience of comfort will improve 02/14/2021 1613 by Bess Harvest, RN Outcome: Adequate for Discharge 02/14/2021 1240 by Bess Harvest, RN Outcome: Progressing   Problem: Safety: Goal: Ability to remain free from injury will improve 02/14/2021 1613 by Bess Harvest, RN Outcome: Adequate for Discharge 02/14/2021 1240 by Bess Harvest, RN Outcome: Progressing   Problem: Skin Integrity: Goal: Risk for impaired skin integrity will decrease 02/14/2021 1613 by Bess Harvest, RN Outcome: Adequate for Discharge 02/14/2021 1240 by Bess Harvest, RN Outcome: Progressing   Problem: Acute Rehab PT Goals(only PT should resolve) Goal: Pt Will Go Supine/Side To Sit Outcome: Adequate for Discharge Goal: Pt Will Go Sit To Supine/Side Outcome: Adequate for Discharge Goal: Patient Will Transfer Sit To/From Stand Outcome: Adequate for Discharge Goal: Pt Will Ambulate Outcome: Adequate for Discharge

## 2021-02-14 NOTE — NC FL2 (Signed)
[place] MEDICAID FL2 LEVEL OF CARE SCREENING TOOL     IDENTIFICATION  Patient Name: Brandi Melton Birthdate: 1930/05/06 Sex: female Admission Date (Current Location): 02/13/2021  [facility] and [place] Number:  Herbalist and Address:  [facility], [place] [address], [place], [place] [address]      Provider Number: [id number]  Attending Physician Name and Address:  Angelica Pou, MD  Relative Name and Phone Number:       Current Level of Care: Hospital Recommended Level of Care: Assisted Living Facility Prior Approval Number:    Date Approved/Denied:   PASRR Number:    Discharge Plan: Other (Comment) (ALF)    Current Diagnoses: Patient Active Problem List   Diagnosis Date Noted   Acute metabolic encephalopathy 73/41/9379   AKI (acute kidney injury) ([place]) 02/13/2021   Pressure injury of coccygeal region, stage 2 ([place]) 02/13/2021   Nodule of lower lobe of right lung 02/13/2021   Hx of malignant melanoma of skin 02/13/2021   Pressure injury of skin 12/20/2018   Gout 02/19/2016   H/O cardiac catheterization    Hypertensive heart disease with CHF (congestive heart failure) (HCC)    Hypoglycemia due to insulin    CKD (chronic kidney disease), stage III (HCC)    Chronic diastolic CHF (congestive heart failure) ([place])    Morbid obesity ([place])    Hypertension    Hyperlipemia    Sleep apnea    Diabetes mellitus type 2, insulin dependent ([place]) 08/10/2014    Orientation RESPIRATION BLADDER Height & Weight     Self, Situation, Place  Normal Continent Weight: 200 lb (90.7 kg) Height:  5\' 4"  (162.6 cm)  BEHAVIORAL SYMPTOMS/MOOD NEUROLOGICAL BOWEL NUTRITION STATUS      Continent Diet (no added salt)  AMBULATORY STATUS COMMUNICATION OF NEEDS Skin   Extensive Assist Verbally Normal                       Personal Care Assistance Level of Assistance  Bathing, Feeding, Dressing Bathing Assistance: Maximum assistance Feeding  assistance: Limited assistance Dressing Assistance: Maximum assistance     Functional Limitations Info             SPECIAL CARE FACTORS FREQUENCY  PT (By licensed PT), OT (By licensed OT)     PT Frequency: 3x/wk with home health OT Frequency: 3x/wk with home health            Contractures Contractures Info: Not present    Additional Factors Info  Code Status, Allergies, Psychotropic Code Status Info: DNR Allergies Info: NKA Psychotropic Info: Cymbalta 30mg          Current Medications (02/14/2021):  This is the current hospital active medication list Current Facility-Administered Medications  Medication Dose Route Frequency Provider Last Rate Last Admin   acetaminophen (TYLENOL) tablet 650 mg  650 mg Oral Q6H PRN Darrick Meigs, Rylee, MD   650 mg at 02/14/21 0615   allopurinol (ZYLOPRIM) tablet 100 mg  100 mg Oral Daily Christian, Rylee, MD   100 mg at 02/14/21 1200   aspirin EC tablet 81 mg  81 mg Oral Daily Christian, Rylee, MD   81 mg at 02/14/21 1159   carvedilol (COREG CR) 24 hr capsule 80 mg  80 mg Oral Daily Christian, Rylee, MD   80 mg at 02/14/21 1159   cholecalciferol (VITAMIN D3) tablet 2,000 Units  2,000 Units Oral Daily Mitzi Hansen, MD   2,000 Units at 02/14/21 1201  DULoxetine (CYMBALTA) DR capsule 30 mg  30 mg Oral Once per day on Sun Tue Thu Sat Mitzi Hansen, MD   30 mg at 02/14/21 1202   simvastatin (ZOCOR) tablet 40 mg  40 mg Oral QHS Reome, Earle J, RPH   40 mg at 02/13/21 2259   And   ezetimibe (ZETIA) tablet 10 mg  10 mg Oral QHS Reome, Earle J, RPH   10 mg at 02/13/21 2300   heparin injection 5,000 Units  5,000 Units Subcutaneous Q8H Christian, Rylee, MD   5,000 Units at 02/14/21 0606   insulin aspart (novoLOG) injection 0-15 Units  0-15 Units Subcutaneous TID WC Christian, Rylee, MD   8 Units at 02/14/21 1203   insulin glargine-yfgn (SEMGLEE) injection 20 Units  20 Units Subcutaneous QHS Mitzi Hansen, MD   20 Units at 02/13/21 2135    magnesium oxide (MAG-OX) tablet 400 mg  400 mg Oral Daily Christian, Rylee, MD   400 mg at 02/14/21 1202   oxybutynin (DITROPAN) tablet 2.5 mg  2.5 mg Oral BID Christian, Rylee, MD   2.5 mg at 02/14/21 1202   polyethylene glycol (MIRALAX / GLYCOLAX) packet 17 g  17 g Oral BID Christian, Rylee, MD   17 g at 02/14/21 1212   senna-docusate (Senokot-S) tablet 2 tablet  2 tablet Oral BID Mitzi Hansen, MD   2 tablet at 02/14/21 1203     Discharge Medications: STOP taking these medications     docusate sodium 100 MG capsule Commonly known as: COLACE           TAKE these medications     Accu-Chek Aviva Plus test strip Generic drug: glucose blood TEST BS QID    acetaminophen 325 MG tablet Commonly known as: TYLENOL Take 650 mg by mouth every 6 (six) hours as needed for fever or mild pain.    allopurinol 100 MG tablet Commonly known as: ZYLOPRIM Take 100 mg by mouth daily. 0800    aspirin EC 81 MG tablet Take 81 mg by mouth daily. 0800    calcium carbonate 500 MG chewable tablet Commonly known as: TUMS - dosed in mg elemental calcium Chew 1 tablet by mouth daily. 0800    carvedilol 80 MG 24 hr capsule Commonly known as: COREG CR TAKE 1 CAPSULE BY MOUTH  DAILY What changed: additional instructions    DULoxetine 30 MG capsule Commonly known as: CYMBALTA Take 30 mg by mouth See admin instructions. Take on Tuesday, Thursday, Saturday, and Sunday. Do not crush. 0800.    ezetimibe-simvastatin 10-40 MG tablet Commonly known as: VYTORIN Take 1 tablet by mouth at bedtime. What changed:  when to take this additional instructions    glucose 4 GM chewable tablet Chew 1 tablet by mouth 3 (three) times daily as needed for low blood sugar. Take if BS is less than 70. Recheck BS 20 mins after given tablet and record.    guaiFENesin 100 MG/5ML liquid Commonly known as: ROBITUSSIN Take 10 mLs by mouth every 4 (four) hours as needed for cough.    insulin aspart 100 UNIT/ML  FlexPen Commonly known as: NOVOLOG Inject 20 Units into the skin See admin instructions. Inject SQ before each meal (0715, 1115, 1645). Hold if FSBS is less than 120; notify MD if less 50 or greater than 400.    insulin glargine 100 UNIT/ML Solostar Pen Commonly known as: LANTUS Inject 40 Units into the skin 2 (two) times daily. 0700, 2000.    lisinopril 2.5 MG tablet Commonly  known as: ZESTRIL Take 2.5 mg by mouth daily. 0800    magnesium oxide 400 MG tablet Commonly known as: MAG-OX Take 1 tablet (400 mg total) by mouth daily. What changed:  how much to take additional instructions    nystatin powder Commonly known as: MYCOSTATIN/NYSTOP Apply 1 application topically 2 (two) times daily. Under breast until healed. 0800, 2000.    OVER THE COUNTER MEDICATION Apply 1 application topically 3 (three) times daily. Apply small amount to buttocks. 0800, 1500, 2300.    oxybutynin 5 MG tablet Commonly known as: DITROPAN Take 2.5 mg by mouth 2 (two) times daily. 0800, 2000.    polyethylene glycol 17 g packet Commonly known as: MIRALAX / GLYCOLAX Take 17 g by mouth 2 (two) times daily.    potassium chloride SA 20 MEQ tablet Commonly known as: KLOR-CON TAKE 1 TABLET(20 MEQ) BY MOUTH DAILY What changed: See the new instructions.    senna-docusate 8.6-50 MG tablet Commonly known as: Senokot-S Take 1 tablet by mouth at bedtime as needed for mild constipation. What changed:  when to take this additional instructions    torsemide 20 MG tablet Commonly known as: DEMADEX Take 2 tablets (40 mg total) by mouth daily. Please call and schedule an appt for further refills 3rd attempt What changed: additional instructions    Vitamin D 50 MCG (2000 UT) Caps Take 2,000 Units by mouth daily. 0800    Relevant Imaging Results:  Relevant Lab Results:   Additional Information SS#: [id number]  Geralynn Ochs, LCSW

## 2021-02-14 NOTE — Progress Notes (Incomplete)
Pt medically stable for discharge per MD order. IV removed. Belongings: clothing, shoes, . Report called to Geni, all questions answered. PTAR to transport to [place] at [facility].

## 2021-02-14 NOTE — Evaluation (Signed)
Physical Therapy Evaluation Patient Details Name: Brandi Melton MRN: [id number] DOB: 1931/02/19 Today's Date: 02/14/2021  History of Present Illness  Pt is an 87 y/o female admitted 11/2 secondary to AMS. Though to be acute metabolic encephalopathy. Pt also with UTI. PMH includes DM, CKD, dCHF, and gout.  Clinical Impression  Pt admitted secondary to problem above with deficits below. Pt keeping eyes closed throughout session, but would respond to questions. Adamantly refusing OOB mobility, but was eventually agreeable to change soiled sheets. Required max A +2 for rolling for clean up. Per pt and chart, pt from ILF and normally ambulates with RW. If ALF can accommodate new physical assist requirements, recommending HHPT follow up. However, if ALF cannot provide necessary assist, feel pt would benefit from SNF stay to ensure safety with mobility prior to return to ALF. Will continue to follow acutely.      Recommendations for follow up therapy are one component of a multi-disciplinary discharge planning process, led by the attending physician.  Recommendations may be updated based on patient status, additional functional criteria and insurance authorization.  Follow Up Recommendations Skilled nursing-short term rehab (<3 hours/day) (unless ALF can provide necessary support then would benefit from [place])    Assistance Recommended at Discharge Frequent or constant Supervision/Assistance  Functional Status Assessment Patient has had a recent decline in their functional status and demonstrates the ability to make significant improvements in function in a reasonable and predictable amount of time.  Equipment Recommendations  Wheelchair cushion (measurements PT);Wheelchair (measurements PT)    Recommendations for Other Services       Precautions / Restrictions Precautions Precautions: Fall Restrictions Weight Bearing Restrictions: No      Mobility  Bed Mobility Overal bed mobility: Needs  Assistance Bed Mobility: Rolling Rolling: Max assist;+2 for physical assistance         General bed mobility comments: Assist with rolling for clean up. Pt adamantly refusing further mobility.    Transfers                        Ambulation/Gait                Stairs            Wheelchair Mobility    Modified Rankin (Stroke Patients Only)       Balance                                             Pertinent Vitals/Pain Pain Assessment: No/denies pain    Home Living Family/patient expects to be discharged to:: Assisted living                 Home Equipment: Conservation officer, nature (2 wheels)      Prior Function Prior Level of Function : Needs assist       Physical Assist : ADLs (physical)   ADLs (physical): Bathing;Dressing Mobility Comments: Pt reports she was able to ambulate with her walker independently ADLs Comments: Reports she needed assist with ADLs.     Hand Dominance        Extremity/Trunk Assessment   Upper Extremity Assessment Upper Extremity Assessment: Defer to OT evaluation    Lower Extremity Assessment Lower Extremity Assessment: Generalized weakness    Cervical / Trunk Assessment Cervical / Trunk Assessment: Kyphotic  Communication   Communication: No difficulties  Cognition Arousal/Alertness:  Lethargic Behavior During Therapy: Flat affect Overall Cognitive Status: No family/caregiver present to determine baseline cognitive functioning                                 General Comments: Pt keeping eyes closed throughout but would respond to questions. Agitated initially and adamantly refusing, however, eventually agreeable to changing linens to prevent further skin breakdown.        General Comments General comments (skin integrity, edema, etc.): No family present    Exercises     Assessment/Plan    PT Assessment Patient needs continued PT services  PT Problem List  Decreased strength;Decreased range of motion;Decreased activity tolerance;Decreased mobility;Decreased balance;Decreased knowledge of use of DME;Decreased knowledge of precautions;Decreased safety awareness;Decreased cognition       PT Treatment Interventions DME instruction;Gait training;Functional mobility training;Therapeutic activities;Therapeutic exercise;Balance training;Patient/family education;Cognitive remediation    PT Goals (Current goals can be found in the Care Plan section)  Acute Rehab PT Goals PT Goal Formulation: Patient unable to participate in goal setting Time For Goal Achievement: 02/28/21 Potential to Achieve Goals: Fair    Frequency Min 2X/week   Barriers to discharge        Co-evaluation               AM-PAC PT "6 Clicks" Mobility  Outcome Measure Help needed turning from your back to your side while in a flat bed without using bedrails?: Total Help needed moving from lying on your back to sitting on the side of a flat bed without using bedrails?: Total Help needed moving to and from a bed to a chair (including a wheelchair)?: Total Help needed standing up from a chair using your arms (e.g., wheelchair or bedside chair)?: Total Help needed to walk in hospital room?: Total Help needed climbing 3-5 steps with a railing? : Total 6 Click Score: 6    End of Session   Activity Tolerance: Patient limited by fatigue;Patient limited by lethargy Patient left: in bed;with call bell/phone within reach;with bed alarm set;with nursing/sitter in room Nurse Communication: Mobility status PT Visit Diagnosis: Other abnormalities of gait and mobility (R26.89);Muscle weakness (generalized) (M62.81);Difficulty in walking, not elsewhere classified (R26.2)    Time: 5056-9794 PT Time Calculation (min) (ACUTE ONLY): 20 min   Charges:   PT Evaluation $PT Eval Moderate Complexity: 1 Mod          Reuel Derby, PT, DPT  Acute Rehabilitation Services  Pager: [phone] Office: [phone]   Rudean Hitt 02/14/2021, 12:02 PM

## 2021-02-14 NOTE — Progress Notes (Signed)
Notified on 11/2 by Juliann Pulse RN

## 2021-02-14 NOTE — Progress Notes (Signed)
Attempted to call Daughter listed on chart but no answer to attempt to verify immunizations and other admission questions no answer x 2.

## 2021-02-14 NOTE — Plan of Care (Signed)

## 2021-02-14 NOTE — Discharge Summary (Addendum)
Name: Brandi Melton MRN: [id number] DOB: [birth year] 87 y.o. PCP: No primary care provider on file.  Date of Admission: 02/13/2021  3:57 AM Date of Discharge:  02/14/2021 Attending Physician: Angelica Pou, MD  Discharge Diagnosis: 1. Acute metabolic encephalopathy 2. AKI on CKDIIIb 3. Uncomplicated UTI 4. Constipation 5.Stage II sacral pressure injury 6. Hx of Stage II melanoma  7. RLL lung nodule (9x71mm) 8. Type 2 diabetes mellitus 9. Diastolic heart failure without acute exacerbation 10. Hypertension 11. Anxiety/depression 12. Gout without acute flare 13. Anemia of CKD.  Discharge Medications: Allergies as of 02/14/2021   No Known Allergies      Medication List     STOP taking these medications    docusate sodium 100 MG capsule Commonly known as: COLACE       TAKE these medications    Accu-Chek Aviva Plus test strip Generic drug: glucose blood TEST BS QID   acetaminophen 325 MG tablet Commonly known as: TYLENOL Take 650 mg by mouth every 6 (six) hours as needed for fever or mild pain.   allopurinol 100 MG tablet Commonly known as: ZYLOPRIM Take 100 mg by mouth daily. 0800   aspirin EC 81 MG tablet Take 81 mg by mouth daily. 0800   calcium carbonate 500 MG chewable tablet Commonly known as: TUMS - dosed in mg elemental calcium Chew 1 tablet by mouth daily. 0800   carvedilol 80 MG 24 hr capsule Commonly known as: COREG CR TAKE 1 CAPSULE BY MOUTH  DAILY What changed: additional instructions   DULoxetine 30 MG capsule Commonly known as: CYMBALTA Take 30 mg by mouth See admin instructions. Take on Tuesday, Thursday, Saturday, and Sunday. Do not crush. 0800.   ezetimibe-simvastatin 10-40 MG tablet Commonly known as: VYTORIN Take 1 tablet by mouth at bedtime. What changed:  when to take this additional instructions   glucose 4 GM chewable tablet Chew 1 tablet by mouth 3 (three) times daily as needed for low blood sugar. Take if BS is less  than 70. Recheck BS 20 mins after given tablet and record.   guaiFENesin 100 MG/5ML liquid Commonly known as: ROBITUSSIN Take 10 mLs by mouth every 4 (four) hours as needed for cough.   insulin aspart 100 UNIT/ML FlexPen Commonly known as: NOVOLOG Inject 20 Units into the skin See admin instructions. Inject SQ before each meal (0715, 1115, 1645). Hold if FSBS is less than 120; notify MD if less 50 or greater than 400.   insulin glargine 100 UNIT/ML Solostar Pen Commonly known as: LANTUS Inject 40 Units into the skin 2 (two) times daily. 0700, 2000.   lisinopril 2.5 MG tablet Commonly known as: ZESTRIL Take 2.5 mg by mouth daily. 0800   magnesium oxide 400 MG tablet Commonly known as: MAG-OX Take 1 tablet (400 mg total) by mouth daily. What changed:  how much to take additional instructions   nystatin powder Commonly known as: MYCOSTATIN/NYSTOP Apply 1 application topically 2 (two) times daily. Under breast until healed. 0800, 2000.   OVER THE COUNTER MEDICATION Apply 1 application topically 3 (three) times daily. Apply small amount to buttocks. 0800, 1500, 2300.   oxybutynin 5 MG tablet Commonly known as: DITROPAN Take 2.5 mg by mouth 2 (two) times daily. 0800, 2000.   polyethylene glycol 17 g packet Commonly known as: MIRALAX / GLYCOLAX Take 17 g by mouth 2 (two) times daily.   potassium chloride SA 20 MEQ tablet Commonly known as: KLOR-CON TAKE 1 TABLET(20 MEQ) BY MOUTH  DAILY What changed: See the new instructions.   senna-docusate 8.6-50 MG tablet Commonly known as: Senokot-S Take 1 tablet by mouth at bedtime as needed for mild constipation. What changed:  when to take this additional instructions   torsemide 20 MG tablet Commonly known as: DEMADEX Take 2 tablets (40 mg total) by mouth daily. Please call and schedule an appt for further refills 3rd attempt What changed: additional instructions   Vitamin D 50 MCG (2000 UT) Caps Take 2,000 Units by mouth  daily. 0800        Disposition and follow-up:   Ms.Elinora J Maheu was discharged from [facility] in Stable condition.  At the hospital follow up visit please address:  1.  Constipation- discharged with new bowel regimen, consider lactulose if still having difficulties  2.  AKI on CKD in setting of UTI- assess creatine function and UOP. Ucx still pending at discharge, assess susceptibility to fosfomycin and consider getting a UA  3. Pressure injury- assess for worsening/improvement, continue repositioning as able  4. RLL lung nodule- get CT in 6 months (about may 2023)  5. Diabetes- Adjust insulin dosing as needed  6.  Labs / imaging needed at time of follow-up: BMP, CBC  7.  Pending labs/ test needing follow-up: Urine culture  Follow-up Appointments:   Hospital Course by problem list: 1. Acute metabolic encephalopathy Patient came in after an acute mental status change from her assisted living facility. She had abdominal pain but was unable to characterize it well. The mental status change was thought to be multifactorial in nature. Upon presentation to the ED she was suspected to have a UTI based on her UA and her creatinine was elevated to 1.8 from a baseline of 1.3. She was also constipated requiring disimpaction. After relieving her stool burden, treating her UTI, and providing fluids she improved significantly and was back to baseline. She was deemed appropriate for discharge to avoid a prolonged hospital stay or to develop delirium.   2.  AKI on CKDIIIb Creatinine elevated to 1.8 from a baseline of 1.3 in the setting of AMS and likely decreased PO intake as well as a UTI. Patient was given a fluid bolus and a short course of maintenance fluids before she was again more oriented and able to take in PO. Her creatinine improved to 1.5. With the improvement in her constipation as well suspect that this will continue to improve.  3. UTI Patient's UA on admission  positive for LE and nitrites. She was given one dose of fosfomycin. Today she is not complaining of any burning with urination, increased frequency, and does not have any suprapubic pain on exam. Will instruct PCP to follow up on urine culture susceptibilities.  4. Constipation Patient had large stool burden seen on CTAP and underwent disimpaction in the ED. She also had two hard bowel movements this morning. Will prescribe her a bowel regimen of 400 magnesium oxide 400, senna 2 tablets BID, and miralax 17 twice daily upon discharge. Will instruct PCP to reevaluate regimen at follow up.  5.RLL lung nodule Lesion was incidentally seen on CTAP, CT chest obtained to evaluate for possible other lesions given recent melanoma though no other lesions were visualized. Recommended outpatient CT follow up in 6 months.  6. Sacral wound Red/purple blanching skin with some open areas. Attempted pressures deloading and frequent repositioning. Recommend follow up as outpatient and continued barreir cream.   7. Diabetes Patient's long acting insulin was decreased while in the  hospital 2/2 AMS and decreased oral intake however patient should resume prior regimen and follow up with PCP as an outpatient.   8. Hypertension Held lisinopril in setting of AKI, resumed lisinopril and Coreg  at discharge.  9. Diastolic HF Was not in acute exacerbation, held lasix during admission 2/2 AKI. Will restart medications upon discharge  10. Hyperlipidemia Continued home ezetimibe and simvastatin  11. Anxiety/depression Continued outside cymbalta  12. Gout Not in acute flare, continued home allopurinal  13. Anemia Chronic and stable, no interventions taken  Discharge Exam:   BP (!) 141/49 (BP Location: Right Arm)   Pulse 67   Temp 97.7 F (36.5 C) (Oral)   Resp 18   Ht 5\' 4"  (1.626 m)   Wt 90.7 kg   SpO2 97%   BMI 34.33 kg/m  Discharge exam:  Constitutional: obese elderly woman eating pancakes sitting  upright in bed with breakfast tray in front of her. HENT: normocephalic atraumatic, mucous membranes moist Eyes: conjunctiva non-erythematous Neck: supple Cardiovascular: regular rate and rhythm, no m/r/g, no LEE Pulmonary/Chest: normal work of breathing on room air, lungs clear to auscultation bilaterally Abdominal: soft, non-tender, non-distended MSK: normal bulk and tone Neurological: alert, oriented to self, place, and year, not to month or the current president (said Regan) Skin: warm and dry, senile purpura along the bilateral upper extremities. Well healed scar on the anterior chest Psych: pleasant affect  Pertinent Labs, Studies, and Procedures:  CT ABDOMEN PELVIS WO CONTRAST  Result Date: 02/13/2021 CLINICAL DATA:  87 year old female with history of left upper quadrant abdominal pain. EXAM: CT ABDOMEN AND PELVIS WITHOUT CONTRAST TECHNIQUE: Multidetector CT imaging of the abdomen and pelvis was performed following the standard protocol without IV contrast. COMPARISON:  CT the pelvis 10/16/2018. CT the abdomen and pelvis 05/20/2017. FINDINGS: Lower chest: Right lower lobe pulmonary nodule measuring 9 x 7 mm (mean diameter of 8 mm). Mild fibrotic changes are noted in the lung bases, progressive when compared to the prior examination. Atherosclerotic calcifications in the descending thoracic aorta. Severe calcifications of the mitral annulus. Mild calcifications of the aortic valve. Hepatobiliary: No definite suspicious cystic or solid hepatic lesions are noted in the visualized portions of the liver (dome of the right lobe of the liver is incompletely imaged) on today's noncontrast CT examination. Small calcified granuloma in the inferior aspect of the right lobe of the liver incidentally noted. Gallbladder is not visualized, presumably surgically absent (no surgical clips are noted in the gallbladder fossa). Pancreas: No definite pancreatic mass or peripancreatic fluid collections or  inflammatory changes are noted on today's noncontrast CT examination. Spleen: Unremarkable. Adrenals/Urinary Tract: 2.2 x 1.8 cm low-intermediate attenuation (13 HU) left adrenal nodule, stable compared to the prior examination, favored to represent a benign lesions such as a lipid poor adenoma. Right adrenal gland is normal in appearance. Exophytic 4.4 cm low-attenuation lesion in the upper pole of the left kidney, incompletely characterized on today's non-contrast CT examination, but stable compared to the prior study, statistically likely to represent a cyst. Right kidney is normal in appearance. There are no calcifications identified within the collecting system of either kidney, along the course of either ureter, or within the lumen of the urinary bladder. Urinary bladder is unremarkable in appearance. Stomach/Bowel: Unenhanced appearance of the stomach is normal. No pathologic dilatation of small bowel or colon. Large volume of stool in the rectal vault, suggesting probable constipation. Status post appendectomy. Vascular/Lymphatic: Aortic atherosclerosis. No lymphadenopathy noted in the abdomen or pelvis. Reproductive:  Uterus and ovaries are unremarkable in appearance. Other: No significant volume of ascites.  No pneumoperitoneum. Musculoskeletal: Status post bilateral hip arthroplasty. There are no aggressive appearing lytic or blastic lesions noted in the visualized portions of the skeleton. IMPRESSION: 1. No acute findings are noted in the abdomen or pelvis to account for the patient's symptoms. 2. However, there is a large volume of stool in the rectal vault suggesting constipation. 3. Right lower lobe pulmonary nodule with a mean diameter of 8 mm, slightly larger than recent PET-CT, concerning for potential metastatic lesion in this patient with history of malignant melanoma. Close attention on follow-up studies is recommended. 4. Fibrotic changes in the lung bases which appear progressive compared to  prior examinations. Nonemergent outpatient referral to Pulmonology should be considered for further clinical evaluation. 5. There are calcifications of the aortic valve and mitral annulus. Echocardiographic correlation for evaluation of potential valvular dysfunction may be warranted if clinically indicated. 6. Aortic atherosclerosis. 7. Additional incidental findings, as above. Electronically Signed   By: Vinnie Langton M.D.   On: 02/13/2021 06:25   CT HEAD WO CONTRAST (5MM)  Result Date: 02/13/2021 CLINICAL DATA:  87 year old female status post fall yesterday. Abdominal pain. EXAM: CT HEAD WITHOUT CONTRAST TECHNIQUE: Contiguous axial images were obtained from the base of the skull through the vertex without intravenous contrast. COMPARISON:  Brain MRI 10/04/2020.  Head CT 12/17/2018. FINDINGS: Brain: No midline shift, ventriculomegaly, mass effect, evidence of mass lesion, intracranial hemorrhage or evidence of cortically based acute infarction. Patchy and confluent bilateral white matter hypodensity has not significantly changed from 2020. Scattered bilateral deep white matter capsule involvement is chronic. Mild deep gray matter nuclei heterogeneity is chronic. Vascular: Extensive Calcified atherosclerosis at the skull base. No suspicious intracranial vascular hyperdensity. Skull: No acute osseous abnormality identified. Sinuses/Orbits: Chronic sphenoid sinus disease has not significantly changed. Mild paranasal sinus mucosal thickening elsewhere. Tympanic cavities and mastoids remain clear. Other: No acute orbit or scalp soft tissue finding. IMPRESSION: 1. No acute intracranial abnormality or acute traumatic injury identified. 2. Chronic small vessel disease appears stable since 2020. Electronically Signed   By: Genevie Ann M.D.   On: 02/13/2021 06:24   CT CHEST WO CONTRAST  Result Date: 02/13/2021 CLINICAL DATA:  Pulmonary nodule seen on abdominal/pelvic CT scan, same date. EXAM: CT CHEST WITHOUT  CONTRAST TECHNIQUE: Multidetector CT imaging of the chest was performed following the standard protocol without IV contrast. COMPARISON:  PET-CT 10/10/2020 FINDINGS: Cardiovascular: The heart is normal in size. No pericardial effusion. Prominent pericardial and epicardial fat. Stable atherosclerotic calcifications involving the aorta and branch vessels including three-vessel coronary artery calcifications. Calcifications also noted in the region of the mitral valve annulus and around the aortic valve. Mediastinum/Nodes: No mediastinal or hilar mass or lymphadenopathy. Small scattered lymph nodes are stable. Lungs/Pleura: Stable 8 mm nodule at the right lung base just above the right hemidiaphragm. Stable 6 mm subpleural nodule in the right lower lobe on image number 78/4. No new pulmonary nodules. No acute pulmonary findings. Stable chronic basilar scarring changes. No pleural effusions or pleural lesions. Upper Abdomen: Stable left adrenal gland nodule measuring 2 Hounsfield units and consistent with a benign adenoma. No upper abdominal adenopathy. Stable left renal cyst. Advanced vascular calcifications again noted. Musculoskeletal: No breast masses, supraclavicular or axillary adenopathy. The thyroid gland is unremarkable. The bony thorax is intact. IMPRESSION: 1. Stable right lower lobe pulmonary nodules. Recommend follow-up noncontrast chest CT in 6 months. 2. No mediastinal or hilar mass or  adenopathy. 3. Stable advanced vascular calcifications. 4. Stable left adrenal gland adenoma. 5. Aortic atherosclerosis. Aortic Atherosclerosis (ICD10-I70.0). Electronically Signed   By: Marijo Sanes M.D.   On: 02/13/2021 12:04   DG Chest Port 1 View  Result Date: 02/13/2021 CLINICAL DATA:  87 year old female with history of sudden onset of left upper quadrant abdominal pain at 2 a.m. this morning. EXAM: PORTABLE CHEST 1 VIEW COMPARISON:  Chest x-ray 12/18/2018. FINDINGS: Lung volumes are low. No consolidative airspace  disease. No pleural effusions. No pneumothorax. No pulmonary nodule or mass noted. Pulmonary vasculature is normal. Heart size is borderline enlarged, accentuated by low lung volumes. The patient is rotated to the right on today's exam, resulting in distortion of the mediastinal contours and reduced diagnostic sensitivity and specificity for mediastinal pathology. Multiple surgical clips project over the medial right hemithorax. Atherosclerotic calcifications in the thoracic aorta. IMPRESSION: 1. Low lung volumes without radiographic evidence of acute cardiopulmonary disease. 2. Aortic atherosclerosis. Electronically Signed   By: Vinnie Langton M.D.   On: 02/13/2021 04:58    CBC Latest Ref Rng & Units 02/14/2021 02/13/2021 12/19/2018  WBC 4.0 - 10.5 K/uL 6.9 9.5 9.4  Hemoglobin 12.0 - 15.0 g/dL 10.1(L) 11.2(L) 9.9(L)  Hematocrit 36.0 - 46.0 % 32.9(L) 36.2 33.2(L)  Platelets 150 - 400 K/uL 140(L) 150 209    BMP Latest Ref Rng & Units 02/14/2021 02/13/2021 12/19/2018  Glucose 70 - 99 mg/dL 183(H) 184(H) 277(H)  BUN 8 - 23 mg/dL 42(H) 60(H) -  Creatinine 0.44 - 1.00 mg/dL 1.53(H) 1.80(H) -  BUN/Creat Ratio 12 - 28 - - -  Sodium 135 - 145 mmol/L 141 137 -  Potassium 3.5 - 5.1 mmol/L 4.2 4.7 -  Chloride 98 - 111 mmol/L 111 105 -  CO2 22 - 32 mmol/L 26 22 -  Calcium 8.9 - 10.3 mg/dL 8.5(L) 8.8(L) -     Discharge Instructions: Discharge Instructions     Call MD for:  persistant dizziness or light-headedness   Complete by: As directed    Call MD for:  persistant nausea and vomiting   Complete by: As directed    Call MD for:  temperature >100.4   Complete by: As directed    Diet - low sodium heart healthy   Complete by: As directed    Diet Carb Modified   Complete by: As directed    Increase activity slowly   Complete by: As directed        Signed: Scarlett Presto, MD 02/14/2021, 1:44 PM   Pager: [phone]

## 2021-02-14 NOTE — Progress Notes (Signed)
Report called to Geni at [facility], all questions answered.

## 2021-02-14 NOTE — TOC Transition Note (Signed)
Transition of Care [facility]) - CM/SW Discharge Note   Patient Details  Name: Brandi Melton MRN: [id number] Date of Birth: 07-16-1930  Transition of Care [facility]) CM/SW Contact:  Geralynn Ochs, LCSW Phone Number: 02/14/2021, 3:43 PM   Clinical Narrative:   Nurse to call report to [phone].    Final next level of care: Assisted Living Barriers to Discharge: Barriers Resolved   Patient Goals and CMS Choice        Discharge Placement              Patient chooses bed at: [facility] Patient to be transferred to facility by: [place] Name of family member notified: [name] Patient and family notified of of transfer: 02/14/21  Discharge Plan and Services                          HH Arranged: PT, OT [place] Agency: Other - See comment Secondary school teacher) Date HH Agency Contacted: 02/14/21   Representative spoke with at [place]: [place] ([place]) Interventions     Readmission Risk Interventions No flowsheet data found.

## 2021-02-14 NOTE — Plan of Care (Signed)
Pt is now alert oriented x 4 but forgetful. Pt has been agitated when interacting and asking questions.Pt c/o abdominal pain, prn tylenol given.  Pt noted pushing and grunting this morning. Pt had two medium size hard, bowel movements this morning.  Pt noted to be pushing still but nothing else coming out. Pt cleaned up, repositioned. Foam dressing applied to buttock/ sacrum where pt has red/purple area that is blanchable with open areas. Pt offered coffee, she accepted asked for cream. Pt content and pleasant after giving her some coffee.   Problem: Education: Goal: Knowledge of General Education information will improve Description: Including pain rating scale, medication(s)/side effects and non-pharmacologic comfort measures 02/14/2021 0756 by Allayne Stack, RN Outcome: Progressing 02/14/2021 0134 by Allayne Stack, RN Outcome: Progressing   Problem: Health Behavior/Discharge Planning: Goal: Ability to manage health-related needs will improve 02/14/2021 0756 by Allayne Stack, RN Outcome: Progressing 02/14/2021 0134 by Allayne Stack, RN Outcome: Progressing   Problem: Clinical Measurements: Goal: Ability to maintain clinical measurements within normal limits will improve 02/14/2021 0756 by Allayne Stack, RN Outcome: Progressing 02/14/2021 0134 by Allayne Stack, RN Outcome: Progressing Goal: Will remain free from infection 02/14/2021 0756 by Allayne Stack, RN Outcome: Progressing 02/14/2021 0134 by Allayne Stack, RN Outcome: Progressing Goal: Diagnostic test results will improve 02/14/2021 0756 by Allayne Stack, RN Outcome: Progressing 02/14/2021 0134 by Allayne Stack, RN Outcome: Progressing Goal: Respiratory complications will improve 02/14/2021 0756 by Allayne Stack, RN Outcome: Progressing 02/14/2021 0134 by Allayne Stack, RN Outcome: Progressing Goal: Cardiovascular complication will be avoided 02/14/2021 0756 by Allayne Stack, RN Outcome: Progressing 02/14/2021 0134 by Allayne Stack, RN Outcome: Progressing   Problem: Activity: Goal: Risk for activity intolerance will decrease 02/14/2021 0756 by Allayne Stack, RN Outcome: Progressing 02/14/2021 0134 by Allayne Stack, RN Outcome: Progressing   Problem: Nutrition: Goal: Adequate nutrition will be maintained 02/14/2021 0756 by Allayne Stack, RN Outcome: Progressing 02/14/2021 0134 by Allayne Stack, RN Outcome: Progressing   Problem: Coping: Goal: Level of anxiety will decrease 02/14/2021 0756 by Allayne Stack, RN Outcome: Progressing 02/14/2021 0134 by Allayne Stack, RN Outcome: Progressing   Problem: Elimination: Goal: Will not experience complications related to bowel motility 02/14/2021 0756 by Allayne Stack, RN Outcome: Progressing 02/14/2021 0134 by Allayne Stack, RN Outcome: Progressing Goal: Will not experience complications related to urinary retention 02/14/2021 0756 by Allayne Stack, RN Outcome: Progressing 02/14/2021 0134 by Allayne Stack, RN Outcome: Progressing   Problem: Pain Managment: Goal: General experience of comfort will improve 02/14/2021 0756 by Allayne Stack, RN Outcome: Progressing 02/14/2021 0134 by Allayne Stack, RN Outcome: Progressing   Problem: Safety: Goal: Ability to remain free from injury will improve 02/14/2021 0756 by Allayne Stack, RN Outcome: Progressing 02/14/2021 0134 by Allayne Stack, RN Outcome: Progressing   Problem: Skin Integrity: Goal: Risk for impaired skin integrity will decrease 02/14/2021 0756 by Allayne Stack, RN Outcome: Progressing 02/14/2021 0134 by Allayne Stack, RN Outcome: Progressing

## 2021-02-14 NOTE — Progress Notes (Signed)
HD#0 Subjective:  Overnight Events: NAEO   Patient with breakfast in front of her on interview, initially asleep holding her fork. Woke up and began to start eating again. She says that he pancakes are ok, but thinks they are pretty dry. When asked how she was feeling she says better but is unable to quantify how she is feeling better and loses her train of thought when asked more complex questions.  Objective:  Vital signs in last 24 hours: Vitals:   02/13/21 2017 02/14/21 0009 02/14/21 0426 02/14/21 0700  BP: (!) 120/42 (!) 151/63 (!) 145/50 (!) 130/49  Pulse: 78 76 65 61  Resp: 19 19 15 18   Temp: 97.7 F (36.5 C) 98.2 F (36.8 C) 97.9 F (36.6 C) 98.1 F (36.7 C)  TempSrc: Oral Oral Oral Axillary  SpO2: 98% 94% 94% 95%  Weight:      Height:       Supplemental O2: Room Air SpO2: 95 %   Physical Exam:  Constitutional: obese elderly woman eating pancakes sitting upright in bed with breakfast tray in front of her. HENT: normocephalic atraumatic, mucous membranes moist Eyes: conjunctiva non-erythematous Neck: supple Cardiovascular: regular rate and rhythm, no m/r/g Pulmonary/Chest: normal work of breathing on room air, lungs clear to auscultation bilaterally Abdominal: soft, non-tender, non-distended MSK: normal bulk and tone Neurological: alert, oriented to self, place, and year, not to month or the current president (said [place]) Skin: warm and dry Psych: pleasant affect  Filed Weights   02/13/21 1321  Weight: 90.7 kg     Intake/Output Summary (Last 24 hours) at 02/14/2021 0903 Last data filed at 02/14/2021 0834 Gross per 24 hour  Intake 1120 ml  Output 1050 ml  Net 70 ml   Net IO Since Admission: 70 mL [02/14/21 0903]  Pertinent Labs: CBC Latest Ref Rng & Units 02/14/2021 02/13/2021 12/19/2018  WBC 4.0 - 10.5 K/uL 6.9 9.5 9.4  Hemoglobin 12.0 - 15.0 g/dL 10.1(L) 11.2(L) 9.9(L)  Hematocrit 36.0 - 46.0 % 32.9(L) 36.2 33.2(L)  Platelets 150 - 400 K/uL 140(L)  150 209    CMP Latest Ref Rng & Units 02/14/2021 02/13/2021 12/19/2018  Glucose 70 - 99 mg/dL 183(H) 184(H) 277(H)  BUN 8 - 23 mg/dL 42(H) 60(H) -  Creatinine 0.44 - 1.00 mg/dL 1.53(H) 1.80(H) -  Sodium 135 - 145 mmol/L 141 137 -  Potassium 3.5 - 5.1 mmol/L 4.2 4.7 -  Chloride 98 - 111 mmol/L 111 105 -  CO2 22 - 32 mmol/L 26 22 -  Calcium 8.9 - 10.3 mg/dL 8.5(L) 8.8(L) -  Total Protein 6.5 - 8.1 g/dL - 6.5 -  Total Bilirubin 0.3 - 1.2 mg/dL - 0.7 -  Alkaline Phos 38 - 126 U/L - 74 -  AST 15 - 41 U/L - 27 -  ALT 0 - 44 U/L - 24 -    Imaging: CT CHEST WO CONTRAST  Result Date: 02/13/2021 CLINICAL DATA:  Pulmonary nodule seen on abdominal/pelvic CT scan, same date. EXAM: CT CHEST WITHOUT CONTRAST TECHNIQUE: Multidetector CT imaging of the chest was performed following the standard protocol without IV contrast. COMPARISON:  PET-CT 10/10/2020 FINDINGS: Cardiovascular: The heart is normal in size. No pericardial effusion. Prominent pericardial and epicardial fat. Stable atherosclerotic calcifications involving the aorta and branch vessels including three-vessel coronary artery calcifications. Calcifications also noted in the region of the mitral valve annulus and around the aortic valve. Mediastinum/Nodes: No mediastinal or hilar mass or lymphadenopathy. Small scattered lymph nodes are stable. Lungs/Pleura:  Stable 8 mm nodule at the right lung base just above the right hemidiaphragm. Stable 6 mm subpleural nodule in the right lower lobe on image number 78/4. No new pulmonary nodules. No acute pulmonary findings. Stable chronic basilar scarring changes. No pleural effusions or pleural lesions. Upper Abdomen: Stable left adrenal gland nodule measuring 2 Hounsfield units and consistent with a benign adenoma. No upper abdominal adenopathy. Stable left renal cyst. Advanced vascular calcifications again noted. Musculoskeletal: No breast masses, supraclavicular or axillary adenopathy. The thyroid gland is  unremarkable. The bony thorax is intact. IMPRESSION: 1. Stable right lower lobe pulmonary nodules. Recommend follow-up noncontrast chest CT in 6 months. 2. No mediastinal or hilar mass or adenopathy. 3. Stable advanced vascular calcifications. 4. Stable left adrenal gland adenoma. 5. Aortic atherosclerosis. Aortic Atherosclerosis (ICD10-I70.0). Electronically Signed   By: Marijo Sanes M.D.   On: 02/13/2021 12:04    Assessment/Plan:   Principal Problem:   AKI (acute kidney injury) ([place]) Active Problems:   Diabetes mellitus type 2, insulin dependent ([place])   CKD (chronic kidney disease), stage III (H[facility])   Chronic diastolic CHF (congestive heart failure) (HCC)   Acute metabolic encephalopathy   Pressure injury of coccygeal region, stage 2 ([place])   Nodule of lower lobe of right lung   Hx of malignant melanoma of skin   Patient Summary: Brandi Melton is a 87 y.o. with CKD 3B, CHF and recent history of stage IIb melanoma of the breast status post excision (10/2020) with clear margins admitted for acute metabolic encephalopathy due to AKI and constipation.  Acute metabolic encephalopathy 2/2  AKI on CKD stage IIIb, uncomplicated UTI, and constipation; improving Improving today, patient able to converse and answer simple questions but still having difficulty concentrating. Unclear if this is her baseline, will try and reach out to family today to get a better idea of where she was at before this. Patient did have several hard bowel movements overnight, and has received antibiotics for UTI. Given underlying causes are being addressed suspect this will continue to improve - obtain collateral information from family - continue to encourage PO intake - PT/OT eval - delirium precautions  Uncomplicated UTI AKI on CKD stage IIIb Patient's creatinine has improved today from 1.8 to 1.5 after receiving a fluid bolus and then maintenance fluids for 16 hours. Now s/p 1 dose of fosfomycin. - encourage PO  intake - f/u Ucx - daily BMP  Constipation; improving No evidence for bowel obstruction on imaging. She has a history of chronic constipation.  She underwent fecal disimpaction in the ED and had several hard bowel movements overnight.  - MiraLAX twice daily, Senokot-S 2 tabs twice daily - consider lactulose if constipation persists  Stage II sacral pressure injury - Frequent repositioning, barrier cream   Hx of Stage II melanoma  s/p excision (10/2020). Margins noted to be clear on path review.    RLL lung nodule (9x76mm).  Slightly larger in comparison to appearance on 09/2020 PET scan although not noted to be metabolically active at that time. Given recent history of melanoma obtained a chest CT to evaluate for further nodules. No other nodules seen, recommended follow up in 6 months - outpatient CT in 6 months   Type 2 diabetes mellitus, chronic and stable De-escalated her insulin regimen in the setting of decreased oral intake. - Lantus 20 mg nightly, moderate sliding scale   Hypertension, , chronic and stable - Holding lisinopril in the setting of AKI  - Continue Coreg  Hyperlipidemia Continued home ezetimibe and simvastatin  Diastolic heart failure without acute exacerbation - Holding torsemide - Continue Coreg and aspirin - Daily weights, strict I's and O's - K>4 Mg>2  Anxiety/depression, chronic and stable - Continue Cymbalta   Gout without acute flare - Continue allopurinol    Anemia of CKD. Chronic and stable. Continue to monitor.   Diet: Carb/Renal IVF: None,None VTE: Heparin Code: DNR PT/OT recs: pending  Prior to Admission Living Arrangement: [facility]  Anticipated Discharge Location: [facility] Barriers to Discharge: Improvement in Cr/UTI treatment Dispo: Anticipated discharge to  [place]  in 1 day   Scarlett Presto, MD Internal Medicine Resident PGY-1 Pager [phone] Please contact the on call pager after 5 pm and on weekends at [phone].

## 2021-02-14 NOTE — Care Management Obs Status (Signed)
[place] NOTIFICATION   Patient Details  Name: Brandi Melton MRN: [id number] Date of Birth: [birth year]   Medicare Observation Status Notification Given:  Yes    Geralynn Ochs, LCSW 02/14/2021, 3:51 PM

## 2021-02-15 LAB — URINE CULTURE: Culture: >=100000 — AB

## 2021-03-16 ENCOUNTER — Emergency Department (HOSPITAL_COMMUNITY): Payer: Medicare HMO

## 2021-03-16 ENCOUNTER — Encounter (HOSPITAL_COMMUNITY): Payer: Self-pay

## 2021-03-16 ENCOUNTER — Other Ambulatory Visit: Payer: Self-pay

## 2021-03-16 ENCOUNTER — Emergency Department (HOSPITAL_COMMUNITY)
Admission: EM | Admit: 2021-03-16 | Discharge: 2021-03-17 | Disposition: A | Discharge status: Home / Self Care | Payer: Medicare HMO | Source: Home / Self Care | Attending: Emergency Medicine | Admitting: Emergency Medicine

## 2021-03-16 DIAGNOSIS — N183 Chronic kidney disease, stage 3 unspecified: Secondary | ICD-10-CM | POA: Insufficient documentation

## 2021-03-16 DIAGNOSIS — Z79899 Other long term (current) drug therapy: Secondary | ICD-10-CM | POA: Insufficient documentation

## 2021-03-16 DIAGNOSIS — N9489 Other specified conditions associated with female genital organs and menstrual cycle: Secondary | ICD-10-CM | POA: Insufficient documentation

## 2021-03-16 DIAGNOSIS — W19XXXA Unspecified fall, initial encounter: Secondary | ICD-10-CM

## 2021-03-16 DIAGNOSIS — Y92129 Unspecified place in nursing home as the place of occurrence of the external cause: Secondary | ICD-10-CM | POA: Insufficient documentation

## 2021-03-16 DIAGNOSIS — W010XXA Fall on same level from slipping, tripping and stumbling without subsequent striking against object, initial encounter: Secondary | ICD-10-CM | POA: Insufficient documentation

## 2021-03-16 DIAGNOSIS — I130 Hypertensive heart and chronic kidney disease with heart failure and stage 1 through stage 4 chronic kidney disease, or unspecified chronic kidney disease: Secondary | ICD-10-CM | POA: Insufficient documentation

## 2021-03-16 DIAGNOSIS — E119 Type 2 diabetes mellitus without complications: Secondary | ICD-10-CM | POA: Insufficient documentation

## 2021-03-16 DIAGNOSIS — R8271 Bacteriuria: Secondary | ICD-10-CM

## 2021-03-16 DIAGNOSIS — Z794 Long term (current) use of insulin: Secondary | ICD-10-CM | POA: Insufficient documentation

## 2021-03-16 DIAGNOSIS — I5032 Chronic diastolic (congestive) heart failure: Secondary | ICD-10-CM | POA: Insufficient documentation

## 2021-03-16 DIAGNOSIS — Z20822 Contact with and (suspected) exposure to covid-19: Secondary | ICD-10-CM | POA: Insufficient documentation

## 2021-03-16 DIAGNOSIS — Z96643 Presence of artificial hip joint, bilateral: Secondary | ICD-10-CM | POA: Insufficient documentation

## 2021-03-16 DIAGNOSIS — Z7982 Long term (current) use of aspirin: Secondary | ICD-10-CM | POA: Insufficient documentation

## 2021-03-16 DIAGNOSIS — R4781 Slurred speech: Secondary | ICD-10-CM | POA: Diagnosis not present

## 2021-03-16 DIAGNOSIS — R531 Weakness: Secondary | ICD-10-CM | POA: Insufficient documentation

## 2021-03-16 DIAGNOSIS — Z85820 Personal history of malignant melanoma of skin: Secondary | ICD-10-CM | POA: Insufficient documentation

## 2021-03-16 DIAGNOSIS — I6349 Cerebral infarction due to embolism of other cerebral artery: Secondary | ICD-10-CM | POA: Diagnosis not present

## 2021-03-16 DIAGNOSIS — Z96652 Presence of left artificial knee joint: Secondary | ICD-10-CM | POA: Insufficient documentation

## 2021-03-16 LAB — I-STAT CHEM 8, ED
BUN: 50 mg/dL — ABNORMAL HIGH (ref 8–23)
Calcium, Ion: 1.12 mmol/L — ABNORMAL LOW (ref 1.15–1.40)
Chloride: 105 mmol/L (ref 98–111)
Creatinine, Ser: 1.7 mg/dL — ABNORMAL HIGH (ref 0.44–1.00)
Glucose, Bld: 248 mg/dL — ABNORMAL HIGH (ref 70–99)
HCT: 31 % — ABNORMAL LOW (ref 36.0–46.0)
Hemoglobin: 10.5 g/dL — ABNORMAL LOW (ref 12.0–15.0)
Potassium: 4.6 mmol/L (ref 3.5–5.1)
Sodium: 139 mmol/L (ref 135–145)
TCO2: 27 mmol/L (ref 22–32)

## 2021-03-16 LAB — DIFFERENTIAL
Abs Immature Granulocytes: 0.07 10*3/uL (ref 0.00–0.07)
Basophils Absolute: 0 10*3/uL (ref 0.0–0.1)
Basophils Relative: 0 %
Eosinophils Absolute: 0.5 10*3/uL (ref 0.0–0.5)
Eosinophils Relative: 5 %
Immature Granulocytes: 1 %
Lymphocytes Relative: 28 %
Lymphs Abs: 2.7 10*3/uL (ref 0.7–4.0)
Monocytes Absolute: 1 10*3/uL (ref 0.1–1.0)
Monocytes Relative: 10 %
Neutro Abs: 5.4 10*3/uL (ref 1.7–7.7)
Neutrophils Relative %: 56 %

## 2021-03-16 LAB — URINALYSIS, ROUTINE W REFLEX MICROSCOPIC
Bilirubin Urine: NEGATIVE
Glucose, UA: NEGATIVE mg/dL
Hgb urine dipstick: NEGATIVE
Ketones, ur: NEGATIVE mg/dL
Nitrite: POSITIVE — AB
Protein, ur: NEGATIVE mg/dL
Specific Gravity, Urine: 1.01 (ref 1.005–1.030)
pH: 5 (ref 5.0–8.0)

## 2021-03-16 LAB — COMPREHENSIVE METABOLIC PANEL
ALT: 22 U/L (ref 0–44)
AST: 25 U/L (ref 15–41)
Albumin: 3.1 g/dL — ABNORMAL LOW (ref 3.5–5.0)
Alkaline Phosphatase: 75 U/L (ref 38–126)
Anion gap: 7 (ref 5–15)
BUN: 53 mg/dL — ABNORMAL HIGH (ref 8–23)
CO2: 25 mmol/L (ref 22–32)
Calcium: 8.9 mg/dL (ref 8.9–10.3)
Chloride: 106 mmol/L (ref 98–111)
Creatinine, Ser: 1.75 mg/dL — ABNORMAL HIGH (ref 0.44–1.00)
GFR, Estimated: 28 mL/min — ABNORMAL LOW (ref >60)
Glucose, Bld: 255 mg/dL — ABNORMAL HIGH (ref 70–99)
Potassium: 4.7 mmol/L (ref 3.5–5.1)
Sodium: 138 mmol/L (ref 135–145)
Total Bilirubin: 0.4 mg/dL (ref 0.3–1.2)
Total Protein: 6.4 g/dL — ABNORMAL LOW (ref 6.5–8.1)

## 2021-03-16 LAB — RESP PANEL BY RT-PCR (FLU A&B, COVID) ARPGX2
Influenza A by PCR: NEGATIVE
Influenza B by PCR: NEGATIVE
SARS Coronavirus 2 by RT PCR: NEGATIVE

## 2021-03-16 LAB — ETHANOL: Alcohol, Ethyl (B): <10 mg/dL (ref <10)

## 2021-03-16 LAB — RAPID URINE DRUG SCREEN, HOSP PERFORMED
Amphetamines: NOT DETECTED
Barbiturates: NOT DETECTED
Benzodiazepines: NOT DETECTED
Cocaine: NOT DETECTED
Opiates: NOT DETECTED
Tetrahydrocannabinol: NOT DETECTED

## 2021-03-16 LAB — CBC
HCT: 34.1 % — ABNORMAL LOW (ref 36.0–46.0)
Hemoglobin: 10.1 g/dL — ABNORMAL LOW (ref 12.0–15.0)
MCH: 29.2 pg (ref 26.0–34.0)
MCHC: 29.6 g/dL — ABNORMAL LOW (ref 30.0–36.0)
MCV: 98.6 fL (ref 80.0–100.0)
Platelets: 170 10*3/uL (ref 150–400)
RBC: 3.46 MIL/uL — ABNORMAL LOW (ref 3.87–5.11)
RDW: 15.4 % (ref 11.5–15.5)
WBC: 9.6 10*3/uL (ref 4.0–10.5)
nRBC: 0 % (ref 0.0–0.2)

## 2021-03-16 LAB — PROTIME-INR
INR: 1.2 (ref 0.8–1.2)
Prothrombin Time: 15 seconds (ref 11.4–15.2)

## 2021-03-16 LAB — APTT: aPTT: 22 seconds — ABNORMAL LOW (ref 24–36)

## 2021-03-16 MED ORDER — CEPHALEXIN 500 MG PO CAPS: 500.0000 mg | ORAL_CAPSULE | Freq: Three times a day (TID) | ORAL | 0 refills | Status: DC

## 2021-03-16 NOTE — Discharge Instructions (Signed)
Continue your medications at home.  Follow-up with your doctor to be rechecked.  Return as needed for worsening symptoms

## 2021-03-16 NOTE — ED Triage Notes (Signed)
[facility] reports pt coming from [facility]. Pt had 2 falls yesterday which family did not want pt sent to hospital. Family evaluated her, son is physician. Today as pt was leaving the dining room she was not able to walk like she normally does. States pt was leaning to the left, had weak grips on the left side as well. Pt has hx of dementia.

## 2021-03-16 NOTE — ED Notes (Signed)
Attempted to call [facility] x4 and unable to get RN on phone to give report, will reattempt.

## 2021-03-16 NOTE — ED Provider Notes (Signed)
[facility] EMERGENCY DEPARTMENT Provider Note   CSN: [id number] Arrival date & time: 03/16/21  1117     History Chief Complaint  Patient presents with   Fall   Weakness    Brandi Melton is a 87 y.o. female.   Cerebrovascular Accident  Patient presented to the ED for evaluation of falls and weakness.  Patient apparently had 2 falls yesterday.  Patient states she tripped on a piece of wood that was on the floor.  That is the reason that she fell.  However according to the EMS notes the nursing facility noticed that the patient was very weak today and had difficulty walking using her walker after going to the dining room.  She seemed to be leaning to 1 side.  They were concerned about the possibility of stroke and she was sent to the ED for evaluation.  Patient herself denies any injuries.  She states she is not having a headache.  She denies feeling weak    Past Medical History:  Diagnosis Date   Arthritis    "hips before they were replaced, right knee now" (11/09/2015)   Chronic diastolic (congestive) heart failure ([place])    a. echo 06/2015: EF 65-70%, Grade 1 DD, mitral valve calcification.   CKD (chronic kidney disease), stage III (HCC)    Depression    Dyspnea    H/O cardiac catheterization    a. h/o cardiac catheterization 05/2013 in [place] showing only minimal CAD with increased filling pressure.   Hyperlipemia    Hypertensive heart disease    Morbid obesity ([place])    Palsy, Bell's    RA (rheumatoid arthritis) ([place])    Sleep apnea    "suppose to wear mask; couldn't" (11/09/2015)   Type II diabetes mellitus ([place])     Patient Active Problem List   Diagnosis Date Noted   Acute metabolic encephalopathy 29/92/4268   AKI (acute kidney injury) ([place]) 02/13/2021   Pressure injury of coccygeal region, stage 2 ([place]) 02/13/2021   Nodule of lower lobe of right lung 02/13/2021   Hx of malignant melanoma of skin 02/13/2021   Pressure injury of skin 12/20/2018    Gout 02/19/2016   H/O cardiac catheterization    Hypertensive heart disease with CHF (congestive heart failure) (HCC)    Hypoglycemia due to insulin    CKD (chronic kidney disease), stage III (HCC)    Chronic diastolic CHF (congestive heart failure) ([place])    Morbid obesity ([place])    Hypertension    Hyperlipemia    Sleep apnea    Diabetes mellitus type 2, insulin dependent ([place]) 08/10/2014    Past Surgical History:  Procedure Laterality Date   APPENDECTOMY  1966   CARDIAC CATHETERIZATION  05/2013   CARPAL TUNNEL RELEASE Right 08/08/2014   Procedure: OPEN CARPAL TUNNEL RELEASE;  Surgeon: Roseanne Kaufman, MD;  Location: [place];  Service: Orthopedics;  Laterality: Right;   CATARACT EXTRACTION W/ INTRAOCULAR LENS  IMPLANT, BILATERAL Bilateral    CHOLECYSTECTOMY OPEN  1966   COLONOSCOPY     FRACTURE SURGERY     JOINT REPLACEMENT     KNEE ARTHROPLASTY Left    ORIF WRIST FRACTURE Right 08/08/2014   Procedure: Right OPEN REDUCTION INTERNAL FIXATION (ORIF) WRIST FRACTURE WITH REPAIR AND RECONST/ALLOGRAFT AND BONE GRAFT;  Surgeon: Roseanne Kaufman, MD;  Location: [place];  Service: Orthopedics;  Laterality: Right;   TOTAL HIP ARTHROPLASTY Bilateral      OB History   No obstetric history on file.  Family History  Problem Relation Age of Onset   Sudden death Mother    Stroke Mother    Hypertension Neg Hx     Social History   Tobacco Use   Smoking status: Never   Smokeless tobacco: Never  Vaping Use   Vaping Use: Never used  Substance Use Topics   Alcohol use: Yes    Alcohol/week: 0.0 standard drinks    Comment: 11/09/2015 "might have glass of champaign on special occasion"   Drug use: No    Home Medications Prior to Admission medications   Medication Sig Start Date End Date Taking? Authorizing Provider  cephALEXin (KEFLEX) 500 MG capsule Take 1 capsule (500 mg total) by mouth 3 (three) times daily for 7 days. 03/16/21 03/23/21 Yes Dorie Rank, MD  ACCU-CHEK AVIVA PLUS test strip  TEST BS QID 12/24/17   [provider]  acetaminophen (TYLENOL) 325 MG tablet Take 650 mg by mouth every 6 (six) hours as needed for fever or mild pain.    [provider]  allopurinol (ZYLOPRIM) 100 MG tablet Take 100 mg by mouth daily. 0800 10/19/18   [provider]  aspirin EC 81 MG tablet Take 81 mg by mouth daily. 0800    [provider]  calcium carbonate (TUMS - DOSED IN MG ELEMENTAL CALCIUM) 500 MG chewable tablet Chew 1 tablet by mouth daily. 0800    [provider]  carvedilol (COREG CR) 80 MG 24 hr capsule TAKE 1 CAPSULE BY MOUTH  DAILY Patient taking differently: No sig reported 06/22/19   Richardson Dopp T, PA-C  Cholecalciferol (VITAMIN D) 50 MCG (2000 UT) CAPS Take 2,000 Units by mouth daily. 0800    [provider]  DULoxetine (CYMBALTA) 30 MG capsule Take 30 mg by mouth See admin instructions. Take on Tuesday, Thursday, Saturday, and Sunday. Do not crush. 0800. 09/21/18   [provider]  ezetimibe-simvastatin (VYTORIN) 10-40 MG tablet Take 1 tablet by mouth at bedtime. Patient taking differently: Take 1 tablet by mouth daily. 0800 08/04/19   Richardson Dopp T, PA-C  glucose 4 GM chewable tablet Chew 1 tablet by mouth 3 (three) times daily as needed for low blood sugar. Take if BS is less than 70. Recheck BS 20 mins after given tablet and record.    [provider]  guaiFENesin (ROBITUSSIN) 100 MG/5ML liquid Take 10 mLs by mouth every 4 (four) hours as needed for cough.    [provider]  insulin aspart (NOVOLOG) 100 UNIT/ML FlexPen Inject 20 Units into the skin See admin instructions. Inject SQ before each meal (0715, 1115, 1645). Hold if FSBS is less than 120; notify MD if less 50 or greater than 400.    [provider]  insulin glargine (LANTUS) 100 UNIT/ML Solostar Pen Inject 40 Units into the skin 2 (two) times daily. 0700, 2000.    [provider]  lisinopril (ZESTRIL) 2.5 MG tablet Take  2.5 mg by mouth daily. 0800    [provider]  magnesium oxide (MAG-OX) 400 MG tablet Take 1 tablet (400 mg total) by mouth daily. 02/14/21   Demaio, Alexa, MD  nystatin (MYCOSTATIN/NYSTOP) powder Apply 1 application topically 2 (two) times daily. Under breast until healed. 0800, 2000.    [provider]  OVER THE COUNTER MEDICATION Apply 1 application topically 3 (three) times daily. Apply small amount to buttocks. 0800, 1500, 2300.    [provider]  oxybutynin (DITROPAN) 5 MG tablet Take 2.5 mg by mouth 2 (  two) times daily. 0800, 2000.    [provider]  polyethylene glycol (MIRALAX / GLYCOLAX) 17 g packet Take 17 g by mouth 2 (two) times daily. 02/14/21   Demaio, Alexa, MD  potassium chloride SA (K-DUR,KLOR-CON) 20 MEQ tablet TAKE 1 TABLET(20 MEQ) BY MOUTH DAILY Patient taking differently: Take 20 mEq by mouth daily. Do not crush. 0800. 04/06/17   Richardson Dopp T, PA-C  senna-docusate (SENOKOT-S) 8.6-50 MG tablet Take 1 tablet by mouth at bedtime as needed for mild constipation. Patient taking differently: Take 1 tablet by mouth See admin instructions. Take on Monday and Wednesday. 0800. 12/21/18   Georgette Shell, MD  torsemide (DEMADEX) 20 MG tablet Take 2 tablets (40 mg total) by mouth daily. Please call and schedule an appt for further refills 3rd attempt Patient taking differently: Take 40 mg by mouth daily. 04/22/19   Fay Records, MD    Allergies    Patient has no known allergies.  Review of Systems   Review of Systems  All other systems reviewed and are negative.  Physical Exam Updated Vital Signs BP (!) 132/55 (BP Location: Right Arm)   Pulse 70   Temp 98 F (36.7 C) (Oral)   Resp (!) 21   Ht 1.626 m (5\' 4" )   Wt 90.7 kg   SpO2 97%   BMI 34.33 kg/m   Physical Exam Vitals and nursing note reviewed.  Constitutional:      General: She is not in acute distress.    Appearance: She is well-developed.  HENT:     Head: Normocephalic  and atraumatic.     Right Ear: External ear normal.     Left Ear: External ear normal.  Eyes:     General: No scleral icterus.       Right eye: No discharge.        Left eye: No discharge.     Conjunctiva/sclera: Conjunctivae normal.  Neck:     Trachea: No tracheal deviation.  Cardiovascular:     Rate and Rhythm: Normal rate and regular rhythm.  Pulmonary:     Effort: Pulmonary effort is normal. No respiratory distress.     Breath sounds: Normal breath sounds. No stridor. No wheezing or rales.  Abdominal:     General: Bowel sounds are normal. There is no distension.     Palpations: Abdomen is soft.     Tenderness: There is no abdominal tenderness. There is no guarding or rebound.  Musculoskeletal:        General: No tenderness.     Cervical back: Neck supple.  Skin:    General: Skin is warm and dry.     Findings: No rash.  Neurological:     Mental Status: She is alert and oriented to person, place, and time.     Cranial Nerves: No cranial nerve deficit (No facial droop, extraocular movements intact, tongue midline ).     Sensory: No sensory deficit.     Motor: No abnormal muscle tone or seizure activity.     Coordination: Coordination normal.     Comments: No pronator drift bilateral upper extrem, able to hold both legs off bed for 5 seconds, sensation intact in all extremities, no visual field cuts, no left or right sided neglect, normal finger-nose exam bilaterally, no nystagmus noted     ED Results / Procedures / Treatments   Labs (all labs ordered are listed, but only abnormal results are displayed) Labs Reviewed  APTT - Abnormal; Notable for  the following components:      Result Value   aPTT 22 (*)    All other components within normal limits  CBC - Abnormal; Notable for the following components:   RBC 3.46 (*)    Hemoglobin 10.1 (*)    HCT 34.1 (*)    MCHC 29.6 (*)    All other components within normal limits  COMPREHENSIVE METABOLIC PANEL - Abnormal; Notable  for the following components:   Glucose, Bld 255 (*)    BUN 53 (*)    Creatinine, Ser 1.75 (*)    Total Protein 6.4 (*)    Albumin 3.1 (*)    GFR, Estimated 28 (*)    All other components within normal limits  URINALYSIS, ROUTINE W REFLEX MICROSCOPIC - Abnormal; Notable for the following components:   Nitrite POSITIVE (*)    Leukocytes,Ua SMALL (*)    Bacteria, UA RARE (*)    All other components within normal limits  I-STAT CHEM 8, ED - Abnormal; Notable for the following components:   BUN 50 (*)    Creatinine, Ser 1.70 (*)    Glucose, Bld 248 (*)    Calcium, Ion 1.12 (*)    Hemoglobin 10.5 (*)    HCT 31.0 (*)    All other components within normal limits  RESP PANEL BY RT-PCR (FLU A&B, COVID) ARPGX2  URINE CULTURE  ETHANOL  PROTIME-INR  DIFFERENTIAL  RAPID URINE DRUG SCREEN, HOSP PERFORMED    EKG EKG Interpretation  Date/Time:  Saturday March 16 2021 11:28:13 EST Ventricular Rate:  71 PR Interval:  221 QRS Duration: 161 QT Interval:  468 QTC Calculation: 509 R Axis:   -83 Text Interpretation: Sinus rhythm Prolonged PR interval RBBB and LAFB Left ventricular hypertrophy No significant change since last tracing Confirmed by Dorie Rank [phone]) on 03/16/2021 11:36:19 AM  Radiology CT HEAD WO CONTRAST  Result Date: 03/16/2021 CLINICAL DATA:  Neuro deficit, stroke suspected, multiple falls EXAM: CT HEAD WITHOUT CONTRAST TECHNIQUE: Contiguous axial images were obtained from the base of the skull through the vertex without intravenous contrast. COMPARISON:  02/13/2021 FINDINGS: Brain: No evidence of acute infarction, hemorrhage, hydrocephalus, extra-axial collection or mass lesion/mass effect. Periventricular and deep white matter hypodensity. Vascular: No hyperdense vessel or unexpected calcification. Skull: Normal. Negative for fracture or focal lesion. Sinuses/Orbits: No acute finding. Other: None. IMPRESSION: No acute intracranial pathology. Small-vessel white matter  disease. Electronically Signed   By: Delanna Ahmadi M.D.   On: 03/16/2021 13:28    Procedures Procedures   Medications Ordered in ED Medications - No data to display  ED Course  I have reviewed the triage vital signs and the nursing notes.  Pertinent labs & imaging results that were available during my care of the patient were reviewed by me and considered in my medical decision making (see chart for details).  Clinical Course as of 03/16/21 1455  Sat Mar 16, 2021  1328 Hemoglobin is stable compared to previous [JK]  1328 BUN and creatinine elevated but similar compared to previous [JK]  1435 Head CT without acute findings [JK]  1450 Reviewed the findings with the patient's daughter.  They would prefer discharge back to the nursing facility if possible. [JK]    Clinical Course User Index [JK] Dorie Rank, MD   MDM Rules/Calculators/A&P                           Patient presented to the ED for evaluation of  falls.  Patient denies any specific complaints and states she stumbled.  Patient does have history of dementia but is able to communicate very clearly.  ED work-up overall reassuring.  No definite signs of acute stroke.  No signs of dehydration.  There is possible urinary tract infection.  There is a foul odor to the patient's urine but she is denying any specific symptoms.  Unclear whether she really has any urinary tract infection but with the malodorous urine I will give her prescription for antibiotics and send off on a culture.  Discussed case with the patient's daughter.  We will plan on discharge home.  At this time doubt occult stroke Final Clinical Impression(s) / ED Diagnoses Final diagnoses:  Fall, initial encounter  Bacteria in urine    Rx / DC Orders ED Discharge Orders          Ordered    cephALEXin (KEFLEX) 500 MG capsule  3 times daily        03/16/21 1453             Dorie Rank, MD 03/16/21 1455

## 2021-03-17 ENCOUNTER — Encounter (HOSPITAL_COMMUNITY): Payer: Self-pay | Admitting: Emergency Medicine

## 2021-03-17 ENCOUNTER — Emergency Department (HOSPITAL_COMMUNITY)
Admission: EM | Admit: 2021-03-17 | Discharge: 2021-03-18 | Disposition: A | Discharge status: Skilled Nursing Facility | Payer: Medicare HMO | Source: Home / Self Care | Attending: Emergency Medicine | Admitting: Emergency Medicine

## 2021-03-17 ENCOUNTER — Emergency Department (HOSPITAL_COMMUNITY): Payer: Medicare HMO

## 2021-03-17 ENCOUNTER — Other Ambulatory Visit: Payer: Self-pay

## 2021-03-17 DIAGNOSIS — Z794 Long term (current) use of insulin: Secondary | ICD-10-CM | POA: Insufficient documentation

## 2021-03-17 DIAGNOSIS — I5032 Chronic diastolic (congestive) heart failure: Secondary | ICD-10-CM | POA: Insufficient documentation

## 2021-03-17 DIAGNOSIS — Z96652 Presence of left artificial knee joint: Secondary | ICD-10-CM | POA: Insufficient documentation

## 2021-03-17 DIAGNOSIS — Z79899 Other long term (current) drug therapy: Secondary | ICD-10-CM | POA: Insufficient documentation

## 2021-03-17 DIAGNOSIS — S0081XA Abrasion of other part of head, initial encounter: Secondary | ICD-10-CM | POA: Insufficient documentation

## 2021-03-17 DIAGNOSIS — N183 Chronic kidney disease, stage 3 unspecified: Secondary | ICD-10-CM | POA: Insufficient documentation

## 2021-03-17 DIAGNOSIS — W01198A Fall on same level from slipping, tripping and stumbling with subsequent striking against other object, initial encounter: Secondary | ICD-10-CM | POA: Insufficient documentation

## 2021-03-17 DIAGNOSIS — Z96643 Presence of artificial hip joint, bilateral: Secondary | ICD-10-CM | POA: Insufficient documentation

## 2021-03-17 DIAGNOSIS — Z85820 Personal history of malignant melanoma of skin: Secondary | ICD-10-CM | POA: Insufficient documentation

## 2021-03-17 DIAGNOSIS — E1122 Type 2 diabetes mellitus with diabetic chronic kidney disease: Secondary | ICD-10-CM | POA: Insufficient documentation

## 2021-03-17 DIAGNOSIS — Z7982 Long term (current) use of aspirin: Secondary | ICD-10-CM | POA: Insufficient documentation

## 2021-03-17 DIAGNOSIS — Z23 Encounter for immunization: Secondary | ICD-10-CM | POA: Insufficient documentation

## 2021-03-17 DIAGNOSIS — I130 Hypertensive heart and chronic kidney disease with heart failure and stage 1 through stage 4 chronic kidney disease, or unspecified chronic kidney disease: Secondary | ICD-10-CM | POA: Insufficient documentation

## 2021-03-17 DIAGNOSIS — S0001XA Abrasion of scalp, initial encounter: Secondary | ICD-10-CM

## 2021-03-17 DIAGNOSIS — S0990XA Unspecified injury of head, initial encounter: Secondary | ICD-10-CM

## 2021-03-17 DIAGNOSIS — Z7984 Long term (current) use of oral hypoglycemic drugs: Secondary | ICD-10-CM | POA: Insufficient documentation

## 2021-03-17 LAB — CBC WITH DIFFERENTIAL/PLATELET
Abs Immature Granulocytes: 0.07 10*3/uL (ref 0.00–0.07)
Basophils Absolute: 0 10*3/uL (ref 0.0–0.1)
Basophils Relative: 0 %
Eosinophils Absolute: 0.4 10*3/uL (ref 0.0–0.5)
Eosinophils Relative: 4 %
HCT: 41 % (ref 36.0–46.0)
Hemoglobin: 12.5 g/dL (ref 12.0–15.0)
Immature Granulocytes: 1 %
Lymphocytes Relative: 22 %
Lymphs Abs: 2.3 10*3/uL (ref 0.7–4.0)
MCH: 30 pg (ref 26.0–34.0)
MCHC: 30.5 g/dL (ref 30.0–36.0)
MCV: 98.3 fL (ref 80.0–100.0)
Monocytes Absolute: 1 10*3/uL (ref 0.1–1.0)
Monocytes Relative: 9 %
Neutro Abs: 6.7 10*3/uL (ref 1.7–7.7)
Neutrophils Relative %: 64 %
Platelets: 199 10*3/uL (ref 150–400)
RBC: 4.17 MIL/uL (ref 3.87–5.11)
RDW: 15.1 % (ref 11.5–15.5)
WBC: 10.4 10*3/uL (ref 4.0–10.5)
nRBC: 0 % (ref 0.0–0.2)

## 2021-03-17 LAB — COMPREHENSIVE METABOLIC PANEL
ALT: 25 U/L (ref 0–44)
AST: 31 U/L (ref 15–41)
Albumin: 3.5 g/dL (ref 3.5–5.0)
Alkaline Phosphatase: 86 U/L (ref 38–126)
Anion gap: 9 (ref 5–15)
BUN: 49 mg/dL — ABNORMAL HIGH (ref 8–23)
CO2: 26 mmol/L (ref 22–32)
Calcium: 9.3 mg/dL (ref 8.9–10.3)
Chloride: 105 mmol/L (ref 98–111)
Creatinine, Ser: 1.69 mg/dL — ABNORMAL HIGH (ref 0.44–1.00)
GFR, Estimated: 29 mL/min — ABNORMAL LOW (ref >60)
Glucose, Bld: 198 mg/dL — ABNORMAL HIGH (ref 70–99)
Potassium: 5.3 mmol/L — ABNORMAL HIGH (ref 3.5–5.1)
Sodium: 140 mmol/L (ref 135–145)
Total Bilirubin: 0.8 mg/dL (ref 0.3–1.2)
Total Protein: 7.4 g/dL (ref 6.5–8.1)

## 2021-03-17 MED ORDER — TETANUS-DIPHTH-ACELL PERTUSSIS 5-2.5-18.5 LF-MCG/0.5 IM SUSY
0.5000 mL | PREFILLED_SYRINGE | Freq: Once | INTRAMUSCULAR | Status: AC
  Administered 2021-03-17: 22:00:00 0.5 mL via INTRAMUSCULAR
  Filled 2021-03-17: qty 0.5

## 2021-03-17 NOTE — ED Notes (Signed)
[facility] called  

## 2021-03-17 NOTE — ED Triage Notes (Signed)
Pt from [facility] via EMS after reported trip and fall striking left head on floor.  Family is out of town but were contacted by paramedics to verify pt is NOT on blood thinners.  Pt was seen here yesterday for increased weakness and increase in falls and began on tx for UTI.  Pt has hx of dementia at baseline. No complaints of pain at this time.

## 2021-03-17 NOTE — ED Provider Notes (Signed)
[facility] Provider Note   CSN: [id number] Arrival date & time: 03/17/21  1929     History Chief Complaint  Patient presents with   Head Injury   Fall    Brandi Melton is a 87 y.o. female hx of CKD, HL, HTN, DM here presenting with fall with head injury.  Patient states that she was walking in her home and her foot got tangled up and she fell and hit her head.  She denies passing out.  She was seen in the ED yesterday was diagnosed with UTI and discharged back to facility with antibiotics.  Denies any abdominal pain or fevers.  She was noted to have dried blood in the left scalp area. Unknown tetanus   The history is provided by the patient.      Past Medical History:  Diagnosis Date   Arthritis    "hips before they were replaced, right knee now" (11/09/2015)   Chronic diastolic (congestive) heart failure ([place])    a. echo 06/2015: EF 65-70%, Grade 1 DD, mitral valve calcification.   CKD (chronic kidney disease), stage III (HCC)    Depression    Dyspnea    H/O cardiac catheterization    a. h/o cardiac catheterization 05/2013 in [place] showing only minimal CAD with increased filling pressure.   Hyperlipemia    Hypertensive heart disease    Morbid obesity ([place])    Palsy, Bell's    RA (rheumatoid arthritis) ([place])    Sleep apnea    "suppose to wear mask; couldn't" (11/09/2015)   Type II diabetes mellitus ([place])     Patient Active Problem List   Diagnosis Date Noted   Acute metabolic encephalopathy 33/29/5188   AKI (acute kidney injury) ([place]) 02/13/2021   Pressure injury of coccygeal region, stage 2 ([place]) 02/13/2021   Nodule of lower lobe of right lung 02/13/2021   Hx of malignant melanoma of skin 02/13/2021   Pressure injury of skin 12/20/2018   Gout 02/19/2016   H/O cardiac catheterization    Hypertensive heart disease with CHF (congestive heart failure) (HCC)    Hypoglycemia due to insulin    CKD (chronic kidney disease), stage  III (HCC)    Chronic diastolic CHF (congestive heart failure) ([place])    Morbid obesity ([place])    Hypertension    Hyperlipemia    Sleep apnea    Diabetes mellitus type 2, insulin dependent ([place]) 08/10/2014    Past Surgical History:  Procedure Laterality Date   APPENDECTOMY  1966   CARDIAC CATHETERIZATION  05/2013   CARPAL TUNNEL RELEASE Right 08/08/2014   Procedure: OPEN CARPAL TUNNEL RELEASE;  Surgeon: Roseanne Kaufman, MD;  Location: [facility];  Service: Orthopedics;  Laterality: Right;   CATARACT EXTRACTION W/ INTRAOCULAR LENS  IMPLANT, BILATERAL Bilateral    CHOLECYSTECTOMY OPEN  1966   COLONOSCOPY     FRACTURE SURGERY     JOINT REPLACEMENT     KNEE ARTHROPLASTY Left    ORIF WRIST FRACTURE Right 08/08/2014   Procedure: Right OPEN REDUCTION INTERNAL FIXATION (ORIF) WRIST FRACTURE WITH REPAIR AND RECONST/ALLOGRAFT AND BONE GRAFT;  Surgeon: Roseanne Kaufman, MD;  Location: [facility];  Service: Orthopedics;  Laterality: Right;   TOTAL HIP ARTHROPLASTY Bilateral      OB History   No obstetric history on file.     Family History  Problem Relation Age of Onset   Sudden death Mother    Stroke Mother    Hypertension Neg Hx  Social History   Tobacco Use   Smoking status: Never   Smokeless tobacco: Never  Vaping Use   Vaping Use: Never used  Substance Use Topics   Alcohol use: Yes    Alcohol/week: 0.0 standard drinks    Comment: 11/09/2015 "might have glass of champaign on special occasion"   Drug use: No    Home Medications Prior to Admission medications   Medication Sig Start Date End Date Taking? Authorizing Provider  ACCU-CHEK AVIVA PLUS test strip TEST BS QID 12/24/17   [provider]  acetaminophen (TYLENOL) 325 MG tablet Take 650 mg by mouth every 6 (six) hours as needed for fever or mild pain.    [provider]  allopurinol (ZYLOPRIM) 100 MG tablet Take 100 mg by mouth daily. 0800 10/19/18   [provider]  aspirin EC 81 MG tablet Take 81 mg by  mouth daily. 0800    [provider]  calcium carbonate (TUMS - DOSED IN MG ELEMENTAL CALCIUM) 500 MG chewable tablet Chew 1 tablet by mouth daily. 0800    [provider]  carvedilol (COREG CR) 80 MG 24 hr capsule TAKE 1 CAPSULE BY MOUTH  DAILY Patient taking differently: No sig reported 06/22/19   Richardson Dopp T, PA-C  cephALEXin (KEFLEX) 500 MG capsule Take 1 capsule (500 mg total) by mouth 3 (three) times daily for 7 days. 03/16/21 03/23/21  Dorie Rank, MD  Cholecalciferol (VITAMIN D) 50 MCG (2000 UT) CAPS Take 2,000 Units by mouth daily. 0800    [provider]  DULoxetine (CYMBALTA) 30 MG capsule Take 30 mg by mouth See admin instructions. Take on Tuesday, Thursday, Saturday, and Sunday. Do not crush. 0800. 09/21/18   [provider]  ezetimibe-simvastatin (VYTORIN) 10-40 MG tablet Take 1 tablet by mouth at bedtime. Patient taking differently: Take 1 tablet by mouth daily. 0800 08/04/19   Richardson Dopp T, PA-C  glucose 4 GM chewable tablet Chew 1 tablet by mouth 3 (three) times daily as needed for low blood sugar. Take if BS is less than 70. Recheck BS 20 mins after given tablet and record.    [provider]  guaiFENesin (ROBITUSSIN) 100 MG/5ML liquid Take 10 mLs by mouth every 4 (four) hours as needed for cough.    [provider]  insulin aspart (NOVOLOG) 100 UNIT/ML FlexPen Inject 20 Units into the skin See admin instructions. Inject SQ before each meal (0715, 1115, 1645). Hold if FSBS is less than 120; notify MD if less 50 or greater than 400.    [provider]  insulin glargine (LANTUS) 100 UNIT/ML Solostar Pen Inject 40 Units into the skin 2 (two) times daily. 0700, 2000.    [provider]  lisinopril (ZESTRIL) 2.5 MG tablet Take 2.5 mg by mouth daily. 0800    [provider]  magnesium oxide (MAG-OX) 400 MG tablet Take 1 tablet (400 mg total) by mouth daily. 02/14/21   Demaio, Alexa, MD  nystatin  (MYCOSTATIN/NYSTOP) powder Apply 1 application topically 2 (two) times daily. Under breast until healed. 0800, 2000.    [provider]  OVER THE COUNTER MEDICATION Apply 1 application topically 3 (three) times daily. Apply small amount to buttocks. 0800, 1500, 2300.    [provider]  oxybutynin (DITROPAN) 5 MG tablet Take 2.5 mg by mouth 2 (two) times daily. 0800, 2000.    [provider]  polyethylene glycol (MIRALAX / GLYCOLAX) 17 g packet Take 17 g by mouth 2 (two) times  daily. 02/14/21   Demaio, Alexa, MD  potassium chloride SA (K-DUR,KLOR-CON) 20 MEQ tablet TAKE 1 TABLET(20 MEQ) BY MOUTH DAILY Patient taking differently: Take 20 mEq by mouth daily. Do not crush. 0800. 04/06/17   Richardson Dopp T, PA-C  senna-docusate (SENOKOT-S) 8.6-50 MG tablet Take 1 tablet by mouth at bedtime as needed for mild constipation. Patient taking differently: Take 1 tablet by mouth See admin instructions. Take on Monday and Wednesday. 0800. 12/21/18   Georgette Shell, MD  torsemide (DEMADEX) 20 MG tablet Take 2 tablets (40 mg total) by mouth daily. Please call and schedule an appt for further refills 3rd attempt Patient taking differently: Take 40 mg by mouth daily. 04/22/19   Fay Records, MD    Allergies    Patient has no known allergies.  Review of Systems   Review of Systems  Skin:  Positive for wound.  All other systems reviewed and are negative.  Physical Exam Updated Vital Signs BP (!) 156/74   Pulse 66   Temp 98.2 F (36.8 C)   Resp 18   SpO2 100%   Physical Exam Vitals and nursing note reviewed.  Constitutional:      Appearance: Normal appearance.  HENT:     Head: Normocephalic.     Comments: Abrasion on the left temple area.  There is dried blood left side of her hair but there is no obvious laceration inside the hairline    Nose: Nose normal.     Mouth/Throat:     Mouth: Mucous membranes are moist.  Eyes:     Extraocular Movements: Extraocular  movements intact.     Pupils: Pupils are equal, round, and reactive to light.  Cardiovascular:     Rate and Rhythm: Normal rate and regular rhythm.     Pulses: Normal pulses.     Heart sounds: Normal heart sounds.  Pulmonary:     Effort: Pulmonary effort is normal.     Breath sounds: Normal breath sounds.  Abdominal:     General: Abdomen is flat.     Palpations: Abdomen is soft.  Musculoskeletal:        General: Normal range of motion.     Cervical back: Normal range of motion and neck supple.     Comments: No spinal tenderness.  Patient has old bruises on bilateral arms and legs but no obvious deformity.  Skin:    General: Skin is warm.     Capillary Refill: Capillary refill takes less than 2 seconds.  Neurological:     General: No focal deficit present.     Mental Status: She is alert and oriented to person, place, and time.  Psychiatric:        Mood and Affect: Mood normal.        Behavior: Behavior normal.    ED Results / Procedures / Treatments   Labs (all labs ordered are listed, but only abnormal results are displayed) Labs Reviewed  COMPREHENSIVE METABOLIC PANEL - Abnormal; Notable for the following components:      Result Value   Potassium 5.3 (*)    Glucose, Bld 198 (*)    BUN 49 (*)    Creatinine, Ser 1.69 (*)    GFR, Estimated 29 (*)    All other components within normal limits  CBC WITH DIFFERENTIAL/PLATELET    EKG None  Radiology DG Chest 2 View  Result Date: 03/17/2021 CLINICAL DATA:  hypoxia EXAM: CHEST - 2 VIEW COMPARISON:  February 13, 2021 FINDINGS: The  cardiomediastinal silhouette is unchanged in contour.Atherosclerotic calcifications. Low lung volumes. No pleural effusion. No pneumothorax. No acute pleuroparenchymal abnormality. Visualized abdomen is unremarkable. Multilevel degenerative changes of the thoracic spine. Unchanged mild wedging of the T10 vertebral body. IMPRESSION: No acute cardiopulmonary abnormality. Electronically Signed   By:  Valentino Saxon M.D.   On: 03/17/2021 20:45   CT Head Wo Contrast  Result Date: 03/17/2021 CLINICAL DATA:  Fall with trauma to the left side. EXAM: CT HEAD WITHOUT CONTRAST TECHNIQUE: Contiguous axial images were obtained from the base of the skull through the vertex without intravenous contrast. COMPARISON:  03/16/2021 FINDINGS: Brain: Generalized atrophy. Chronic small-vessel ischemic changes of the cerebral hemispheric white matter. Old small vessel infarctions of the thalami and basal ganglia. No sign of acute infarction, mass lesion, hemorrhage, hydrocephalus or extra-axial collection. Vascular: There is atherosclerotic calcification of the major vessels at the base of the brain. Skull: No skull fracture. Sinuses/Orbits: No traumatic fluid in the sinuses. Some mucosal inflammatory changes of the sphenoid sinus. Periorbital and left forehead soft tissue swelling. Other: None IMPRESSION: No acute or traumatic intracranial finding. Atrophy and extensive chronic small-vessel ischemic changes. Soft tissue swelling of the left forehead and periorbital region without evidence of regional fracture. Electronically Signed   By: Nelson Chimes M.D.   On: 03/17/2021 20:30   CT HEAD WO CONTRAST  Result Date: 03/16/2021 CLINICAL DATA:  Neuro deficit, stroke suspected, multiple falls EXAM: CT HEAD WITHOUT CONTRAST TECHNIQUE: Contiguous axial images were obtained from the base of the skull through the vertex without intravenous contrast. COMPARISON:  02/13/2021 FINDINGS: Brain: No evidence of acute infarction, hemorrhage, hydrocephalus, extra-axial collection or mass lesion/mass effect. Periventricular and deep white matter hypodensity. Vascular: No hyperdense vessel or unexpected calcification. Skull: Normal. Negative for fracture or focal lesion. Sinuses/Orbits: No acute finding. Other: None. IMPRESSION: No acute intracranial pathology. Small-vessel white matter disease. Electronically Signed   By: Delanna Ahmadi  M.D.   On: 03/16/2021 13:28   CT Cervical Spine Wo Contrast  Result Date: 03/17/2021 CLINICAL DATA:  [place] with trauma to the head and neck EXAM: CT CERVICAL SPINE WITHOUT CONTRAST TECHNIQUE: Multidetector CT imaging of the cervical spine was performed without intravenous contrast. Multiplanar CT image reconstructions were also generated. COMPARISON:  None. FINDINGS: Alignment: No traumatic malalignment. Straightening of the normal cervical lordosis. 3 mm chronic appearing degenerative anterolisthesis at C3-4. Skull base and vertebrae: No evidence of fracture or focal bone lesion. Chronic fusion of the posterior elements at C2-3, from C4 through C6 and C7-T1. Soft tissues and spinal canal: No soft tissue lesion. Disc levels: Chronic degenerative changes at the C1-2 articulation. Chronic posterior element fusion at C2-3. Chronic facet arthropathy at C3-4 with 3 mm of anterolisthesis. Chronic degenerative spondylosis at C4-5, C5-6 and C6-7. Moderate canal narrowing at C5-6 due to bony encroachment. Upper chest: Negative Other: None IMPRESSION: No acute or traumatic finding. Chronic posterior element fusion at C2-3, from C4 through C6, and at C7-T1. Electronically Signed   By: Nelson Chimes M.D.   On: 03/17/2021 20:28   CT Maxillofacial WO CM  Result Date: 03/17/2021 CLINICAL DATA:  Fall with trauma to the left side of the face and head. EXAM: CT MAXILLOFACIAL WITHOUT CONTRAST TECHNIQUE: Multidetector CT imaging of the maxillofacial structures was performed. Multiplanar CT image reconstructions were also generated. COMPARISON:  None. FINDINGS: Osseous: No facial fracture. Orbits: No evidence of intraorbital injury. Periorbital soft tissue swelling on the left. Sinuses: Clear except for small amount of mucosal thickening along  the left maxillary sinus floor. Soft tissues: Mild soft tissue swelling of the left side of the face and periorbital region. Limited intracranial: Atrophy and chronic small-vessel ischemic  changes. IMPRESSION: No facial fracture. Left facial, forehead and superficial periorbital soft tissue swelling. Electronically Signed   By: Nelson Chimes M.D.   On: 03/17/2021 20:26    Procedures Procedures     Medications Ordered in ED Medications  Tdap (BOOSTRIX) injection 0.5 mL (has no administration in time range)    ED Course  I have reviewed the triage vital signs and the nursing notes.  Pertinent labs & imaging results that were available during my care of the patient were reviewed by me and considered in my medical decision making (see chart for details).    MDM Rules/Calculators/A&P                           TERRISHA LOPATA is a 87 y.o. female here presenting with a fall.  Patient had a mechanical fall and hit the left forehead.  Patient does have an abrasion and was able to put Steri-Strips on it and the bleeding was controlled.  Patient has old bruises on extremities but no obvious bony tenderness and patient was ambulatory.  We will get CT head and cervical spine and update tetanus.   10:10 PM CT head and neck unremarkable.  Labs showed creatinine 1.7 which is baseline. Potassium is 5.3 likely hemolyzed.  I updated the son-in-law.  Stable for discharge   Final Clinical Impression(s) / ED Diagnoses Final diagnoses:  None    Rx / DC Orders ED Discharge Orders     None        Drenda Freeze, MD 03/17/21 2210

## 2021-03-17 NOTE — ED Provider Notes (Addendum)
Emergency Medicine Provider Triage Evaluation Note  Brandi [facility]ton , a 87 y.o. female  was evaluated in triage.  Pt complains of a fall earlier today.  She comes from [place] home.  She had a fall today where she tripped over one of the straps on the floor.  She landed on her head.  She has no neurological deficits, however is reported to be less interactive than normal.  She has dementia at baseline.  It is verified via family that she is not on blood thinners. She does not normally wear O2, but EMS placed her on 2 L due to hypoxia to 88%.  She was actually seen here yesterday for increased weakness and increased number of falls.  She was sent home with treatment for UTI.  Review of Systems  Positive: See above Negative:   Physical Exam  There were no vitals taken for this visit. Gen:   Awake, no distress   Resp:  Normal effort  MSK:   Moves extremities without difficulty  Other:  Dried blood on left side of head. Unable to visualize if there is an underlying laceration. No active bleeding. She is at her mental baseline other than be less interactive. Follows commands. Normal motor. Speech clear.  Medical Decision Making  Medically screening exam initiated at 7:45 PM.  Appropriate orders placed.  Brandi Melton was informed that the remainder of the evaluation will be completed by another provider, this initial triage assessment does not replace that evaluation, and the importance of remaining in the ED until their evaluation is complete.    Brandi Birchwood, PA-C 03/17/21 1947    Brandi Melton 03/17/21 1949    Carmin Muskrat, MD 03/17/21 2029

## 2021-03-17 NOTE — Discharge Instructions (Signed)
You have an abrasion of your scalp.  I applied Steri-Strips and it will fall off by itself   Fall precautions at the facility   See your doctor for follow up. Continue your current meds   Return to ER if you have another fall, uncontrolled bleeding, vomiting, headaches

## 2021-03-18 NOTE — ED Notes (Signed)
Pt belongings was left at the bedside. Placed label on bag and placed in purple zone on counter.

## 2021-03-19 ENCOUNTER — Other Ambulatory Visit: Payer: Self-pay

## 2021-03-19 ENCOUNTER — Encounter (HOSPITAL_COMMUNITY): Payer: Self-pay

## 2021-03-19 ENCOUNTER — Observation Stay (HOSPITAL_COMMUNITY): Payer: Medicare HMO

## 2021-03-19 ENCOUNTER — Other Ambulatory Visit (HOSPITAL_COMMUNITY): Payer: Medicare HMO

## 2021-03-19 ENCOUNTER — Other Ambulatory Visit (HOSPITAL_COMMUNITY): Payer: Self-pay

## 2021-03-19 ENCOUNTER — Emergency Department (HOSPITAL_COMMUNITY): Payer: Medicare HMO

## 2021-03-19 ENCOUNTER — Inpatient Hospital Stay (HOSPITAL_COMMUNITY)
Admission: EM | Admit: 2021-03-19 | Discharge: 2021-03-28 | DRG: 064 | Disposition: A | Discharge status: Skilled Nursing Facility | Payer: Medicare HMO | Source: Skilled Nursing Facility | Attending: Student in an Organized Health Care Education/Training Program | Admitting: Student in an Organized Health Care Education/Training Program

## 2021-03-19 DIAGNOSIS — Z20822 Contact with and (suspected) exposure to covid-19: Secondary | ICD-10-CM | POA: Diagnosis present

## 2021-03-19 DIAGNOSIS — N179 Acute kidney failure, unspecified: Secondary | ICD-10-CM | POA: Diagnosis not present

## 2021-03-19 DIAGNOSIS — E1122 Type 2 diabetes mellitus with diabetic chronic kidney disease: Secondary | ICD-10-CM | POA: Diagnosis present

## 2021-03-19 DIAGNOSIS — L89152 Pressure ulcer of sacral region, stage 2: Secondary | ICD-10-CM | POA: Diagnosis present

## 2021-03-19 DIAGNOSIS — E785 Hyperlipidemia, unspecified: Secondary | ICD-10-CM | POA: Diagnosis present

## 2021-03-19 DIAGNOSIS — I639 Cerebral infarction, unspecified: Secondary | ICD-10-CM | POA: Diagnosis present

## 2021-03-19 DIAGNOSIS — Z96652 Presence of left artificial knee joint: Secondary | ICD-10-CM | POA: Diagnosis present

## 2021-03-19 DIAGNOSIS — B9620 Unspecified Escherichia coli [E. coli] as the cause of diseases classified elsewhere: Secondary | ICD-10-CM | POA: Diagnosis present

## 2021-03-19 DIAGNOSIS — I10 Essential (primary) hypertension: Secondary | ICD-10-CM

## 2021-03-19 DIAGNOSIS — Z853 Personal history of malignant neoplasm of breast: Secondary | ICD-10-CM

## 2021-03-19 DIAGNOSIS — S0512XA Contusion of eyeball and orbital tissues, left eye, initial encounter: Secondary | ICD-10-CM | POA: Diagnosis present

## 2021-03-19 DIAGNOSIS — G8194 Hemiplegia, unspecified affecting left nondominant side: Secondary | ICD-10-CM | POA: Diagnosis present

## 2021-03-19 DIAGNOSIS — E119 Type 2 diabetes mellitus without complications: Secondary | ICD-10-CM

## 2021-03-19 DIAGNOSIS — N1832 Chronic kidney disease, stage 3b: Secondary | ICD-10-CM | POA: Diagnosis not present

## 2021-03-19 DIAGNOSIS — R29707 NIHSS score 7: Secondary | ICD-10-CM | POA: Diagnosis present

## 2021-03-19 DIAGNOSIS — Z823 Family history of stroke: Secondary | ICD-10-CM

## 2021-03-19 DIAGNOSIS — Z96643 Presence of artificial hip joint, bilateral: Secondary | ICD-10-CM | POA: Diagnosis present

## 2021-03-19 DIAGNOSIS — E7849 Other hyperlipidemia: Secondary | ICD-10-CM

## 2021-03-19 DIAGNOSIS — Z794 Long term (current) use of insulin: Secondary | ICD-10-CM

## 2021-03-19 DIAGNOSIS — Y92129 Unspecified place in nursing home as the place of occurrence of the external cause: Secondary | ICD-10-CM

## 2021-03-19 DIAGNOSIS — Z7982 Long term (current) use of aspirin: Secondary | ICD-10-CM

## 2021-03-19 DIAGNOSIS — I6340 Cerebral infarction due to embolism of unspecified cerebral artery: Secondary | ICD-10-CM

## 2021-03-19 DIAGNOSIS — I6349 Cerebral infarction due to embolism of other cerebral artery: Principal | ICD-10-CM | POA: Diagnosis present

## 2021-03-19 DIAGNOSIS — Z66 Do not resuscitate: Secondary | ICD-10-CM | POA: Diagnosis present

## 2021-03-19 DIAGNOSIS — F05 Delirium due to known physiological condition: Secondary | ICD-10-CM | POA: Diagnosis not present

## 2021-03-19 DIAGNOSIS — R4701 Aphasia: Secondary | ICD-10-CM | POA: Diagnosis present

## 2021-03-19 DIAGNOSIS — F0394 Unspecified dementia, unspecified severity, with anxiety: Secondary | ICD-10-CM | POA: Diagnosis present

## 2021-03-19 DIAGNOSIS — M109 Gout, unspecified: Secondary | ICD-10-CM | POA: Diagnosis present

## 2021-03-19 DIAGNOSIS — Z23 Encounter for immunization: Secondary | ICD-10-CM

## 2021-03-19 DIAGNOSIS — D631 Anemia in chronic kidney disease: Secondary | ICD-10-CM | POA: Diagnosis present

## 2021-03-19 DIAGNOSIS — S065XAA Traumatic subdural hemorrhage with loss of consciousness status unknown, initial encounter: Secondary | ICD-10-CM | POA: Diagnosis present

## 2021-03-19 DIAGNOSIS — R296 Repeated falls: Secondary | ICD-10-CM | POA: Diagnosis present

## 2021-03-19 DIAGNOSIS — N183 Chronic kidney disease, stage 3 unspecified: Secondary | ICD-10-CM | POA: Diagnosis present

## 2021-03-19 DIAGNOSIS — W010XXA Fall on same level from slipping, tripping and stumbling without subsequent striking against object, initial encounter: Secondary | ICD-10-CM | POA: Diagnosis present

## 2021-03-19 DIAGNOSIS — R29810 Facial weakness: Secondary | ICD-10-CM | POA: Diagnosis present

## 2021-03-19 DIAGNOSIS — E669 Obesity, unspecified: Secondary | ICD-10-CM | POA: Diagnosis present

## 2021-03-19 DIAGNOSIS — R4781 Slurred speech: Secondary | ICD-10-CM

## 2021-03-19 DIAGNOSIS — Z6834 Body mass index (BMI) 34.0-34.9, adult: Secondary | ICD-10-CM

## 2021-03-19 DIAGNOSIS — F0393 Unspecified dementia, unspecified severity, with mood disturbance: Secondary | ICD-10-CM | POA: Diagnosis present

## 2021-03-19 DIAGNOSIS — Z79899 Other long term (current) drug therapy: Secondary | ICD-10-CM

## 2021-03-19 DIAGNOSIS — I5032 Chronic diastolic (congestive) heart failure: Secondary | ICD-10-CM | POA: Diagnosis present

## 2021-03-19 DIAGNOSIS — I130 Hypertensive heart and chronic kidney disease with heart failure and stage 1 through stage 4 chronic kidney disease, or unspecified chronic kidney disease: Secondary | ICD-10-CM | POA: Diagnosis present

## 2021-03-19 DIAGNOSIS — R911 Solitary pulmonary nodule: Secondary | ICD-10-CM | POA: Diagnosis present

## 2021-03-19 DIAGNOSIS — N390 Urinary tract infection, site not specified: Secondary | ICD-10-CM | POA: Diagnosis present

## 2021-03-19 DIAGNOSIS — N184 Chronic kidney disease, stage 4 (severe): Secondary | ICD-10-CM | POA: Diagnosis present

## 2021-03-19 DIAGNOSIS — Z85820 Personal history of malignant melanoma of skin: Secondary | ICD-10-CM

## 2021-03-19 LAB — COMPREHENSIVE METABOLIC PANEL
ALT: 26 U/L (ref 0–44)
AST: 35 U/L (ref 15–41)
Albumin: 3.1 g/dL — ABNORMAL LOW (ref 3.5–5.0)
Alkaline Phosphatase: 84 U/L (ref 38–126)
Anion gap: 11 (ref 5–15)
BUN: 62 mg/dL — ABNORMAL HIGH (ref 8–23)
CO2: 22 mmol/L (ref 22–32)
Calcium: 8.9 mg/dL (ref 8.9–10.3)
Chloride: 107 mmol/L (ref 98–111)
Creatinine, Ser: 2.28 mg/dL — ABNORMAL HIGH (ref 0.44–1.00)
GFR, Estimated: 20 mL/min — ABNORMAL LOW (ref >60)
Glucose, Bld: 104 mg/dL — ABNORMAL HIGH (ref 70–99)
Potassium: 5.1 mmol/L (ref 3.5–5.1)
Sodium: 140 mmol/L (ref 135–145)
Total Bilirubin: 0.4 mg/dL (ref 0.3–1.2)
Total Protein: 6.6 g/dL (ref 6.5–8.1)

## 2021-03-19 LAB — CBC WITH DIFFERENTIAL/PLATELET
Abs Immature Granulocytes: 0.09 10*3/uL — ABNORMAL HIGH (ref 0.00–0.07)
Basophils Absolute: 0 10*3/uL (ref 0.0–0.1)
Basophils Relative: 0 %
Eosinophils Absolute: 0.2 10*3/uL (ref 0.0–0.5)
Eosinophils Relative: 2 %
HCT: 41.5 % (ref 36.0–46.0)
Hemoglobin: 12.1 g/dL (ref 12.0–15.0)
Immature Granulocytes: 1 %
Lymphocytes Relative: 25 %
Lymphs Abs: 2.9 10*3/uL (ref 0.7–4.0)
MCH: 29.7 pg (ref 26.0–34.0)
MCHC: 29.2 g/dL — ABNORMAL LOW (ref 30.0–36.0)
MCV: 102 fL — ABNORMAL HIGH (ref 80.0–100.0)
Monocytes Absolute: 1.2 10*3/uL — ABNORMAL HIGH (ref 0.1–1.0)
Monocytes Relative: 11 %
Neutro Abs: 7 10*3/uL (ref 1.7–7.7)
Neutrophils Relative %: 61 %
Platelets: 168 10*3/uL (ref 150–400)
RBC: 4.07 MIL/uL (ref 3.87–5.11)
RDW: 15.2 % (ref 11.5–15.5)
WBC: 11.5 10*3/uL — ABNORMAL HIGH (ref 4.0–10.5)
nRBC: 0 % (ref 0.0–0.2)

## 2021-03-19 LAB — HEMOGLOBIN A1C
Hgb A1c MFr Bld: 7.2 % — ABNORMAL HIGH (ref 4.8–5.6)
Mean Plasma Glucose: 159.94 mg/dL

## 2021-03-19 LAB — URINALYSIS, MICROSCOPIC (REFLEX)

## 2021-03-19 LAB — URINALYSIS, ROUTINE W REFLEX MICROSCOPIC
Bilirubin Urine: NEGATIVE
Glucose, UA: NEGATIVE mg/dL
Hgb urine dipstick: NEGATIVE
Ketones, ur: NEGATIVE mg/dL
Nitrite: NEGATIVE
Protein, ur: NEGATIVE mg/dL
Specific Gravity, Urine: 1.01 (ref 1.005–1.030)
pH: 6 (ref 5.0–8.0)

## 2021-03-19 LAB — LIPID PANEL
Cholesterol: 95 mg/dL (ref 0–200)
HDL: 25 mg/dL — ABNORMAL LOW (ref >40)
LDL Cholesterol: 53 mg/dL (ref 0–99)
Total CHOL/HDL Ratio: 3.8 RATIO
Triglycerides: 87 mg/dL (ref <150)
VLDL: 17 mg/dL (ref 0–40)

## 2021-03-19 LAB — URINE CULTURE: Culture: >=100000 — AB

## 2021-03-19 LAB — CBG MONITORING, ED: Glucose-Capillary: 105 mg/dL — ABNORMAL HIGH (ref 70–99)

## 2021-03-19 LAB — RESP PANEL BY RT-PCR (FLU A&B, COVID) ARPGX2
Influenza A by PCR: NEGATIVE
Influenza B by PCR: NEGATIVE
SARS Coronavirus 2 by RT PCR: NEGATIVE

## 2021-03-19 MED ORDER — NYSTATIN 100000 UNIT/GM EX POWD
1.0000 "application " | Freq: Two times a day (BID) | CUTANEOUS | Status: DC
  Administered 2021-03-20 – 2021-03-28 (×15): 1 via TOPICAL
  Filled 2021-03-19: qty 15

## 2021-03-19 MED ORDER — DULOXETINE HCL 30 MG PO CPEP
30.0000 mg | ORAL_CAPSULE | ORAL | Status: DC
  Administered 2021-03-23 – 2021-03-28 (×4): 30 mg via ORAL
  Filled 2021-03-19 (×6): qty 1

## 2021-03-19 MED ORDER — ACETAMINOPHEN 160 MG/5ML PO SOLN: 650.0000 mg | ORAL | Status: DC | PRN

## 2021-03-19 MED ORDER — HEPARIN SODIUM (PORCINE) 5000 UNIT/ML IJ SOLN
5000.0000 [IU] | Freq: Three times a day (TID) | INTRAMUSCULAR | Status: DC
  Administered 2021-03-20 – 2021-03-28 (×26): 5000 [IU] via SUBCUTANEOUS
  Filled 2021-03-19 (×25): qty 1

## 2021-03-19 MED ORDER — CALCIUM CARBONATE ANTACID 500 MG PO CHEW
1.0000 | CHEWABLE_TABLET | Freq: Every day | ORAL | Status: DC
Start: 2021-03-20 — End: 2021-03-28
  Administered 2021-03-20 – 2021-03-27 (×7): 200 mg via ORAL
  Filled 2021-03-19 (×7): qty 1

## 2021-03-19 MED ORDER — INSULIN ASPART 100 UNIT/ML IJ SOLN
0.0000 [IU] | Freq: Three times a day (TID) | INTRAMUSCULAR | Status: DC
  Administered 2021-03-20: 8 [IU] via SUBCUTANEOUS
  Administered 2021-03-20: 3 [IU] via SUBCUTANEOUS
  Administered 2021-03-20: 8 [IU] via SUBCUTANEOUS
  Administered 2021-03-21 (×2): 3 [IU] via SUBCUTANEOUS
  Administered 2021-03-21: 5 [IU] via SUBCUTANEOUS
  Administered 2021-03-22: 3 [IU] via SUBCUTANEOUS
  Administered 2021-03-22: 5 [IU] via SUBCUTANEOUS
  Administered 2021-03-22 – 2021-03-23 (×2): 3 [IU] via SUBCUTANEOUS
  Administered 2021-03-23: 5 [IU] via SUBCUTANEOUS
  Administered 2021-03-23: 8 [IU] via SUBCUTANEOUS
  Administered 2021-03-24: 5 [IU] via SUBCUTANEOUS
  Administered 2021-03-24: 8 [IU] via SUBCUTANEOUS
  Administered 2021-03-24 – 2021-03-25 (×3): 3 [IU] via SUBCUTANEOUS
  Administered 2021-03-25: 8 [IU] via SUBCUTANEOUS
  Administered 2021-03-26: 5 [IU] via SUBCUTANEOUS
  Administered 2021-03-26: 3 [IU] via SUBCUTANEOUS
  Administered 2021-03-26 – 2021-03-27 (×2): 5 [IU] via SUBCUTANEOUS
  Administered 2021-03-27 (×2): 3 [IU] via SUBCUTANEOUS
  Administered 2021-03-28: 5 [IU] via SUBCUTANEOUS

## 2021-03-19 MED ORDER — ASPIRIN 300 MG RE SUPP: 300.0000 mg | Freq: Every day | RECTAL | Status: DC

## 2021-03-19 MED ORDER — ACETAMINOPHEN 325 MG PO TABS
650.0000 mg | ORAL_TABLET | ORAL | Status: DC | PRN
  Administered 2021-03-26: 650 mg via ORAL
  Filled 2021-03-19: qty 2

## 2021-03-19 MED ORDER — ASPIRIN 325 MG PO TABS
325.0000 mg | ORAL_TABLET | Freq: Every day | ORAL | Status: DC
  Administered 2021-03-19 – 2021-03-20 (×2): 325 mg via ORAL
  Filled 2021-03-19 (×2): qty 1

## 2021-03-19 MED ORDER — STROKE: EARLY STAGES OF RECOVERY BOOK
Freq: Once | Status: AC
  Filled 2021-03-19: qty 1

## 2021-03-19 MED ORDER — ATORVASTATIN CALCIUM 40 MG PO TABS
80.0000 mg | ORAL_TABLET | Freq: Every day | ORAL | Status: DC
  Administered 2021-03-20 (×2): 80 mg via ORAL
  Filled 2021-03-19 (×2): qty 2

## 2021-03-19 MED ORDER — SODIUM CHLORIDE 0.9 % IV SOLN: Freq: Once | INTRAVENOUS | Status: AC

## 2021-03-19 MED ORDER — MAGNESIUM OXIDE -MG SUPPLEMENT 400 (240 MG) MG PO TABS
400.0000 mg | ORAL_TABLET | Freq: Every day | ORAL | Status: DC
  Administered 2021-03-20 – 2021-03-28 (×8): 400 mg via ORAL
  Filled 2021-03-19 (×9): qty 1

## 2021-03-19 MED ORDER — SENNOSIDES-DOCUSATE SODIUM 8.6-50 MG PO TABS
1.0000 | ORAL_TABLET | Freq: Every evening | ORAL | Status: DC | PRN
  Administered 2021-03-26: 1 via ORAL
  Filled 2021-03-19: qty 1

## 2021-03-19 MED ORDER — ALLOPURINOL 100 MG PO TABS
100.0000 mg | ORAL_TABLET | Freq: Every day | ORAL | Status: DC
  Administered 2021-03-20 – 2021-03-28 (×8): 100 mg via ORAL
  Filled 2021-03-19 (×9): qty 1

## 2021-03-19 MED ORDER — ENOXAPARIN SODIUM 30 MG/0.3ML IJ SOSY: 30.0000 mg | PREFILLED_SYRINGE | INTRAMUSCULAR | Status: DC

## 2021-03-19 MED ORDER — ACETAMINOPHEN 650 MG RE SUPP: 650.0000 mg | RECTAL | Status: DC | PRN

## 2021-03-19 MED ORDER — VITAMIN D 25 MCG (1000 UNIT) PO TABS
2000.0000 [IU] | ORAL_TABLET | Freq: Every day | ORAL | Status: DC
  Administered 2021-03-20 – 2021-03-28 (×8): 2000 [IU] via ORAL
  Filled 2021-03-19 (×8): qty 2

## 2021-03-19 MED ORDER — OXYBUTYNIN CHLORIDE 5 MG PO TABS
2.5000 mg | ORAL_TABLET | Freq: Two times a day (BID) | ORAL | Status: DC
  Administered 2021-03-20: 2.5 mg via ORAL
  Filled 2021-03-19: qty 1

## 2021-03-19 MED ORDER — ATORVASTATIN CALCIUM 40 MG PO TABS: 80.0000 mg | ORAL_TABLET | Freq: Every day | ORAL | Status: DC

## 2021-03-19 NOTE — ED Triage Notes (Signed)
PT was seen here 3 days ago for fall with stroke like symptoms with slurred speech and left side weakness, Facility sent her back today stating she has gotten worse, no new episodes, LKWT was 03/17/2021 0000 hours.

## 2021-03-19 NOTE — ED Provider Notes (Signed)
[facility] EMERGENCY DEPARTMENT Provider Note   CSN: [id number] Arrival date & time: 03/19/21  1503     History Chief Complaint  Patient presents with   Aphasia    Brandi Melton is a 87 y.o. female.  87 year old female with prior medical history as detailed below presents for evaluation.  Patient sent from her facility for evaluation of progressive slurring of her speech.  Last known normal was on December 4.  Patient appears to have progressive change in her speech over the last 48 hours.  Patient with noted left arm weakness as well.  Patient with history of dementia.  Level 5 caveat secondary to same.  She is unable to provide significant history when asked.  She does appear comfortable.  She denies pain.  Patient's daughter contacted by phone.  Case discussed.  DNR confirmed with the patient's daughter - Jabier Gauss.  The history is provided by the patient.  Illness Location:  Slurred speech, weakness Severity:  Moderate Onset quality:  Gradual Duration:  2 days Timing:  Constant Progression:  Worsening Chronicity:  New     Past Medical History:  Diagnosis Date   Arthritis    "hips before they were replaced, right knee now" (11/09/2015)   Chronic diastolic (congestive) heart failure ([place])    a. echo 06/2015: EF 65-70%, Grade 1 DD, mitral valve calcification.   CKD (chronic kidney disease), stage III (HCC)    Depression    Dyspnea    H/O cardiac catheterization    a. h/o cardiac catheterization 05/2013 in [place] showing only minimal CAD with increased filling pressure.   Hyperlipemia    Hypertensive heart disease    Morbid obesity ([place])    Palsy, Bell's    RA (rheumatoid arthritis) ([place])    Sleep apnea    "suppose to wear mask; couldn't" (11/09/2015)   Type II diabetes mellitus ([place])     Patient Active Problem List   Diagnosis Date Noted   Acute metabolic encephalopathy 56/81/2751   AKI (acute kidney injury) ([place]) 02/13/2021    Pressure injury of coccygeal region, stage 2 ([place]) 02/13/2021   Nodule of lower lobe of right lung 02/13/2021   Hx of malignant melanoma of skin 02/13/2021   Pressure injury of skin 12/20/2018   Gout 02/19/2016   H/O cardiac catheterization    Hypertensive heart disease with CHF (congestive heart failure) (HCC)    Hypoglycemia due to insulin    CKD (chronic kidney disease), stage III (HCC)    Chronic diastolic CHF (congestive heart failure) ([place])    Morbid obesity ([place])    Hypertension    Hyperlipemia    Sleep apnea    Diabetes mellitus type 2, insulin dependent ([place]) 08/10/2014    Past Surgical History:  Procedure Laterality Date   APPENDECTOMY  1966   CARDIAC CATHETERIZATION  05/2013   CARPAL TUNNEL RELEASE Right 08/08/2014   Procedure: OPEN CARPAL TUNNEL RELEASE;  Surgeon: Roseanne Kaufman, MD;  Location: [place];  Service: Orthopedics;  Laterality: Right;   CATARACT EXTRACTION W/ INTRAOCULAR LENS  IMPLANT, BILATERAL Bilateral    CHOLECYSTECTOMY OPEN  1966   COLONOSCOPY     FRACTURE SURGERY     JOINT REPLACEMENT     KNEE ARTHROPLASTY Left    ORIF WRIST FRACTURE Right 08/08/2014   Procedure: Right OPEN REDUCTION INTERNAL FIXATION (ORIF) WRIST FRACTURE WITH REPAIR AND RECONST/ALLOGRAFT AND BONE GRAFT;  Surgeon: Roseanne Kaufman, MD;  Location: [place];  Service: Orthopedics;  Laterality: Right;  TOTAL HIP ARTHROPLASTY Bilateral      OB History   No obstetric history on file.     Family History  Problem Relation Age of Onset   Sudden death Mother    Stroke Mother    Hypertension Neg Hx     Social History   Tobacco Use   Smoking status: Never   Smokeless tobacco: Never  Vaping Use   Vaping Use: Never used  Substance Use Topics   Alcohol use: Yes    Alcohol/week: 0.0 standard drinks    Comment: 11/09/2015 "might have glass of champaign on special occasion"   Drug use: No    Home Medications Prior to Admission medications   Medication Sig Start Date End Date Taking?  Authorizing Provider  ACCU-CHEK AVIVA PLUS test strip TEST BS QID 12/24/17   [provider]  acetaminophen (TYLENOL) 325 MG tablet Take 650 mg by mouth every 6 (six) hours as needed for fever or mild pain.    [provider]  allopurinol (ZYLOPRIM) 100 MG tablet Take 100 mg by mouth daily. 0800 10/19/18   [provider]  aspirin EC 81 MG tablet Take 81 mg by mouth daily. 0800    [provider]  calcium carbonate (TUMS - DOSED IN MG ELEMENTAL CALCIUM) 500 MG chewable tablet Chew 1 tablet by mouth daily. 0800    [provider]  carvedilol (COREG CR) 80 MG 24 hr capsule TAKE 1 CAPSULE BY MOUTH  DAILY Patient taking differently: No sig reported 06/22/19   Richardson Dopp T, PA-C  cephALEXin (KEFLEX) 500 MG capsule Take 1 capsule (500 mg total) by mouth 3 (three) times daily for 7 days. 03/16/21 03/23/21  Dorie Rank, MD  Cholecalciferol (VITAMIN D) 50 MCG (2000 UT) CAPS Take 2,000 Units by mouth daily. 0800    [provider]  DULoxetine (CYMBALTA) 30 MG capsule Take 30 mg by mouth See admin instructions. Take on Tuesday, Thursday, Saturday, and Sunday. Do not crush. 0800. 09/21/18   [provider]  ezetimibe-simvastatin (VYTORIN) 10-40 MG tablet Take 1 tablet by mouth at bedtime. Patient taking differently: Take 1 tablet by mouth daily. 0800 08/04/19   Richardson Dopp T, PA-C  glucose 4 GM chewable tablet Chew 1 tablet by mouth 3 (three) times daily as needed for low blood sugar. Take if BS is less than 70. Recheck BS 20 mins after given tablet and record.    [provider]  guaiFENesin (ROBITUSSIN) 100 MG/5ML liquid Take 10 mLs by mouth every 4 (four) hours as needed for cough.    [provider]  insulin aspart (NOVOLOG) 100 UNIT/ML FlexPen Inject 20 Units into the skin See admin instructions. Inject SQ before each meal (0715, 1115, 1645). Hold if FSBS is less than 120; notify MD if less 50 or greater than 400.    [provider]  insulin glargine (LANTUS) 100 UNIT/ML Solostar Pen Inject 40 Units into the skin 2 (two) times daily. 0700, 2000.    [provider]  lisinopril (ZESTRIL) 2.5 MG tablet Take 2.5 mg by mouth daily. 0800    [provider]  magnesium oxide (MAG-OX) 400 MG tablet Take 1 tablet (400 mg total) by mouth daily. 02/14/21   Demaio, Alexa, MD  nystatin (MYCOSTATIN/NYSTOP) powder Apply 1 application topically 2 (two) times daily. Under breast until healed. 0800, 2000.    [provider]  OVER THE COUNTER MEDICATION Apply 1 application topically 3 (three) times daily. Apply small amount to  buttocks. 0800, 1500, 2300.    [provider]  oxybutynin (DITROPAN) 5 MG tablet Take 2.5 mg by mouth 2 (two) times daily. 0800, 2000.    [provider]  polyethylene glycol (MIRALAX / GLYCOLAX) 17 g packet Take 17 g by mouth 2 (two) times daily. 02/14/21   Demaio, Alexa, MD  potassium chloride SA (K-DUR,KLOR-CON) 20 MEQ tablet TAKE 1 TABLET(20 MEQ) BY MOUTH DAILY Patient taking differently: Take 20 mEq by mouth daily. Do not crush. 0800. 04/06/17   Richardson Dopp T, PA-C  senna-docusate (SENOKOT-S) 8.6-50 MG tablet Take 1 tablet by mouth at bedtime as needed for mild constipation. Patient taking differently: Take 1 tablet by mouth See admin instructions. Take on Monday and Wednesday. 0800. 12/21/18   Georgette Shell, MD  torsemide (DEMADEX) 20 MG tablet Take 2 tablets (40 mg total) by mouth daily. Please call and schedule an appt for further refills 3rd attempt Patient taking differently: Take 40 mg by mouth daily. 04/22/19   Fay Records, MD    Allergies    Patient has no allergy information on record.  Review of Systems   Review of Systems  All other systems reviewed and are negative.  Physical Exam Updated Vital Signs BP (!) 132/55   Pulse 65   Temp 98.2 F (36.8 C) (Oral)   Resp (!) 22   Ht 5\' 4"  (1.626 m)   Wt 90.7 kg   SpO2 96%   BMI 34.32  kg/m   Physical Exam Vitals and nursing note reviewed.  Constitutional:      General: She is not in acute distress.    Appearance: She is well-developed.  HENT:     Head: Normocephalic and atraumatic.  Eyes:     Conjunctiva/sclera: Conjunctivae normal.     Pupils: Pupils are equal, round, and reactive to light.  Cardiovascular:     Rate and Rhythm: Normal rate and regular rhythm.     Heart sounds: Normal heart sounds.  Pulmonary:     Effort: Pulmonary effort is normal. No respiratory distress.     Breath sounds: Normal breath sounds.  Abdominal:     General: There is no distension.     Palpations: Abdomen is soft.     Tenderness: There is no abdominal tenderness.  Musculoskeletal:        General: No deformity. Normal range of motion.     Cervical back: Normal range of motion and neck supple.  Skin:    General: Skin is warm and dry.  Neurological:     Mental Status: She is alert.     Comments: Alert, mild slurring of speech noted  Oriented to person and place  Left upper extremity with significant weakness  Last known normal was 48 hours prior.    ED Results / Procedures / Treatments   Labs (all labs ordered are listed, but only abnormal results are displayed) Labs Reviewed  CBG MONITORING, ED - Abnormal; Notable for the following components:      Result Value   Glucose-Capillary 105 (*)    All other components within normal limits  RESP PANEL BY RT-PCR (FLU A&B, COVID) [id number]  COMPREHENSIVE METABOLIC PANEL  CBC WITH DIFFERENTIAL/PLATELET  URINALYSIS, ROUTINE W REFLEX MICROSCOPIC    EKG EKG Interpretation  Date/Time:  Tuesday March 19 2021 15:16:51 EST Ventricular Rate:  63 PR Interval:  171 QRS Duration: 163 QT Interval:  497 QTC Calculation: 509 R Axis:   -83 Text Interpretation: Sinus rhythm Probable left atrial  enlargement RBBB and LAFB Left ventricular hypertrophy Confirmed by Dene Gentry [phone]) on 03/19/2021 3:22:03 PM  Radiology DG Chest 2  View  Result Date: 03/17/2021 CLINICAL DATA:  hypoxia EXAM: CHEST - 2 VIEW COMPARISON:  February 13, 2021 FINDINGS: The cardiomediastinal silhouette is unchanged in contour.Atherosclerotic calcifications. Low lung volumes. No pleural effusion. No pneumothorax. No acute pleuroparenchymal abnormality. Visualized abdomen is unremarkable. Multilevel degenerative changes of the thoracic spine. Unchanged mild wedging of the T10 vertebral body. IMPRESSION: No acute cardiopulmonary abnormality. Electronically Signed   By: Valentino Saxon M.D.   On: 03/17/2021 20:45   CT Head Wo Contrast  Result Date: 03/17/2021 CLINICAL DATA:  Fall with trauma to the left side. EXAM: CT HEAD WITHOUT CONTRAST TECHNIQUE: Contiguous axial images were obtained from the base of the skull through the vertex without intravenous contrast. COMPARISON:  03/16/2021 FINDINGS: Brain: Generalized atrophy. Chronic small-vessel ischemic changes of the cerebral hemispheric white matter. Old small vessel infarctions of the thalami and basal ganglia. No sign of acute infarction, mass lesion, hemorrhage, hydrocephalus or extra-axial collection. Vascular: There is atherosclerotic calcification of the major vessels at the base of the brain. Skull: No skull fracture. Sinuses/Orbits: No traumatic fluid in the sinuses. Some mucosal inflammatory changes of the sphenoid sinus. Periorbital and left forehead soft tissue swelling. Other: None IMPRESSION: No acute or traumatic intracranial finding. Atrophy and extensive chronic small-vessel ischemic changes. Soft tissue swelling of the left forehead and periorbital region without evidence of regional fracture. Electronically Signed   By: Nelson Chimes M.D.   On: 03/17/2021 20:30   CT Cervical Spine Wo Contrast  Result Date: 03/17/2021 CLINICAL DATA:  [place] with trauma to the head and neck EXAM: CT CERVICAL SPINE WITHOUT CONTRAST TECHNIQUE: Multidetector CT imaging of the cervical spine was performed without  intravenous contrast. Multiplanar CT image reconstructions were also generated. COMPARISON:  None. FINDINGS: Alignment: No traumatic malalignment. Straightening of the normal cervical lordosis. 3 mm chronic appearing degenerative anterolisthesis at C3-4. Skull base and vertebrae: No evidence of fracture or focal bone lesion. Chronic fusion of the posterior elements at C2-3, from C4 through C6 and C7-T1. Soft tissues and spinal canal: No soft tissue lesion. Disc levels: Chronic degenerative changes at the C1-2 articulation. Chronic posterior element fusion at C2-3. Chronic facet arthropathy at C3-4 with 3 mm of anterolisthesis. Chronic degenerative spondylosis at C4-5, C5-6 and C6-7. Moderate canal narrowing at C5-6 due to bony encroachment. Upper chest: Negative Other: None IMPRESSION: No acute or traumatic finding. Chronic posterior element fusion at C2-3, from C4 through C6, and at C7-T1. Electronically Signed   By: Nelson Chimes M.D.   On: 03/17/2021 20:28   CT Maxillofacial WO CM  Result Date: 03/17/2021 CLINICAL DATA:  Fall with trauma to the left side of the face and head. EXAM: CT MAXILLOFACIAL WITHOUT CONTRAST TECHNIQUE: Multidetector CT imaging of the maxillofacial structures was performed. Multiplanar CT image reconstructions were also generated. COMPARISON:  None. FINDINGS: Osseous: No facial fracture. Orbits: No evidence of intraorbital injury. Periorbital soft tissue swelling on the left. Sinuses: Clear except for small amount of mucosal thickening along the left maxillary sinus floor. Soft tissues: Mild soft tissue swelling of the left side of the face and periorbital region. Limited intracranial: Atrophy and chronic small-vessel ischemic changes. IMPRESSION: No facial fracture. Left facial, forehead and superficial periorbital soft tissue swelling. Electronically Signed   By: Nelson Chimes M.D.   On: 03/17/2021 20:26    Procedures Procedures   Medications Ordered in ED Medications -  No data to  display  ED Course  I have reviewed the triage vital signs and the nursing notes.  Pertinent labs & imaging results that were available during my care of the patient were reviewed by me and considered in my medical decision making (see chart for details).    MDM Rules/Calculators/A&P                          CRITICAL CARE Performed by: Valarie Merino   Total critical care time: 30 minutes  Critical care time was exclusive of separately billable procedures and treating other patients.  Critical care was necessary to treat or prevent imminent or life-threatening deterioration.  Critical care was time spent personally by me on the following activities: development of treatment plan with patient and/or surrogate as well as nursing, discussions with consultants, evaluation of patient's response to treatment, examination of patient, obtaining history from patient or surrogate, ordering and performing treatments and interventions, ordering and review of laboratory studies, ordering and review of radiographic studies, pulse oximetry and re-evaluation of patient's condition.    MDM  MSE complete  ALVETTA HIDROGO was evaluated in Emergency Department on 03/19/2021 for the symptoms described in the history of present illness. She was evaluated in the context of the global COVID-19 pandemic, which necessitated consideration that the patient might be at risk for infection with the SARS-CoV-2 virus that causes COVID-19. Institutional protocols and algorithms that pertain to the evaluation of patients at risk for COVID-19 are in a state of rapid change based on information released by regulatory bodies including the CDC and federal and state organizations. These policies and algorithms were followed during the patient's care in the ED.  Patient is presenting from her facility for worsening slurred speech.  Patient with noted left upper extremity weakness.  Initial CT imaging is suggestive of evolving  acute infarct.  Patient is outside window for aggressive neuro intervention.  Neurology service is aware of case and will evaluate.  Patient's daughter made aware of need for admission.  Hospitalist service made aware of case and will evaluate for admission.    Final Clinical Impression(s) / ED Diagnoses Final diagnoses:  Slurred speech    Rx / DC Orders ED Discharge Orders     None        Valarie Merino, MD 03/19/21 1616

## 2021-03-19 NOTE — Consult Note (Signed)
Stroke Neurology Consultation Note  Consult Requested by: Dr. Evette Doffing  Reason for Consult: stroke  Consult Date: 03/19/21   The history was obtained from the chart. Pt disorientated and sleepy, not able to provide adequate information.  History of Present Illness:  Brandi Melton is a 87 y.o. Caucasian female with PMH of hypertension, diabetes, CKD, dementia, Bell's palsy, breast cancer admitted for stroke.  Patient lives in the nursing facility.  On 03/16/2021, she had an ED visit for increasing following at nursing facility, whole body weakness and difficulty walking.  CT no acute abnormality.  Found to have UTI with WBC 11-20 in UA, put on Keflex on discharge.  Second day 03/17/2021, patient again had an ED visit after a fall in the nursing facility hitting her left side of head with left scalp with dried blood and left orbital ecchymosis on presentation.  CT no acute abnormality.  Patient will discharge back to nursing facility with an intact neurological examination.  However, since then, patient was found to have slurred speech and left-sided weakness, left facial droop.  She was sent back to ED today for evaluation.  CT head repeat showed right parietal small to moderate sized infarct.  She was also found to have AKI on CKD with creatinine up to 2.28 from 1.692 days ago.  WBC 11.5 leukocytosis.  Neurology was consulted for new stroke.  LSN: 2 days ago tPA Given: No: Outside window  Past Medical History:  Diagnosis Date   Arthritis    "hips before they were replaced, right knee now" (11/09/2015)   Chronic diastolic (congestive) heart failure ([place])    a. echo 06/2015: EF 65-70%, Grade 1 DD, mitral valve calcification.   CKD (chronic kidney disease), stage III (HCC)    Depression    Dyspnea    H/O cardiac catheterization    a. h/o cardiac catheterization 05/2013 in [place] showing only minimal CAD with increased filling pressure.   Hyperlipemia    Hypertensive heart disease    Morbid  obesity ([place])    Palsy, Bell's    RA (rheumatoid arthritis) ([place])    Sleep apnea    "suppose to wear mask; couldn't" (11/09/2015)   Type II diabetes mellitus [facility])     Past Surgical History:  Procedure Laterality Date   APPENDECTOMY  1966   CARDIAC CATHETERIZATION  05/2013   CARPAL TUNNEL RELEASE Right 08/08/2014   Procedure: OPEN CARPAL TUNNEL RELEASE;  Surgeon: Roseanne Kaufman, MD;  Location: [place];  Service: Orthopedics;  Laterality: Right;   CATARACT EXTRACTION W/ INTRAOCULAR LENS  IMPLANT, BILATERAL Bilateral    CHOLECYSTECTOMY OPEN  1966   COLONOSCOPY     FRACTURE SURGERY     JOINT REPLACEMENT     KNEE ARTHROPLASTY Left    ORIF WRIST FRACTURE Right 08/08/2014   Procedure: Right OPEN REDUCTION INTERNAL FIXATION (ORIF) WRIST FRACTURE WITH REPAIR AND RECONST/ALLOGRAFT AND BONE GRAFT;  Surgeon: Roseanne Kaufman, MD;  Location: [place];  Service: Orthopedics;  Laterality: Right;   TOTAL HIP ARTHROPLASTY Bilateral     Family History  Problem Relation Age of Onset   Sudden death Mother    Stroke Mother    Hypertension Neg Hx     Social History:  reports that she has never smoked. She has never used smokeless tobacco. She reports current alcohol use. She reports that she does not use drugs.  Allergies: Not on File  No current facility-administered medications on file prior to encounter.   Current Outpatient Medications on File  Prior to Encounter  Medication Sig Dispense Refill   ACCU-CHEK AVIVA PLUS test strip TEST BS QID  1   acetaminophen (TYLENOL) 325 MG tablet Take 650 mg by mouth every 6 (six) hours as needed for fever or mild pain.     allopurinol (ZYLOPRIM) 100 MG tablet Take 100 mg by mouth daily. 0800     aspirin EC 81 MG tablet Take 81 mg by mouth daily. 0800     calcium carbonate (TUMS - DOSED IN MG ELEMENTAL CALCIUM) 500 MG chewable tablet Chew 1 tablet by mouth daily. 0800     carvedilol (COREG CR) 80 MG 24 hr capsule TAKE 1 CAPSULE BY MOUTH  DAILY (Patient taking  differently: No sig reported) 90 capsule 3   cephALEXin (KEFLEX) 500 MG capsule Take 1 capsule (500 mg total) by mouth 3 (three) times daily for 7 days. 21 capsule 0   Cholecalciferol (VITAMIN D) 50 MCG (2000 UT) CAPS Take 2,000 Units by mouth daily. 0800     DULoxetine (CYMBALTA) 30 MG capsule Take 30 mg by mouth See admin instructions. Take on Tuesday, Thursday, Saturday, and Sunday. Do not crush. 0800.     ezetimibe-simvastatin (VYTORIN) 10-40 MG tablet Take 1 tablet by mouth at bedtime. (Patient taking differently: Take 1 tablet by mouth daily. 0800) 90 tablet 0   glucose 4 GM chewable tablet Chew 1 tablet by mouth 3 (three) times daily as needed for low blood sugar. Take if BS is less than 70. Recheck BS 20 mins after given tablet and record.     guaiFENesin (ROBITUSSIN) 100 MG/5ML liquid Take 10 mLs by mouth every 4 (four) hours as needed for cough.     insulin aspart (NOVOLOG) 100 UNIT/ML FlexPen Inject 20 Units into the skin See admin instructions. Inject SQ before each meal (0715, 1115, 1645). Hold if FSBS is less than 120; notify MD if less 50 or greater than 400.     insulin glargine (LANTUS) 100 UNIT/ML Solostar Pen Inject 40 Units into the skin 2 (two) times daily. 0700, 2000.     lisinopril (ZESTRIL) 2.5 MG tablet Take 2.5 mg by mouth daily. 0800     magnesium oxide (MAG-OX) 400 MG tablet Take 1 tablet (400 mg total) by mouth daily. 90 tablet 3   nystatin (MYCOSTATIN/NYSTOP) powder Apply 1 application topically 2 (two) times daily. Under breast until healed. 0800, 2000.     OVER THE COUNTER MEDICATION Apply 1 application topically 3 (three) times daily. Apply small amount to buttocks. 0800, 1500, 2300.     oxybutynin (DITROPAN) 5 MG tablet Take 2.5 mg by mouth 2 (two) times daily. 0800, 2000.     polyethylene glycol (MIRALAX / GLYCOLAX) 17 g packet Take 17 g by mouth 2 (two) times daily. 14 each 0   potassium chloride SA (K-DUR,KLOR-CON) 20 MEQ tablet TAKE 1 TABLET(20 MEQ) BY MOUTH DAILY  (Patient taking differently: Take 20 mEq by mouth daily. Do not crush. 0800.) 90 tablet 3   senna-docusate (SENOKOT-S) 8.6-50 MG tablet Take 1 tablet by mouth at bedtime as needed for mild constipation. (Patient taking differently: Take 1 tablet by mouth See admin instructions. Take on Monday and Wednesday. 0800.)     torsemide (DEMADEX) 20 MG tablet Take 2 tablets (40 mg total) by mouth daily. Please call and schedule an appt for further refills 3rd attempt (Patient taking differently: Take 40 mg by mouth daily.) 30 tablet 0    Review of Systems: A full ROS was attempted today  and was not able to be performed given patient disorientation.  Physical Examination: Temp:  [98.2 F (36.8 C)] 98.2 F (36.8 C) (12/06 1513) Pulse Rate:  [63-70] 66 (12/06 2230) Resp:  [15-22] 18 (12/06 2030) BP: (99-142)/(42-62) 107/42 (12/06 2230) SpO2:  [93 %-98 %] 94 % (12/06 2230) Weight:  [90.7 kg] 90.7 kg (12/06 1509)  General - well nourished, well developed, sleepy but arousable with tactile stimulation.    Ophthalmologic - fundi not visualized due to noncooperation.    Cardiovascular - regular rhythm and rate, no A. fib on telemetry  Neuro - initially sleeping, but arousable with tactile stimulation, and able to maintain wakefulness, eyes open, orientated to age, place, but not able to tell the name of hospital, disorientated to year and months. No aphasia, however positive speech, following most simple commands. Able to name 2/3 and repeat, mild to moderate dysarthria. No gaze palsy, left periorbital ecchymosis, tracking bilaterally, visual field full, PERRL.  Left facial droop. Tongue midline. RUE no drift, LUE flaccid, 0/5.  RLE 3/5. LLE 2+/5.  Not cooperative on sensation exam, however patient stated difference of sensation between left and right, however cannot tell which side was normal sensation feeding.  Right FTN intact grossly, gait not tested.    Data Reviewed: DG Chest 2 View  Result Date:  03/17/2021 CLINICAL DATA:  hypoxia EXAM: CHEST - 2 VIEW COMPARISON:  February 13, 2021 FINDINGS: The cardiomediastinal silhouette is unchanged in contour.Atherosclerotic calcifications. Low lung volumes. No pleural effusion. No pneumothorax. No acute pleuroparenchymal abnormality. Visualized abdomen is unremarkable. Multilevel degenerative changes of the thoracic spine. Unchanged mild wedging of the T10 vertebral body. IMPRESSION: No acute cardiopulmonary abnormality. Electronically Signed   By: Valentino Saxon M.D.   On: 03/17/2021 20:45   CT Head Wo Contrast  Result Date: 03/19/2021 CLINICAL DATA:  Trauma, left-sided weakness.  Recent fall. EXAM: CT HEAD WITHOUT CONTRAST CT CERVICAL SPINE WITHOUT CONTRAST TECHNIQUE: Multidetector CT imaging of the head and cervical spine was performed following the standard protocol without intravenous contrast. Multiplanar CT image reconstructions of the cervical spine were also generated. COMPARISON:  CT head and cervical spine 03/17/2021. FINDINGS: CT HEAD FINDINGS Brain: There is a small area of deep white matter and cortical hypodensity in the right parietal lobe image 3/18 which is new from prior worrisome for acute infarct. There is mild sulcal effacement without significant mass effect or midline shift. There is no acute intracranial hemorrhage or extra-axial fluid collection. There is no hydrocephalus. There is mild diffuse atrophy, unchanged. There is stable mild periventricular white matter hypodensity, likely chronic small vessel ischemic change. Vascular: No hyperdense vessel or unexpected calcification. Skull: Normal. Negative for fracture or focal lesion. Sinuses/Orbits: There is mucosal thickening of the right sphenoid sinus. Mastoid air cells are clear. Other: Left frontal scalp soft tissue swelling is again noted, decreased from prior. CT CERVICAL SPINE FINDINGS Alignment: There is stable 3 mm of anterolisthesis at C3-C4 which is likely degenerative.  Alignment is otherwise anatomic. Skull base and vertebrae: No acute fracture. No primary bone lesion or focal pathologic process. Soft tissues and spinal canal: No prevertebral fluid or swelling. No visible canal hematoma. Disc levels: There is significant disc space narrowing and endplate osteophyte formation throughout the mid and lower cervical spine which is similar to the prior examination. There is also significant bilateral facet joint degenerative change. Multilevel neural foraminal stenosis on the left appears similar to the prior examination. Mild central canal stenosis at C5-C6 is also similar to  the prior examination. Upper chest: Negative. Other: None. IMPRESSION: 1. Small acute infarct in the right parietal lobe. No midline shift. No acute intracranial hemorrhage. 2. No acute fracture or traumatic subluxation of the cervical spine. Electronically Signed   By: Ronney Asters M.D.   On: 03/19/2021 17:47   CT Head Wo Contrast  Result Date: 03/17/2021 CLINICAL DATA:  Fall with trauma to the left side. EXAM: CT HEAD WITHOUT CONTRAST TECHNIQUE: Contiguous axial images were obtained from the base of the skull through the vertex without intravenous contrast. COMPARISON:  03/16/2021 FINDINGS: Brain: Generalized atrophy. Chronic small-vessel ischemic changes of the cerebral hemispheric white matter. Old small vessel infarctions of the thalami and basal ganglia. No sign of acute infarction, mass lesion, hemorrhage, hydrocephalus or extra-axial collection. Vascular: There is atherosclerotic calcification of the major vessels at the base of the brain. Skull: No skull fracture. Sinuses/Orbits: No traumatic fluid in the sinuses. Some mucosal inflammatory changes of the sphenoid sinus. Periorbital and left forehead soft tissue swelling. Other: None IMPRESSION: No acute or traumatic intracranial finding. Atrophy and extensive chronic small-vessel ischemic changes. Soft tissue swelling of the left forehead and  periorbital region without evidence of regional fracture. Electronically Signed   By: Nelson Chimes M.D.   On: 03/17/2021 20:30   CT HEAD WO CONTRAST  Result Date: 03/16/2021 CLINICAL DATA:  Neuro deficit, stroke suspected, multiple falls EXAM: CT HEAD WITHOUT CONTRAST TECHNIQUE: Contiguous axial images were obtained from the base of the skull through the vertex without intravenous contrast. COMPARISON:  02/13/2021 FINDINGS: Brain: No evidence of acute infarction, hemorrhage, hydrocephalus, extra-axial collection or mass lesion/mass effect. Periventricular and deep white matter hypodensity. Vascular: No hyperdense vessel or unexpected calcification. Skull: Normal. Negative for fracture or focal lesion. Sinuses/Orbits: No acute finding. Other: None. IMPRESSION: No acute intracranial pathology. Small-vessel white matter disease. Electronically Signed   By: Delanna Ahmadi M.D.   On: 03/16/2021 13:28   CT Cervical Spine Wo Contrast  Result Date: 03/19/2021 CLINICAL DATA:  Trauma, left-sided weakness.  Recent fall. EXAM: CT HEAD WITHOUT CONTRAST CT CERVICAL SPINE WITHOUT CONTRAST TECHNIQUE: Multidetector CT imaging of the head and cervical spine was performed following the standard protocol without intravenous contrast. Multiplanar CT image reconstructions of the cervical spine were also generated. COMPARISON:  CT head and cervical spine 03/17/2021. FINDINGS: CT HEAD FINDINGS Brain: There is a small area of deep white matter and cortical hypodensity in the right parietal lobe image 3/18 which is new from prior worrisome for acute infarct. There is mild sulcal effacement without significant mass effect or midline shift. There is no acute intracranial hemorrhage or extra-axial fluid collection. There is no hydrocephalus. There is mild diffuse atrophy, unchanged. There is stable mild periventricular white matter hypodensity, likely chronic small vessel ischemic change. Vascular: No hyperdense vessel or unexpected  calcification. Skull: Normal. Negative for fracture or focal lesion. Sinuses/Orbits: There is mucosal thickening of the right sphenoid sinus. Mastoid air cells are clear. Other: Left frontal scalp soft tissue swelling is again noted, decreased from prior. CT CERVICAL SPINE FINDINGS Alignment: There is stable 3 mm of anterolisthesis at C3-C4 which is likely degenerative. Alignment is otherwise anatomic. Skull base and vertebrae: No acute fracture. No primary bone lesion or focal pathologic process. Soft tissues and spinal canal: No prevertebral fluid or swelling. No visible canal hematoma. Disc levels: There is significant disc space narrowing and endplate osteophyte formation throughout the mid and lower cervical spine which is similar to the prior examination. There is also  significant bilateral facet joint degenerative change. Multilevel neural foraminal stenosis on the left appears similar to the prior examination. Mild central canal stenosis at C5-C6 is also similar to the prior examination. Upper chest: Negative. Other: None. IMPRESSION: 1. Small acute infarct in the right parietal lobe. No midline shift. No acute intracranial hemorrhage. 2. No acute fracture or traumatic subluxation of the cervical spine. Electronically Signed   By: Ronney Asters M.D.   On: 03/19/2021 17:47   CT Cervical Spine Wo Contrast  Result Date: 03/17/2021 CLINICAL DATA:  [place] with trauma to the head and neck EXAM: CT CERVICAL SPINE WITHOUT CONTRAST TECHNIQUE: Multidetector CT imaging of the cervical spine was performed without intravenous contrast. Multiplanar CT image reconstructions were also generated. COMPARISON:  None. FINDINGS: Alignment: No traumatic malalignment. Straightening of the normal cervical lordosis. 3 mm chronic appearing degenerative anterolisthesis at C3-4. Skull base and vertebrae: No evidence of fracture or focal bone lesion. Chronic fusion of the posterior elements at C2-3, from C4 through C6 and C7-T1. Soft  tissues and spinal canal: No soft tissue lesion. Disc levels: Chronic degenerative changes at the C1-2 articulation. Chronic posterior element fusion at C2-3. Chronic facet arthropathy at C3-4 with 3 mm of anterolisthesis. Chronic degenerative spondylosis at C4-5, C5-6 and C6-7. Moderate canal narrowing at C5-6 due to bony encroachment. Upper chest: Negative Other: None IMPRESSION: No acute or traumatic finding. Chronic posterior element fusion at C2-3, from C4 through C6, and at C7-T1. Electronically Signed   By: Nelson Chimes M.D.   On: 03/17/2021 20:28   MR ANGIO HEAD WO CONTRAST  Result Date: 03/19/2021 CLINICAL DATA:  Stroke follow-up EXAM: MRI HEAD WITHOUT CONTRAST MRA HEAD WITHOUT CONTRAST TECHNIQUE: Multiplanar, multi-echo pulse sequences of the brain and surrounding structures were acquired without intravenous contrast. Angiographic images of the Circle of Willis were acquired using MRA technique without intravenous contrast. COMPARISON:  10/04/2020 brain MRI Head CT 03/19/2021 FINDINGS: MRI HEAD FINDINGS Brain: Medium-sized area of abnormal diffusion restriction within the right frontal operculum, anterior right parietal lobe and posterior right insula. Small area of diffusion restriction in the left temporal lobe. There is a thin left hemispheric subdural hematoma measuring 3 mm. There is multifocal hyperintense T2-weighted signal within the white matter. Generalized volume loss without a clear lobar predilection. Normal midline structures. Vascular: Major flow voids are preserved. Skull and upper cervical spine: Normal calvarium and skull base. Visualized upper cervical spine and soft tissues are normal. Sinuses/Orbits:No paranasal sinus fluid levels or advanced mucosal thickening. No mastoid or middle ear effusion. Normal orbits. MRA HEAD FINDINGS POSTERIOR CIRCULATION: --Vertebral arteries: Normal --Inferior cerebellar arteries: Normal. --Basilar artery: Normal. --Superior cerebellar arteries:  Normal. --Posterior cerebral arteries: Normal. ANTERIOR CIRCULATION: --Intracranial internal carotid arteries: Normal. --Anterior cerebral arteries (ACA): Normal. --Middle cerebral arteries (MCA): Normal. ANATOMIC VARIANTS: None IMPRESSION: 1. Medium-sized area of acute ischemia within the right frontal operculum, anterior right parietal lobe and posterior right insula. 2. Small acute infarct of the left temporal lobe. 3. Normal intracranial MRA. 4. Thin left hemispheric subdural hematoma measuring 3 mm. These results were communicated to [place] at 9:46 pm on 03/19/2021 by text page via the [facility] messaging system. Electronically Signed   By: Ulyses Jarred M.D.   On: 03/19/2021 21:46   MR BRAIN WO CONTRAST  Result Date: 03/19/2021 CLINICAL DATA:  Stroke follow-up EXAM: MRI HEAD WITHOUT CONTRAST MRA HEAD WITHOUT CONTRAST TECHNIQUE: Multiplanar, multi-echo pulse sequences of the brain and surrounding structures were acquired without intravenous contrast. Angiographic images of  the Circle of Willis were acquired using MRA technique without intravenous contrast. COMPARISON:  10/04/2020 brain MRI Head CT 03/19/2021 FINDINGS: MRI HEAD FINDINGS Brain: Medium-sized area of abnormal diffusion restriction within the right frontal operculum, anterior right parietal lobe and posterior right insula. Small area of diffusion restriction in the left temporal lobe. There is a thin left hemispheric subdural hematoma measuring 3 mm. There is multifocal hyperintense T2-weighted signal within the white matter. Generalized volume loss without a clear lobar predilection. Normal midline structures. Vascular: Major flow voids are preserved. Skull and upper cervical spine: Normal calvarium and skull base. Visualized upper cervical spine and soft tissues are normal. Sinuses/Orbits:No paranasal sinus fluid levels or advanced mucosal thickening. No mastoid or middle ear effusion. Normal orbits. MRA HEAD FINDINGS POSTERIOR CIRCULATION:  --Vertebral arteries: Normal --Inferior cerebellar arteries: Normal. --Basilar artery: Normal. --Superior cerebellar arteries: Normal. --Posterior cerebral arteries: Normal. ANTERIOR CIRCULATION: --Intracranial internal carotid arteries: Normal. --Anterior cerebral arteries (ACA): Normal. --Middle cerebral arteries (MCA): Normal. ANATOMIC VARIANTS: None IMPRESSION: 1. Medium-sized area of acute ischemia within the right frontal operculum, anterior right parietal lobe and posterior right insula. 2. Small acute infarct of the left temporal lobe. 3. Normal intracranial MRA. 4. Thin left hemispheric subdural hematoma measuring 3 mm. These results were communicated to [place] at 9:46 pm on 03/19/2021 by text page via the [facility] messaging system. Electronically Signed   By: Ulyses Jarred M.D.   On: 03/19/2021 21:46   CT Maxillofacial WO CM  Result Date: 03/17/2021 CLINICAL DATA:  Fall with trauma to the left side of the face and head. EXAM: CT MAXILLOFACIAL WITHOUT CONTRAST TECHNIQUE: Multidetector CT imaging of the maxillofacial structures was performed. Multiplanar CT image reconstructions were also generated. COMPARISON:  None. FINDINGS: Osseous: No facial fracture. Orbits: No evidence of intraorbital injury. Periorbital soft tissue swelling on the left. Sinuses: Clear except for small amount of mucosal thickening along the left maxillary sinus floor. Soft tissues: Mild soft tissue swelling of the left side of the face and periorbital region. Limited intracranial: Atrophy and chronic small-vessel ischemic changes. IMPRESSION: No facial fracture. Left facial, forehead and superficial periorbital soft tissue swelling. Electronically Signed   By: Nelson Chimes M.D.   On: 03/17/2021 20:26    Assessment: 87 y.o. female with PMH of hypertension, diabetes, CKD, dementia, Bell's palsy, breast cancer admitted for stroke.  She had a recent ED visit for frequent falls, however does not like the weakness and slurred speech  for the last 2 days.  CT head repeat today showed right parietal small to moderate sized infarct. MRI confirmed right parietal, frontal operculum, right insula infarcts as well as small left temporal lobe infarct, but also left hemispheric seen layer SDH, likely traumatic from head trauma 2 days ago.  MRA head negative.  She was also found to have AKI on CKD and leukocytosis.  Recommend further stroke work-up with 2D echo and carotid Doppler, LDL and A1c.  Further cardioembolic work-up per stroke team.   Plan: Continue further stroke work up  Frequent neuro checks Telemetry monitoring carotid doppler Echocardiogram  fasting lipid panel and HgbA1C PT/OT/speech consult GI and DVT prophylaxis  Continue aspirin 325 for now as well as statin.  Avoid DAPT for now given SDH. Further cardioembolic work-up per stroke team Management of UTI and AKI on CKD per primary team Stroke team will follow in a.m.   Thank you for this consultation and allowing [place] to participate in the care of this patient.  Rosalin Hawking, MD  PhD Stroke Neurology 03/19/2021 11:33 PM

## 2021-03-19 NOTE — H&P (Addendum)
Date: 03/20/2021               Patient Name:  Brandi Melton MRN: [id number]  DOB: 1930/05/08 Age / Sex: 87 y.o., female   PCP: Pcp, No         Medical Service: Internal Medicine Teaching Service         Attending Physician: Dr. Evette Doffing, Mallie Mussel, *    First Contact: Dr. Ileene Musa Pager: [phone]  Second Contact: Dr. Lisabeth Devoid  Pager: [phone]       After Hours (After 5p/  First Contact Pager: [phone]  weekends / holidays): Second Contact Pager: [phone]   Chief Complaint: Code stroke  History of Present Illness: Brandi Melton is an 83 yoF with a PMHx of type II DM, hyperlipidemia, hypertension, CKD stage III, gout and diastolic heart failure presenting with aphasia. She was sent from her facility due to progressive slurred speech and left-sided weakness.  Of note she presented to [facility] emergency department on 12/3 and 12/4 due to mechanical falls.  CT head at that time was unremarkable and patient was discharged to her nursing facility.  CT head was repeated on 12/4 due to another sustained mechanical fall with notable trauma to her left forehead and orbit.  CT head showed soft tissue swelling of the left forehead and periorbital region without evidence of fracture or intracranial abnormality.  Today she was sent in due to progressive changes in her speech for the past 2 days.    Patient states she does not feel that her speech is slurred or she is any different from her baseline. She feels frustrated that everyone has pointed out that her speech is slurred and that she had a stroke. Denies any weakness. Feels comfortable. She has not been walking much but is moving her legs okay. Denies headaches, lightheadedness, dizziness, changes in her vision, sore throat, cough, chest pain, shortness of breath, difficulty breathing, abdominal pain, nausea or vomiting. Felt constipated 2-3 days ago. She has been urinating okay.   Meds:  Aspirin 81 mg Allopurinol 100 mg Duloxetine 30 mg   Oxybutynin 2.5 mg BID Carvedilol 80 mg  Calcium carbonate 500 mg  Cholecalcierol 50 mcg Ezetimibe-simvastatin 10-40 mg  Insulin aspart 20 units TID WC Insulin glargine 40 units BID  Lisinopril 2.5 mg  Magnesium oxide 400 mg  Nystatin powder prn Torsemide 40 mg  Miralax prn Senokot-S prn  No outpatient medications have been marked as taking for the 03/19/21 encounter [facility] Encounter).     Allergies: Allergies as of 03/19/2021   (Not on File)   Past Medical History:  Diagnosis Date   Arthritis    "hips before they were replaced, right knee now" (11/09/2015)   Chronic diastolic (congestive) heart failure ([place])    a. echo 06/2015: EF 65-70%, Grade 1 DD, mitral valve calcification.   CKD (chronic kidney disease), stage III (HCC)    Depression    Dyspnea    H/O cardiac catheterization    a. h/o cardiac catheterization 05/2013 in [place] showing only minimal CAD with increased filling pressure.   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 Currently lives at [facility]. Denies any tobacco, alcohol or illicit drug use. Ambulates with a cane at baseline.  Review of Systems: A complete ROS was negative except as per HPI.   Physical Exam: Blood pressure (!) 119/44, pulse 68, temperature 98.2 F (36.8 C), temperature source Oral, resp. rate 18, height 5\' 4"  (1.626 m), weight 90.7 kg, SpO2 98 %. Physical Exam Constitutional:      General: She is awake.  HENT:     Head: Normocephalic. Abrasion, contusion and left periorbital erythema present.   Cardiovascular:     Rate and Rhythm: Normal rate.     Pulses:          Radial pulses are 2+ on the right side and 2+ on the left side.  Pulmonary:     Effort: Pulmonary effort is normal.     Breath sounds: Normal breath sounds.  Abdominal:     General: Abdomen is protuberant. Bowel sounds are normal.     Palpations: Abdomen is soft.     Hernia: A hernia is present. Hernia is present in the ventral area.     Comments: Non reducible ventral hernia noted above the umbilicus   Genitourinary:    Comments: External catheter present Musculoskeletal:     Right lower leg: No edema.     Left lower leg: No edema.     Comments: Compression socks on bilateral lower extremities   Skin:    General: Skin is warm and dry.  Neurological:     Mental Status: She is alert and oriented to person, place, and time.     Cranial Nerves: Dysarthria and facial asymmetry present.     Motor: Weakness present.     Comments: No motor function of the left arm; sensation intact 3/5 strength of the left lower extremity with decreased sensation 4/5 strength of right upper/lower extremity, sensation intact  Psychiatric:        Mood and Affect: Mood normal.        Speech: Speech is slurred.        Behavior: Behavior normal. Behavior is cooperative.        Cognition and Memory: Cognition normal.      EKG: personally reviewed my interpretation is sinus rhythm, no ischemic changes  CT head without contrast IMPRESSION: 1. Small acute infarct in the right parietal lobe. No midline shift. No acute intracranial hemorrhage. 2. No acute fracture or traumatic subluxation of the cervical spine.  MRI brain without contrast IMPRESSION: 1. Medium-sized area of acute ischemia within the right frontal operculum, anterior right parietal lobe and posterior right insula. 2. Small acute infarct of the left temporal lobe. 3. Normal intracranial MRA. 4. Thin left hemispheric subdural hematoma measuring 3 mm.  Assessment &  Plan by Problem: Principal Problem:   Stroke [facility]) Active Problems:   Diabetes mellitus type 2, insulin dependent ([place])   Hypertension   Hyperlipemia   CKD (chronic kidney disease), stage III (HCC)   AKI (acute kidney injury) ([place])   Subdural hematoma  Ms. Koralynn Greenspan is an 71 yoF with a PMHx of type II DM, hyperlipidemia, hypertension, CKD stage III, gout and diastolic heart failure presenting with aphasia and code stroke.  Acute right parietal lobe infarct Patient presented from her facility due to progressive slurring of her  speech.  Code stroke was called upon arrival and CT head showed small acute infarct in the right parietal lobe with no midline shift or acute intracranial hemorrhage.  MRI brain showed acute ischemia within right frontal operculum, anterior right parietal lobe and posterior right insula.  Also a small acute infarct of the left temporal lobe.  Patient's last known normal was on 12/4, patient outside of window, thus no aggressive neurological intervention recommended.  On exam there is notable slurred speech and left upper extremity weakness, 0/5.  Left lower extremity weakness 3/5 and with decreased sensation. Patient passed bedside swallow. - Neurology following; appreciate their recommendations - Loaded with aspirin - Started on atorvastatin 80 mg - Telemetry monitoring - TTE pending - Carotid vascular ultrasound ordered - Risk management of HTN, HLD and T2DM  - Follow-up hemoglobin A1c and lipid panel - PT/OT/SLP evaluation  Subdural Hematoma Patient recently sustained multiple mechanical falls (12/3 and 12/4); patient was seen in the ED for both occurences. On the mechanical fall sustained on 12/4, trauma noted of the left parietal/temporal region. CT of the head obtained (12/4) revealed no acute intracranial findings, soft tissue swelling of the left forehead and periorbital region without evidence of regional fractures. On exam bruising noted of the left eye  and temporal region. Patient denied headache, changes in vision or pain from the contusion. Repeat imaging on admission showed thin left hemispheric subdural hematoma measuring 3 mm on MRI of the brain. Mentation intact, patient A&Ox3 and appeared to be at baseline. --Due to small size, likely to improve --Frequent neuro checks --Avoid DAPT in the setting of SDH --Consider repeat CT if mentation declines  Type 2 DM Most recent hemoglobin A1c 11/2 was 7.5.  Recheck today and is 7.2.  Patient is on insulin aspart 20 mg 3 times daily with meals and insulin glargine 40 units twice daily.  Blood glucose in the low 100s since admission. -CBG monitoring -SSI  Hyperlipidemia Previously on ezetimibe--atorvastatin 10-40 mg.  Unable to see most recent lipid panel. -Follow-up lipid -Start high intensity statin  Hypertension Diastolic heart failure Medications include lisinopril, Coreg, and torsemide. Holding in the setting of permissive hypertension. -Allow for permissive hypertension for 24-48 hours -Strict ins and outs -Daily weights  AKI on CKD stage IIIb Creatinine 2.28, BUN 62.  Baseline appears to be approximately 1.4-1.5.  Likely prerenal. -Gentle hydration -Avoid nephrotoxic agents  Anemia of CKD Hemoglobin stable at 12.1, improved from 3 days ago at 10. -Trend CBC  Stage II sacral pressure injury -Frequent repositioning and barrier cream  Gout -Continue allopurinol 100 mg daily  History of stage II melanoma Status post excision in July of this year.  Margins were clear on pathology review.  RLL lung nodule CT chest during last admission showed stable right lower lobe pulmonary nodules and recommended follow-up noncontrast chest CT in about 6 months.  Largest nodule measuring up to 8 mm.  Anxiety/depression, chronic and stable -Continue Cymbalta  Diet: Carb modified VTE prophylaxis: Heparin DNR  Dispo: Admit patient to Inpatient with expected length of stay greater than  2 midnights.  Signed: Timothy Lasso, MD 03/20/2021, 7:42 AM  Pager: [phone] After 5pm on weekdays and 1pm on weekends: On Call pager: [phone]

## 2021-03-19 NOTE — ED Notes (Signed)
RN unable to obtain labs or IV access. Pt transported to CT

## 2021-03-20 ENCOUNTER — Telehealth: Payer: Self-pay

## 2021-03-20 ENCOUNTER — Inpatient Hospital Stay (HOSPITAL_COMMUNITY): Payer: Medicare HMO

## 2021-03-20 ENCOUNTER — Observation Stay (HOSPITAL_COMMUNITY): Payer: Medicare HMO

## 2021-03-20 DIAGNOSIS — I6389 Other cerebral infarction: Secondary | ICD-10-CM | POA: Diagnosis not present

## 2021-03-20 DIAGNOSIS — F0393 Unspecified dementia, unspecified severity, with mood disturbance: Secondary | ICD-10-CM | POA: Diagnosis present

## 2021-03-20 DIAGNOSIS — I639 Cerebral infarction, unspecified: Secondary | ICD-10-CM

## 2021-03-20 DIAGNOSIS — N184 Chronic kidney disease, stage 4 (severe): Secondary | ICD-10-CM | POA: Diagnosis present

## 2021-03-20 DIAGNOSIS — Z23 Encounter for immunization: Secondary | ICD-10-CM | POA: Diagnosis present

## 2021-03-20 DIAGNOSIS — I6349 Cerebral infarction due to embolism of other cerebral artery: Secondary | ICD-10-CM | POA: Diagnosis present

## 2021-03-20 DIAGNOSIS — I130 Hypertensive heart and chronic kidney disease with heart failure and stage 1 through stage 4 chronic kidney disease, or unspecified chronic kidney disease: Secondary | ICD-10-CM | POA: Diagnosis present

## 2021-03-20 DIAGNOSIS — R4781 Slurred speech: Secondary | ICD-10-CM | POA: Diagnosis present

## 2021-03-20 DIAGNOSIS — R29707 NIHSS score 7: Secondary | ICD-10-CM | POA: Diagnosis present

## 2021-03-20 DIAGNOSIS — E1122 Type 2 diabetes mellitus with diabetic chronic kidney disease: Secondary | ICD-10-CM | POA: Diagnosis present

## 2021-03-20 DIAGNOSIS — D631 Anemia in chronic kidney disease: Secondary | ICD-10-CM | POA: Diagnosis present

## 2021-03-20 DIAGNOSIS — E785 Hyperlipidemia, unspecified: Secondary | ICD-10-CM | POA: Diagnosis present

## 2021-03-20 DIAGNOSIS — I5032 Chronic diastolic (congestive) heart failure: Secondary | ICD-10-CM | POA: Diagnosis present

## 2021-03-20 DIAGNOSIS — Z20822 Contact with and (suspected) exposure to covid-19: Secondary | ICD-10-CM | POA: Diagnosis present

## 2021-03-20 DIAGNOSIS — F05 Delirium due to known physiological condition: Secondary | ICD-10-CM | POA: Diagnosis not present

## 2021-03-20 DIAGNOSIS — L89152 Pressure ulcer of sacral region, stage 2: Secondary | ICD-10-CM | POA: Diagnosis present

## 2021-03-20 DIAGNOSIS — N179 Acute kidney failure, unspecified: Secondary | ICD-10-CM | POA: Diagnosis present

## 2021-03-20 DIAGNOSIS — N390 Urinary tract infection, site not specified: Secondary | ICD-10-CM | POA: Diagnosis present

## 2021-03-20 DIAGNOSIS — S065XAA Traumatic subdural hemorrhage with loss of consciousness status unknown, initial encounter: Secondary | ICD-10-CM | POA: Diagnosis present

## 2021-03-20 DIAGNOSIS — E669 Obesity, unspecified: Secondary | ICD-10-CM | POA: Diagnosis present

## 2021-03-20 DIAGNOSIS — W010XXA Fall on same level from slipping, tripping and stumbling without subsequent striking against object, initial encounter: Secondary | ICD-10-CM | POA: Diagnosis present

## 2021-03-20 DIAGNOSIS — Y92129 Unspecified place in nursing home as the place of occurrence of the external cause: Secondary | ICD-10-CM | POA: Diagnosis not present

## 2021-03-20 DIAGNOSIS — R29810 Facial weakness: Secondary | ICD-10-CM | POA: Diagnosis present

## 2021-03-20 DIAGNOSIS — Z66 Do not resuscitate: Secondary | ICD-10-CM | POA: Diagnosis present

## 2021-03-20 DIAGNOSIS — F0394 Unspecified dementia, unspecified severity, with anxiety: Secondary | ICD-10-CM | POA: Diagnosis present

## 2021-03-20 DIAGNOSIS — R4701 Aphasia: Secondary | ICD-10-CM | POA: Diagnosis present

## 2021-03-20 DIAGNOSIS — Z6834 Body mass index (BMI) 34.0-34.9, adult: Secondary | ICD-10-CM | POA: Diagnosis not present

## 2021-03-20 DIAGNOSIS — G8194 Hemiplegia, unspecified affecting left nondominant side: Secondary | ICD-10-CM | POA: Diagnosis present

## 2021-03-20 LAB — CBC
HCT: 33.5 % — ABNORMAL LOW (ref 36.0–46.0)
Hemoglobin: 9.9 g/dL — ABNORMAL LOW (ref 12.0–15.0)
MCH: 29.4 pg (ref 26.0–34.0)
MCHC: 29.6 g/dL — ABNORMAL LOW (ref 30.0–36.0)
MCV: 99.4 fL (ref 80.0–100.0)
Platelets: 176 10*3/uL (ref 150–400)
RBC: 3.37 MIL/uL — ABNORMAL LOW (ref 3.87–5.11)
RDW: 15.1 % (ref 11.5–15.5)
WBC: 10.9 10*3/uL — ABNORMAL HIGH (ref 4.0–10.5)
nRBC: 0 % (ref 0.0–0.2)

## 2021-03-20 LAB — ECHOCARDIOGRAM COMPLETE
Area-P 1/2: 3.08 cm2
Height: 64 in
S' Lateral: 2.5 cm
Single Plane A4C EF: 68.2 %
Weight: 3199.32 oz

## 2021-03-20 LAB — BASIC METABOLIC PANEL
Anion gap: 9 (ref 5–15)
BUN: 58 mg/dL — ABNORMAL HIGH (ref 8–23)
CO2: 25 mmol/L (ref 22–32)
Calcium: 8.2 mg/dL — ABNORMAL LOW (ref 8.9–10.3)
Chloride: 106 mmol/L (ref 98–111)
Creatinine, Ser: 1.88 mg/dL — ABNORMAL HIGH (ref 0.44–1.00)
GFR, Estimated: 25 mL/min — ABNORMAL LOW (ref >60)
Glucose, Bld: 167 mg/dL — ABNORMAL HIGH (ref 70–99)
Potassium: 4.6 mmol/L (ref 3.5–5.1)
Sodium: 140 mmol/L (ref 135–145)

## 2021-03-20 LAB — CBG MONITORING, ED
Glucose-Capillary: 165 mg/dL — ABNORMAL HIGH (ref 70–99)
Glucose-Capillary: 270 mg/dL — ABNORMAL HIGH (ref 70–99)
Glucose-Capillary: 263 mg/dL — ABNORMAL HIGH (ref 70–99)

## 2021-03-20 MED ORDER — SIMVASTATIN 20 MG PO TABS: 40.0000 mg | ORAL_TABLET | Freq: Every day | ORAL | Status: DC

## 2021-03-20 MED ORDER — EZETIMIBE 10 MG PO TABS
10.0000 mg | ORAL_TABLET | Freq: Every day | ORAL | Status: DC
  Administered 2021-03-21 – 2021-03-26 (×6): 10 mg via ORAL
  Filled 2021-03-20 (×7): qty 1

## 2021-03-20 MED ORDER — EZETIMIBE 10 MG PO TABS: 10.0000 mg | ORAL_TABLET | Freq: Every day | ORAL | Status: DC

## 2021-03-20 MED ORDER — EZETIMIBE-SIMVASTATIN 10-40 MG PO TABS
1.0000 | ORAL_TABLET | Freq: Every day | ORAL | Status: DC
  Filled 2021-03-20: qty 1

## 2021-03-20 MED ORDER — CLOPIDOGREL BISULFATE 75 MG PO TABS
75.0000 mg | ORAL_TABLET | Freq: Every day | ORAL | Status: DC
  Administered 2021-03-20 – 2021-03-28 (×9): 75 mg via ORAL
  Filled 2021-03-20 (×9): qty 1

## 2021-03-20 MED ORDER — SIMVASTATIN 20 MG PO TABS
40.0000 mg | ORAL_TABLET | Freq: Every day | ORAL | Status: DC
  Administered 2021-03-21 – 2021-03-26 (×6): 40 mg via ORAL
  Filled 2021-03-20 (×7): qty 2

## 2021-03-20 NOTE — Telephone Encounter (Signed)
Post ED Visit - Positive Culture Follow-up  Culture report reviewed by antimicrobial stewardship pharmacist: [place] Team [x]  Joetta Manners, Pharm.D. BCCCP []  Heide Guile, Pharm.D., BCPS AQ-ID []  Parks Neptune, Pharm.D., BCPS []  Alycia Rossetti, Pharm.D., BCPS []  [place], [place].D., BCPS, AAHIVP []  Legrand Como, Pharm.D., BCPS, AAHIVP []  Salome Arnt, PharmD, BCPS []  Johnnette Gourd, PharmD, BCPS []  Hughes Better, PharmD, BCPS []  Leeroy Cha, PharmD []  Laqueta Linden, PharmD, BCPS []  Albertina Parr, PharmD  [place] Team []  Leodis Sias, PharmD []  Lindell Spar, PharmD []  Royetta Asal, PharmD []  Graylin Shiver, Rph []  Rema Fendt) Glennon Mac, PharmD []  Arlyn Dunning, PharmD []  Netta Cedars, PharmD []  Dia Sitter, PharmD []  Leone [place], PharmD []  Gretta Arab, PharmD []  Theodis Shove, PharmD []  Peggyann Juba, PharmD []  Reuel Boom, PharmD   Positive urine culture Treated with Cephalexin, organism sensitive to the same and no further patient follow-up is required at this time.  Glennon Hamilton 03/20/2021, 9:37 AM
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
## 2021-03-20 NOTE — Progress Notes (Signed)
Received pt on unit  

## 2021-03-20 NOTE — Progress Notes (Signed)
  Echocardiogram 2D Echocardiogram has been performed.  Brandi Melton 03/20/2021, 9:25 AM
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
## 2021-03-20 NOTE — Progress Notes (Addendum)
HD#0 Subjective:  Overnight Events: NAEO   Patient feeling ok today, says that she is in the hospital because she has been falling. Thinks that one of the people in the room is her grandson. She did not know that she had a stroke but she is pretty upset to hear that she did. She would like to know what caused her to have a stroke.  Objective:  Vital signs in last 24 hours: Vitals:   03/20/21 0830 03/20/21 0915 03/20/21 0930 03/20/21 1000  BP: (!) 94/58 (!) 134/48 (!) 114/51 (!) 118/46  Pulse: 69 70 71 81  Resp: 13 20 20 17   Temp:      TempSrc:      SpO2: 99% 98% 98% 93%  Weight:      Height:       Supplemental O2: Room Air SpO2: 93 %   Physical Exam:  Constitutional: Elderly woman resting comfortably in bed, in no acute distress HEENT: ecchymosis of the left orbit  Cardiovascular: regular rate and rhythm, no m/r/g Pulmonary/Chest: normal work of breathing on room air, lungs clear to auscultation bilaterally Abdominal: soft, non-tender Neurological: alert & answering questions appropriately, facial droop and mild dysarthria present, 0/5 strength in left upper extremity, 3/5 strength in the LLE to hip flexion, 0/5 on dorsi/plantarflexion. Sensation intact Skin: warm and dry Psych: normal affect  Filed Weights   03/19/21 1509  Weight: 90.7 kg    No intake or output data in the 24 hours ending 03/20/21 1031 Net IO Since Admission: No IO data has been entered for this period [03/20/21 1031]  Pertinent Labs: CBC Latest Ref Rng & Units 03/20/2021 03/19/2021 03/17/2021  WBC 4.0 - 10.5 K/uL 10.9(H) 11.5(H) 10.4  Hemoglobin 12.0 - 15.0 g/dL 9.9(L) 12.1 12.5  Hematocrit 36.0 - 46.0 % 33.5(L) 41.5 41.0  Platelets 150 - 400 K/uL 176 168 199    CMP Latest Ref Rng & Units 03/20/2021 03/19/2021 03/17/2021  Glucose 70 - 99 mg/dL 167(H) 104(H) 198(H)  BUN 8 - 23 mg/dL 58(H) 62(H) 49(H)  Creatinine 0.44 - 1.00 mg/dL 1.88(H) 2.28(H) 1.69(H)  Sodium 135 - 145 mmol/L 140 140 140   Potassium 3.5 - 5.1 mmol/L 4.6 5.1 5.3(H)  Chloride 98 - 111 mmol/L 106 107 105  CO2 22 - 32 mmol/L 25 22 26   Calcium 8.9 - 10.3 mg/dL 8.2(L) 8.9 9.3  Total Protein 6.5 - 8.1 g/dL - 6.6 7.4  Total Bilirubin 0.3 - 1.2 mg/dL - 0.4 0.8  Alkaline Phos 38 - 126 U/L - 84 86  AST 15 - 41 U/L - 35 31  ALT 0 - 44 U/L - 26 25     Assessment/Plan:   Principal Problem:   Acute cerebral infarction ([place]) Active Problems:   Diabetes mellitus type 2, insulin dependent ([place])   Hypertension   Hyperlipemia   CKD (chronic kidney disease), stage IV ([facility])   AKI (acute kidney injury) ([place])   Subdural hematoma   CVA (cerebral vascular accident) [facility])   Patient Summary: Brandi Melton is a 87 y.o. with a pertinent PMH of type II DM, hyperlipidemia, hypertension, CKD stage III, gout and diastolic heart failure presenting with aphasia and code stroke.   Acute right parietal lobe infarct MRI brain showed acute ischemia within right frontal operculum, anterior right parietal lobe and posterior right insula.  Also a small acute infarct of the left temporal lobe. With the multifocal nature of these infarcts suspect embolic source could the cause. Patient's last  known normal was on 12/4, patient outside of tpa window, will try and get more collateral information from family about when these deficits first appeared. Given the significant deficits patient's ability to return to prior functional status will be challenging. - Neurology following; appreciate their recommendations - continue aspirin; no DAPT given SDH below - continue atorvastatin 80 mg - Telemetry  - f/u TTE pending -  Carotid vascular ultrasound showed mild stenosis - PT/OT eval and treat  Subdural Hematoma Patient recently sustained multiple mechanical falls (12/3 and 12/4); patient was seen in the ED for both occurences. Repeat imaging on admission 12/6 showed thin left hemispheric subdural hematoma measuring 3 mm on MRI of the brain.  --  repeat CT 24 hours from MRI to assess for progression --Frequent neuro checks --Avoid DAPT in the setting of SDH   Type 2 DM Most recent hemoglobin A1c s 7.2.  Patient is on insulin aspart 20 mg 3 times daily with meals and insulin glargine 40 units twice daily.  Blood glucose in the low 100s since admission. -CBG monitoring -SSI   Hyperlipidemia LDL on lipid panel 53, previously on moderate intensity statin therapy which was increased to high intensity after admission. -- atorvastatin 80   Hypertension Diastolic heart failure Medications include lisinopril, Coreg, and torsemide. Holding in the setting of permissive hypertension. -Allow for permissive hypertension for 24-48 hours -Strict ins and outs -Daily weights   AKI on CKD stage IIIb Creatinine on admission 2.28, improved to 1.88 today. Given patient passed swallow screen will encourage patient to take in good PO and continue to monitor. - daily bmp - Avoid nephrotoxic agents   Anemia of CKD Hemoglobin appears to be close to baseline of about 10 -- daily CBC   Stage II sacral pressure injury -Frequent repositioning and barrier cream   Gout -Continue allopurinol 100 mg daily   History of stage II melanoma Status post excision in July of this year.  Margins were clear on pathology review.   RLL lung nodule CT chest during last admission showed stable right lower lobe pulmonary nodules and recommended follow-up noncontrast chest CT in about 6 months.  Largest nodule measuring up to 8 mm.   Anxiety/depression, chronic and stable -Continue Cymbalta  Scarlett Presto, MD Internal Medicine Resident PGY-1 Pager [phone] Please contact the on call pager after 5 pm and on weekends at [phone].

## 2021-03-20 NOTE — Progress Notes (Signed)
Carotid artery duplex completed. Refer to "CV Proc" under chart review to view preliminary results.  03/20/2021 10:27 AM Kelby Aline., MHA, RVT, RDCS, RDMS

## 2021-03-20 NOTE — ED Notes (Signed)
ED TO INPATIENT HANDOFF REPORT  ED Nurse Name and Phone #: Baxter Flattery, RN [phone]  S Name/Age/Gender Brandi Melton Kitchen 87 y.o. female Room/Bed: 021C/021C  Code Status   Code Status: DNR  Home/SNF/Other Skilled nursing facility Patient oriented to: self and place Is this baseline? No   Triage Complete: Triage complete  Chief Complaint Stroke [facility]) [I63.9] CVA (cerebral vascular accident) [facility]) [I63.9]  Triage Note PT was seen here 3 days ago for fall with stroke like symptoms with slurred speech and left side weakness, Facility sent her back today stating she has gotten worse, no new episodes, LKWT was 03/17/2021 0000 hours.    Allergies No Known Allergies  Level of Care/Admitting Diagnosis ED Disposition     ED Disposition  Admit   Condition  --   Comment  Hospital Area: [place] [[id number]]  Level of Care: Telemetry Cardiac [[id number]]  May admit patient to [facility] or [facility] if equivalent level of care is available:: No  Covid Evaluation: Confirmed COVID Negative  Diagnosis: CVA (cerebral vascular accident) [facility]) [[id number]]  Admitting Physician: Axel Filler [phone]  Attending Physician: Axel Filler [[id number]]  Estimated length of stay: 3 - 4 days  Certification:: I certify this patient will need inpatient services for at least 2 midnights          B Medical/Surgery History Past Medical History:  Diagnosis Date   Arthritis    "hips before they were replaced, right knee now" (11/09/2015)   Chronic diastolic (congestive) heart failure ([place])    a. echo 06/2015: EF 65-70%, Grade 1 DD, mitral valve calcification.   CKD (chronic kidney disease), stage III (HCC)    Depression    Dyspnea    H/O cardiac catheterization    a. h/o cardiac catheterization 05/2013 in [place] showing only minimal CAD with increased filling pressure.   Hyperlipemia    Hypertensive heart disease    Morbid obesity ([place])    Palsy, Bell's    RA (rheumatoid  arthritis) ([place])    Sleep apnea    "suppose to wear mask; couldn't" (11/09/2015)   Type II diabetes mellitus [facility])    Past Surgical History:  Procedure Laterality Date   APPENDECTOMY  1966   CARDIAC CATHETERIZATION  05/2013   CARPAL TUNNEL RELEASE Right 08/08/2014   Procedure: OPEN CARPAL TUNNEL RELEASE;  Surgeon: Roseanne Kaufman, MD;  Location: [place];  Service: Orthopedics;  Laterality: Right;   CATARACT EXTRACTION W/ INTRAOCULAR LENS  IMPLANT, BILATERAL Bilateral    CHOLECYSTECTOMY OPEN  1966   COLONOSCOPY     FRACTURE SURGERY     JOINT REPLACEMENT     KNEE ARTHROPLASTY Left    ORIF WRIST FRACTURE Right 08/08/2014   Procedure: Right OPEN REDUCTION INTERNAL FIXATION (ORIF) WRIST FRACTURE WITH REPAIR AND RECONST/ALLOGRAFT AND BONE GRAFT;  Surgeon: Roseanne Kaufman, MD;  Location: [facility];  Service: Orthopedics;  Laterality: Right;   TOTAL HIP ARTHROPLASTY Bilateral      A IV Location/Drains/Wounds Patient Lines/Drains/Airways Status     Active Line/Drains/Airways     Name Placement date Placement time Site Days   Peripheral IV 03/19/21 22 G 2.5" Anterior;Right Forearm 03/19/21  1827  Forearm  1   Incision (Closed) 08/08/14 Arm Right 08/08/14  1929  -- 2416   Pressure Injury 12/17/18 Sacrum Mid Stage II -  Partial thickness loss of dermis presenting as a shallow open ulcer with a red, pink wound bed without slough. 12/17/18  0850  -- 824  Intake/Output Last 24 hours  Intake/Output Summary (Last 24 hours) at 03/20/2021 1659 Last data filed at 03/20/2021 1653 Gross per 24 hour  Intake --  Output 1000 ml  Net -1000 ml    Labs/Imaging Results for orders placed or performed during the hospital encounter of 03/19/21 (from the past 48 hour(s))  Comprehensive metabolic panel     Status: Abnormal   Collection Time: 03/19/21  3:19 PM  Result Value Ref Range   Sodium 140 135 - 145 mmol/L   Potassium 5.1 3.5 - 5.1 mmol/L   Chloride 107 98 - 111 mmol/L   CO2 22 22 - 32  mmol/L   Glucose, Bld 104 (H) 70 - 99 mg/dL    Comment: Glucose reference range applies only to samples taken after fasting for at least 8 hours.   BUN 62 (H) 8 - 23 mg/dL   Creatinine, Ser 2.28 (H) 0.44 - 1.00 mg/dL   Calcium 8.9 8.9 - 10.3 mg/dL   Total Protein 6.6 6.5 - 8.1 g/dL   Albumin 3.1 (L) 3.5 - 5.0 g/dL   AST 35 15 - 41 U/L   ALT 26 0 - 44 U/L   Alkaline Phosphatase 84 38 - 126 U/L   Total Bilirubin 0.4 0.3 - 1.2 mg/dL   GFR, Estimated 20 (L) >60 mL/min    Comment: (NOTE) Calculated using the CKD-EPI Creatinine Equation (2021)    Anion gap 11 5 - 15    Comment: Performed at [facility] [address] [place], [place] [address]  CBC with Differential     Status: Abnormal   Collection Time: 03/19/21  3:19 PM  Result Value Ref Range   WBC 11.5 (H) 4.0 - 10.5 K/uL   RBC 4.07 3.87 - 5.11 MIL/uL   Hemoglobin 12.1 12.0 - 15.0 g/dL   HCT 41.5 36.0 - 46.0 %   MCV 102.0 (H) 80.0 - 100.0 fL   MCH 29.7 26.0 - 34.0 pg   MCHC 29.2 (L) 30.0 - 36.0 g/dL   RDW 15.2 11.5 - 15.5 %   Platelets 168 150 - 400 K/uL    Comment: REPEATED TO VERIFY   nRBC 0.0 0.0 - 0.2 %   Neutrophils Relative % 61 %   Neutro Abs 7.0 1.7 - 7.7 K/uL   Lymphocytes Relative 25 %   Lymphs Abs 2.9 0.7 - 4.0 K/uL   Monocytes Relative 11 %   Monocytes Absolute 1.2 (H) 0.1 - 1.0 K/uL   Eosinophils Relative 2 %   Eosinophils Absolute 0.2 0.0 - 0.5 K/uL   Basophils Relative 0 %   Basophils Absolute 0.0 0.0 - 0.1 K/uL   Immature Granulocytes 1 %   Abs Immature Granulocytes 0.09 (H) 0.00 - 0.07 K/uL    Comment: Performed at [facility], [address], [place],  [address]  Resp Panel by RT-PCR (Flu A&B, Covid) Nasopharyngeal Swab     Status: None   Collection Time: 03/19/21  3:22 PM   Specimen: Nasopharyngeal Swab; Nasopharyngeal(NP) swabs in vial transport medium  Result Value Ref Range   SARS Coronavirus 2 by RT PCR NEGATIVE NEGATIVE    Comment: (NOTE) SARS-CoV-2 target nucleic acids  are NOT DETECTED.  The SARS-CoV-2 RNA is generally detectable in upper respiratory specimens during the acute phase of infection. The lowest concentration of SARS-CoV-2 viral copies this assay can detect is 138 copies/mL. A negative result does not preclude SARS-Cov-2 infection and should not be used as the sole basis  for treatment or other patient management decisions. A negative result may occur with  improper specimen collection/handling, submission of specimen other than nasopharyngeal swab, presence of viral mutation(s) within the areas targeted by this assay, and inadequate number of viral copies(<138 copies/mL). A negative result must be combined with clinical observations, patient history, and epidemiological information. The expected result is Negative.  Fact Sheet for Patients:  EntrepreneurPulse.com.au  Fact Sheet for Healthcare Providers:  IncredibleEmployment.be  This test is no t yet approved or cleared by the [place] FDA and  has been authorized for detection and/or diagnosis of SARS-CoV-2 by FDA under an Emergency Use Authorization (EUA). This EUA will remain  in effect (meaning this test can be used) for the duration of the COVID-19 declaration under Section 564(b)(1) of the Act, 21 U.S.C.section 360bbb-3(b)(1), unless the authorization is terminated  or revoked sooner.       Influenza A by PCR NEGATIVE NEGATIVE   Influenza B by PCR NEGATIVE NEGATIVE    Comment: (NOTE) The Xpert Xpress SARS-CoV-2/FLU/RSV plus assay is intended as an aid in the diagnosis of influenza from Nasopharyngeal swab specimens and should not be used as a sole basis for treatment. Nasal washings and aspirates are unacceptable for Xpert Xpress SARS-CoV-2/FLU/RSV testing.  Fact Sheet for Patients: EntrepreneurPulse.com.au  Fact Sheet for Healthcare Providers: IncredibleEmployment.be  This test is not yet  approved or cleared by the [place] FDA and has been authorized for detection and/or diagnosis of SARS-CoV-2 by FDA under an Emergency Use Authorization (EUA). This EUA will remain in effect (meaning this test can be used) for the duration of the COVID-19 declaration under Section 564(b)(1) of the Act, 21 U.S.C. section 360bbb-3(b)(1), unless the authorization is terminated or revoked.  Performed at [facility], [place] [address] [place], [place] [address]   CBG monitoring, ED     Status: Abnormal   Collection Time: 03/19/21  3:39 PM  Result Value Ref Range   Glucose-Capillary 105 (H) 70 - 99 mg/dL    Comment: Glucose reference range applies only to samples taken after fasting for at least 8 hours.  Urinalysis, Routine w reflex microscopic     Status: Abnormal   Collection Time: 03/19/21  6:31 PM  Result Value Ref Range   Color, Urine YELLOW YELLOW   APPearance CLEAR CLEAR   Specific Gravity, Urine 1.010 1.005 - 1.030   pH 6.0 5.0 - 8.0   Glucose, UA NEGATIVE NEGATIVE mg/dL   Hgb urine dipstick NEGATIVE NEGATIVE   Bilirubin Urine NEGATIVE NEGATIVE   Ketones, ur NEGATIVE NEGATIVE mg/dL   Protein, ur NEGATIVE NEGATIVE mg/dL   Nitrite NEGATIVE NEGATIVE   Leukocytes,Ua TRACE (A) NEGATIVE    Comment: Performed at [facility] [address] [place], [place] [address]  Urinalysis, Microscopic (reflex)     Status: Abnormal   Collection Time: 03/19/21  6:31 PM  Result Value Ref Range   RBC / HPF 0-5 0 - 5 RBC/hpf   WBC, UA 11-20 0 - 5 WBC/hpf   Bacteria, UA RARE (A) NONE SEEN   Squamous Epithelial / LPF 0-5 0 - 5   Hyaline Casts, UA PRESENT     Comment: Performed at [facility], [place] [address] [place], [place] [address]  Hemoglobin A1c     Status: Abnormal   Collection Time: 03/19/21  8:27 PM  Result Value Ref Range   Hgb A1c MFr Bld 7.2 (H) 4.8 - 5.6 %    Comment: (NOTE)  Pre diabetes:          5.7%-6.4%  Diabetes:              >6.4%  Glycemic  control for   <7.0% adults with diabetes    Mean Plasma Glucose 159.94 mg/dL    Comment: Performed at [facility] [address] [place], [place] [address]  Lipid panel     Status: Abnormal   Collection Time: 03/19/21  8:27 PM  Result Value Ref Range   Cholesterol 95 0 - 200 mg/dL   Triglycerides 87 <150 mg/dL   HDL 25 (L) >40 mg/dL   Total CHOL/HDL Ratio 3.8 RATIO   VLDL 17 0 - 40 mg/dL   LDL Cholesterol 53 0 - 99 mg/dL    Comment:        Total Cholesterol/HDL:CHD Risk Coronary Heart Disease Risk Table                     Men   Women  1/2 Average Risk   3.4   3.3  Average Risk       5.0   4.4  2 X Average Risk   9.6   7.1  3 X Average Risk  23.4   11.0        Use the calculated Patient Ratio above and the CHD Risk Table to determine the patient's CHD Risk.        ATP III CLASSIFICATION (LDL):  <100     mg/dL   Optimal  100-129  mg/dL   Near or Above                    Optimal  130-159  mg/dL   Borderline  160-189  mg/dL   High  >190     mg/dL   Very High Performed at [facility] [address], [place], [place] [address]   CBC     Status: Abnormal   Collection Time: 03/20/21  3:00 AM  Result Value Ref Range   WBC 10.9 (H) 4.0 - 10.5 K/uL   RBC 3.37 (L) 3.87 - 5.11 MIL/uL   Hemoglobin 9.9 (L) 12.0 - 15.0 g/dL   HCT 33.5 (L) 36.0 - 46.0 %   MCV 99.4 80.0 - 100.0 fL   MCH 29.4 26.0 - 34.0 pg   MCHC 29.6 (L) 30.0 - 36.0 g/dL   RDW 15.1 11.5 - 15.5 %   Platelets 176 150 - 400 K/uL   nRBC 0.0 0.0 - 0.2 %    Comment: Performed at [facility], [place] [address] [place], [place] [address]  Basic metabolic panel     Status: Abnormal   Collection Time: 03/20/21  3:00 AM  Result Value Ref Range   Sodium 140 135 - 145 mmol/L   Potassium 4.6 3.5 - 5.1 mmol/L   Chloride 106 98 - 111 mmol/L   CO2 25 22 - 32 mmol/L   Glucose, Bld 167 (H) 70 - 99 mg/dL    Comment: Glucose reference range applies only to samples taken after fasting for at least 8 hours.    BUN 58 (H) 8 - 23 mg/dL   Creatinine, Ser 1.88 (H) 0.44 - 1.00 mg/dL   Calcium 8.2 (L) 8.9 - 10.3 mg/dL   GFR, Estimated 25 (L) >60 mL/min    Comment: (NOTE) Calculated using the CKD-EPI Creatinine Equation (2021)    Anion gap 9 5 - 15    Comment:  Performed at [facility], [place] [address], [place], [place] [address]  CBG monitoring, ED     Status: Abnormal   Collection Time: 03/20/21  7:55 AM  Result Value Ref Range   Glucose-Capillary 165 (H) 70 - 99 mg/dL    Comment: Glucose reference range applies only to samples taken after fasting for at least 8 hours.  CBG monitoring, ED     Status: Abnormal   Collection Time: 03/20/21 12:05 PM  Result Value Ref Range   Glucose-Capillary 270 (H) 70 - 99 mg/dL    Comment: Glucose reference range applies only to samples taken after fasting for at least 8 hours.  CBG monitoring, ED     Status: Abnormal   Collection Time: 03/20/21  4:25 PM  Result Value Ref Range   Glucose-Capillary 263 (H) 70 - 99 mg/dL    Comment: Glucose reference range applies only to samples taken after fasting for at least 8 hours.   Comment 1 Notify RN    Comment 2 Document in Chart    CT HEAD WO CONTRAST (5MM)  Result Date: 03/20/2021 CLINICAL DATA:  Stroke follow-up EXAM: CT HEAD WITHOUT CONTRAST TECHNIQUE: Contiguous axial images were obtained from the base of the skull through the vertex without intravenous contrast. COMPARISON:  CT head [id number] 5:17 p.m., MR head 03/19/2021 FINDINGS: Brain: Expected evolution of an acute right frontoparietal and posterior insular infarction. Patchy and confluent areas of decreased attenuation are noted throughout the deep and periventricular white matter of the cerebral hemispheres bilaterally, compatible with chronic microvascular ischemic disease. Otherwise no parenchymal hemorrhage. No mass lesion. Redemonstration of a likely subacute up to 5 mm left subdural hematoma along the left calvarial convexity. No mass effect or midline  shift. No hydrocephalus. Basilar cisterns are patent. Vascular: No hyperdense vessel. Atherosclerotic calcifications are present within the cavernous internal carotid arteries. Skull: No acute fracture or focal lesion. Sinuses/Orbits: Sphenoid mucosal thickening.Paranasal sinuses and mastoid air cells are clear. The orbits are unremarkable. Other: Mild left frontal scalp edema. IMPRESSION: 1. Expected evolution of an acute right frontoparietal and posterior insular infarction. 2. Grossly stable to possibly minimally increased in size, likely subacute, up to 5 mm, left subdural hematoma. No associated mass effect. 3. No new intracranial abnormality. Electronically Signed   By: Iven Finn M.D.   On: 03/20/2021 15:19   CT Head Wo Contrast  Result Date: 03/19/2021 CLINICAL DATA:  Trauma, left-sided weakness.  Recent fall. EXAM: CT HEAD WITHOUT CONTRAST CT CERVICAL SPINE WITHOUT CONTRAST TECHNIQUE: Multidetector CT imaging of the head and cervical spine was performed following the standard protocol without intravenous contrast. Multiplanar CT image reconstructions of the cervical spine were also generated. COMPARISON:  CT head and cervical spine 03/17/2021. FINDINGS: CT HEAD FINDINGS Brain: There is a small area of deep white matter and cortical hypodensity in the right parietal lobe image 3/18 which is new from prior worrisome for acute infarct. There is mild sulcal effacement without significant mass effect or midline shift. There is no acute intracranial hemorrhage or extra-axial fluid collection. There is no hydrocephalus. There is mild diffuse atrophy, unchanged. There is stable mild periventricular white matter hypodensity, likely chronic small vessel ischemic change. Vascular: No hyperdense vessel or unexpected calcification. Skull: Normal. Negative for fracture or focal lesion. Sinuses/Orbits: There is mucosal thickening of the right sphenoid sinus. Mastoid air cells are clear. Other: Left frontal scalp  soft tissue swelling is again noted, decreased from prior. CT CERVICAL SPINE FINDINGS Alignment: There is stable  3 mm of anterolisthesis at C3-C4 which is likely degenerative. Alignment is otherwise anatomic. Skull base and vertebrae: No acute fracture. No primary bone lesion or focal pathologic process. Soft tissues and spinal canal: No prevertebral fluid or swelling. No visible canal hematoma. Disc levels: There is significant disc space narrowing and endplate osteophyte formation throughout the mid and lower cervical spine which is similar to the prior examination. There is also significant bilateral facet joint degenerative change. Multilevel neural foraminal stenosis on the left appears similar to the prior examination. Mild central canal stenosis at C5-C6 is also similar to the prior examination. Upper chest: Negative. Other: None. IMPRESSION: 1. Small acute infarct in the right parietal lobe. No midline shift. No acute intracranial hemorrhage. 2. No acute fracture or traumatic subluxation of the cervical spine. Electronically Signed   By: Ronney Asters M.D.   On: 03/19/2021 17:47   CT Cervical Spine Wo Contrast  Result Date: 03/19/2021 CLINICAL DATA:  Trauma, left-sided weakness.  Recent fall. EXAM: CT HEAD WITHOUT CONTRAST CT CERVICAL SPINE WITHOUT CONTRAST TECHNIQUE: Multidetector CT imaging of the head and cervical spine was performed following the standard protocol without intravenous contrast. Multiplanar CT image reconstructions of the cervical spine were also generated. COMPARISON:  CT head and cervical spine 03/17/2021. FINDINGS: CT HEAD FINDINGS Brain: There is a small area of deep white matter and cortical hypodensity in the right parietal lobe image 3/18 which is new from prior worrisome for acute infarct. There is mild sulcal effacement without significant mass effect or midline shift. There is no acute intracranial hemorrhage or extra-axial fluid collection. There is no hydrocephalus. There  is mild diffuse atrophy, unchanged. There is stable mild periventricular white matter hypodensity, likely chronic small vessel ischemic change. Vascular: No hyperdense vessel or unexpected calcification. Skull: Normal. Negative for fracture or focal lesion. Sinuses/Orbits: There is mucosal thickening of the right sphenoid sinus. Mastoid air cells are clear. Other: Left frontal scalp soft tissue swelling is again noted, decreased from prior. CT CERVICAL SPINE FINDINGS Alignment: There is stable 3 mm of anterolisthesis at C3-C4 which is likely degenerative. Alignment is otherwise anatomic. Skull base and vertebrae: No acute fracture. No primary bone lesion or focal pathologic process. Soft tissues and spinal canal: No prevertebral fluid or swelling. No visible canal hematoma. Disc levels: There is significant disc space narrowing and endplate osteophyte formation throughout the mid and lower cervical spine which is similar to the prior examination. There is also significant bilateral facet joint degenerative change. Multilevel neural foraminal stenosis on the left appears similar to the prior examination. Mild central canal stenosis at C5-C6 is also similar to the prior examination. Upper chest: Negative. Other: None. IMPRESSION: 1. Small acute infarct in the right parietal lobe. No midline shift. No acute intracranial hemorrhage. 2. No acute fracture or traumatic subluxation of the cervical spine. Electronically Signed   By: Ronney Asters M.D.   On: 03/19/2021 17:47   MR ANGIO HEAD WO CONTRAST  Result Date: 03/19/2021 CLINICAL DATA:  Stroke follow-up EXAM: MRI HEAD WITHOUT CONTRAST MRA HEAD WITHOUT CONTRAST TECHNIQUE: Multiplanar, multi-echo pulse sequences of the brain and surrounding structures were acquired without intravenous contrast. Angiographic images of the Circle of Willis were acquired using MRA technique without intravenous contrast. COMPARISON:  10/04/2020 brain MRI Head CT 03/19/2021 FINDINGS: MRI  HEAD FINDINGS Brain: Medium-sized area of abnormal diffusion restriction within the right frontal operculum, anterior right parietal lobe and posterior right insula. Small area of diffusion restriction in the left temporal lobe.  There is a thin left hemispheric subdural hematoma measuring 3 mm. There is multifocal hyperintense T2-weighted signal within the white matter. Generalized volume loss without a clear lobar predilection. Normal midline structures. Vascular: Major flow voids are preserved. Skull and upper cervical spine: Normal calvarium and skull base. Visualized upper cervical spine and soft tissues are normal. Sinuses/Orbits:No paranasal sinus fluid levels or advanced mucosal thickening. No mastoid or middle ear effusion. Normal orbits. MRA HEAD FINDINGS POSTERIOR CIRCULATION: --Vertebral arteries: Normal --Inferior cerebellar arteries: Normal. --Basilar artery: Normal. --Superior cerebellar arteries: Normal. --Posterior cerebral arteries: Normal. ANTERIOR CIRCULATION: --Intracranial internal carotid arteries: Normal. --Anterior cerebral arteries (ACA): Normal. --Middle cerebral arteries (MCA): Normal. ANATOMIC VARIANTS: None IMPRESSION: 1. Medium-sized area of acute ischemia within the right frontal operculum, anterior right parietal lobe and posterior right insula. 2. Small acute infarct of the left temporal lobe. 3. Normal intracranial MRA. 4. Thin left hemispheric subdural hematoma measuring 3 mm. These results were communicated to [place] at 9:46 pm on 03/19/2021 by text page via the [facility] messaging system. Electronically Signed   By: Ulyses Jarred M.D.   On: 03/19/2021 21:46   MR BRAIN WO CONTRAST  Result Date: 03/19/2021 CLINICAL DATA:  Stroke follow-up EXAM: MRI HEAD WITHOUT CONTRAST MRA HEAD WITHOUT CONTRAST TECHNIQUE: Multiplanar, multi-echo pulse sequences of the brain and surrounding structures were acquired without intravenous contrast. Angiographic images of the Circle of Willis were  acquired using MRA technique without intravenous contrast. COMPARISON:  10/04/2020 brain MRI Head CT 03/19/2021 FINDINGS: MRI HEAD FINDINGS Brain: Medium-sized area of abnormal diffusion restriction within the right frontal operculum, anterior right parietal lobe and posterior right insula. Small area of diffusion restriction in the left temporal lobe. There is a thin left hemispheric subdural hematoma measuring 3 mm. There is multifocal hyperintense T2-weighted signal within the white matter. Generalized volume loss without a clear lobar predilection. Normal midline structures. Vascular: Major flow voids are preserved. Skull and upper cervical spine: Normal calvarium and skull base. Visualized upper cervical spine and soft tissues are normal. Sinuses/Orbits:No paranasal sinus fluid levels or advanced mucosal thickening. No mastoid or middle ear effusion. Normal orbits. MRA HEAD FINDINGS POSTERIOR CIRCULATION: --Vertebral arteries: Normal --Inferior cerebellar arteries: Normal. --Basilar artery: Normal. --Superior cerebellar arteries: Normal. --Posterior cerebral arteries: Normal. ANTERIOR CIRCULATION: --Intracranial internal carotid arteries: Normal. --Anterior cerebral arteries (ACA): Normal. --Middle cerebral arteries (MCA): Normal. ANATOMIC VARIANTS: None IMPRESSION: 1. Medium-sized area of acute ischemia within the right frontal operculum, anterior right parietal lobe and posterior right insula. 2. Small acute infarct of the left temporal lobe. 3. Normal intracranial MRA. 4. Thin left hemispheric subdural hematoma measuring 3 mm. These results were communicated to [place] at 9:46 pm on 03/19/2021 by text page via the [facility] messaging system. Electronically Signed   By: Ulyses Jarred M.D.   On: 03/19/2021 21:46   ECHOCARDIOGRAM COMPLETE  Result Date: 03/20/2021    ECHOCARDIOGRAM REPORT   Patient Name:   Brandi Melton Date of Exam: 03/20/2021 Medical Rec #:  [id number]    Height:       64.0 in Accession #:     [id number]   Weight:       200.0 lb Date of Birth:  01-17-1931    BSA:          1.956 m Patient Age:    39 years     BP:           134/48 mmHg Patient Gender: F  HR:           70 bpm. Exam Location:  Inpatient Procedure: 2D Echo Indications:    stroke  History:        Patient has prior history of Echocardiogram examinations, most                 recent 04/05/2017. Chronic kidney disease; Risk                 Factors:Diabetes, Hypertension and Dyslipidemia.  Sonographer:    Johny Chess RDCS Referring Phys: [phone] [place]  1. Left ventricular ejection fraction, by estimation, is 60 to 65%. The left ventricle has normal function. The left ventricle has no regional wall motion abnormalities. Left ventricular diastolic parameters are consistent with Grade I diastolic dysfunction (impaired relaxation). Elevated left atrial pressure.  2. Right ventricular systolic function is normal. The right ventricular size is normal.  3. Left atrial size was mildly dilated.  4. The mitral valve is abnormal. Trivial mitral valve regurgitation. No evidence of mitral stenosis. Severe mitral annular calcification.  5. The aortic valve is tricuspid. Aortic valve regurgitation is not visualized. Aortic valve sclerosis is present, with no evidence of aortic valve stenosis.  6. The inferior vena cava is normal in size with greater than 50% respiratory variability, suggesting right atrial pressure of 3 mmHg. Comparison(s): Prior images unable to be directly viewed, comparison made by report only. FINDINGS  Left Ventricle: Left ventricular ejection fraction, by estimation, is 60 to 65%. The left ventricle has normal function. The left ventricle has no regional wall motion abnormalities. The left ventricular internal cavity size was normal in size. There is  no left ventricular hypertrophy. Left ventricular diastolic parameters are consistent with Grade I diastolic dysfunction (impaired relaxation). Elevated  left atrial pressure. Right Ventricle: The right ventricular size is normal. No increase in right ventricular wall thickness. Right ventricular systolic function is normal. Left Atrium: Left atrial size was mildly dilated. Right Atrium: Right atrial size was normal in size. Pericardium: There is no evidence of pericardial effusion. Mitral Valve: The mitral valve is abnormal. Severe mitral annular calcification. Trivial mitral valve regurgitation. No evidence of mitral valve stenosis. Tricuspid Valve: The tricuspid valve is normal in structure. Tricuspid valve regurgitation is trivial. No evidence of tricuspid stenosis. Aortic Valve: The aortic valve is tricuspid. Aortic valve regurgitation is not visualized. Aortic valve sclerosis is present, with no evidence of aortic valve stenosis. Pulmonic Valve: The pulmonic valve was normal in structure. Pulmonic valve regurgitation is trivial. No evidence of pulmonic stenosis. Aorta: The aortic root is normal in size and structure. Venous: The inferior vena cava is normal in size with greater than 50% respiratory variability, suggesting right atrial pressure of 3 mmHg. IAS/Shunts: No atrial level shunt detected by color flow Doppler.  LEFT VENTRICLE PLAX 2D LVIDd:         4.30 cm     Diastology LVIDs:         2.50 cm     LV e' medial:    5.77 cm/s LV PW:         1.00 cm     LV E/e' medial:  23.9 LV IVS:        1.00 cm     LV e' lateral:   6.31 cm/s LVOT diam:     1.90 cm     LV E/e' lateral: 21.9 LV SV:         71 LV SV Index:  36 LVOT Area:     2.84 cm  LV Volumes (MOD) LV vol d, MOD A4C: 80.7 ml LV vol s, MOD A4C: 25.7 ml LV SV MOD A4C:     80.7 ml RIGHT VENTRICLE             IVC RV S prime:     10.80 cm/s  IVC diam: 1.70 cm TAPSE (M-mode): 2.3 cm LEFT ATRIUM             Index        RIGHT ATRIUM           Index LA diam:        3.90 cm 1.99 cm/m   RA Area:     13.40 cm LA Vol (A2C):   81.0 ml 41.41 ml/m  RA Volume:   31.40 ml  16.05 ml/m LA Vol (A4C):   52.4 ml 26.79  ml/m LA Biplane Vol: 65.1 ml 33.28 ml/m  AORTIC VALVE LVOT Vmax:   112.00 cm/s LVOT Vmean:  80.100 cm/s LVOT VTI:    0.250 m  AORTA Ao Root diam: 3.10 cm Ao Asc diam:  3.10 cm MITRAL VALVE MV Area (PHT): 3.08 cm     SHUNTS MV Decel Time: 246 msec     Systemic VTI:  0.25 m MV E velocity: 138.00 cm/s  Systemic Diam: 1.90 cm MV A velocity: 179.00 cm/s MV E/A ratio:  0.77 Kirk Ruths MD Electronically signed by Kirk Ruths MD Signature Date/Time: 03/20/2021/12:00:29 PM    Final    VAS US CAROTID (at [facility] and [facility] only)  Result Date: 03/20/2021 Carotid Arterial Duplex Study Patient Name:  Brandi Melton  Date of Exam:   03/20/2021 Medical Rec #: [id number]     Accession #:    [id number] Date of Birth: 1930-05-16     Patient Gender: F Patient Age:   69 years Exam Location:  [facility] Procedure:      VAS US CAROTID Referring Phys: Rosalin Hawking --------------------------------------------------------------------------------  Indications:       CVA. Risk Factors:      Hypertension, hyperlipidemia, Diabetes. Limitations        Today's exam was limited due to the patient's inability or                    unwillingness to cooperate and the body habitus of the                    patient. Comparison Study:  No prior study Performing Technologist: Maudry Mayhew MHA, RDMS, RVT, RDCS  Examination Guidelines: A complete evaluation includes B-mode imaging, spectral Doppler, color Doppler, and power Doppler as needed of all accessible portions of each vessel. Bilateral testing is considered an integral part of a complete examination. Limited examinations for reoccurring indications may be performed as noted.  Right Carotid Findings: +----------+--------+--------+--------+------------------+---------+           PSV cm/sEDV cm/sStenosisPlaque DescriptionComments  +----------+--------+--------+--------+------------------+---------+ CCA Prox  84      9               heterogenous                 +----------+--------+--------+--------+------------------+---------+ CCA Distal98      11                                          +----------+--------+--------+--------+------------------+---------+  ICA Prox  123     11              calcific          Shadowing +----------+--------+--------+--------+------------------+---------+ ICA Distal140     18                                          +----------+--------+--------+--------+------------------+---------+ ECA       182                                                 +----------+--------+--------+--------+------------------+---------+ +----------+--------+-------+----------------+-------------------+           PSV cm/sEDV cmsDescribe        Arm Pressure (mmHG) +----------+--------+-------+----------------+-------------------+ Subclavian160            Multiphasic, WNL                    +----------+--------+-------+----------------+-------------------+ +---------+--------+--+--------+--+---------+ VertebralPSV cm/s72EDV cm/s10Antegrade +---------+--------+--+--------+--+---------+  Left Carotid Findings: +----------+--------+--------+--------+-------------------------+---------+           PSV cm/sEDV cm/sStenosisPlaque Description       Comments  +----------+--------+--------+--------+-------------------------+---------+ CCA Prox  133     18              smooth and heterogenous            +----------+--------+--------+--------+-------------------------+---------+ CCA Distal88      9               heterogenous and calcificShadowing +----------+--------+--------+--------+-------------------------+---------+ ICA Prox  99      16              hyperechoic and calcific Shadowing +----------+--------+--------+--------+-------------------------+---------+ ICA Distal123     22                                                  +----------+--------+--------+--------+-------------------------+---------+ ECA       117     5               hyperechoic and calcific shadowing +----------+--------+--------+--------+-------------------------+---------+ +----------+--------+--------+----------------+-------------------+           PSV cm/sEDV cm/sDescribe        Arm Pressure (mmHG) +----------+--------+--------+----------------+-------------------+ [id number]             Multiphasic, WNL                    +----------+--------+--------+----------------+-------------------+ +---------+--------+--+--------+--+---------+ VertebralPSV cm/s86EDV cm/s16Antegrade +---------+--------+--+--------+--+---------+   Summary: Right Carotid: Velocities in the right ICA are consistent with a 1-39% stenosis. Left Carotid: Velocities in the left ICA are consistent with a 1-39% stenosis. Vertebrals:  Bilateral vertebral arteries demonstrate antegrade flow. Subclavians: Normal flow hemodynamics were seen in bilateral subclavian              arteries. *See table(s) above for measurements and observations.     Preliminary     Pending Labs Unresulted Labs (From admission, onward)     Start     Ordered   03/21/21 0500  CBC  Tomorrow morning,   R        03/20/21 1050   03/21/21 7412  Basic metabolic panel  Tomorrow morning,   R        03/20/21 1050            Vitals/Pain Today's Vitals   03/20/21 1330 03/20/21 1400 03/20/21 1430 03/20/21 1550  BP: (!) 109/42 (!) 105/38 (!) 123/44 (!) 134/47  Pulse: 73 67 68 69  Resp: (!) 22 14 19 18   Temp:      TempSrc:      SpO2: 95% 94% 94% 94%  Weight:      Height:      PainSc:        Isolation Precautions No active isolations  Medications Medications  allopurinol (ZYLOPRIM) tablet 100 mg (100 mg Oral Given 03/20/21 0907)  DULoxetine (CYMBALTA) DR capsule 30 mg (has no administration in time range)  calcium carbonate (TUMS - dosed in mg elemental calcium) chewable tablet  200 mg of elemental calcium (200 mg of elemental calcium Oral Given 03/20/21 1650)  magnesium oxide (MAG-OX) tablet 400 mg (400 mg Oral Given 03/20/21 6440)  cholecalciferol (VITAMIN D3) tablet 2,000 Units (2,000 Units Oral Given 03/20/21 0908)  nystatin (MYCOSTATIN/NYSTOP) topical powder 1 application (1 application Topical Not Given 03/20/21 0912)  acetaminophen (TYLENOL) tablet 650 mg (has no administration in time range)    Or  acetaminophen (TYLENOL) 160 MG/5ML solution 650 mg (has no administration in time range)    Or  acetaminophen (TYLENOL) suppository 650 mg (has no administration in time range)  senna-docusate (Senokot-S) tablet 1 tablet (has no administration in time range)  heparin injection 5,000 Units (5,000 Units Subcutaneous Given 03/20/21 1322)  insulin aspart (novoLOG) injection 0-15 Units (8 Units Subcutaneous Given 03/20/21 1651)  ezetimibe-simvastatin (VYTORIN) 10-40 MG per tablet 1 tablet (has no administration in time range)  clopidogrel (PLAVIX) tablet 75 mg (75 mg Oral Given 03/20/21 1550)  0.9 %  sodium chloride infusion (0 mLs Intravenous Stopped 03/20/21 0521)   stroke: mapping our early stages of recovery book ( Does not apply Given 03/19/21 2027)    Mobility walks with device High fall risk   Focused Assessments Neuro Assessment Handoff:  Swallow screen pass? Yes  Cardiac Rhythm: Normal sinus rhythm NIH Stroke Scale ( + Modified Stroke Scale Criteria)  Interval: Shift assessment Level of Consciousness (1a.)   : Not alert, but arousable by minor stimulation to obey, answer, or respond LOC Questions (1b. )   +: Answers one question correctly LOC Commands (1c. )   + : Performs both tasks correctly Best Gaze (2. )  +: Normal Visual (3. )  +: No visual loss Facial Palsy (4. )    : Minor paralysis Motor Arm, Left (5a. )   +: No effort against gravity Motor Arm, Right (5b. )   +: No drift Motor Leg, Left (6a. )   +: No effort against gravity Motor Leg, Right (6b.  )   +: No drift Limb Ataxia (7. ): Absent Sensory (8. )   +: Normal, no sensory loss Best Language (9. )   +: Mild-to-moderate aphasia Dysarthria (10. ): Mild-to-moderate dysarthria, patient slurs at least some words and, at worst, can be understood with some difficulty Extinction/Inattention (11.)   +: No Abnormality Modified SS Total  +: 8 Complete NIHSS TOTAL: 7 Last date known well: 03/17/21 Last time known well: 0000 Neuro Assessment: Exceptions to [facility] Neuro Checks:   Initial (03/19/21 1516)  Last Documented NIHSS Modified Score: 8 (03/20/21 1600) Has TPA been given? No If patient is a Neuro Trauma and patient is going to  OR before floor call report to [place] nurse: [phone] or [phone]   R Recommendations: See Admitting Provider Note  Report given to:   Additional Notes:

## 2021-03-20 NOTE — Progress Notes (Addendum)
STROKE TEAM PROGRESS NOTE  HISTORY OF PRESENT ILLNESS (per record) CORNELLA EMMER is a 87 y.o. Caucasian female with PMH of hypertension, diabetes, CKD, dementia, Bell's palsy, breast cancer admitted for stroke. Patient lives in the nursing facility.  On 03/16/2021, she had an ED visit for increasing following at nursing facility, whole body weakness and difficulty walking.  CT no acute abnormality.  Found to have UTI with WBC 11-20 in UA, put on Keflex on discharge.  Second day 03/17/2021, patient again had an ED visit after a fall in the nursing facility hitting her left side of head with left scalp with dried blood and left orbital ecchymosis on presentation.  CT no acute abnormality.  Patient will discharge back to nursing facility with an intact neurological examination.  However, since then, patient was found to have slurred speech and left-sided weakness, left facial droop.  She was sent back to ED today for evaluation.  CT head repeat showed right parietal small to moderate sized infarct.  She was also found to have AKI on CKD with creatinine up to 2.28 from 1.692 days ago.  WBC 11.5 leukocytosis.  Neurology was consulted for new stroke.   LSN: 2 days ago tPA Given: No: Outside window   INTERVAL HISTORY No family at the bedside.  Patient presented to the ED   She presented with slurred speech and left-sided weakness.  MRI scan shows subacute right frontoparietal MCA branch as well as a smaller left temporal embolic infarcts.  There is small left convexity subdural hematoma as well likely related to the fall 3 days ago.   OBJECTIVE Vitals:   03/20/21 0145 03/20/21 0515 03/20/21 0700 03/20/21 0715  BP: (!) 118/41 (!) 108/44 (!) 105/37 (!) 119/44  Pulse: 66 66 66 68  Resp: 20 18 (!) 24 18  Temp:      TempSrc:      SpO2: 96% 95% 95% 98%  Weight:      Height:        CBC:  Recent Labs  Lab 03/17/21 2016 03/19/21 1519 03/20/21 0300  WBC 10.4 11.5* 10.9*  NEUTROABS 6.7 7.0  --   HGB  12.5 12.1 9.9*  HCT 41.0 41.5 33.5*  MCV 98.3 102.0* 99.4  PLT 199 168 784    Basic Metabolic Panel:  Recent Labs  Lab 03/19/21 1519 03/20/21 0300  NA 140 140  K 5.1 4.6  CL 107 106  CO2 22 25  GLUCOSE 104* 167*  BUN 62* 58*  CREATININE 2.28* 1.88*  CALCIUM 8.9 8.2*    Lipid Panel:     Component Value Date/Time   CHOL 95 03/19/2021 2027   TRIG 87 03/19/2021 2027   HDL 25 (L) 03/19/2021 2027   CHOLHDL 3.8 03/19/2021 2027   VLDL 17 03/19/2021 2027   LDLCALC 53 03/19/2021 2027   HgbA1c:  Lab Results  Component Value Date   HGBA1C 7.2 (H) 03/19/2021   Urine Drug Screen:     Component Value Date/Time   LABOPIA NONE DETECTED 03/16/2021 1150   COCAINSCRNUR NONE DETECTED 03/16/2021 1150   LABBENZ NONE DETECTED 03/16/2021 1150   AMPHETMU NONE DETECTED 03/16/2021 1150   THCU NONE DETECTED 03/16/2021 1150   LABBARB NONE DETECTED 03/16/2021 1150    Alcohol Level     Component Value Date/Time   ETH <10 03/16/2021 1150    IMAGING   CT Head Wo Contrast  Result Date: 03/19/2021 CLINICAL DATA:  Trauma, left-sided weakness.  Recent fall. EXAM: CT HEAD WITHOUT  CONTRAST CT CERVICAL SPINE WITHOUT CONTRAST TECHNIQUE: Multidetector CT imaging of the head and cervical spine was performed following the standard protocol without intravenous contrast. Multiplanar CT image reconstructions of the cervical spine were also generated. COMPARISON:  CT head and cervical spine 03/17/2021. FINDINGS: CT HEAD FINDINGS Brain: There is a small area of deep white matter and cortical hypodensity in the right parietal lobe image 3/18 which is new from prior worrisome for acute infarct. There is mild sulcal effacement without significant mass effect or midline shift. There is no acute intracranial hemorrhage or extra-axial fluid collection. There is no hydrocephalus. There is mild diffuse atrophy, unchanged. There is stable mild periventricular white matter hypodensity, likely chronic small vessel  ischemic change. Vascular: No hyperdense vessel or unexpected calcification. Skull: Normal. Negative for fracture or focal lesion. Sinuses/Orbits: There is mucosal thickening of the right sphenoid sinus. Mastoid air cells are clear. Other: Left frontal scalp soft tissue swelling is again noted, decreased from prior. CT CERVICAL SPINE FINDINGS Alignment: There is stable 3 mm of anterolisthesis at C3-C4 which is likely degenerative. Alignment is otherwise anatomic. Skull base and vertebrae: No acute fracture. No primary bone lesion or focal pathologic process. Soft tissues and spinal canal: No prevertebral fluid or swelling. No visible canal hematoma. Disc levels: There is significant disc space narrowing and endplate osteophyte formation throughout the mid and lower cervical spine which is similar to the prior examination. There is also significant bilateral facet joint degenerative change. Multilevel neural foraminal stenosis on the left appears similar to the prior examination. Mild central canal stenosis at C5-C6 is also similar to the prior examination. Upper chest: Negative. Other: None. IMPRESSION: 1. Small acute infarct in the right parietal lobe. No midline shift. No acute intracranial hemorrhage. 2. No acute fracture or traumatic subluxation of the cervical spine. Electronically Signed   By: Ronney Asters M.D.   On: 03/19/2021 17:47   CT Cervical Spine Wo Contrast  Result Date: 03/19/2021 CLINICAL DATA:  Trauma, left-sided weakness.  Recent fall. EXAM: CT HEAD WITHOUT CONTRAST CT CERVICAL SPINE WITHOUT CONTRAST TECHNIQUE: Multidetector CT imaging of the head and cervical spine was performed following the standard protocol without intravenous contrast. Multiplanar CT image reconstructions of the cervical spine were also generated. COMPARISON:  CT head and cervical spine 03/17/2021. FINDINGS: CT HEAD FINDINGS Brain: There is a small area of deep white matter and cortical hypodensity in the right parietal  lobe image 3/18 which is new from prior worrisome for acute infarct. There is mild sulcal effacement without significant mass effect or midline shift. There is no acute intracranial hemorrhage or extra-axial fluid collection. There is no hydrocephalus. There is mild diffuse atrophy, unchanged. There is stable mild periventricular white matter hypodensity, likely chronic small vessel ischemic change. Vascular: No hyperdense vessel or unexpected calcification. Skull: Normal. Negative for fracture or focal lesion. Sinuses/Orbits: There is mucosal thickening of the right sphenoid sinus. Mastoid air cells are clear. Other: Left frontal scalp soft tissue swelling is again noted, decreased from prior. CT CERVICAL SPINE FINDINGS Alignment: There is stable 3 mm of anterolisthesis at C3-C4 which is likely degenerative. Alignment is otherwise anatomic. Skull base and vertebrae: No acute fracture. No primary bone lesion or focal pathologic process. Soft tissues and spinal canal: No prevertebral fluid or swelling. No visible canal hematoma. Disc levels: There is significant disc space narrowing and endplate osteophyte formation throughout the mid and lower cervical spine which is similar to the prior examination. There is also significant bilateral facet  joint degenerative change. Multilevel neural foraminal stenosis on the left appears similar to the prior examination. Mild central canal stenosis at C5-C6 is also similar to the prior examination. Upper chest: Negative. Other: None. IMPRESSION: 1. Small acute infarct in the right parietal lobe. No midline shift. No acute intracranial hemorrhage. 2. No acute fracture or traumatic subluxation of the cervical spine. Electronically Signed   By: Ronney Asters M.D.   On: 03/19/2021 17:47   MR ANGIO HEAD WO CONTRAST  Result Date: 03/19/2021 CLINICAL DATA:  Stroke follow-up EXAM: MRI HEAD WITHOUT CONTRAST MRA HEAD WITHOUT CONTRAST TECHNIQUE: Multiplanar, multi-echo pulse sequences  of the brain and surrounding structures were acquired without intravenous contrast. Angiographic images of the Circle of Willis were acquired using MRA technique without intravenous contrast. COMPARISON:  10/04/2020 brain MRI Head CT 03/19/2021 FINDINGS: MRI HEAD FINDINGS Brain: Medium-sized area of abnormal diffusion restriction within the right frontal operculum, anterior right parietal lobe and posterior right insula. Small area of diffusion restriction in the left temporal lobe. There is a thin left hemispheric subdural hematoma measuring 3 mm. There is multifocal hyperintense T2-weighted signal within the white matter. Generalized volume loss without a clear lobar predilection. Normal midline structures. Vascular: Major flow voids are preserved. Skull and upper cervical spine: Normal calvarium and skull base. Visualized upper cervical spine and soft tissues are normal. Sinuses/Orbits:No paranasal sinus fluid levels or advanced mucosal thickening. No mastoid or middle ear effusion. Normal orbits. MRA HEAD FINDINGS POSTERIOR CIRCULATION: --Vertebral arteries: Normal --Inferior cerebellar arteries: Normal. --Basilar artery: Normal. --Superior cerebellar arteries: Normal. --Posterior cerebral arteries: Normal. ANTERIOR CIRCULATION: --Intracranial internal carotid arteries: Normal. --Anterior cerebral arteries (ACA): Normal. --Middle cerebral arteries (MCA): Normal. ANATOMIC VARIANTS: None IMPRESSION: 1. Medium-sized area of acute ischemia within the right frontal operculum, anterior right parietal lobe and posterior right insula. 2. Small acute infarct of the left temporal lobe. 3. Normal intracranial MRA. 4. Thin left hemispheric subdural hematoma measuring 3 mm. These results were communicated to [place] at 9:46 pm on 03/19/2021 by text page via the [facility] messaging system. Electronically Signed   By: Ulyses Jarred M.D.   On: 03/19/2021 21:46   MR BRAIN WO CONTRAST  Result Date: 03/19/2021 CLINICAL DATA:   Stroke follow-up EXAM: MRI HEAD WITHOUT CONTRAST MRA HEAD WITHOUT CONTRAST TECHNIQUE: Multiplanar, multi-echo pulse sequences of the brain and surrounding structures were acquired without intravenous contrast. Angiographic images of the Circle of Willis were acquired using MRA technique without intravenous contrast. COMPARISON:  10/04/2020 brain MRI Head CT 03/19/2021 FINDINGS: MRI HEAD FINDINGS Brain: Medium-sized area of abnormal diffusion restriction within the right frontal operculum, anterior right parietal lobe and posterior right insula. Small area of diffusion restriction in the left temporal lobe. There is a thin left hemispheric subdural hematoma measuring 3 mm. There is multifocal hyperintense T2-weighted signal within the white matter. Generalized volume loss without a clear lobar predilection. Normal midline structures. Vascular: Major flow voids are preserved. Skull and upper cervical spine: Normal calvarium and skull base. Visualized upper cervical spine and soft tissues are normal. Sinuses/Orbits:No paranasal sinus fluid levels or advanced mucosal thickening. No mastoid or middle ear effusion. Normal orbits. MRA HEAD FINDINGS POSTERIOR CIRCULATION: --Vertebral arteries: Normal --Inferior cerebellar arteries: Normal. --Basilar artery: Normal. --Superior cerebellar arteries: Normal. --Posterior cerebral arteries: Normal. ANTERIOR CIRCULATION: --Intracranial internal carotid arteries: Normal. --Anterior cerebral arteries (ACA): Normal. --Middle cerebral arteries (MCA): Normal. ANATOMIC VARIANTS: None IMPRESSION: 1. Medium-sized area of acute ischemia within the right frontal operculum, anterior right parietal lobe and  posterior right insula. 2. Small acute infarct of the left temporal lobe. 3. Normal intracranial MRA. 4. Thin left hemispheric subdural hematoma measuring 3 mm. These results were communicated to [place] at 9:46 pm on 03/19/2021 by text page via the [facility] messaging system. Electronically  Signed   By: Ulyses Jarred M.D.   On: 03/19/2021 21:46     Transthoracic Echocardiogram  00/00/2021 Pending  Bilateral Carotid Dopplers  00/00/2021 Pending  ECG - SR rate 63 BPM. (See cardiology reading for complete details)   PHYSICAL EXAM Blood pressure (!) 119/44, pulse 68, temperature 98.2 F (36.8 C), temperature source Oral, resp. rate 18, height 5\' 4"  (1.626 m), weight 90.7 kg, SpO2 98 %.  Temp:  [98.2 F (36.8 C)] 98.2 F (36.8 C) (12/06 1513) Pulse Rate:  [50-81] 62 (12/07 1130) Resp:  [13-27] 20 (12/07 1130) BP: (94-142)/(37-80) 119/42 (12/07 1130) SpO2:  [84 %-100 %] 93 % (12/07 1130) Weight:  [90.7 kg] 90.7 kg (12/06 1509)  General - Well nourished, well developed pleasant elderly Caucasian lady, in no apparent distress.  Left periorbital ecchymosis.  Ophthalmologic - fundi not visualized due to noncooperation.  Cardiovascular - Regular rhythm and rate.  Mental Status -  Alert, oriented to person, she states that she is 87 years old. States "healthcare" as her location. She says that the year is "41". Recent memory is intact, patient is able to explain the events leading up to her hospital visit.  Patient does show a slight expressive aphasia and mild dysarthria.  She has difficulty with word finding.    Cranial Nerves II - XII - II -suspect a left visual field deficit.  She does not blink to threat on the left side.  Uncooperative with peripheral field testing. III, IV, VI - Extraocular movements intact. V -  Decreased sensation on left side VII - left facial asymmetry VIII - Hearing & vestibular intact bilaterally. X - Palate elevates symmetrically. XI - Chin turning & shoulder shrug intact bilaterally. XII - Tongue protrusion intact.  Motor Strength - Bulk was normal and fasciculations were absent.   Left arm is flaccid. 0/5  right upper and lower extremity strength is 5/5 Left lower extremity 3/5 with drift.  Motor Tone - Muscle tone was assessed at  the neck and appendages and was normal. Reflexes - The patient's reflexes were symmetrical in all extremities and she had no pathological reflexes. Sensory -diminished sensation on left upper and lower extremity.  She does recognize the left side of her body despite not feeling light touch on the left side Coordination - The patient had normal movements in the hands and feet with no ataxia or dysmetria.  Tremor was absent. Gait and Station - deferred.   ASSESSMENT/PLAN Ms. ROYLENE HEATON is a 87 y.o. female with history of hypertension, Hld, diabetes, chronic diastolic CHF, CKD, dementia, Bell's palsy, and breast cancer presenting with slurred speech, left-sided weakness, left facial droop and acute on chronic kidney injury. She did not receive IV t-PA due to late presentation (>4.5 hours from time of onset).  Stroke -  right parietal infarct with small acute infarct of the left temporal lobe - embolic - source unknown - Thin left hemispheric subdural hematoma.  Resultant   Code Stroke CT Head - not ordered CT head - Small acute infarct in the right parietal lobe. No midline shift. No acute intracranial hemorrhage MRI head - Medium-sized area of acute ischemia within the right frontal operculum, anterior right parietal lobe and  posterior right insula. Small acute infarct of the left temporal lobe. Thin left hemispheric subdural hematoma measuring 3 mm. MRA head - normal CTA H&N - not ordered CT Perfusion - not ordered Carotid Doppler - pending 2D Echo - LV EF 60-65%. No regional wall abnormalities [employer] Virus 2  - negative LDL - 53 HgbA1c - 7.2 UDS - negative VTE prophylaxis - [place] Heparin Diet  Diet Order             Diet Carb Modified Fluid consistency: Thin; Room service appropriate? Yes  Diet effective now                  aspirin 81 mg daily prior to admission, now on plavix 75 mg daily Patient will be counseled to be compliant with her antithrombotic medications Ongoing  aggressive stroke risk factor management Therapy recommendations:  pending Disposition:  Pending  Hypertension Home BP meds: Coreg Current BP meds: none  BP mildly low SBP goal < 140 mm Hg for the first 24 hours then reassess.   Long-term BP goal normotensive  Hyperlipidemia Home Lipid lowering medication: Vytorin 10/40 LDL 53, goal < 70 Current lipid lowering medication: Lipitor 80 mg daily  Continue statin at discharge  Diabetes Home diabetic meds: insulin Current diabetic meds: SSI  HgbA1c 7.2, goal < 7.0 Recent Labs    03/19/21 1539 03/20/21 0755  GLUCAP 105* 165*    Other Stroke Risk Factors Advanced age ETOH use, advised to drink no more than 1 alcoholic beverage per day. Obesity, Body mass index is 34.32 kg/m., recommend weight loss, diet and exercise as appropriate  Family hx stroke (mother)  Chronic diastolic CHF OSA - "supposed to wear mask but couldn't"  Other Active Problems, Findings, Recommendations and/or Plan Code status - DNR Recent E-coli UTI Dementia hx Gout continue alluprinol Anxiety/Depression Continue cymbalta Stage II sacral pressure injury RLL lung nodule Stable right lower lobe pulmonary nodules  Stage II melanoma Excision in July, margins clear CKD stage III - AKI - creatinine - 2.28->1.88    Hospital day # 0  Patient seen and examined by NP/APP with MD. MD to update note as needed.   Janine Ores, DNP, FNP-BC [facility] Pager: [phone]  STROKE MD NOTE : I have personally obtained history,examined this patient, reviewed notes, independently viewed imaging studies, participated in medical decision making and plan of care.ROS completed by me personally and pertinent positives fully documented  I have made any additions or clarifications directly to the above note. Agree with note above.  Patient presented with a fall 3 days ago nonfocal exam but subsequently developed slurred speech and left-sided weakness and  MRI scan shows right MCA branch as well as tiny asymptomatic left temporal embolic infarcts.  Recommend further evaluation with checking cardiac monitoring and possible loop recorder at discharge.  Recommend change aspirin to Plavix and would not do dual antiplatelet therapy due to subdural hemorrhage and orbital ecchymosis.  Continue ongoing stroke work-up and aggressive risk factor modification.  Greater than 50% time during this 35-minute visit was spent on counseling and coordination of care about her embolic stroke and discussion about stroke evaluation and treatment and answering questions.  Antony Contras, MD Medical Director [facility] Pager: [phone] 03/20/2021 2:46 PM   To contact Stroke Continuity provider, please refer to [email or web address] After hours, contact General Neurology

## 2021-03-20 NOTE — ED Notes (Signed)
Breakfast orders placed 

## 2021-03-21 DIAGNOSIS — I639 Cerebral infarction, unspecified: Secondary | ICD-10-CM | POA: Diagnosis not present

## 2021-03-21 LAB — CBC
HCT: 33 % — ABNORMAL LOW (ref 36.0–46.0)
Hemoglobin: 10.2 g/dL — ABNORMAL LOW (ref 12.0–15.0)
MCH: 29.6 pg (ref 26.0–34.0)
MCHC: 30.9 g/dL (ref 30.0–36.0)
MCV: 95.7 fL (ref 80.0–100.0)
Platelets: 183 10*3/uL (ref 150–400)
RBC: 3.45 MIL/uL — ABNORMAL LOW (ref 3.87–5.11)
RDW: 14.6 % (ref 11.5–15.5)
WBC: 9.7 10*3/uL (ref 4.0–10.5)
nRBC: 0 % (ref 0.0–0.2)

## 2021-03-21 LAB — BASIC METABOLIC PANEL
Anion gap: 7 (ref 5–15)
BUN: 42 mg/dL — ABNORMAL HIGH (ref 8–23)
CO2: 24 mmol/L (ref 22–32)
Calcium: 8.5 mg/dL — ABNORMAL LOW (ref 8.9–10.3)
Chloride: 105 mmol/L (ref 98–111)
Creatinine, Ser: 1.39 mg/dL — ABNORMAL HIGH (ref 0.44–1.00)
GFR, Estimated: 36 mL/min — ABNORMAL LOW (ref >60)
Glucose, Bld: 181 mg/dL — ABNORMAL HIGH (ref 70–99)
Potassium: 4.2 mmol/L (ref 3.5–5.1)
Sodium: 136 mmol/L (ref 135–145)

## 2021-03-21 LAB — GLUCOSE, CAPILLARY
Glucose-Capillary: 183 mg/dL — ABNORMAL HIGH (ref 70–99)
Glucose-Capillary: 189 mg/dL — ABNORMAL HIGH (ref 70–99)
Glucose-Capillary: 197 mg/dL — ABNORMAL HIGH (ref 70–99)
Glucose-Capillary: 221 mg/dL — ABNORMAL HIGH (ref 70–99)
Glucose-Capillary: 211 mg/dL — ABNORMAL HIGH (ref 70–99)

## 2021-03-21 LAB — URINALYSIS, ROUTINE W REFLEX MICROSCOPIC
Bilirubin Urine: NEGATIVE
Glucose, UA: NEGATIVE mg/dL
Hgb urine dipstick: NEGATIVE
Ketones, ur: NEGATIVE mg/dL
Leukocytes,Ua: NEGATIVE
Nitrite: NEGATIVE
Protein, ur: NEGATIVE mg/dL
Specific Gravity, Urine: 1.015 (ref 1.005–1.030)
pH: 7.5 (ref 5.0–8.0)

## 2021-03-21 LAB — MRSA NEXT GEN BY PCR, NASAL: MRSA by PCR Next Gen: NOT DETECTED

## 2021-03-21 MED ORDER — INSULIN GLARGINE-YFGN 100 UNIT/ML ~~LOC~~ SOLN
5.0000 [IU] | Freq: Every day | SUBCUTANEOUS | Status: DC
  Administered 2021-03-21 – 2021-03-24 (×4): 5 [IU] via SUBCUTANEOUS
  Filled 2021-03-21 (×4): qty 0.05

## 2021-03-21 MED ORDER — ORAL CARE MOUTH RINSE
15.0000 mL | Freq: Two times a day (BID) | OROMUCOSAL | Status: DC
  Administered 2021-03-21 – 2021-03-28 (×11): 15 mL via OROMUCOSAL

## 2021-03-21 MED ORDER — INSULIN GLARGINE-YFGN 100 UNIT/ML ~~LOC~~ SOLN: 5.0000 [IU] | Freq: Every day | SUBCUTANEOUS | Status: DC

## 2021-03-21 MED ORDER — CHLORHEXIDINE GLUCONATE 0.12 % MT SOLN
15.0000 mL | Freq: Two times a day (BID) | OROMUCOSAL | Status: DC
  Administered 2021-03-21 – 2021-03-28 (×13): 15 mL via OROMUCOSAL
  Filled 2021-03-21 (×13): qty 15

## 2021-03-21 MED ORDER — INSULIN GLARGINE-YFGN 100 UNIT/ML ~~LOC~~ SOLN
5.0000 [IU] | Freq: Once | SUBCUTANEOUS | Status: AC
  Administered 2021-03-21: 5 [IU] via SUBCUTANEOUS
  Filled 2021-03-21: qty 0.05

## 2021-03-21 NOTE — Progress Notes (Signed)
CSW attempted to contact daughter to discuss recommendation for SNF. Left a voicemail, awaiting call back.  Laveda Abbe, [place] Clinical Social Worker [phone]

## 2021-03-21 NOTE — Plan of Care (Signed)
°  Problem: Education: °Goal: Knowledge of disease or condition will improve °Outcome: Progressing °Goal: Knowledge of secondary prevention will improve (SELECT ALL) °Outcome: Progressing °Goal: Knowledge of patient specific risk factors will improve (INDIVIDUALIZE FOR PATIENT) °Outcome: Progressing °Goal: Individualized Educational Video(s) °Outcome: Progressing °  °Problem: Coping: °Goal: Will verbalize positive feelings about self °Outcome: Progressing °Goal: Will identify appropriate support needs °Outcome: Progressing °  °Problem: Health Behavior/Discharge Planning: °Goal: Ability to manage health-related needs will improve °Outcome: Progressing °  °Problem: Self-Care: °Goal: Ability to participate in self-care as condition permits will improve °Outcome: Progressing °Goal: Verbalization of feelings and concerns over difficulty with self-care will improve °Outcome: Progressing °Goal: Ability to communicate needs accurately will improve °Outcome: Progressing °  °Problem: Nutrition: °Goal: Risk of aspiration will decrease °Outcome: Progressing °Goal: Dietary intake will improve °Outcome: Progressing °  °Problem: Ischemic Stroke/TIA Tissue Perfusion: °Goal: Complications of ischemic stroke/TIA will be minimized °Outcome: Progressing °  °Problem: Education: °Goal: Knowledge of General Education information will improve °Description: Including pain rating scale, medication(s)/side effects and non-pharmacologic comfort measures °Outcome: Progressing °  °Problem: Health Behavior/Discharge Planning: °Goal: Ability to manage health-related needs will improve °Outcome: Progressing °  °Problem: Clinical Measurements: °Goal: Ability to maintain clinical measurements within normal limits will improve °Outcome: Progressing °Goal: Will remain free from infection °Outcome: Progressing °Goal: Diagnostic test results will improve °Outcome: Progressing °Goal: Respiratory complications will improve °Outcome: Progressing °Goal:  Cardiovascular complication will be avoided °Outcome: Progressing °  °Problem: Activity: °Goal: Risk for activity intolerance will decrease °Outcome: Progressing °  °Problem: Nutrition: °Goal: Adequate nutrition will be maintained °Outcome: Progressing °  °Problem: Coping: °Goal: Level of anxiety will decrease °Outcome: Progressing °  °Problem: Elimination: °Goal: Will not experience complications related to bowel motility °Outcome: Progressing °Goal: Will not experience complications related to urinary retention °Outcome: Progressing °  °Problem: Pain Managment: °Goal: General experience of comfort will improve °Outcome: Progressing °  °Problem: Safety: °Goal: Ability to remain free from injury will improve °Outcome: Progressing °  °Problem: Skin Integrity: °Goal: Risk for impaired skin integrity will decrease °Outcome: Progressing °  °

## 2021-03-21 NOTE — Progress Notes (Signed)
Physical Therapy Treatment Patient Details Name: Brandi Melton MRN: [id number] DOB: 07-27-1930 Today's Date: 03/21/2021   History of Present Illness Pt is an 87 y/o female admitted 12/6 secondary to L sided weakness. Imaging showed acute ischemia within the right frontal  operculum, anterior right parietal lobe and posterior right insula, and small infarct in L temporal lobe; L SDH. On 03/16/2021, she had an ED visit for increasing weakness and difficulty walking.  CT no acute abnormality.  Found to have UTI and discharged back to SNF.  03/17/2021, patient again had an ED visit after a fall in the nursing facility hitting her left side of head.  CT no acute abnormality.  Patient discharged back to nursing facility. PMH includes CKD, RA, DM, and dCHF.    PT Comments    Patient progressing with mobility able to sit EOB to work on balance while eating her lunch.  Still with L side neglect needing assist for L UE during session and drifting posterior at times leaning back needing assist for body spatial awareness.  She did not attempt to help reposition when performing scoot pivots to move closer to [facility].  She remains most appropriate for SNF level rehab at d/c.  PT to follow.    Recommendations for follow up therapy are one component of a multi-disciplinary discharge planning process, led by the attending physician.  Recommendations may be updated based on patient status, additional functional criteria and insurance authorization.  Follow Up Recommendations  Skilled nursing-short term rehab (<3 hours/day)     Assistance Recommended at Discharge Frequent or constant Supervision/Assistance  Equipment Recommendations  Wheelchair (measurements PT);Wheelchair cushion (measurements PT);Hospital bed;Other (comment)    Recommendations for Other Services       Precautions / Restrictions Precautions Precautions: Fall Precaution Comments: L neglect     Mobility  Bed Mobility Overal bed mobility: Needs  Assistance Bed Mobility: Rolling;Sidelying to Sit;Sit to Supine Rolling: Mod assist;+2 for physical assistance Sidelying to sit: HOB elevated;Max assist;+2 for physical assistance   Sit to supine: Max assist;+2 for physical assistance   General bed mobility comments: assist from sidelying to sit with pt able to help to roll with cues and assist for legs and trunk to upright    Transfers Overall transfer level: Needs assistance   Transfers: Bed to chair/wheelchair/BSC            Lateral/Scoot Transfers: +2 physical assistance;Total assist General transfer comment: assist to lateral scoot toward HOB along EOB for positioning    Ambulation/Gait                   Stairs             Wheelchair Mobility    Modified Rankin (Stroke Patients Only)       Balance Overall balance assessment: Needs assistance Sitting-balance support: Feet supported;Feet unsupported;No upper extremity supported Sitting balance-Leahy Scale: Poor Sitting balance - Comments: min to mod A for balance at EOB while eating lunch; no awareness on L UE and needed assist to keep from falling off her lap.  At times sitting forward and min to S for balance, at times falling back and R foot off the floor with heavy mod A for balance                                    Cognition Arousal/Alertness: Awake/alert Behavior During Therapy: WFL for tasks assessed/performed Overall Cognitive Status:  Impaired/Different from baseline Area of Impairment: Memory;Orientation;Safety/judgement;Following commands;Problem solving;Awareness;Attention                 Orientation Level: Disoriented to;Time;Situation Current Attention Level: Sustained Memory: Decreased short-term memory;Decreased recall of precautions Following Commands: Follows one step commands with increased time Safety/Judgement: Decreased awareness of deficits;Decreased awareness of safety   Problem Solving: Slow  processing;Decreased initiation;Difficulty sequencing General Comments: knew she fell, but did not recall until told about having a stroke        Exercises      General Comments        Pertinent Vitals/Pain Pain Assessment: Faces Faces Pain Scale: Hurts little more Pain Location: generalized with mobility R knee mainly Pain Descriptors / Indicators: Aching;Grimacing Pain Intervention(s): Monitored during session;Repositioned;Limited activity within patient's tolerance    Home Living                          Prior Function            PT Goals (current goals can now be found in the care plan section) Progress towards PT goals: Progressing toward goals    Frequency    Min 3X/week      PT Plan Current plan remains appropriate    Co-evaluation              AM-PAC PT "6 Clicks" Mobility   Outcome Measure  Help needed turning from your back to your side while in a flat bed without using bedrails?: A Lot Help needed moving from lying on your back to sitting on the side of a flat bed without using bedrails?: Total Help needed moving to and from a bed to a chair (including a wheelchair)?: Total Help needed standing up from a chair using your arms (e.g., wheelchair or bedside chair)?: Total Help needed to walk in hospital room?: Total Help needed climbing 3-5 steps with a railing? : Total 6 Click Score: 7    End of Session   Activity Tolerance: Patient tolerated treatment well Patient left: in bed;with call bell/phone within reach;with bed alarm set (in chair position)   PT Visit Diagnosis: Hemiplegia and hemiparesis;Other symptoms and signs involving the nervous system (R29.898);Other abnormalities of gait and mobility (R26.89) Hemiplegia - Right/Left: Left Hemiplegia - dominant/non-dominant: Non-dominant Hemiplegia - caused by: Cerebral infarction     Time: 2878-6767 PT Time Calculation (min) (ACUTE ONLY): 25 min  Charges:  $Therapeutic  Activity: 23-37 mins                     Magda Kiel, PT Acute Rehabilitation Services Pager:[phone] Office:[phone] 03/21/2021    [name] 03/21/2021, 2:09 PM

## 2021-03-21 NOTE — Progress Notes (Signed)
Called to pts room by daughter, Lovey Newcomer.  Lovey Newcomer concerned that pts conditioning worsening compared to yesterday.  RN re-assessed pt - no change from morning assessment.  MD notified.  MD to contact pts daughter.  Will continue to monitor.

## 2021-03-21 NOTE — Progress Notes (Addendum)
HD#1 Subjective:  Overnight Events: NAEO   Patient says that she is feeling ok this morning. Does not know where she is. Says that she has been falling.  Objective:  Vital signs in last 24 hours: Vitals:   03/20/21 1845 03/20/21 1953 03/20/21 2355 03/21/21 0331  BP: (!) 145/42 (!) 122/54 (!) 145/51 (!) 140/52  Pulse: 68 71 72 72  Resp: 17 18 17 18   Temp: 98 F (36.7 C) 99.1 F (37.3 C) 98.5 F (36.9 C) 98.6 F (37 C)  TempSrc: Oral Oral Oral Oral  SpO2:  95% 93% 94%  Weight:      Height:       Supplemental O2: Room Air SpO2: 94 %   Physical Exam:  Constitutional: Elderly woman resting comfortably in bed, in no acute distress HEENT: ecchymosis of the left orbit  Cardiovascular: regular rate and rhythm, no m/r/g Pulmonary/Chest: normal work of breathing on room air, lungs clear to auscultation bilaterally Abdominal: soft, non-tender Neurological: awake, oriented to self, difficulty concentrating, facial droop and mild dysarthria present, 0/5 strength in left upper extremity, 3/5 strength in the LLE to hip flexion, 0/5 on dorsi/plantarflexion. Loss of sensation on the left side, worse in the LUE than the LLE. Skin: warm and dry Psych: normal affect     Filed Weights   03/19/21 1509  Weight: 90.7 kg     Intake/Output Summary (Last 24 hours) at 03/21/2021 0623 Last data filed at 03/20/2021 1653 Gross per 24 hour  Intake --  Output 1000 ml  Net -1000 ml   Net IO Since Admission: -1,000 mL [03/21/21 0623]  Pertinent Labs: CBC Latest Ref Rng & Units 03/21/2021 03/20/2021 03/19/2021  WBC 4.0 - 10.5 K/uL 9.7 10.9(H) 11.5(H)  Hemoglobin 12.0 - 15.0 g/dL 10.2(L) 9.9(L) 12.1  Hematocrit 36.0 - 46.0 % 33.0(L) 33.5(L) 41.5  Platelets 150 - 400 K/uL 183 176 168    CMP Latest Ref Rng & Units 03/21/2021 03/20/2021 03/19/2021  Glucose 70 - 99 mg/dL 181(H) 167(H) 104(H)  BUN 8 - 23 mg/dL 42(H) 58(H) 62(H)  Creatinine 0.44 - 1.00 mg/dL 1.39(H) 1.88(H) 2.28(H)  Sodium 135 -  145 mmol/L 136 140 140  Potassium 3.5 - 5.1 mmol/L 4.2 4.6 5.1  Chloride 98 - 111 mmol/L 105 106 107  CO2 22 - 32 mmol/L 24 25 22   Calcium 8.9 - 10.3 mg/dL 8.5(L) 8.2(L) 8.9  Total Protein 6.5 - 8.1 g/dL - - 6.6  Total Bilirubin 0.3 - 1.2 mg/dL - - 0.4  Alkaline Phos 38 - 126 U/L - - 84  AST 15 - 41 U/L - - 35  ALT 0 - 44 U/L - - 26    Assessment/Plan:   Principal Problem:   Acute cerebral infarction ([place]) Active Problems:   Diabetes mellitus type 2, insulin dependent ([place])   Hypertension   Hyperlipemia   CKD (chronic kidney disease), stage IV ([facility])   AKI (acute kidney injury) ([place])   Subdural hematoma   CVA (cerebral vascular accident) [facility])   Patient Summary: Brandi Melton is a 87 y.o. with a pertinent PMH of type II DM, hyperlipidemia, hypertension, CKD stage III, gout and diastolic heart failure presenting with aphasia and code stroke.   Acute right parietal lobe infarct MRI brain showed acute ischemia within right frontal operculum, anterior right parietal lobe and posterior right insula.  Also a small acute infarct of the left temporal lobe. With the multifocal nature of these infarcts suspect embolic source could the cause.  Patient was starting to show symptoms on 12/4 per family though since they were out of town they don't have a great idea of the exact timeline. Given the significant deficits patient's ability to return to prior functional status will be challenging. Repeat CT as below showed expected evolution of acute frontoparietal and posterior insular infarct. Patient seems to be more confused today, could be a hospital delirium but cannot rule out that it is related to her CVA. Will continue to monitor. - Neurology following; appreciate their recommendations - plavix - continue atorvastatin 80 mg - Telemetry  - TTE  showed grade 1 diastolic dysfunction with an EF Of 60-65% -  Carotid vascular ultrasound showed mild stenosis - PT/OT eval and treat   Subdural  Hematoma Patient recently sustained multiple mechanical falls (12/3 and 12/4); patient was seen in the ED for both occurences. Imaging on admission 12/6 showed thin left hemispheric subdural hematoma measuring 3 mm on MRI of the brain. Repeat imaging on 12/7 showed grossly stable to minimally increased SDH up to 5 mm with no mass effect. However, was able to speak with neurosurgery. They looked at the imaging and feel that SDH is very minimal, likely not causing symptoms and no interventions needed at this time.  --Frequent neuro checks --Avoid DAPT in the setting of SDH   Type 2 DM Most recent hemoglobin A1c s 7.2.  Patient is on insulin aspart 20 mg 3 times daily with meals and insulin glargine 40 units twice daily.  Blood glucose in the low 100s since admission. -CBG monitoring -SSI   Hyperlipidemia LDL on lipid panel 53, previously on moderate intensity statin therapy which was increased to high intensity after admission. -- atorvastatin 80   Hypertension Diastolic heart failure Medications include lisinopril, Coreg, and torsemide. Holding in the setting of permissive hypertension. TTE on admission showed normal EF 83-09% grade 1 diastolic dysfunction. -Allow for permissive hypertension for 24-48 hours -Strict ins and outs -Daily weights   AKI on CKD stage IIIb; resolved Creatinine on admission 2.28, improved to 1.39 which is about at patient's baseline.Will need to monitor patient's ability to maintain good PO intake with new deficits. - daily bmp - Avoid nephrotoxic agents   Anemia of CKD Hemoglobin appears to be close to baseline of about 10 -- daily CBC   Stage II sacral pressure injury -Frequent repositioning and barrier cream   Gout -Continue allopurinol 100 mg daily   History of stage II melanoma Status post excision in July of this year.  Margins were clear on pathology review.   RLL lung nodule CT chest during last admission showed stable right lower lobe  pulmonary nodules and recommended follow-up noncontrast chest CT in about 6 months.  Largest nodule measuring up to 8 mm.   Anxiety/depression, chronic and stable -Continue Cymbalta  Scarlett Presto, MD Internal Medicine Resident PGY-1 Pager [phone] Please contact the on call pager after 5 pm and on weekends at [phone].

## 2021-03-21 NOTE — Progress Notes (Addendum)
STROKE TEAM PROGRESS NOTE  INTERVAL HISTORY No family at the bedside. Therapy is at the bedside getting her back into bed. She is awake and alert. Therapy is recommending SNF Vital signs are stable.  Neurological exam is unchanged.  OBJECTIVE Vitals:   03/20/21 2355 03/21/21 0331 03/21/21 0900 03/21/21 1139  BP: (!) 145/51 (!) 140/52 (!) 145/65 (!) 135/50  Pulse: 72 72 65 63  Resp: 17 18 14 14   Temp: 98.5 F (36.9 C) 98.6 F (37 C) 98.5 F (36.9 C) 98.4 F (36.9 C)  TempSrc: Oral Oral Oral Oral  SpO2: 93% 94% 98% 95%  Weight:      Height:        CBC:  Recent Labs  Lab 03/17/21 2016 03/19/21 1519 03/20/21 0300 03/21/21 0411  WBC 10.4 11.5* 10.9* 9.7  NEUTROABS 6.7 7.0  --   --   HGB 12.5 12.1 9.9* 10.2*  HCT 41.0 41.5 33.5* 33.0*  MCV 98.3 102.0* 99.4 95.7  PLT 199 168 176 183     Basic Metabolic Panel:  Recent Labs  Lab 03/20/21 0300 03/21/21 0411  NA 140 136  K 4.6 4.2  CL 106 105  CO2 25 24  GLUCOSE 167* 181*  BUN 58* 42*  CREATININE 1.88* 1.39*  CALCIUM 8.2* 8.5*     Lipid Panel:     Component Value Date/Time   CHOL 95 03/19/2021 2027   TRIG 87 03/19/2021 2027   HDL 25 (L) 03/19/2021 2027   CHOLHDL 3.8 03/19/2021 2027   VLDL 17 03/19/2021 2027   LDLCALC 53 03/19/2021 2027   HgbA1c:  Lab Results  Component Value Date   HGBA1C 7.2 (H) 03/19/2021   Urine Drug Screen:     Component Value Date/Time   LABOPIA NONE DETECTED 03/16/2021 1150   COCAINSCRNUR NONE DETECTED 03/16/2021 1150   LABBENZ NONE DETECTED 03/16/2021 1150   AMPHETMU NONE DETECTED 03/16/2021 1150   THCU NONE DETECTED 03/16/2021 1150   LABBARB NONE DETECTED 03/16/2021 1150    Alcohol Level     Component Value Date/Time   ETH <10 03/16/2021 1150    IMAGING   CT HEAD WO CONTRAST (5MM)  Result Date: 03/20/2021 CLINICAL DATA:  Stroke follow-up EXAM: CT HEAD WITHOUT CONTRAST TECHNIQUE: Contiguous axial images were obtained from the base of the skull through the vertex  without intravenous contrast. COMPARISON:  CT head 126226 5:17 p.m., MR head 03/19/2021 FINDINGS: Brain: Expected evolution of an acute right frontoparietal and posterior insular infarction. Patchy and confluent areas of decreased attenuation are noted throughout the deep and periventricular white matter of the cerebral hemispheres bilaterally, compatible with chronic microvascular ischemic disease. Otherwise no parenchymal hemorrhage. No mass lesion. Redemonstration of a likely subacute up to 5 mm left subdural hematoma along the left calvarial convexity. No mass effect or midline shift. No hydrocephalus. Basilar cisterns are patent. Vascular: No hyperdense vessel. Atherosclerotic calcifications are present within the cavernous internal carotid arteries. Skull: No acute fracture or focal lesion. Sinuses/Orbits: Sphenoid mucosal thickening.Paranasal sinuses and mastoid air cells are clear. The orbits are unremarkable. Other: Mild left frontal scalp edema. IMPRESSION: 1. Expected evolution of an acute right frontoparietal and posterior insular infarction. 2. Grossly stable to possibly minimally increased in size, likely subacute, up to 5 mm, left subdural hematoma. No associated mass effect. 3. No new intracranial abnormality. Electronically Signed   By: Iven Finn M.D.   On: 03/20/2021 15:19   CT Head Wo Contrast  Result Date: 03/19/2021 CLINICAL DATA:  Trauma,  left-sided weakness.  Recent fall. EXAM: CT HEAD WITHOUT CONTRAST CT CERVICAL SPINE WITHOUT CONTRAST TECHNIQUE: Multidetector CT imaging of the head and cervical spine was performed following the standard protocol without intravenous contrast. Multiplanar CT image reconstructions of the cervical spine were also generated. COMPARISON:  CT head and cervical spine 03/17/2021. FINDINGS: CT HEAD FINDINGS Brain: There is a small area of deep white matter and cortical hypodensity in the right parietal lobe image 3/18 which is new from prior worrisome for  acute infarct. There is mild sulcal effacement without significant mass effect or midline shift. There is no acute intracranial hemorrhage or extra-axial fluid collection. There is no hydrocephalus. There is mild diffuse atrophy, unchanged. There is stable mild periventricular white matter hypodensity, likely chronic small vessel ischemic change. Vascular: No hyperdense vessel or unexpected calcification. Skull: Normal. Negative for fracture or focal lesion. Sinuses/Orbits: There is mucosal thickening of the right sphenoid sinus. Mastoid air cells are clear. Other: Left frontal scalp soft tissue swelling is again noted, decreased from prior. CT CERVICAL SPINE FINDINGS Alignment: There is stable 3 mm of anterolisthesis at C3-C4 which is likely degenerative. Alignment is otherwise anatomic. Skull base and vertebrae: No acute fracture. No primary bone lesion or focal pathologic process. Soft tissues and spinal canal: No prevertebral fluid or swelling. No visible canal hematoma. Disc levels: There is significant disc space narrowing and endplate osteophyte formation throughout the mid and lower cervical spine which is similar to the prior examination. There is also significant bilateral facet joint degenerative change. Multilevel neural foraminal stenosis on the left appears similar to the prior examination. Mild central canal stenosis at C5-C6 is also similar to the prior examination. Upper chest: Negative. Other: None. IMPRESSION: 1. Small acute infarct in the right parietal lobe. No midline shift. No acute intracranial hemorrhage. 2. No acute fracture or traumatic subluxation of the cervical spine. Electronically Signed   By: Ronney Asters M.D.   On: 03/19/2021 17:47   CT Cervical Spine Wo Contrast  Result Date: 03/19/2021 CLINICAL DATA:  Trauma, left-sided weakness.  Recent fall. EXAM: CT HEAD WITHOUT CONTRAST CT CERVICAL SPINE WITHOUT CONTRAST TECHNIQUE: Multidetector CT imaging of the head and cervical spine  was performed following the standard protocol without intravenous contrast. Multiplanar CT image reconstructions of the cervical spine were also generated. COMPARISON:  CT head and cervical spine 03/17/2021. FINDINGS: CT HEAD FINDINGS Brain: There is a small area of deep white matter and cortical hypodensity in the right parietal lobe image 3/18 which is new from prior worrisome for acute infarct. There is mild sulcal effacement without significant mass effect or midline shift. There is no acute intracranial hemorrhage or extra-axial fluid collection. There is no hydrocephalus. There is mild diffuse atrophy, unchanged. There is stable mild periventricular white matter hypodensity, likely chronic small vessel ischemic change. Vascular: No hyperdense vessel or unexpected calcification. Skull: Normal. Negative for fracture or focal lesion. Sinuses/Orbits: There is mucosal thickening of the right sphenoid sinus. Mastoid air cells are clear. Other: Left frontal scalp soft tissue swelling is again noted, decreased from prior. CT CERVICAL SPINE FINDINGS Alignment: There is stable 3 mm of anterolisthesis at C3-C4 which is likely degenerative. Alignment is otherwise anatomic. Skull base and vertebrae: No acute fracture. No primary bone lesion or focal pathologic process. Soft tissues and spinal canal: No prevertebral fluid or swelling. No visible canal hematoma. Disc levels: There is significant disc space narrowing and endplate osteophyte formation throughout the mid and lower cervical spine which is similar to  the prior examination. There is also significant bilateral facet joint degenerative change. Multilevel neural foraminal stenosis on the left appears similar to the prior examination. Mild central canal stenosis at C5-C6 is also similar to the prior examination. Upper chest: Negative. Other: None. IMPRESSION: 1. Small acute infarct in the right parietal lobe. No midline shift. No acute intracranial hemorrhage. 2. No  acute fracture or traumatic subluxation of the cervical spine. Electronically Signed   By: Ronney Asters M.D.   On: 03/19/2021 17:47   MR ANGIO HEAD WO CONTRAST  Result Date: 03/19/2021 CLINICAL DATA:  Stroke follow-up EXAM: MRI HEAD WITHOUT CONTRAST MRA HEAD WITHOUT CONTRAST TECHNIQUE: Multiplanar, multi-echo pulse sequences of the brain and surrounding structures were acquired without intravenous contrast. Angiographic images of the Circle of Willis were acquired using MRA technique without intravenous contrast. COMPARISON:  10/04/2020 brain MRI Head CT 03/19/2021 FINDINGS: MRI HEAD FINDINGS Brain: Medium-sized area of abnormal diffusion restriction within the right frontal operculum, anterior right parietal lobe and posterior right insula. Small area of diffusion restriction in the left temporal lobe. There is a thin left hemispheric subdural hematoma measuring 3 mm. There is multifocal hyperintense T2-weighted signal within the white matter. Generalized volume loss without a clear lobar predilection. Normal midline structures. Vascular: Major flow voids are preserved. Skull and upper cervical spine: Normal calvarium and skull base. Visualized upper cervical spine and soft tissues are normal. Sinuses/Orbits:No paranasal sinus fluid levels or advanced mucosal thickening. No mastoid or middle ear effusion. Normal orbits. MRA HEAD FINDINGS POSTERIOR CIRCULATION: --Vertebral arteries: Normal --Inferior cerebellar arteries: Normal. --Basilar artery: Normal. --Superior cerebellar arteries: Normal. --Posterior cerebral arteries: Normal. ANTERIOR CIRCULATION: --Intracranial internal carotid arteries: Normal. --Anterior cerebral arteries (ACA): Normal. --Middle cerebral arteries (MCA): Normal. ANATOMIC VARIANTS: None IMPRESSION: 1. Medium-sized area of acute ischemia within the right frontal operculum, anterior right parietal lobe and posterior right insula. 2. Small acute infarct of the left temporal lobe. 3. Normal  intracranial MRA. 4. Thin left hemispheric subdural hematoma measuring 3 mm. These results were communicated to Graham at 9:46 pm on 03/19/2021 by text page via the [facility] messaging system. Electronically Signed   By: Ulyses Jarred M.D.   On: 03/19/2021 21:46   MR BRAIN WO CONTRAST  Result Date: 03/19/2021 CLINICAL DATA:  Stroke follow-up EXAM: MRI HEAD WITHOUT CONTRAST MRA HEAD WITHOUT CONTRAST TECHNIQUE: Multiplanar, multi-echo pulse sequences of the brain and surrounding structures were acquired without intravenous contrast. Angiographic images of the Circle of Willis were acquired using MRA technique without intravenous contrast. COMPARISON:  10/04/2020 brain MRI Head CT 03/19/2021 FINDINGS: MRI HEAD FINDINGS Brain: Medium-sized area of abnormal diffusion restriction within the right frontal operculum, anterior right parietal lobe and posterior right insula. Small area of diffusion restriction in the left temporal lobe. There is a thin left hemispheric subdural hematoma measuring 3 mm. There is multifocal hyperintense T2-weighted signal within the white matter. Generalized volume loss without a clear lobar predilection. Normal midline structures. Vascular: Major flow voids are preserved. Skull and upper cervical spine: Normal calvarium and skull base. Visualized upper cervical spine and soft tissues are normal. Sinuses/Orbits:No paranasal sinus fluid levels or advanced mucosal thickening. No mastoid or middle ear effusion. Normal orbits. MRA HEAD FINDINGS POSTERIOR CIRCULATION: --Vertebral arteries: Normal --Inferior cerebellar arteries: Normal. --Basilar artery: Normal. --Superior cerebellar arteries: Normal. --Posterior cerebral arteries: Normal. ANTERIOR CIRCULATION: --Intracranial internal carotid arteries: Normal. --Anterior cerebral arteries (ACA): Normal. --Middle cerebral arteries (MCA): Normal. ANATOMIC VARIANTS: None IMPRESSION: 1. Medium-sized area of acute ischemia within  the right frontal  operculum, anterior right parietal lobe and posterior right insula. 2. Small acute infarct of the left temporal lobe. 3. Normal intracranial MRA. 4. Thin left hemispheric subdural hematoma measuring 3 mm. These results were communicated to Jolly at 9:46 pm on 03/19/2021 by text page via the [facility] messaging system. Electronically Signed   By: Ulyses Jarred M.D.   On: 03/19/2021 21:46   ECHOCARDIOGRAM COMPLETE  Result Date: 03/20/2021    ECHOCARDIOGRAM REPORT   Patient Name:   Brandi Melton Date of Exam: 03/20/2021 Medical Rec #:  [id number]    Height:       64.0 in Accession #:    [id number]   Weight:       200.0 lb Date of Birth:  [birth year]    BSA:          1.956 m Patient Age:    7 years     BP:           134/48 mmHg Patient Gender: F            HR:           70 bpm. Exam Location:  Inpatient Procedure: 2D Echo Indications:    stroke  History:        Patient has prior history of Echocardiogram examinations, most                 recent 04/05/2017. Chronic kidney disease; Risk                 Factors:Diabetes, Hypertension and Dyslipidemia.  Sonographer:    Johny Chess RDCS Referring Phys: [phone] Luana  1. Left ventricular ejection fraction, by estimation, is 60 to 65%. The left ventricle has normal function. The left ventricle has no regional wall motion abnormalities. Left ventricular diastolic parameters are consistent with Grade I diastolic dysfunction (impaired relaxation). Elevated left atrial pressure.  2. Right ventricular systolic function is normal. The right ventricular size is normal.  3. Left atrial size was mildly dilated.  4. The mitral valve is abnormal. Trivial mitral valve regurgitation. No evidence of mitral stenosis. Severe mitral annular calcification.  5. The aortic valve is tricuspid. Aortic valve regurgitation is not visualized. Aortic valve sclerosis is present, with no evidence of aortic valve stenosis.  6. The inferior vena cava is normal in size with  greater than 50% respiratory variability, suggesting right atrial pressure of 3 mmHg. Comparison(s): Prior images unable to be directly viewed, comparison made by report only. FINDINGS  Left Ventricle: Left ventricular ejection fraction, by estimation, is 60 to 65%. The left ventricle has normal function. The left ventricle has no regional wall motion abnormalities. The left ventricular internal cavity size was normal in size. There is  no left ventricular hypertrophy. Left ventricular diastolic parameters are consistent with Grade I diastolic dysfunction (impaired relaxation). Elevated left atrial pressure. Right Ventricle: The right ventricular size is normal. No increase in right ventricular wall thickness. Right ventricular systolic function is normal. Left Atrium: Left atrial size was mildly dilated. Right Atrium: Right atrial size was normal in size. Pericardium: There is no evidence of pericardial effusion. Mitral Valve: The mitral valve is abnormal. Severe mitral annular calcification. Trivial mitral valve regurgitation. No evidence of mitral valve stenosis. Tricuspid Valve: The tricuspid valve is normal in structure. Tricuspid valve regurgitation is trivial. No evidence of tricuspid stenosis. Aortic Valve: The aortic valve is tricuspid. Aortic valve regurgitation is not visualized. Aortic valve sclerosis is  present, with no evidence of aortic valve stenosis. Pulmonic Valve: The pulmonic valve was normal in structure. Pulmonic valve regurgitation is trivial. No evidence of pulmonic stenosis. Aorta: The aortic root is normal in size and structure. Venous: The inferior vena cava is normal in size with greater than 50% respiratory variability, suggesting right atrial pressure of 3 mmHg. IAS/Shunts: No atrial level shunt detected by color flow Doppler.  LEFT VENTRICLE PLAX 2D LVIDd:         4.30 cm     Diastology LVIDs:         2.50 cm     LV e' medial:    5.77 cm/s LV PW:         1.00 cm     LV E/e' medial:   23.9 LV IVS:        1.00 cm     LV e' lateral:   6.31 cm/s LVOT diam:     1.90 cm     LV E/e' lateral: 21.9 LV SV:         71 LV SV Index:   36 LVOT Area:     2.84 cm  LV Volumes (MOD) LV vol d, MOD A4C: 80.7 ml LV vol s, MOD A4C: 25.7 ml LV SV MOD A4C:     80.7 ml RIGHT VENTRICLE             IVC RV S prime:     10.80 cm/s  IVC diam: 1.70 cm TAPSE (M-mode): 2.3 cm LEFT ATRIUM             Index        RIGHT ATRIUM           Index LA diam:        3.90 cm 1.99 cm/m   RA Area:     13.40 cm LA Vol (A2C):   81.0 ml 41.41 ml/m  RA Volume:   31.40 ml  16.05 ml/m LA Vol (A4C):   52.4 ml 26.79 ml/m LA Biplane Vol: 65.1 ml 33.28 ml/m  AORTIC VALVE LVOT Vmax:   112.00 cm/s LVOT Vmean:  80.100 cm/s LVOT VTI:    0.250 m  AORTA Ao Root diam: 3.10 cm Ao Asc diam:  3.10 cm MITRAL VALVE MV Area (PHT): 3.08 cm     SHUNTS MV Decel Time: 246 msec     Systemic VTI:  0.25 m MV E velocity: 138.00 cm/s  Systemic Diam: 1.90 cm MV A velocity: 179.00 cm/s MV E/A ratio:  0.77 Kirk Ruths MD Electronically signed by Kirk Ruths MD Signature Date/Time: 03/20/2021/12:00:29 PM    Final    VAS US CAROTID (at [facility] and [facility] only)  Result Date: 03/21/2021 Carotid Arterial Duplex Study Patient Name:  MELLISA ARSHAD  Date of Exam:   03/20/2021 Medical Rec #: [id number]     Accession #:    [id number] Date of Birth: 1930/11/10     Patient Gender: F Patient Age:   87 years Exam Location:  [facility] Procedure:      VAS US CAROTID Referring Phys: Rosalin Hawking --------------------------------------------------------------------------------  Indications:       CVA. Risk Factors:      Hypertension, hyperlipidemia, Diabetes. Limitations        Today's exam was limited due to the patient's inability or                    unwillingness to cooperate and the body habitus of the  patient. Comparison Study:  No prior study Performing Technologist: Maudry Mayhew MHA, RDMS, RVT, RDCS  Examination Guidelines: A complete evaluation  includes B-mode imaging, spectral Doppler, color Doppler, and power Doppler as needed of all accessible portions of each vessel. Bilateral testing is considered an integral part of a complete examination. Limited examinations for reoccurring indications may be performed as noted.  Right Carotid Findings: +----------+--------+--------+--------+------------------+---------+           PSV cm/sEDV cm/sStenosisPlaque DescriptionComments  +----------+--------+--------+--------+------------------+---------+ CCA Prox  84      9               heterogenous                +----------+--------+--------+--------+------------------+---------+ CCA Distal98      11                                          +----------+--------+--------+--------+------------------+---------+ ICA Prox  123     11              calcific          Shadowing +----------+--------+--------+--------+------------------+---------+ ICA Distal140     18                                          +----------+--------+--------+--------+------------------+---------+ ECA       182                                                 +----------+--------+--------+--------+------------------+---------+ +----------+--------+-------+----------------+-------------------+           PSV cm/sEDV cmsDescribe        Arm Pressure (mmHG) +----------+--------+-------+----------------+-------------------+ Subclavian160            Multiphasic, WNL                    +----------+--------+-------+----------------+-------------------+ +---------+--------+--+--------+--+---------+ VertebralPSV cm/s72EDV cm/s10Antegrade +---------+--------+--+--------+--+---------+  Left Carotid Findings: +----------+--------+--------+--------+-------------------------+---------+           PSV cm/sEDV cm/sStenosisPlaque Description       Comments  +----------+--------+--------+--------+-------------------------+---------+ CCA Prox  133      18              smooth and heterogenous            +----------+--------+--------+--------+-------------------------+---------+ CCA Distal88      9               heterogenous and calcificShadowing +----------+--------+--------+--------+-------------------------+---------+ ICA Prox  99      16              hyperechoic and calcific Shadowing +----------+--------+--------+--------+-------------------------+---------+ ICA Distal123     22                                                 +----------+--------+--------+--------+-------------------------+---------+ ECA       117     5               hyperechoic and calcific shadowing +----------+--------+--------+--------+-------------------------+---------+ +----------+--------+--------+----------------+-------------------+           PSV cm/sEDV cm/sDescribe  Arm Pressure (mmHG) +----------+--------+--------+----------------+-------------------+ [id number]             Multiphasic, WNL                    +----------+--------+--------+----------------+-------------------+ +---------+--------+--+--------+--+---------+ VertebralPSV cm/s86EDV cm/s16Antegrade +---------+--------+--+--------+--+---------+   Summary: Right Carotid: Velocities in the right ICA are consistent with a 1-39% stenosis. Left Carotid: Velocities in the left ICA are consistent with a 1-39% stenosis. Vertebrals:  Bilateral vertebral arteries demonstrate antegrade flow. Subclavians: Normal flow hemodynamics were seen in bilateral subclavian              arteries. *See table(s) above for measurements and observations.  Electronically signed by Antony Contras MD on 03/21/2021 at 1:59:46 PM.    Final      Transthoracic Echocardiogram   1. Left ventricular ejection fraction, by estimation, is 60 to 65%. The left ventricle has normal function. The left ventricle has no regional wall motion abnormalities. Left ventricular diastolic parameters are   consistent with Grade I diastolic dysfunction (impaired relaxation). Elevated left atrial pressure.   2. Right ventricular systolic function is normal. The right ventricular size is normal.   3. Left atrial size was mildly dilated.   4. The mitral valve is abnormal. Trivial mitral valve regurgitation. No evidence of mitral stenosis. Severe mitral annular calcification.   5. The aortic valve is tricuspid. Aortic valve regurgitation is not visualized. Aortic valve sclerosis is present, with no evidence of aortic valve stenosis.   6. The inferior vena cava is normal in size with greater than 50% respiratory variability, suggesting right atrial pressure of 3 mmHg.   Bilateral Carotid Dopplers  Right Carotid: Velocities in the right ICA are consistent with a 1-39% stenosis.   Left Carotid: Velocities in the left ICA are consistent with a 1-39% stenosis.   Vertebrals:  Bilateral vertebral arteries demonstrate antegrade flow.  Subclavians: Normal flow hemodynamics were seen in bilateral subclavian arteries.   ECG - SR rate 63 BPM. (See cardiology reading for complete details)   PHYSICAL EXAM Blood pressure (!) 135/50, pulse 63, temperature 98.4 F (36.9 C), temperature source Oral, resp. rate 14, height 5\' 4"  (1.626 m), weight 90.7 kg, SpO2 95 %.  Temp:  [98 F (36.7 C)-99.1 F (37.3 C)] 98.4 F (36.9 C) (12/08 1139) Pulse Rate:  [63-72] 63 (12/08 1139) Resp:  [14-18] 14 (12/08 1139) BP: (122-145)/(42-65) 135/50 (12/08 1139) SpO2:  [92 %-98 %] 95 % (12/08 1139)  General - Well nourished, well developed pleasant elderly Caucasian lady, in no apparent distress.  Left periorbital ecchymosis.  Ophthalmologic - fundi not visualized due to noncooperation.  Cardiovascular - Regular rhythm and rate.  Mental Status -  Alert, oriented to person, and age. Expressive aphasia present and dysarthria   Cranial Nerves II - XII - II -suspect a left visual field deficit.  She does not blink to  threat on the left side.  Uncooperative with peripheral field testing. III, IV, VI - Extraocular movements intact. V -  Decreased sensation on left side VII - left facial asymmetry VIII - Hearing & vestibular intact bilaterally. X - Palate elevates symmetrically. XI - Chin turning & shoulder shrug intact bilaterally. XII - Tongue protrusion intact.  Motor Strength - Bulk was normal and fasciculations were absent.   Left arm is flaccid. 0/5  right upper and lower extremity strength is 5/5 Left lower extremity 3/5 with drift.  Motor Tone - Muscle tone was assessed at the neck and appendages and was normal.  Sensory -diminished sensation on left upper and lower extremity.  She does recognize the left side of her body despite not feeling light touch on the left side Coordination - The patient had normal movements in the hands and feet with no ataxia or dysmetria.  Tremor was absent. Gait and Station - deferred.   ASSESSMENT/PLAN Brandi Melton is a 87 y.o. female with history of hypertension, Hld, diabetes, chronic diastolic CHF, CKD, dementia, Bell's palsy, and breast cancer presenting with slurred speech, left-sided weakness, left facial droop and acute on chronic kidney injury. She did not receive IV t-PA due to late presentation (>4.5 hours from time of onset).  Stroke -  right parietal infarct with small acute infarct of the left temporal lobe - embolic - source unknown - Thin left hemispheric subdural hematoma.   CT head - Small acute infarct in the right parietal lobe. No midline shift. No acute intracranial hemorrhage MRI head - Medium-sized area of acute ischemia within the right frontal operculum, anterior right parietal lobe and posterior right insula. Small acute infarct of the left temporal lobe. Thin left hemispheric subdural hematoma measuring 3 mm. MRA head - normal  Carotid Doppler -  Right Carotid: Velocities in the right ICA are consistent with a 1-39% stenosis.    Left Carotid: Velocities in the left ICA are consistent with a 1-39% stenosis.   Vertebrals:  Bilateral vertebral arteries demonstrate antegrade flow.  Subclavians: Normal flow hemodynamics were seen in bilateral subclavian arteries.   2D Echo - LV EF 60-65%. No regional wall abnormalities [employer] Virus 2  - negative LDL - 53 HgbA1c - 7.2 UDS - negative VTE prophylaxis - [place] Heparin Diet  Diet Order             Diet Carb Modified Fluid consistency: Thin; Room service appropriate? Yes  Diet effective now                  aspirin 81 mg daily prior to admission, now on plavix 75 mg daily Patient will be counseled to be compliant with her antithrombotic medications Ongoing aggressive stroke risk factor management Therapy recommendations: SNF Disposition:  Pending  Hypertension Home BP meds: Coreg Current BP meds: none  Long-term BP goal normotensive  Hyperlipidemia Home Lipid lowering medication: Vytorin 10/40 LDL 53, goal < 70 Current lipid lowering medication: Lipitor 80 mg daily  Continue statin at discharge  Diabetes Home diabetic meds: insulin Current diabetic meds: SSI  HgbA1c 7.2, goal < 7.0 Recent Labs    03/21/21 0655 03/21/21 0756 03/21/21 1201  GLUCAP 183* 189* 197*     Other Stroke Risk Factors Advanced age ETOH use, advised to drink no more than 1 alcoholic beverage per day. Obesity, Body mass index is 34.32 kg/m., recommend weight loss, diet and exercise as appropriate  Family hx stroke (mother)  Chronic diastolic CHF OSA - "supposed to wear mask but couldn't"  Other Active Problems, Findings, Recommendations and/or Plan Code status - DNR Recent E-coli UTI Dementia hx Gout continue alluprinol Anxiety/Depression Continue cymbalta Stage II sacral pressure injury RLL lung nodule Stable right lower lobe pulmonary nodules  Stage II melanoma Excision in July, margins clear CKD stage III - AKI - creatinine -  2.28->1.88->1.39   Neurology will signoff, please call with questions or concerns  Hospital day # 1  Patient seen and examined by NP/APP with MD. MD to update note as needed.  Beulah Gandy, NP   I have personally obtained history,examined this patient,  reviewed notes, independently viewed imaging studies, participated in medical decision making and plan of care.ROS completed by me personally and pertinent positives fully documented  I have made any additions or clarifications directly to the above note. Agree with note above.  Recommend change aspirin to Plavix for stroke prevention and maintain aggressive risk factor modification.  Stroke team will sign off.  Kindly call for questions.  Greater than 50% time during this 25-minute visit was spent in counseling coordination of care and discussion with care team and answering questions.  Antony Contras, MD Medical Director [facility] Pager: [phone] 03/21/2021 4:26 PM  To contact Stroke Continuity provider, please refer to [email or web address] After hours, contact General Neurology

## 2021-03-21 NOTE — Progress Notes (Signed)
Pt screaming help me and saying she needs to go home.  Unable to orient.  Removing equipment/clothes.  Attempted to set up pt for dinner.  Pt yelled at RN and NT stating she doesn't want to eat.  MD notified.  Will continue to monitor.

## 2021-03-21 NOTE — Progress Notes (Signed)
Inpatient Diabetes Program Recommendations  AACE/ADA: New Consensus Statement on Inpatient Glycemic Control (2015)  Target Ranges:  Prepandial:   less than 140 mg/dL      Peak postprandial:   less than 180 mg/dL (1-2 hours)      Critically ill patients:  140 - 180 mg/dL   Lab Results  Component Value Date   GLUCAP 189 (H) 03/21/2021   HGBA1C 7.2 (H) 03/19/2021    Review of Glycemic Control  Latest Reference Range & Units 03/20/21 07:55 03/20/21 12:05 03/20/21 16:25 03/21/21 06:55 03/21/21 07:56  Glucose-Capillary 70 - 99 mg/dL 165 (H) 270 (H) 263 (H) 183 (H) 189 (H)  Diabetes history:  DM 2 Outpatient Diabetes medications:  Lantus 40 units bid, Novolog 20 units tid with meals Current orders for Inpatient glycemic control:  Novolog moderate tid with meals Semglee 5 units daily  Inpatient Diabetes Program Recommendations:   Note patient was on substantial doses of insulin prior to admit. Semglee 5 units added today.  May need to titrate basal insulin based on fasting blood sugars.  Patient also may benefit from low dose meal coverage Novolog.   Thanks,  Adah Perl, RN, BC-ADM Inpatient Diabetes Coordinator Pager [phone]  (8a-5p)

## 2021-03-22 DIAGNOSIS — I639 Cerebral infarction, unspecified: Secondary | ICD-10-CM | POA: Diagnosis not present

## 2021-03-22 LAB — CBC
HCT: 34.3 % — ABNORMAL LOW (ref 36.0–46.0)
Hemoglobin: 10.5 g/dL — ABNORMAL LOW (ref 12.0–15.0)
MCH: 29.2 pg (ref 26.0–34.0)
MCHC: 30.6 g/dL (ref 30.0–36.0)
MCV: 95.5 fL (ref 80.0–100.0)
Platelets: 184 10*3/uL (ref 150–400)
RBC: 3.59 MIL/uL — ABNORMAL LOW (ref 3.87–5.11)
RDW: 14.6 % (ref 11.5–15.5)
WBC: 9.1 10*3/uL (ref 4.0–10.5)
nRBC: 0 % (ref 0.0–0.2)

## 2021-03-22 LAB — GLUCOSE, CAPILLARY
Glucose-Capillary: 189 mg/dL — ABNORMAL HIGH (ref 70–99)
Glucose-Capillary: 164 mg/dL — ABNORMAL HIGH (ref 70–99)
Glucose-Capillary: 237 mg/dL — ABNORMAL HIGH (ref 70–99)
Glucose-Capillary: 269 mg/dL — ABNORMAL HIGH (ref 70–99)

## 2021-03-22 LAB — BASIC METABOLIC PANEL
Anion gap: 10 (ref 5–15)
BUN: 34 mg/dL — ABNORMAL HIGH (ref 8–23)
CO2: 25 mmol/L (ref 22–32)
Calcium: 8.5 mg/dL — ABNORMAL LOW (ref 8.9–10.3)
Chloride: 105 mmol/L (ref 98–111)
Creatinine, Ser: 1.46 mg/dL — ABNORMAL HIGH (ref 0.44–1.00)
GFR, Estimated: 34 mL/min — ABNORMAL LOW (ref >60)
Glucose, Bld: 174 mg/dL — ABNORMAL HIGH (ref 70–99)
Potassium: 4.3 mmol/L (ref 3.5–5.1)
Sodium: 140 mmol/L (ref 135–145)

## 2021-03-22 MED ORDER — LISINOPRIL 2.5 MG PO TABS
5.0000 mg | ORAL_TABLET | Freq: Every day | ORAL | Status: DC
  Administered 2021-03-22 – 2021-03-28 (×6): 5 mg via ORAL
  Filled 2021-03-22 (×7): qty 2

## 2021-03-22 NOTE — Progress Notes (Signed)
HD#2 Subjective:  Overnight Events: disoriented, required a sitter briefly in the PM but was able to discontinue this overnight   Patient said that breakfast was pretty good today. She has some girlscout cookies at bedside that she says her daughter brought. She hope to be able to move her arm more.  Objective:  Vital signs in last 24 hours: Vitals:   03/21/21 1951 03/21/21 2340 03/22/21 0347 03/22/21 0807  BP: (!) 127/51 133/71 (!) 146/53 (!) 118/40  Pulse: 66 73 72 75  Resp: 17 20 20 14   Temp: 98.6 F (37 C) 98.3 F (36.8 C) 98.8 F (37.1 C)   TempSrc: Oral Oral Oral   SpO2: 92% 97% 96% (!) 88%  Weight:      Height:       Supplemental O2: Room Air SpO2: (!) 88 %   Physical Exam:  Constitutional: Elderly woman resting in bed comfortably in NAD HENT: unchanged ecchymosis over left eye CV: RRR no m/r/g Pulmonary/Chest: normal work of breathing on room air Abdominal: soft, non-tender, non-distended MSK: normal bulk and tone Neurological: alert, oriented to self, answering questions appropriately, 4/5 strength on hip flexion, 0/5 on dorsi/plantarflexion, able to laterally move left arm via deltoid but 0/5 on hand grip and forearm muscles, impaired sensation on the left. Skin: warm and dry Psych: normal affect  Filed Weights   03/19/21 1509  Weight: 90.7 kg     Intake/Output Summary (Last 24 hours) at 03/22/2021 1144 Last data filed at 03/21/2021 2000 Gross per 24 hour  Intake 640 ml  Output 850 ml  Net -210 ml   Net IO Since Admission: -730 mL [03/22/21 1144]  Pertinent Labs: CBC Latest Ref Rng & Units 03/22/2021 03/21/2021 03/20/2021  WBC 4.0 - 10.5 K/uL 9.1 9.7 10.9(H)  Hemoglobin 12.0 - 15.0 g/dL 10.5(L) 10.2(L) 9.9(L)  Hematocrit 36.0 - 46.0 % 34.3(L) 33.0(L) 33.5(L)  Platelets 150 - 400 K/uL 184 183 176    CMP Latest Ref Rng & Units 03/22/2021 03/21/2021 03/20/2021  Glucose 70 - 99 mg/dL 174(H) 181(H) 167(H)  BUN 8 - 23 mg/dL 34(H) 42(H) 58(H)   Creatinine 0.44 - 1.00 mg/dL 1.46(H) 1.39(H) 1.88(H)  Sodium 135 - 145 mmol/L 140 136 140  Potassium 3.5 - 5.1 mmol/L 4.3 4.2 4.6  Chloride 98 - 111 mmol/L 105 105 106  CO2 22 - 32 mmol/L 25 24 25   Calcium 8.9 - 10.3 mg/dL 8.5(L) 8.5(L) 8.2(L)  Total Protein 6.5 - 8.1 g/dL - - -  Total Bilirubin 0.3 - 1.2 mg/dL - - -  Alkaline Phos 38 - 126 U/L - - -  AST 15 - 41 U/L - - -  ALT 0 - 44 U/L - - -    Imaging: No results found.  Assessment/Plan:   Principal Problem:   Acute cerebral infarction [facility]) Active Problems:   Diabetes mellitus type 2, insulin dependent ([place])   Hypertension   Hyperlipemia   CKD (chronic kidney disease), stage IV (HCC)   AKI (acute kidney injury) ([place])   Subdural hematoma   CVA (cerebral vascular accident) [facility])   Patient Summary: Brandi Melton is a 87 y.o. with a pertinent PMH of type II DM, hyperlipidemia, hypertension, CKD stage III, gout and diastolic heart failure presenting with aphasia and code stroke.   Acute right parietal lobe infarct [facility] MRI brain showed acute ischemia within right frontal operculum, anterior right parietal lobe and posterior right insula.  Also a small acute infarct of the left  temporal lobe. With the multifocal nature of these infarcts suspect embolic source could the cause. Patient was starting to show symptoms on 12/4 per family though since they were out of town they don't have a great idea of the exact timeline. Given the significant deficits patient's ability to return to prior functional status will be challenging. Repeat CT as below showed expected evolution of acute frontoparietal and posterior insular infarct. Patient has been intermittently confused requiring a sitter briefly. Will discuss reorientation/bedside company with the family today. She was more alert and less confused this morning than yesterday morning. - Neurology signed off - plavix 75 daily -- restart home lisinopril 5 - continue  atorvastatin 80 mg - Telemetry: no Afib seen - TTE  showed grade 1 diastolic dysfunction with an EF Of 60-65% -  Carotid vascular ultrasound showed mild stenosis - plan for SNF   Subdural Hematoma Patient recently sustained multiple mechanical falls (12/3 and 12/4); patient was seen in the ED for both occurences. Imaging on admission 12/6 showed thin left hemispheric subdural hematoma measuring 3 mm on MRI of the brain. Repeat imaging on 12/7 showed grossly stable to minimally increased SDH up to 5 mm with no mass effect. However, was able to speak with neurosurgery. They looked at the imaging and feel that SDH is very minimal, likely not causing symptoms and no interventions needed at this time.  --Frequent neuro checks --Avoid DAPT in the setting of SDH   Type 2 DM Most recent hemoglobin A1c s 7.2.  Patient is on insulin aspart 20 mg 3 times daily with meals and insulin glargine 40 units twice daily.  Blood glucose in the low 100s since admission. -CBG monitoring -SSI   Hyperlipidemia LDL on lipid panel 53, previously on moderate intensity statin therapy which was increased to high intensity after admission. -- atorvastatin 80   Hypertension Diastolic heart failure Medications include lisinopril, Coreg, and torsemide. Holding in the setting of permissive hypertension. TTE on admission showed normal EF 15-17% grade 1 diastolic dysfunction. -restart lisinopril 5 -Strict ins and outs -Daily weights   AKI on CKD stage IIIb; resolved Creatinine on admission 2.28, improved to 1.39 which is about at patient's baseline.Will need to monitor patient's ability to maintain good PO intake with new deficits. - daily bmp - Avoid nephrotoxic agents   Anemia of CKD Hemoglobin appears to be close to baseline of about 10 -- daily CBC   Stage II sacral pressure injury -Frequent repositioning and barrier cream   Gout -Continue allopurinol 100 mg daily   History of stage II melanoma Status  post excision in July of this year.  Margins were clear on pathology review.   RLL lung nodule CT chest during last admission showed stable right lower lobe pulmonary nodules and recommended follow-up noncontrast chest CT in about 6 months.  Largest nodule measuring up to 8 mm.   Anxiety/depression, chronic and stable -Continue Cymbalta  Scarlett Presto, MD Internal Medicine Resident PGY-1 Pager [phone] Please contact the on call pager after 5 pm and on weekends at [phone].

## 2021-03-22 NOTE — Care Management Important Message (Signed)
Important Message  Patient Details  Name: Brandi Melton MRN: [id number] Date of Birth: 1930-07-07   Medicare Important Message Given:  Yes     Orbie Pyo 03/22/2021, 1:37 PM

## 2021-03-22 NOTE — TOC Initial Note (Addendum)
Transition of Care [facility]) - Initial/Assessment Note    Patient Details  Name: Brandi Melton MRN: [id number] Date of Birth: 03-04-1931  Transition of Care [facility]) CM/SW Contact:    Coralee Pesa, [place] Phone Number: 03/22/2021, 11:13 AM  Clinical Narrative:                 CSW noted that pt is disoriented at this time, and met with pt's daughter [name] and Son [place]. Family was advised of the PT recommendation for SNF, and were agreeable. Per family, pt has been to [facility] in the past, and is currently living at [facility] in the ALF. They would like for pt to improve enough to return to this level of care. They are unsure of pt's covid vaccination status, but note that ALF likely has those records, and that she had received some.  CSW notified family of the barriers with SNF and insurance, and they will be provided a Medicare list with the facilities that accept [employer]. All questions were answered at this time. CSW will complete workup and fax out referrals. TOC will continue to follow for further DC needs.  PASSR is Level 2 , all necessary documentation sent in.  Expected Discharge Plan: Skilled Nursing Facility Barriers to Discharge: Ship broker, Continued Medical Work up, SNF Pending bed offer   Patient Goals and CMS Choice Patient states their goals for this hospitalization and ongoing recovery are:: Pt is disoriented at this time and unable to participate in goal setting. CMS Medicare.gov Compare Post Acute Care list provided to:: Patient Represenative (must comment) (Daughter)    Expected Discharge Plan and Services Expected Discharge Plan: [place] In-house Referral: Clinical Social Work   Post Acute Care Choice: [place] Living arrangements for the past 2 months: [place]                                      Prior Living Arrangements/Services Living arrangements for the past 2 months: [place] Lives with:: Facility Resident Patient language and need for interpreter reviewed:: Yes Do you feel safe going back to the place where you live?: Yes      Need for Family Participation in Patient Care: Yes (Comment) Care giver support system in place?: Yes (comment)   Criminal Activity/Legal Involvement Pertinent to Current Situation/Hospitalization: No - Comment as needed  Activities of Daily Living Home Assistive Devices/Equipment: Environmental consultant (specify type) ADL Screening (condition at time of admission) Patient's cognitive ability adequate to safely complete daily activities?: No Is the patient deaf or have difficulty hearing?: No Does the patient have difficulty seeing, even when wearing glasses/contacts?: Yes Does the patient have difficulty concentrating, remembering, or making decisions?: Yes Patient able to express need for assistance with ADLs?: Yes Does the patient have difficulty dressing or bathing?: Yes Independently performs ADLs?: No Communication: Independent Dressing (OT): Dependent Is this a change from baseline?: Change from baseline, expected to last >3 days Grooming: Dependent Feeding: Needs assistance Walks in Home: Needs assistance Does the patient have difficulty walking or climbing stairs?: Yes Weakness of Legs: Both Weakness of Arms/Hands: Both  Permission Sought/Granted Permission sought to share information with : Family Supports Permission granted to share information with : Yes, Verbal Permission Granted  Share Information with NAME: [name]     Permission granted to share info w Relationship: Daughter  Permission granted to share  info w Contact Information: [phone]  Emotional Assessment Appearance:: Appears stated age Attitude/Demeanor/Rapport: Unable to Assess Affect (typically observed): Unable to Assess Orientation: : Oriented to Self Alcohol / Substance Use: Not Applicable Psych Involvement: No (comment)  Admission  diagnosis:  Slurred speech [R47.81] CVA (cerebral vascular accident) ([place]) [I63.9] Stroke [facility]) [I63.9] Patient Active Problem List   Diagnosis Date Noted   Subdural hematoma 03/20/2021   CVA (cerebral vascular accident) ([place]) 03/20/2021   Acute cerebral infarction ([place]) 47/82/9562   Acute metabolic encephalopathy 13/11/6576   AKI (acute kidney injury) ([place]) 02/13/2021   Pressure injury of coccygeal region, stage 2 ([place]) 02/13/2021   Nodule of lower lobe of right lung 02/13/2021   Hx of malignant melanoma of skin 02/13/2021   Pressure injury of skin 12/20/2018   Gout 02/19/2016   H/O cardiac catheterization    Hypertensive heart disease with CHF (congestive heart failure) ([place])    Hypoglycemia due to insulin    CKD (chronic kidney disease), stage IV (HCC)    Chronic diastolic CHF (congestive heart failure) ([place])    Morbid obesity ([place])    Hypertension    Hyperlipemia    Sleep apnea    Diabetes mellitus type 2, insulin dependent ([place]) 08/10/2014   PCP:  Pcp, No Pharmacy:   [facility] ([place], [place] - [address] [place] [address] [place] [address] Phone: [phone] Fax: [place], [place] - [place] [address] [place] [address] Phone: [phone] Fax: [phone]     Social Determinants of Health (SDOH) Interventions    Readmission Risk Interventions No flowsheet data found.

## 2021-03-22 NOTE — Plan of Care (Signed)
  Problem: Education: Goal: Knowledge of disease or condition will improve Outcome: Progressing    Problem: Self-Care: Goal: Ability to participate in self-care as condition permits will improve Outcome: Progressing   Problem: Self-Care: Goal: Verbalization of feelings and concerns over difficulty with self-care will improve Outcome: Progressing   Problem: Self-Care: Goal: Ability to communicate needs accurately will improve Outcome: Progressing   Problem: Nutrition: Goal: Risk of aspiration will decrease Outcome: Progressing   Problem: Nutrition: Goal: Dietary intake will improve Outcome: Progressing   Problem: Education: Goal: Knowledge of General Education information will improve Description: Including pain rating scale, medication(s)/side effects and non-pharmacologic comfort measures Outcome: Progressing   Problem: Activity: Goal: Risk for activity intolerance will decrease Outcome: Progressing   Problem: Nutrition: Goal: Adequate nutrition will be maintained Outcome: Progressing   Problem: Coping: Goal: Level of anxiety will decrease Outcome: Progressing   Problem: Elimination: Goal: Will not experience complications related to urinary retention Outcome: Progressing   Problem: Elimination: Goal: Will not experience complications related to bowel motility Outcome: Progressing   Problem: Pain Managment: Goal: General experience of comfort will improve Outcome: Progressing   Problem: Safety: Goal: Ability to remain free from injury will improve Outcome: Progressing

## 2021-03-22 NOTE — Progress Notes (Signed)
Nutrition Brief Note  Patient identified on the Malnutrition Screening Tool (MST) Report.  Admitting Dx: Slurred speech [R47.81] CVA (cerebral vascular accident) ([place]) [I63.9] Stroke ([place]) [I63.9] PMH:  Past Medical History:  Diagnosis Date   Arthritis    "hips before they were replaced, right knee now" (11/09/2015)   Chronic diastolic (congestive) heart failure ([place])    a. echo 06/2015: EF 65-70%, Grade 1 DD, mitral valve calcification.   CKD (chronic kidney disease), stage III (HCC)    Depression    Dyspnea    H/O cardiac catheterization    a. h/o cardiac catheterization 05/2013 in [place] showing only minimal CAD with increased filling pressure.   Hyperlipemia    Hypertensive heart disease    Morbid obesity ([place])    Palsy, Bell's    RA (rheumatoid arthritis) ([place])    Sleep apnea    "suppose to wear mask; couldn't" (11/09/2015)   Type II diabetes mellitus (HCC)    Medications:   allopurinol  100 mg Oral Daily   calcium carbonate  1 tablet Oral QAC supper   chlorhexidine  15 mL Mouth Rinse BID   cholecalciferol  2,000 Units Oral Daily   clopidogrel  75 mg Oral Daily   DULoxetine  30 mg Oral Q T,Th,S,Su   simvastatin  40 mg Oral QHS   And   ezetimibe  10 mg Oral QHS   heparin injection (subcutaneous)  5,000 Units Subcutaneous Q8H   insulin aspart  0-15 Units Subcutaneous TID WC   insulin glargine-yfgn  5 Units Subcutaneous QHS   magnesium oxide  400 mg Oral Daily   mouth rinse  15 mL Mouth Rinse q12n4p   nystatin  1 application Topical BID  Labs: Recent Labs  Lab 03/20/21 0300 03/21/21 0411 03/22/21 0344  NA 140 136 140  K 4.6 4.2 4.3  CL 106 105 105  CO2 25 24 25   BUN 58* 42* 34*  CREATININE 1.88* 1.39* 1.46*  CALCIUM 8.2* 8.5* 8.5*  GLUCOSE 167* 181* 174*  CBGs: 183-221 x 24 hours  Wt Readings from Last 4 Encounters:  03/19/21 90.7 kg  03/16/21 90.7 kg  02/13/21 90.7 kg  12/21/18 100.6 kg  Body mass index is 34.32 kg/m. Patient meets criteria for  obesity based on current BMI.   Current diet order is carb modified, patient is consuming approximately 100% of meals at this time.   No nutrition interventions warranted at this time. If nutrition issues arise, please consult RD.    Theone Stanley., MS, RD, LDN (she/her/hers) RD pager number and weekend/on-call pager number located in [place].

## 2021-03-22 NOTE — Evaluation (Signed)
Speech Language Pathology Evaluation Patient Details Name: JEANANNE BEDWELL MRN: [id number] DOB: [birth year] Today's Date: 03/22/2021 Time: 2119-4174 SLP Time Calculation (min) (ACUTE ONLY): 22 min  Problem List:  Patient Active Problem List   Diagnosis Date Noted   Subdural hematoma 03/20/2021   CVA (cerebral vascular accident) ([place]) 03/20/2021   Acute cerebral infarction ([place]) 11/25/4816   Acute metabolic encephalopathy 56/31/4970   AKI (acute kidney injury) ([place]) 02/13/2021   Pressure injury of coccygeal region, stage 2 ([place]) 02/13/2021   Nodule of lower lobe of right lung 02/13/2021   Hx of malignant melanoma of skin 02/13/2021   Pressure injury of skin 12/20/2018   Gout 02/19/2016   H/O cardiac catheterization    Hypertensive heart disease with CHF (congestive heart failure) ([place])    Hypoglycemia due to insulin    CKD (chronic kidney disease), stage IV (HCC)    Chronic diastolic CHF (congestive heart failure) ([place])    Morbid obesity ([place])    Hypertension    Hyperlipemia    Sleep apnea    Diabetes mellitus type 2, insulin dependent ([place]) 08/10/2014   Past Medical History:  Past Medical History:  Diagnosis Date   Arthritis    "hips before they were replaced, right knee now" (11/09/2015)   Chronic diastolic (congestive) heart failure ([place])    a. echo 06/2015: EF 65-70%, Grade 1 DD, mitral valve calcification.   CKD (chronic kidney disease), stage III (HCC)    Depression    Dyspnea    H/O cardiac catheterization    a. h/o cardiac catheterization 05/2013 in [place] showing only minimal CAD with increased filling pressure.   Hyperlipemia    Hypertensive heart disease    Morbid obesity (HCC)    Palsy, Bell's    RA (rheumatoid arthritis) ([place])    Sleep apnea    "suppose to wear mask; couldn't" (11/09/2015)   Type II diabetes mellitus [facility])    Past Surgical History:  Past Surgical History:  Procedure Laterality Date   APPENDECTOMY  1966   CARDIAC CATHETERIZATION  05/2013    CARPAL TUNNEL RELEASE Right 08/08/2014   Procedure: OPEN CARPAL TUNNEL RELEASE;  Surgeon: Roseanne Kaufman, MD;  Location: [place];  Service: Orthopedics;  Laterality: Right;   CATARACT EXTRACTION W/ INTRAOCULAR LENS  IMPLANT, BILATERAL Bilateral    CHOLECYSTECTOMY OPEN  1966   COLONOSCOPY     FRACTURE SURGERY     JOINT REPLACEMENT     KNEE ARTHROPLASTY Left    ORIF WRIST FRACTURE Right 08/08/2014   Procedure: Right OPEN REDUCTION INTERNAL FIXATION (ORIF) WRIST FRACTURE WITH REPAIR AND RECONST/ALLOGRAFT AND BONE GRAFT;  Surgeon: Roseanne Kaufman, MD;  Location: [place];  Service: Orthopedics;  Laterality: Right;   TOTAL HIP ARTHROPLASTY Bilateral    HPI:  Pt is an 87 y/o female admitted 12/6 secondary to L sided weakness. Imaging showed acute ischemia within the right frontal  operculum, anterior right parietal lobe and posterior right insula, and small infarct in L temporal lobe; L SDH. On 03/16/2021, she had an ED visit for increasing weakness and difficulty walking.  CT no acute abnormality.  Found to have UTI and discharged back to SNF.  03/17/2021, patient again had an ED visit after a fall in the nursing facility hitting her left side of head.  CT no acute abnormality.  Patient discharged back to nursing facility. PMH includes CKD, RA, DM, and dCHF.   Assessment / Plan / Recommendation Clinical Impression  Pt scored 12/30 on the SLUMS, with a  score below 27 suggestive of cognitive impairment. It is not entirely clear what her baseline level of function is, but today she is disoriented and easily distracted even with environmental distractions reduced as much as possible. She has limited awareness and recall of new information. Her responses are delayed and she will often lose her train of thought mid-sentence. Recommend ongoing SLP services to maximizes cognition, communciation, and safety.    SLP Assessment  SLP Recommendation/Assessment: Patient needs continued Speech [place] [facility]  Services SLP Visit Diagnosis: Cognitive communication deficit (R41.841)    Recommendations for follow up therapy are one component of a multi-disciplinary discharge planning process, led by the attending physician.  Recommendations may be updated based on patient status, additional functional criteria and insurance authorization.    Follow Up Recommendations  Skilled nursing-short term rehab (<3 hours/day)    Assistance Recommended at Discharge     Functional Status Assessment Patient has had a recent decline in their functional status and/or demonstrates limited ability to make significant improvements in function in a reasonable and predictable amount of time  Frequency and Duration min 2x/week  2 weeks      SLP Evaluation Cognition  Overall Cognitive Status: No family/caregiver present to determine baseline cognitive functioning Arousal/Alertness: Awake/alert Orientation Level: Disoriented X4 Attention: Sustained Sustained Attention: Impaired Sustained Attention Impairment: Verbal basic;Functional basic Memory: Impaired Memory Impairment: Storage deficit;Retrieval deficit;Decreased recall of new information Awareness: Impaired Awareness Impairment: Intellectual impairment;Emergent impairment;Anticipatory impairment Problem Solving: Impaired Problem Solving Impairment: Verbal basic;Functional basic Safety/Judgment: Impaired       Comprehension  Auditory Comprehension Overall Auditory Comprehension: Impaired Commands: Impaired One Step Basic Commands: 75-100% accurate Conversation: Simple    Expression Expression Primary Mode of Expression: Verbal Verbal Expression Overall Verbal Expression: Impaired Initiation: No impairment Automatic Speech: Name;Social Response Level of Generative/Spontaneous Verbalization: Phrase Interfering Components: Attention Non-Verbal Means of Communication: Not applicable Other Verbal Expression Comments: losing her train of thought  mid-sentence   Oral / Motor  Motor Speech Overall Motor Speech: Appears within functional limits for tasks assessed   GO                    Osie Bond., M.A. [facility] Pager [phone] Office [phone]  03/22/2021, 4:57 PM

## 2021-03-22 NOTE — NC FL2 (Signed)
[place] MEDICAID FL2 LEVEL OF CARE SCREENING TOOL     IDENTIFICATION  Patient Name: Brandi Melton Birthdate: 1930/06/30 Sex: female Admission Date (Current Location): 03/19/2021  [facility] and [place] Number:  Herbalist and Address:  [facility], [place] [address], [place], [place] [address]      Provider Number: [id number]  Attending Physician Name and Address:  Axel Filler, *  Relative Name and Phone Number:  [name] [phone]    Current Level of Care: Hospital Recommended Level of Care: [place] Prior Approval Number:    Date Approved/Denied:   PASRR Number: PASSR Pending- Level 2  Discharge Plan: SNF (ALF)    Current Diagnoses: Patient Active Problem List   Diagnosis Date Noted   Subdural hematoma 03/20/2021   CVA (cerebral vascular accident) ([place]) 03/20/2021   Acute cerebral infarction ([place]) 03/47/4259   Acute metabolic encephalopathy 56/38/7564   AKI (acute kidney injury) ([place]) 02/13/2021   Pressure injury of coccygeal region, stage 2 ([employer]) 02/13/2021   Nodule of lower lobe of right lung 02/13/2021   Hx of malignant melanoma of skin 02/13/2021   Pressure injury of skin 12/20/2018   Gout 02/19/2016   H/O cardiac catheterization    Hypertensive heart disease with CHF (congestive heart failure) ([place])    Hypoglycemia due to insulin    CKD (chronic kidney disease), stage IV (HCC)    Chronic diastolic CHF (congestive heart failure) ([place])    Morbid obesity ([place])    Hypertension    Hyperlipemia    Sleep apnea    Diabetes mellitus type 2, insulin dependent ([place]) 08/10/2014    Orientation RESPIRATION BLADDER Height & Weight     Self  Normal Incontinent Weight: 199 lb 15.3 oz (90.7 kg) Height:  5\' 4"  (162.6 cm)  BEHAVIORAL SYMPTOMS/MOOD NEUROLOGICAL BOWEL NUTRITION STATUS      Incontinent Diet (no added salt)  AMBULATORY STATUS COMMUNICATION OF NEEDS Skin   Extensive Assist Verbally PU Stage and  Appropriate Care (Bilateral Sacrum w/ Silicone Dressing)                       Personal Care Assistance Level of Assistance  Bathing, Feeding, Dressing Bathing Assistance: Maximum assistance Feeding assistance: Limited assistance Dressing Assistance: Maximum assistance     Functional Limitations Info  Sight, Hearing, Speech Sight Info: Impaired (Left- Blind) Hearing Info: Adequate Speech Info: Impaired (Slurred)    SPECIAL CARE FACTORS FREQUENCY  PT (By licensed PT), OT (By licensed OT)     PT Frequency: 5x week OT Frequency: 5x week            Contractures Contractures Info: Not present    Additional Factors Info  Code Status, Allergies, Psychotropic Code Status Info: DNR Allergies Info: NKA Psychotropic Info: Duloxetine         Current Medications (03/22/2021):  This is the current hospital active medication list Current Facility-Administered Medications  Medication Dose Route Frequency Provider Last Rate Last Admin   acetaminophen (TYLENOL) tablet 650 mg  650 mg Oral Q4H PRN Rehman, Areeg N, DO       Or   acetaminophen (TYLENOL) 160 MG/5ML solution 650 mg  650 mg Per Tube Q4H PRN Rehman, Areeg N, DO       Or   acetaminophen (TYLENOL) suppository 650 mg  650 mg Rectal Q4H PRN Rehman, Areeg N, DO       allopurinol (ZYLOPRIM) tablet 100 mg  100  mg Oral Daily Rehman, Areeg N, DO   100 mg at 03/22/21 1007   calcium carbonate (TUMS - dosed in mg elemental calcium) chewable tablet 200 mg of elemental calcium  1 tablet Oral QAC supper Rehman, Areeg N, DO   200 mg of elemental calcium at 03/21/21 1644   chlorhexidine (PERIDEX) 0.12 % solution 15 mL  15 mL Mouth Rinse BID Axel Filler, MD   15 mL at 03/22/21 1007   cholecalciferol (VITAMIN D3) tablet 2,000 Units  2,000 Units Oral Daily Rehman, Areeg N, DO   2,000 Units at 03/22/21 1010   clopidogrel (PLAVIX) tablet 75 mg  75 mg Oral Daily Garvin Fila, MD   75 mg at 03/22/21 1007   DULoxetine (CYMBALTA) DR  capsule 30 mg  30 mg Oral Q T,Th,S,Su Rehman, Areeg N, DO       simvastatin (ZOCOR) tablet 40 mg  40 mg Oral QHS Axel Filler, MD   40 mg at 03/21/21 2255   And   ezetimibe (ZETIA) tablet 10 mg  10 mg Oral QHS Axel Filler, MD   10 mg at 03/21/21 2255   heparin injection 5,000 Units  5,000 Units Subcutaneous Q8H Rehman, Areeg N, DO   5,000 Units at 03/22/21 0622   insulin aspart (novoLOG) injection 0-15 Units  0-15 Units Subcutaneous TID WC Rehman, Areeg N, DO   3 Units at 03/22/21 1008   insulin glargine-yfgn (SEMGLEE) injection 5 Units  5 Units Subcutaneous QHS Iona Beard, MD   5 Units at 03/21/21 2248   lisinopril (ZESTRIL) tablet 5 mg  5 mg Oral Daily Demaio, Alexa, MD       magnesium oxide (MAG-OX) tablet 400 mg  400 mg Oral Daily Rehman, Areeg N, DO   400 mg at 03/22/21 1007   MEDLINE mouth rinse  15 mL Mouth Rinse q12n4p Axel Filler, MD   15 mL at 03/21/21 1648   nystatin (MYCOSTATIN/NYSTOP) topical powder 1 application  1 application Topical BID Rehman, Areeg N, DO   1 application at 44/96/75 1010   senna-docusate (Senokot-S) tablet 1 tablet  1 tablet Oral QHS PRN Rehman, Areeg N, DO         Discharge Medications: Please see discharge summary for a list of discharge medications.  Relevant Imaging Results:  Relevant Lab Results:   Additional Information SS#: [id number]  Coralee Pesa, LCSWA

## 2021-03-22 NOTE — Social Work (Signed)
Please be advised that the above-named patient will require a short-term nursing home stay-anticipated 30 days or less for rehabilitation and strengthening. The plan is for return home.  

## 2021-03-23 DIAGNOSIS — I639 Cerebral infarction, unspecified: Secondary | ICD-10-CM | POA: Diagnosis not present

## 2021-03-23 LAB — GLUCOSE, CAPILLARY
Glucose-Capillary: 188 mg/dL — ABNORMAL HIGH (ref 70–99)
Glucose-Capillary: 194 mg/dL — ABNORMAL HIGH (ref 70–99)
Glucose-Capillary: 207 mg/dL — ABNORMAL HIGH (ref 70–99)
Glucose-Capillary: 246 mg/dL — ABNORMAL HIGH (ref 70–99)
Glucose-Capillary: 266 mg/dL — ABNORMAL HIGH (ref 70–99)

## 2021-03-23 LAB — BASIC METABOLIC PANEL
Anion gap: 7 (ref 5–15)
BUN: 32 mg/dL — ABNORMAL HIGH (ref 8–23)
CO2: 25 mmol/L (ref 22–32)
Calcium: 8.6 mg/dL — ABNORMAL LOW (ref 8.9–10.3)
Chloride: 105 mmol/L (ref 98–111)
Creatinine, Ser: 1.32 mg/dL — ABNORMAL HIGH (ref 0.44–1.00)
GFR, Estimated: 39 mL/min — ABNORMAL LOW (ref >60)
Glucose, Bld: 201 mg/dL — ABNORMAL HIGH (ref 70–99)
Potassium: 4.5 mmol/L (ref 3.5–5.1)
Sodium: 137 mmol/L (ref 135–145)

## 2021-03-23 LAB — CBC
HCT: 34.7 % — ABNORMAL LOW (ref 36.0–46.0)
Hemoglobin: 10.9 g/dL — ABNORMAL LOW (ref 12.0–15.0)
MCH: 29.6 pg (ref 26.0–34.0)
MCHC: 31.4 g/dL (ref 30.0–36.0)
MCV: 94.3 fL (ref 80.0–100.0)
Platelets: 201 10*3/uL (ref 150–400)
RBC: 3.68 MIL/uL — ABNORMAL LOW (ref 3.87–5.11)
RDW: 14.6 % (ref 11.5–15.5)
WBC: 9.5 10*3/uL (ref 4.0–10.5)
nRBC: 0 % (ref 0.0–0.2)

## 2021-03-23 NOTE — TOC Progression Note (Signed)
Transition of Care [facility]) - Progression Note    Patient Details  Name: Brandi Melton MRN: [id number] Date of Birth: 1931-03-26  Transition of Care [facility]) CM/SW [place], [place] Phone Number: 03/23/2021, 12:33 PM  Clinical Narrative:    CSW spoke with pt's son in law and updated him on pt's disposition. They were advised that their first choice, [facility], was still reviewing and would not have a bed available until Wednesday or Thursday. The plan is to follow up with [facility] Monday and determine if and when they can offer a bed. Family was notified that due to [facility] taking a long time to provide authorization, a decision will need to be made Monday. They stated [facility] would be there second choice, but would like to try for [facility].  Family was also provided with the pending and declined offers as well.  Family noted that [facility] had declined and asked if CSW would follow back up with them, as they would be top of their list. CSW advised [facility] generally has a waitlist, and rarely has open beds, per the decline note they left. TOC will continue to follow for DC needs.   Expected Discharge Plan: Skilled Nursing Facility Barriers to Discharge: Ship broker, Continued Medical Work up, SNF Pending bed offer  Expected Discharge Plan and Services Expected Discharge Plan: [place] In-house Referral: Clinical Social Work   Post Acute Care Choice: [place] Living arrangements for the past 2 months: [place]                                       Social Determinants of Health (SDOH) Interventions    Readmission Risk Interventions No flowsheet data found.

## 2021-03-23 NOTE — Progress Notes (Signed)
HD#3 Subjective:  Overnight Events: NAEO   Patient states that she is not having any current issues. She was evaluated by speech therapy and was noted to have a VA slums score of 12/30.  Objective:  Vital signs in last 24 hours: Vitals:   03/22/21 1950 03/22/21 2340 03/23/21 0347 03/23/21 1011  BP: (!) 154/56 (!) 135/53 (!) 141/55 (!) 158/67  Pulse: 65 66 64 73  Resp: 18 17 17    Temp: 98 F (36.7 C) 97.9 F (36.6 C) 98.1 F (36.7 C) 98 F (36.7 C)  TempSrc: Oral  Oral Oral  SpO2: 96% 94% 91% 94%  Weight:      Height:       Supplemental O2: Room Air SpO2: 94 %   Physical Exam:  Constitutional: Elderly woman resting in bed comfortably in NAD HENT: unchanged ecchymosis over left eye CV: RRR no m/r/g Pulmonary/Chest: normal work of breathing on room air Abdominal: soft, non-tender, non-distended MSK: normal bulk and tone Neurological: alert, oriented to self, answering questions appropriately, 4/5 strength on L hip flexion, able to laterally move left arm via deltoid but 0/5 on hand grip and forearm muscles. L sided neglect Skin: warm and dry Psych: normal affect  Filed Weights   03/19/21 1509  Weight: 90.7 kg     Intake/Output Summary (Last 24 hours) at 03/23/2021 1100 Last data filed at 03/23/2021 0600 Gross per 24 hour  Intake 360 ml  Output 650 ml  Net -290 ml    Net IO Since Admission: -1,600 mL [03/23/21 1100]  Pertinent Labs: CBC Latest Ref Rng & Units 03/23/2021 03/22/2021 03/21/2021  WBC 4.0 - 10.5 K/uL 9.5 9.1 9.7  Hemoglobin 12.0 - 15.0 g/dL 10.9(L) 10.5(L) 10.2(L)  Hematocrit 36.0 - 46.0 % 34.7(L) 34.3(L) 33.0(L)  Platelets 150 - 400 K/uL 201 184 183    CMP Latest Ref Rng & Units 03/23/2021 03/22/2021 03/21/2021  Glucose 70 - 99 mg/dL 201(H) 174(H) 181(H)  BUN 8 - 23 mg/dL 32(H) 34(H) 42(H)  Creatinine 0.44 - 1.00 mg/dL 1.32(H) 1.46(H) 1.39(H)  Sodium 135 - 145 mmol/L 137 140 136  Potassium 3.5 - 5.1 mmol/L 4.5 4.3 4.2  Chloride 98 - 111  mmol/L 105 105 105  CO2 22 - 32 mmol/L 25 25 24   Calcium 8.9 - 10.3 mg/dL 8.6(L) 8.5(L) 8.5(L)  Total Protein 6.5 - 8.1 g/dL - - -  Total Bilirubin 0.3 - 1.2 mg/dL - - -  Alkaline Phos 38 - 126 U/L - - -  AST 15 - 41 U/L - - -  ALT 0 - 44 U/L - - -    Imaging: No results found.  Assessment/Plan:   Principal Problem:   Acute cerebral infarction [facility]) Active Problems:   Diabetes mellitus type 2, insulin dependent ([place])   Hypertension   Hyperlipemia   CKD (chronic kidney disease), stage IV (HCC)   AKI (acute kidney injury) ([place])   Subdural hematoma   CVA (cerebral vascular accident) [facility])   Patient Summary: Brandi Melton is a 87 y.o. with a pertinent PMH of type II DM, hyperlipidemia, hypertension, CKD stage III, gout and diastolic heart failure presenting with aphasia and code stroke.   Acute right parietal lobe infarct [facility] MRI brain showed acute ischemia within right frontal operculum, anterior right parietal lobe and posterior right insula.  Also a small acute infarct of the left temporal lobe. With the multifocal nature of these infarcts suspect embolic source as etiology. Patient likely first started showing  symptoms 12/4. Repeat CT head this hospitalization with expected evolution of acute frontoparietal and posterior insular infarct.   Given the significant deficits, patient's ability to return to prior functional status will be challenging.Patient has been intermittently confused requiring a sitter briefly. She was more alert and less confused this AM. - Continue plavix 75 daily -- Continue home lisinopril 5 - continue atorvastatin 80 mg + zetia  - Telemetry: no Afib seen - TTE  showed grade 1 diastolic dysfunction with an EF Of 60-65% - Carotid vascular ultrasound showed mild stenosis - plan for SNF, awaiting family to make a decision regarding facility  - Neurology signed off   Subdural Hematoma Patient recently sustained multiple mechanical falls  (12/3 and 12/4); patient was seen in the ED for both occurences. Imaging on admission 12/6 showed thin left hemispheric subdural hematoma measuring 3 mm on MRI of the brain. Repeat imaging on 12/7 showed grossly stable to minimally increased SDH up to 5 mm with no mass effect. However, was able to speak with neurosurgery who felt SDH is very minimal and likely not causing symptoms and no interventions needed at this time.  --Frequent neuro checks --Avoid DAPT in the setting of SDH   Type 2 DM Most recent hemoglobin A1c s 7.2.  Patient is on insulin aspart 20 mg 3 times daily with meals and insulin glargine 40 units twice daily.  Blood glucoses now improved.  -CBG monitoring -Continue SSI + 5u TID w/ meals    Hyperlipidemia LDL on lipid panel 53, previously on moderate intensity statin therapy which was increased to high intensity after admission. -- atorvastatin 80 + zetia 10mg     Hypertension Diastolic heart failure Medications include lisinopril, Coreg, and torsemide. Holding in the setting of permissive hypertension. TTE on admission showed normal EF 54-98% grade 1 diastolic dysfunction. -Continue lisinopril 5 -Strict ins and outs -Daily weights   AKI on CKD stage IIIb; resolved Creatinine on admission 2.28, improved to patient's baseline.Will need to monitor patient's ability to maintain good PO intake with new deficits. - daily bmp - Avoid nephrotoxic agents   Anemia of CKD Hemoglobin appears to be close to baseline of about 10 -- daily CBC   Stage II sacral pressure injury -Frequent repositioning and barrier cream   Gout -Continue allopurinol 100 mg daily   History of stage II melanoma Status post excision in July of this year.  Margins were clear on pathology review.   RLL lung nodule CT chest during last admission showed stable right lower lobe pulmonary nodules and recommended follow-up noncontrast chest CT in about 6 months.  Largest nodule measuring up to 8 mm.    Anxiety/depression, chronic and stable -Continue Cymbalta  Rick Duff, MD PGY-2 Internal Medicine  Pager [phone]  Please contact the on call pager after 5 pm and on weekends at [phone].

## 2021-03-24 LAB — GLUCOSE, CAPILLARY
Glucose-Capillary: 173 mg/dL — ABNORMAL HIGH (ref 70–99)
Glucose-Capillary: 185 mg/dL — ABNORMAL HIGH (ref 70–99)
Glucose-Capillary: 214 mg/dL — ABNORMAL HIGH (ref 70–99)
Glucose-Capillary: 215 mg/dL — ABNORMAL HIGH (ref 70–99)
Glucose-Capillary: 250 mg/dL — ABNORMAL HIGH (ref 70–99)
Glucose-Capillary: 260 mg/dL — ABNORMAL HIGH (ref 70–99)

## 2021-03-24 MED ORDER — INSULIN ASPART 100 UNIT/ML IJ SOLN
5.0000 [IU] | Freq: Three times a day (TID) | INTRAMUSCULAR | Status: DC
  Administered 2021-03-24 – 2021-03-27 (×8): 5 [IU] via SUBCUTANEOUS

## 2021-03-24 NOTE — Progress Notes (Signed)
HD#4 Subjective:  Overnight Events: NAEO   Patient evaluated at bedside. States she is feeling well. No acute complaints at this time.   Objective:  Vital signs in last 24 hours: Vitals:   03/23/21 1600 03/23/21 1944 03/23/21 2332 03/24/21 0359  BP: (!) 165/67 (!) 150/57 (!) 146/57 (!) 152/54  Pulse:  75 78 80  Resp: 20 (!) 22 16 (!) 22  Temp: 98 F (36.7 C) 98.1 F (36.7 C) 98.6 F (37 C) 98 F (36.7 C)  TempSrc: Oral Oral Oral Axillary  SpO2:  96% 95% 96%  Weight:      Height:       Supplemental O2: Room Air SpO2: 96 %   Physical Exam:  Constitutional: Elderly woman resting in bed comfortably in NAD HENT: unchanged ecchymosis over left eye CV: RRR no m/r/g Pulmonary/Chest: normal work of breathing on room air Abdominal: soft, non-tender, non-distended MSK: normal bulk and tone Neurological: alert, oriented to self, answering questions appropriately, 4/5 strength of LLE, 0/5 of the LUE. L sided neglect Skin: warm and dry Psych: normal affect  Filed Weights   03/19/21 1509  Weight: 90.7 kg     Intake/Output Summary (Last 24 hours) at 03/24/2021 0656 Last data filed at 03/24/2021 0400 Gross per 24 hour  Intake --  Output 2150 ml  Net -2150 ml    Net IO Since Admission: -3,750 mL [03/24/21 0656]  Pertinent Labs: CBC Latest Ref Rng & Units 03/23/2021 03/22/2021 03/21/2021  WBC 4.0 - 10.5 K/uL 9.5 9.1 9.7  Hemoglobin 12.0 - 15.0 g/dL 10.9(L) 10.5(L) 10.2(L)  Hematocrit 36.0 - 46.0 % 34.7(L) 34.3(L) 33.0(L)  Platelets 150 - 400 K/uL 201 184 183    CMP Latest Ref Rng & Units 03/23/2021 03/22/2021 03/21/2021  Glucose 70 - 99 mg/dL 201(H) 174(H) 181(H)  BUN 8 - 23 mg/dL 32(H) 34(H) 42(H)  Creatinine 0.44 - 1.00 mg/dL 1.32(H) 1.46(H) 1.39(H)  Sodium 135 - 145 mmol/L 137 140 136  Potassium 3.5 - 5.1 mmol/L 4.5 4.3 4.2  Chloride 98 - 111 mmol/L 105 105 105  CO2 22 - 32 mmol/L 25 25 24   Calcium 8.9 - 10.3 mg/dL 8.6(L) 8.5(L) 8.5(L)  Total Protein 6.5 - 8.1  g/dL - - -  Total Bilirubin 0.3 - 1.2 mg/dL - - -  Alkaline Phos 38 - 126 U/L - - -  AST 15 - 41 U/L - - -  ALT 0 - 44 U/L - - -    Imaging: No results found.  Assessment/Plan:   Principal Problem:   Acute cerebral infarction [facility]) Active Problems:   Diabetes mellitus type 2, insulin dependent ([place])   Hypertension   Hyperlipemia   CKD (chronic kidney disease), stage IV (HCC)   AKI (acute kidney injury) ([place])   Subdural hematoma   CVA (cerebral vascular accident) [facility])   Patient Summary: Brandi Melton is a 87 y.o. with a pertinent PMH of type II DM, hyperlipidemia, hypertension, CKD stage III, gout and diastolic heart failure presenting with aphasia and code stroke.   Acute right parietal lobe infarct [facility] MRI brain showed acute ischemia within right frontal operculum, anterior right parietal lobe and posterior right insula.  Also a small acute infarct of the left temporal lobe. With the multifocal nature of these infarcts suspect embolic source as etiology. Patient likely first started showing symptoms 12/4. Repeat CT head this hospitalization with expected evolution of acute frontoparietal and posterior insular infarct.   Given the significant deficits, patient's  ability to return to prior functional status will be challenging.has had some intermittent confusion concerning for delirium during this hospitalization.  Although has been oriented for the past 3 days and has not required a sitter.   - Plavix 75 mg daily -- Continue atorvastatin 80 mg daily and Zetia 10 mg daily. - We will continue on lisinopril 5 mg daily - Monitor on telemetry no A. fib - TTE  showed grade 1 diastolic dysfunction with an EF Of 60-65% - Carotid vascular ultrasound showed mild stenosis - Patient is stable for discharge.  Plan to discharge to subacute rehab.  TOC consulted family would like her to discharge to [facility] or [place] pending insurance authorization - Neurology signed off    Subdural Hematoma Patient recently sustained multiple mechanical falls (12/3 and 12/4); patient was seen in the ED for both occurences. Imaging on admission 12/6 showed thin left hemispheric subdural hematoma measuring 3 mm on MRI of the brain. Repeat imaging on 12/7 showed grossly stable to minimally increased SDH up to 5 mm with no mass effect. However, was able to speak with neurosurgery who felt SDH is very minimal and likely not causing symptoms and no interventions needed at this time.  --Frequent neuro checks --Avoid DAPT in the setting of SDH   Type 2 DM Most recent hemoglobin A1c s 7.2.  Patient is on insulin aspart 20 mg 3 times daily with meals and insulin glargine 40 units twice daily.  Elevated CBGs with meals.  We will add on 5 units for mealtime coverage -CBG monitoring -SSI  --NovoLog 5 units 3 times daily with meals --Semglee 5 units daily   Hyperlipidemia LDL on lipid panel 53, previously on moderate intensity statin therapy which was increased to high intensity after admission. -- atorvastatin 80 + zetia 10mg     Hypertension Diastolic heart failure Medications include lisinopril, Coreg, and torsemide. TTE on admission showed normal EF 62-83% grade 1 diastolic dysfunction. -Continue lisinopril 5 mg -Strict ins and outs -Daily weights   AKI on CKD stage IIIb; resolved Creatinine on admission 2.28, improved to patient's baseline.Will need to monitor patient's ability to maintain good PO intake with new deficits. - daily bmp - Avoid nephrotoxic agents   Anemia of CKD Stable.   Stage II sacral pressure injury -Frequent repositioning and barrier cream   Gout -Continue allopurinol 100 mg daily   History of stage II melanoma Status post excision in July of this year.  Margins were clear on pathology review.   RLL lung nodule CT chest during last admission showed stable right lower lobe pulmonary nodules and recommended follow-up noncontrast chest CT in about 6  months.  Largest nodule measuring up to 8 mm.   Anxiety/depression, chronic and stable -Continue Cymbalta  Iona Beard, PGY-2 Internal Medicine  Pager [phone]  Please contact the on call pager after 5 pm and on weekends at [phone].

## 2021-03-25 LAB — GLUCOSE, CAPILLARY
Glucose-Capillary: 190 mg/dL — ABNORMAL HIGH (ref 70–99)
Glucose-Capillary: 168 mg/dL — ABNORMAL HIGH (ref 70–99)
Glucose-Capillary: 180 mg/dL — ABNORMAL HIGH (ref 70–99)
Glucose-Capillary: 269 mg/dL — ABNORMAL HIGH (ref 70–99)
Glucose-Capillary: 261 mg/dL — ABNORMAL HIGH (ref 70–99)
Glucose-Capillary: 227 mg/dL — ABNORMAL HIGH (ref 70–99)

## 2021-03-25 MED ORDER — INSULIN GLARGINE-YFGN 100 UNIT/ML ~~LOC~~ SOLN
6.0000 [IU] | Freq: Every day | SUBCUTANEOUS | Status: DC
Start: 2021-03-25 — End: 2021-03-26
  Administered 2021-03-25: 6 [IU] via SUBCUTANEOUS
  Filled 2021-03-25 (×2): qty 0.06

## 2021-03-25 NOTE — TOC Progression Note (Signed)
Transition of Care [facility]) - Progression Note    Patient Details  Name: Brandi Melton MRN: [id number] Date of Birth: [birth year]  Transition of Care [facility]) CM/SW [place], [place] Phone Number: 03/25/2021, 12:07 PM  Clinical Narrative:   CSW spoke with Admissions at [facility] and confirmed bed offer for patient. CSW submitted for initiation of insurance authorization request. CSW updated patient's daughter, Brandi Melton, who was appreciative of update. CSW answered Brandi Melton's questions about when transfer may happen and how transportation gets set up. CSW to continue to follow.    Expected Discharge Plan: [name], [name] In-house Referral: Clinical Social Work   Post Acute Care Choice: [place] Living arrangements for the past 2 months: [place]                                       Social Determinants of Health (SDOH) Interventions    Readmission Risk Interventions No flowsheet data found.

## 2021-03-25 NOTE — Discharge Summary (Signed)
Name: Brandi Melton MRN: [id number] DOB: [birth year] 87 y.o. PCP: Pcp, No  Date of Admission: 03/19/2021  3:03 PM Date of Discharge:   03/28/21 Attending Physician: Axel Filler, *  Discharge Diagnosis: 1. Acute right parietal lobe infarct; Hospital Delirium 2. Subdural Hematoma 3. Type 2 DM 4. Hyperlipidemia 5. Hypertension 6. Diastolic heart failure 7. AKI on CKD stage IIIb; resolved 8. Anemia of CKD 9. Stage II sacral pressure injury 10. History of stage II melanoma  Discharge Medications: Allergies as of 03/28/2021   No Known Allergies      Medication List     STOP taking these medications    aspirin EC 81 MG tablet   cephALEXin 500 MG capsule Commonly known as: KEFLEX   potassium chloride SA 20 MEQ tablet Commonly known as: KLOR-CON M   torsemide 20 MG tablet Commonly known as: DEMADEX       TAKE these medications    Accu-Chek Aviva Plus test strip Generic drug: glucose blood TEST BS QID   acetaminophen 325 MG tablet Commonly known as: TYLENOL Take 650 mg by mouth every 6 (six) hours as needed for fever or mild pain.   allopurinol 100 MG tablet Commonly known as: ZYLOPRIM Take 100 mg by mouth daily.   BAZA EX Apply 1 application topically See admin instructions. Apply to buttocks three times a day   calcium carbonate 500 MG chewable tablet Commonly known as: TUMS - dosed in mg elemental calcium Chew 1 tablet (200 mg of elemental calcium total) by mouth daily. What changed: how much to take   carvedilol 80 MG 24 hr capsule Commonly known as: COREG CR TAKE 1 CAPSULE BY MOUTH  DAILY   clopidogrel 75 MG tablet Commonly known as: PLAVIX Take 1 tablet (75 mg total) by mouth daily.   DULoxetine 30 MG capsule Commonly known as: CYMBALTA Take 30 mg by mouth See admin instructions. 30 mg daily on Tuesday, Thursday, Saturday, and Sunday.   ezetimibe-simvastatin 10-40 MG tablet Commonly known as: VYTORIN Take 1 tablet by mouth at  bedtime. What changed: when to take this   glucose 4 GM chewable tablet Chew 4 g by mouth 3 (three) times daily as needed for low blood sugar (bs <70).   guaiFENesin 100 MG/5ML liquid Commonly known as: ROBITUSSIN Take 200 mg by mouth every 4 (four) hours as needed for cough.   insulin aspart 100 UNIT/ML FlexPen Commonly known as: NOVOLOG Inject 5 Units into the skin 3 (three) times daily with meals. What changed: how much to take   insulin glargine 100 UNIT/ML Solostar Pen Commonly known as: LANTUS Inject 8 Units into the skin 2 (two) times daily. What changed: how much to take   lisinopril 5 MG tablet Commonly known as: ZESTRIL Take 5 mg by mouth daily.   magnesium oxide 400 MG tablet Commonly known as: MAG-OX Take 1 tablet (400 mg total) by mouth daily.   nystatin powder Commonly known as: MYCOSTATIN/NYSTOP Apply 1 application topically See admin instructions. Apply under the breast twice daily until healed   oxybutynin 5 MG tablet Commonly known as: DITROPAN Take 2.5 mg by mouth 2 (two) times daily.   polyethylene glycol 17 g packet Commonly known as: MIRALAX / GLYCOLAX Take 17 g by mouth 2 (two) times daily. What changed:  when to take this reasons to take this   senna-docusate 8.6-50 MG tablet Commonly known as: Senokot-S Take 1 tablet by mouth at bedtime as needed for mild constipation. What changed:  when to take this additional instructions   Vitamin D2 50 MCG (2000 UT) Tabs Take 2,000 Units by mouth daily.        Disposition and follow-up:   Brandi Melton was discharged from [facility] in Stable condition.  At the hospital follow up visit please address:  1.  Acute right parietal lobe infarct- assess how rehab is going, recheck deficits.   2. T2DM- assess sugar control/possible lows in the setting of new deficits/ability to perforn ADLs like eating without assistence  3. AKI on CKD stage IIIb- recheck creatinine  4. RLL  lung nodule- depending on GOC, warrants follow up CT in 6 months  5. Anxiety/depression- reassess after acute CVA/change in independence  2.  Labs / imaging needed at time of follow-up:  bmp  3.  Pending labs/ test needing follow-up: none  Follow-up Appointments:   Hospital Course by problem list: 1. Acute right parietal lobe infarct; Hospital Delirium; Subdural Hematoma Patient recently sustained multiple mechanical falls (12/3 and 12/4); patient was seen in the ED for both occurrences but no acute abnormalities were seen on imaging at that time. On 12/6 patient was brought back into the ED due to concern for new neuro deficits. On admission patient had slurred speech, 0/5 strength of the LUE, 4/5 strength of the LLE, and loss of sensation in the LUE. MRI brain showed acute ischemia within right frontal operculum, anterior right parietal lobe and posterior right insula.  Also a small acute infarct of the left temporal lobe. With the multifocal nature of these infarcts suspect embolic source as etiology. Patient likely first started showing symptoms 12/4 and therefore was outside of the tpa window. Imaging on admission (12/6) raised concern for a 74mm SDH that was not seen on prior imaging after patient's falls. Ct was repeated 24 hours later on 12/7 which showed a possible increase in SDH size up to 5 mm with no mass effect. Spoke with neurosurgery given concern for progression but they felt that SDH was very minimal and unlikely to be causing symptoms and recommended no interventions. However given the SDH patient was not put on DAPT. She was initially started on aspirin on 12/6 which was switched to plavix on 12/7. She was also started on atorvastatin 80. TTE showed grade 1 diastolic dysfunction EF 32-95%. Carotid ultrasound showed mild stenosis, and telemetry showed no Afib. She also had a small amount of hospital delirium briefly requiring a sitter but with delirium precautions and good family  support this resolved. Given patient's significant neuro deficits she was felt to be appropriate for SNF.   2. Type 2 DM Most recent hemoglobin A1c s 7.2.  Patient is on insulin aspart 20 mg 3 times daily with meals and insulin glargine 40 units twice daily.  While admitted she was kept on novolog 5 TID and semglee 8 daily as well as SSI.   3. Hyperlipidemia LDL on lipid panel 53, previously on moderate intensity statin therapy which was increased to high intensity after admission. She was continued on atorvastatin 80 + zetia 10mg    4. Hypertension; Diastolic heart failure Home medications include lisinopril, Coreg, and torsemide. TTE on admission showed normal EF 18-84% grade 1 diastolic dysfunction. BP meds were initially held 2/2 permissive HTN for CVA above. Patient was euvolemic on exam and torsemide was d/c on discharge,  5. AKI on CKD stage IIIb Patient came in with a creatinine of 2.28 on admission. This improved back to her baseline with better  PO intake.   8. Anemia of CKD Stable, required no transfusions  9. Stage II sacral pressure injury Frequent repositioning, barrier cream, and good wound care was used.  10. History of stage II melanoma Status post excision in July of this year.  Margins were clear on pathology review.  11. Anxiety/depression Continue Cymbalta    Discharge Exam:   BP (!) 134/57 (BP Location: Left Arm)   Pulse 78   Temp 97.9 F (36.6 C) (Oral)   Resp 20   Ht 5\' 4"  (1.626 m)   Wt 90.7 kg   SpO2 94%   BMI 34.32 kg/m  Discharge exam:  Constitutional: Elderly woman resting in bed comfortably in NAD  HENT: unchanged ecchymosis over left eye CV: RRR no m/r/g Pulmonary/Chest: normal work of breathing on room air Abdominal: soft, non-tender, non-distended MSK: normal bulk and tone Neurological: alert, oriented to self, answering questions appropriately, 4/5 strength of proximal LLE, trace movement of the distal LUE, 2/5 lateral movement of the  proximal LUE. Loss of sensation in the LUE Skin: warm and dry Psych: normal affect    Pertinent Labs, Studies, and Procedures:   CT HEAD WO CONTRAST (5MM)  Result Date: 03/20/2021 CLINICAL DATA:  Stroke follow-up EXAM: CT HEAD WITHOUT CONTRAST TECHNIQUE: Contiguous axial images were obtained from the base of the skull through the vertex without intravenous contrast. COMPARISON:  CT head [id number] 5:17 p.m., MR head 03/19/2021 FINDINGS: Brain: Expected evolution of an acute right frontoparietal and posterior insular infarction. Patchy and confluent areas of decreased attenuation are noted throughout the deep and periventricular white matter of the cerebral hemispheres bilaterally, compatible with chronic microvascular ischemic disease. Otherwise no parenchymal hemorrhage. No mass lesion. Redemonstration of a likely subacute up to 5 mm left subdural hematoma along the left calvarial convexity. No mass effect or midline shift. No hydrocephalus. Basilar cisterns are patent. Vascular: No hyperdense vessel. Atherosclerotic calcifications are present within the cavernous internal carotid arteries. Skull: No acute fracture or focal lesion. Sinuses/Orbits: Sphenoid mucosal thickening.Paranasal sinuses and mastoid air cells are clear. The orbits are unremarkable. Other: Mild left frontal scalp edema. IMPRESSION: 1. Expected evolution of an acute right frontoparietal and posterior insular infarction. 2. Grossly stable to possibly minimally increased in size, likely subacute, up to 5 mm, left subdural hematoma. No associated mass effect. 3. No new intracranial abnormality. Electronically Signed   By: Iven Finn M.D.   On: 03/20/2021 15:19   CT Head Wo Contrast  Result Date: 03/19/2021 CLINICAL DATA:  Trauma, left-sided weakness.  Recent fall. EXAM: CT HEAD WITHOUT CONTRAST CT CERVICAL SPINE WITHOUT CONTRAST TECHNIQUE: Multidetector CT imaging of the head and cervical spine was performed following the standard  protocol without intravenous contrast. Multiplanar CT image reconstructions of the cervical spine were also generated. COMPARISON:  CT head and cervical spine 03/17/2021. FINDINGS: CT HEAD FINDINGS Brain: There is a small area of deep white matter and cortical hypodensity in the right parietal lobe image 3/18 which is new from prior worrisome for acute infarct. There is mild sulcal effacement without significant mass effect or midline shift. There is no acute intracranial hemorrhage or extra-axial fluid collection. There is no hydrocephalus. There is mild diffuse atrophy, unchanged. There is stable mild periventricular white matter hypodensity, likely chronic small vessel ischemic change. Vascular: No hyperdense vessel or unexpected calcification. Skull: Normal. Negative for fracture or focal lesion. Sinuses/Orbits: There is mucosal thickening of the right sphenoid sinus. Mastoid air cells are clear. Other: Left frontal scalp soft tissue  swelling is again noted, decreased from prior. CT CERVICAL SPINE FINDINGS Alignment: There is stable 3 mm of anterolisthesis at C3-C4 which is likely degenerative. Alignment is otherwise anatomic. Skull base and vertebrae: No acute fracture. No primary bone lesion or focal pathologic process. Soft tissues and spinal canal: No prevertebral fluid or swelling. No visible canal hematoma. Disc levels: There is significant disc space narrowing and endplate osteophyte formation throughout the mid and lower cervical spine which is similar to the prior examination. There is also significant bilateral facet joint degenerative change. Multilevel neural foraminal stenosis on the left appears similar to the prior examination. Mild central canal stenosis at C5-C6 is also similar to the prior examination. Upper chest: Negative. Other: None. IMPRESSION: 1. Small acute infarct in the right parietal lobe. No midline shift. No acute intracranial hemorrhage. 2. No acute fracture or traumatic  subluxation of the cervical spine. Electronically Signed   By: Ronney Asters M.D.   On: 03/19/2021 17:47   CT Cervical Spine Wo Contrast  Result Date: 03/19/2021 CLINICAL DATA:  Trauma, left-sided weakness.  Recent fall. EXAM: CT HEAD WITHOUT CONTRAST CT CERVICAL SPINE WITHOUT CONTRAST TECHNIQUE: Multidetector CT imaging of the head and cervical spine was performed following the standard protocol without intravenous contrast. Multiplanar CT image reconstructions of the cervical spine were also generated. COMPARISON:  CT head and cervical spine 03/17/2021. FINDINGS: CT HEAD FINDINGS Brain: There is a small area of deep white matter and cortical hypodensity in the right parietal lobe image 3/18 which is new from prior worrisome for acute infarct. There is mild sulcal effacement without significant mass effect or midline shift. There is no acute intracranial hemorrhage or extra-axial fluid collection. There is no hydrocephalus. There is mild diffuse atrophy, unchanged. There is stable mild periventricular white matter hypodensity, likely chronic small vessel ischemic change. Vascular: No hyperdense vessel or unexpected calcification. Skull: Normal. Negative for fracture or focal lesion. Sinuses/Orbits: There is mucosal thickening of the right sphenoid sinus. Mastoid air cells are clear. Other: Left frontal scalp soft tissue swelling is again noted, decreased from prior. CT CERVICAL SPINE FINDINGS Alignment: There is stable 3 mm of anterolisthesis at C3-C4 which is likely degenerative. Alignment is otherwise anatomic. Skull base and vertebrae: No acute fracture. No primary bone lesion or focal pathologic process. Soft tissues and spinal canal: No prevertebral fluid or swelling. No visible canal hematoma. Disc levels: There is significant disc space narrowing and endplate osteophyte formation throughout the mid and lower cervical spine which is similar to the prior examination. There is also significant bilateral  facet joint degenerative change. Multilevel neural foraminal stenosis on the left appears similar to the prior examination. Mild central canal stenosis at C5-C6 is also similar to the prior examination. Upper chest: Negative. Other: None. IMPRESSION: 1. Small acute infarct in the right parietal lobe. No midline shift. No acute intracranial hemorrhage. 2. No acute fracture or traumatic subluxation of the cervical spine. Electronically Signed   By: Ronney Asters M.D.   On: 03/19/2021 17:47   MR ANGIO HEAD WO CONTRAST  Result Date: 03/19/2021 CLINICAL DATA:  Stroke follow-up EXAM: MRI HEAD WITHOUT CONTRAST MRA HEAD WITHOUT CONTRAST TECHNIQUE: Multiplanar, multi-echo pulse sequences of the brain and surrounding structures were acquired without intravenous contrast. Angiographic images of the Circle of Willis were acquired using MRA technique without intravenous contrast. COMPARISON:  10/04/2020 brain MRI Head CT 03/19/2021 FINDINGS: MRI HEAD FINDINGS Brain: Medium-sized area of abnormal diffusion restriction within the right frontal operculum, anterior right parietal  lobe and posterior right insula. Small area of diffusion restriction in the left temporal lobe. There is a thin left hemispheric subdural hematoma measuring 3 mm. There is multifocal hyperintense T2-weighted signal within the white matter. Generalized volume loss without a clear lobar predilection. Normal midline structures. Vascular: Major flow voids are preserved. Skull and upper cervical spine: Normal calvarium and skull base. Visualized upper cervical spine and soft tissues are normal. Sinuses/Orbits:No paranasal sinus fluid levels or advanced mucosal thickening. No mastoid or middle ear effusion. Normal orbits. MRA HEAD FINDINGS POSTERIOR CIRCULATION: --Vertebral arteries: Normal --Inferior cerebellar arteries: Normal. --Basilar artery: Normal. --Superior cerebellar arteries: Normal. --Posterior cerebral arteries: Normal. ANTERIOR CIRCULATION:  --Intracranial internal carotid arteries: Normal. --Anterior cerebral arteries (ACA): Normal. --Middle cerebral arteries (MCA): Normal. ANATOMIC VARIANTS: None IMPRESSION: 1. Medium-sized area of acute ischemia within the right frontal operculum, anterior right parietal lobe and posterior right insula. 2. Small acute infarct of the left temporal lobe. 3. Normal intracranial MRA. 4. Thin left hemispheric subdural hematoma measuring 3 mm. These results were communicated to [place] at 9:46 pm on 03/19/2021 by text page via the [facility] messaging system. Electronically Signed   By: Ulyses Jarred M.D.   On: 03/19/2021 21:46   MR BRAIN WO CONTRAST  Result Date: 03/19/2021 CLINICAL DATA:  Stroke follow-up EXAM: MRI HEAD WITHOUT CONTRAST MRA HEAD WITHOUT CONTRAST TECHNIQUE: Multiplanar, multi-echo pulse sequences of the brain and surrounding structures were acquired without intravenous contrast. Angiographic images of the Circle of Willis were acquired using MRA technique without intravenous contrast. COMPARISON:  10/04/2020 brain MRI Head CT 03/19/2021 FINDINGS: MRI HEAD FINDINGS Brain: Medium-sized area of abnormal diffusion restriction within the right frontal operculum, anterior right parietal lobe and posterior right insula. Small area of diffusion restriction in the left temporal lobe. There is a thin left hemispheric subdural hematoma measuring 3 mm. There is multifocal hyperintense T2-weighted signal within the white matter. Generalized volume loss without a clear lobar predilection. Normal midline structures. Vascular: Major flow voids are preserved. Skull and upper cervical spine: Normal calvarium and skull base. Visualized upper cervical spine and soft tissues are normal. Sinuses/Orbits:No paranasal sinus fluid levels or advanced mucosal thickening. No mastoid or middle ear effusion. Normal orbits. MRA HEAD FINDINGS POSTERIOR CIRCULATION: --Vertebral arteries: Normal --Inferior cerebellar arteries: Normal.  --Basilar artery: Normal. --Superior cerebellar arteries: Normal. --Posterior cerebral arteries: Normal. ANTERIOR CIRCULATION: --Intracranial internal carotid arteries: Normal. --Anterior cerebral arteries (ACA): Normal. --Middle cerebral arteries (MCA): Normal. ANATOMIC VARIANTS: None IMPRESSION: 1. Medium-sized area of acute ischemia within the right frontal operculum, anterior right parietal lobe and posterior right insula. 2. Small acute infarct of the left temporal lobe. 3. Normal intracranial MRA. 4. Thin left hemispheric subdural hematoma measuring 3 mm. These results were communicated to [place] at 9:46 pm on 03/19/2021 by text page via the [facility] messaging system. Electronically Signed   By: Ulyses Jarred M.D.   On: 03/19/2021 21:46   ECHOCARDIOGRAM COMPLETE  Result Date: 03/20/2021    ECHOCARDIOGRAM REPORT   Patient Name:   Brandi Melton Date of Exam: 03/20/2021 Medical Rec #:  [id number]    Height:       64.0 in Accession #:    [id number]   Weight:       200.0 lb Date of Birth:  December 13, 1930    BSA:          1.956 m Patient Age:    3 years     BP:  134/48 mmHg Patient Gender: F            HR:           70 bpm. Exam Location:  Inpatient Procedure: 2D Echo Indications:    stroke  History:        Patient has prior history of Echocardiogram examinations, most                 recent 04/05/2017. Chronic kidney disease; Risk                 Factors:Diabetes, Hypertension and Dyslipidemia.  Sonographer:    Johny Chess RDCS Referring Phys: [phone] [place]  1. Left ventricular ejection fraction, by estimation, is 60 to 65%. The left ventricle has normal function. The left ventricle has no regional wall motion abnormalities. Left ventricular diastolic parameters are consistent with Grade I diastolic dysfunction (impaired relaxation). Elevated left atrial pressure.  2. Right ventricular systolic function is normal. The right ventricular size is normal.  3. Left atrial size was  mildly dilated.  4. The mitral valve is abnormal. Trivial mitral valve regurgitation. No evidence of mitral stenosis. Severe mitral annular calcification.  5. The aortic valve is tricuspid. Aortic valve regurgitation is not visualized. Aortic valve sclerosis is present, with no evidence of aortic valve stenosis.  6. The inferior vena cava is normal in size with greater than 50% respiratory variability, suggesting right atrial pressure of 3 mmHg. Comparison(s): Prior images unable to be directly viewed, comparison made by report only. FINDINGS  Left Ventricle: Left ventricular ejection fraction, by estimation, is 60 to 65%. The left ventricle has normal function. The left ventricle has no regional wall motion abnormalities. The left ventricular internal cavity size was normal in size. There is  no left ventricular hypertrophy. Left ventricular diastolic parameters are consistent with Grade I diastolic dysfunction (impaired relaxation). Elevated left atrial pressure. Right Ventricle: The right ventricular size is normal. No increase in right ventricular wall thickness. Right ventricular systolic function is normal. Left Atrium: Left atrial size was mildly dilated. Right Atrium: Right atrial size was normal in size. Pericardium: There is no evidence of pericardial effusion. Mitral Valve: The mitral valve is abnormal. Severe mitral annular calcification. Trivial mitral valve regurgitation. No evidence of mitral valve stenosis. Tricuspid Valve: The tricuspid valve is normal in structure. Tricuspid valve regurgitation is trivial. No evidence of tricuspid stenosis. Aortic Valve: The aortic valve is tricuspid. Aortic valve regurgitation is not visualized. Aortic valve sclerosis is present, with no evidence of aortic valve stenosis. Pulmonic Valve: The pulmonic valve was normal in structure. Pulmonic valve regurgitation is trivial. No evidence of pulmonic stenosis. Aorta: The aortic root is normal in size and structure.  Venous: The inferior vena cava is normal in size with greater than 50% respiratory variability, suggesting right atrial pressure of 3 mmHg. IAS/Shunts: No atrial level shunt detected by color flow Doppler.  LEFT VENTRICLE PLAX 2D LVIDd:         4.30 cm     Diastology LVIDs:         2.50 cm     LV e' medial:    5.77 cm/s LV PW:         1.00 cm     LV E/e' medial:  23.9 LV IVS:        1.00 cm     LV e' lateral:   6.31 cm/s LVOT diam:     1.90 cm     LV E/e'  lateral: 21.9 LV SV:         71 LV SV Index:   36 LVOT Area:     2.84 cm  LV Volumes (MOD) LV vol d, MOD A4C: 80.7 ml LV vol s, MOD A4C: 25.7 ml LV SV MOD A4C:     80.7 ml RIGHT VENTRICLE             IVC RV S prime:     10.80 cm/s  IVC diam: 1.70 cm TAPSE (M-mode): 2.3 cm LEFT ATRIUM             Index        RIGHT ATRIUM           Index LA diam:        3.90 cm 1.99 cm/m   RA Area:     13.40 cm LA Vol (A2C):   81.0 ml 41.41 ml/m  RA Volume:   31.40 ml  16.05 ml/m LA Vol (A4C):   52.4 ml 26.79 ml/m LA Biplane Vol: 65.1 ml 33.28 ml/m  AORTIC VALVE LVOT Vmax:   112.00 cm/s LVOT Vmean:  80.100 cm/s LVOT VTI:    0.250 m  AORTA Ao Root diam: 3.10 cm Ao Asc diam:  3.10 cm MITRAL VALVE MV Area (PHT): 3.08 cm     SHUNTS MV Decel Time: 246 msec     Systemic VTI:  0.25 m MV E velocity: 138.00 cm/s  Systemic Diam: 1.90 cm MV A velocity: 179.00 cm/s MV E/A ratio:  0.77 Kirk Ruths MD Electronically signed by Kirk Ruths MD Signature Date/Time: 03/20/2021/12:00:29 PM    Final    VAS US CAROTID (at [facility] and [facility] only)  Result Date: 03/21/2021 Carotid Arterial Duplex Study Patient Name:  Brandi Melton  Date of Exam:   03/20/2021 Medical Rec #: [id number]     Accession #:    [id number] Date of Birth: May 06, 1930     Patient Gender: F Patient Age:   89 years Exam Location:  [facility] Procedure:      VAS US CAROTID Referring Phys: Rosalin Hawking --------------------------------------------------------------------------------  Indications:       CVA. Risk Factors:       Hypertension, hyperlipidemia, Diabetes. Limitations        Today's exam was limited due to the patient's inability or                    unwillingness to cooperate and the body habitus of the                    patient. Comparison Study:  No prior study Performing Technologist: Maudry Mayhew MHA, RDMS, RVT, RDCS  Examination Guidelines: A complete evaluation includes B-mode imaging, spectral Doppler, color Doppler, and power Doppler as needed of all accessible portions of each vessel. Bilateral testing is considered an integral part of a complete examination. Limited examinations for reoccurring indications may be performed as noted.  Right Carotid Findings: +----------+--------+--------+--------+------------------+---------+           PSV cm/sEDV cm/sStenosisPlaque DescriptionComments  +----------+--------+--------+--------+------------------+---------+ CCA Prox  84      9               heterogenous                +----------+--------+--------+--------+------------------+---------+ CCA Distal98      11                                          +----------+--------+--------+--------+------------------+---------+  ICA Prox  123     11              calcific          Shadowing +----------+--------+--------+--------+------------------+---------+ ICA Distal140     18                                          +----------+--------+--------+--------+------------------+---------+ ECA       182                                                 +----------+--------+--------+--------+------------------+---------+ +----------+--------+-------+----------------+-------------------+           PSV cm/sEDV cmsDescribe        Arm Pressure (mmHG) +----------+--------+-------+----------------+-------------------+ Subclavian160            Multiphasic, WNL                    +----------+--------+-------+----------------+-------------------+ +---------+--------+--+--------+--+---------+  VertebralPSV cm/s72EDV cm/s10Antegrade +---------+--------+--+--------+--+---------+  Left Carotid Findings: +----------+--------+--------+--------+-------------------------+---------+           PSV cm/sEDV cm/sStenosisPlaque Description       Comments  +----------+--------+--------+--------+-------------------------+---------+ CCA Prox  133     18              smooth and heterogenous            +----------+--------+--------+--------+-------------------------+---------+ CCA Distal88      9               heterogenous and calcificShadowing +----------+--------+--------+--------+-------------------------+---------+ ICA Prox  99      16              hyperechoic and calcific Shadowing +----------+--------+--------+--------+-------------------------+---------+ ICA Distal123     22                                                 +----------+--------+--------+--------+-------------------------+---------+ ECA       117     5               hyperechoic and calcific shadowing +----------+--------+--------+--------+-------------------------+---------+ +----------+--------+--------+----------------+-------------------+           PSV cm/sEDV cm/sDescribe        Arm Pressure (mmHG) +----------+--------+--------+----------------+-------------------+ [id number]             Multiphasic, WNL                    +----------+--------+--------+----------------+-------------------+ +---------+--------+--+--------+--+---------+ VertebralPSV cm/s86EDV cm/s16Antegrade +---------+--------+--+--------+--+---------+   Summary: Right Carotid: Velocities in the right ICA are consistent with a 1-39% stenosis. Left Carotid: Velocities in the left ICA are consistent with a 1-39% stenosis. Vertebrals:  Bilateral vertebral arteries demonstrate antegrade flow. Subclavians: Normal flow hemodynamics were seen in bilateral subclavian              arteries. *See table(s) above for  measurements and observations.  Electronically signed by Antony Contras MD on 03/21/2021 at 1:59:46 PM.    Final      CBC Latest Ref Rng & Units 03/23/2021 03/22/2021 03/21/2021  WBC 4.0 - 10.5 K/uL 9.5 9.1 9.7  Hemoglobin 12.0 - 15.0 g/dL 10.9(L) 10.5(L) 10.2(L)  Hematocrit 36.0 - 46.0 % 34.7(L)  34.3(L) 33.0(L)  Platelets 150 - 400 K/uL 201 184 183    BMP Latest Ref Rng & Units 03/23/2021 03/22/2021 03/21/2021  Glucose 70 - 99 mg/dL 201(H) 174(H) 181(H)  BUN 8 - 23 mg/dL 32(H) 34(H) 42(H)  Creatinine 0.44 - 1.00 mg/dL 1.32(H) 1.46(H) 1.39(H)  BUN/Creat Ratio 12 - 28 - - -  Sodium 135 - 145 mmol/L 137 140 136  Potassium 3.5 - 5.1 mmol/L 4.5 4.3 4.2  Chloride 98 - 111 mmol/L 105 105 105  CO2 22 - 32 mmol/L 25 25 24   Calcium 8.9 - 10.3 mg/dL 8.6(L) 8.5(L) 8.5(L)    Discharge Instructions: Discharge Instructions     Call MD for:  difficulty breathing, headache or visual disturbances   Complete by: As directed    Call MD for:  persistant dizziness or light-headedness   Complete by: As directed    Call MD for:  temperature >100.4   Complete by: As directed    Diet - low sodium heart healthy   Complete by: As directed    Increase activity slowly   Complete by: As directed    No wound care   Complete by: As directed        Signed: Scarlett Presto, MD 03/28/2021, 8:41 AM   Pager: [phone]

## 2021-03-25 NOTE — Progress Notes (Signed)
Occupational Therapy Treatment Patient Details Name: Brandi Melton MRN: [id number] DOB: December 28, 1930 Today's Date: 03/25/2021   History of present illness Pt is an 87 y/o female admitted 12/6 secondary to L sided weakness. Imaging showed acute ischemia within the right frontal  operculum, anterior right parietal lobe and posterior right insula, and small infarct in L temporal lobe; L SDH. On 03/16/2021, she had an ED visit for increasing weakness and difficulty walking.  CT no acute abnormality.  Found to have UTI and discharged back to SNF.  03/17/2021, patient again had an ED visit after a fall in the nursing facility hitting her left side of head.  CT no acute abnormality.  Patient discharged back to nursing facility. PMH includes CKD, RA, DM, and dCHF.   OT comments  Brandi Melton is progressing incrementally towards her acute OT goals. Session complete with PT for pt/therapist safety. Brandi Melton required simple step-by-step commands throughout session to assist with all tasks, and stated "I had a stroke" several times. Her LUE is flaccid and required max A for safe positioning and for pt to attend to extremity.  Overall she required max A +2 for bed mobility and fluctuated from min A-max A for sitting balance. Grooming EOB required increased trunk support and one step commands. Once fatigued pt was pushing posteriorly and leaning to the L which created safety concern. Pt will continue to benefit from OT acutely. D/c recommendation remains appropriate.    Recommendations for follow up therapy are one component of a multi-disciplinary discharge planning process, led by the attending physician.  Recommendations may be updated based on patient status, additional functional criteria and insurance authorization.    Follow Up Recommendations  Skilled nursing-short term rehab (<3 hours/day)    Assistance Recommended at Discharge Frequent or constant Supervision/Assistance  Equipment Recommendations  None recommended by  OT       Precautions / Restrictions Precautions Precautions: Fall Precaution Comments: L neglect Restrictions Weight Bearing Restrictions: No       Mobility Bed Mobility Overal bed mobility: Needs Assistance Bed Mobility: Rolling;Sidelying to Sit;Sit to Supine Rolling: +2 for physical assistance;Max assist;+2 for safety/equipment Sidelying to sit: HOB elevated;Max assist;+2 for physical assistance   Sit to supine: Max assist;+2 for physical assistance;+2 for safety/equipment   General bed mobility comments: max A +2 to roll towards the R, max verbal cues for sequencing and assisting flaccid LUE. Max A +2 for sit>supine via hellicopter method with chuck pads. pt requied simple step by step verbal cus throughout.    Transfers Overall transfer level: Needs assistance                 General transfer comment: deferred this session for safety     Balance Overall balance assessment: Needs assistance Sitting-balance support: Feet supported;Feet unsupported;No upper extremity supported Sitting balance-Leahy Scale: Poor Sitting balance - Comments: min-max A for sitting balance with L lateral lean and posterior lean. Safety concerns with pushing hips towards EOB in sitting required max verbal cues.                 ADL either performed or assessed with clinical judgement   ADL Overall ADL's : Needs assistance/impaired     Grooming: Moderate assistance;Sitting Grooming Details (indicate cue type and reason): mod A for sitting balance and verbal cues to sequencing through the task                 Functional mobility during ADLs: Maximal assistance;+2 for physical assistance;+2 for safety/equipment (limited  to bed mobility) General ADL Comments: session spent at EOB working on midline posture and sitting tolerance. Groomed with mod A for verbal cues and assist for sitting balance    Extremity/Trunk Assessment Upper Extremity Assessment Upper Extremity Assessment:  LUE deficits/detail LUE Deficits / Details: flaccid LUE LUE Sensation: decreased light touch;decreased proprioception LUE Coordination: decreased fine motor;decreased gross motor   Lower Extremity Assessment Lower Extremity Assessment: Defer to PT evaluation        Vision   Eye Alignment: Within Functional Limits Ocular Range of Motion: Within Functional Limits Alignment/Gaze Preference: Within Defined Limits Tracking/Visual Pursuits: Decreased smoothness of horizontal tracking;Decreased smoothness of vertical tracking Saccades: Additional head turns occurred during testing;Additional eye shifts occurred during testing Visual Fields: Left visual field deficit   Perception Perception Perception: Not tested   Praxis Praxis Praxis: Not tested    Cognition Arousal/Alertness: Awake/alert Behavior During Therapy: Impulsive Overall Cognitive Status: No family/caregiver present to determine baseline cognitive functioning Area of Impairment: Memory;Orientation;Safety/judgement;Following commands;Problem solving;Awareness;Attention       Current Attention Level: Sustained Memory: Decreased short-term memory;Decreased recall of precautions Following Commands: Follows one step commands with increased time Safety/Judgement: Decreased awareness of safety;Decreased awareness of deficits Awareness: Intellectual Problem Solving: Slow processing;Decreased initiation;Difficulty sequencing General Comments: perseverating on stating she had a stroke. impulsive while sitting EOB which led to safety concerns. Required simple verbal cues for all funcitonal tasks                General Comments VSS on RA, pt with a few open wounds on her back    Pertinent Vitals/ Pain       Pain Assessment: Faces Faces Pain Scale: Hurts a little bit Pain Location: generalized with movement Pain Descriptors / Indicators: Aching;Grimacing Pain Intervention(s): Monitored during session   Frequency  Min  2X/week        Progress Toward Goals  OT Goals(current goals can now be found in the care plan section)  Progress towards OT goals: Progressing toward goals  Acute Rehab OT Goals Patient Stated Goal: stop her back from itching OT Goal Formulation: With patient Time For Goal Achievement: 04/03/21 Potential to Achieve Goals: Good ADL Goals Pt Will Perform Grooming: with min assist Pt Will Perform Upper Body Bathing: with min assist Pt Will Perform Lower Body Bathing: with mod assist Pt Will Perform Lower Body Dressing: with set-up;sit to/from stand Pt Will Transfer to Toilet: with +2 assist;with mod assist;bedside commode  Plan Discharge plan remains appropriate    Co-evaluation    PT/OT/SLP Co-Evaluation/Treatment: Yes Reason for Co-Treatment: Complexity of the patient's impairments (multi-system involvement);For patient/therapist safety;To address functional/ADL transfers   OT goals addressed during session: ADL's and self-care;Proper use of Adaptive equipment and DME      AM-PAC OT "6 Clicks" Daily Activity     Outcome Measure   Help from another person eating meals?: A Lot Help from another person taking care of personal grooming?: A Lot Help from another person toileting, which includes using toliet, bedpan, or urinal?: Total Help from another person bathing (including washing, rinsing, drying)?: A Lot Help from another person to put on and taking off regular upper body clothing?: A Lot Help from another person to put on and taking off regular lower body clothing?: Total 6 Click Score: 10    End of Session    OT Visit Diagnosis: Unsteadiness on feet (R26.81);Other abnormalities of gait and mobility (R26.89);Muscle weakness (generalized) (M62.81);History of falling (Z91.81);Other symptoms and signs involving the nervous system (R29.898);Other symptoms  and signs involving cognitive function;Hemiplegia and hemiparesis Hemiplegia - Right/Left: Left Hemiplegia -  dominant/non-dominant: Non-Dominant Hemiplegia - caused by: Cerebral infarction   Activity Tolerance Patient tolerated treatment well   Patient Left in bed;with bed alarm set;with call bell/phone within reach   Nurse Communication Mobility status        Time: 4715-8063 OT Time Calculation (min): 25 min  Charges: OT General Charges $OT Visit: 1 Visit OT Treatments $Self Care/Home Management : 8-22 mins  Arella Blinder A Ishani Goldwasser 03/25/2021, 10:52 AM

## 2021-03-25 NOTE — Progress Notes (Signed)
HD#5 Subjective:  Overnight Events: NAEO   Patient says that she is doing well this morning. She is enjoying her breakfast. She does not know if she is able to move her left arm much. Otherwise doing well.  Objective:  Vital signs in last 24 hours: Vitals:   03/24/21 1300 03/24/21 1959 03/24/21 2350 03/25/21 0324  BP:  (!) 135/49 (!) 132/50 (!) 117/53  Pulse:  67 68 65  Resp: 17 18 18 17   Temp:  97.9 F (36.6 C) 97.9 F (36.6 C) 98.2 F (36.8 C)  TempSrc:  Axillary Oral Oral  SpO2:  96% 96% 93%  Weight:      Height:       Supplemental O2: Room Air SpO2: 93 %   Physical Exam:  Constitutional: Elderly woman resting in bed comfortably in NAD HENT: unchanged ecchymosis over left eye CV: RRR no m/r/g Pulmonary/Chest: normal work of breathing on room air Abdominal: soft, non-tender, non-distended MSK: normal bulk and tone Neurological: alert, oriented to self, answering questions appropriately, 4/5 strength of proximal LLE, 0/5 of the LUE. Loss of sensation in the LUE Skin: warm and dry Psych: normal affect  Filed Weights   03/19/21 1509  Weight: 90.7 kg     Intake/Output Summary (Last 24 hours) at 03/25/2021 0654 Last data filed at 03/24/2021 1140 Gross per 24 hour  Intake --  Output 225 ml  Net -225 ml   Net IO Since Admission: -3,975 mL [03/25/21 0654]  Pertinent Labs: CBC Latest Ref Rng & Units 03/23/2021 03/22/2021 03/21/2021  WBC 4.0 - 10.5 K/uL 9.5 9.1 9.7  Hemoglobin 12.0 - 15.0 g/dL 10.9(L) 10.5(L) 10.2(L)  Hematocrit 36.0 - 46.0 % 34.7(L) 34.3(L) 33.0(L)  Platelets 150 - 400 K/uL 201 184 183    CMP Latest Ref Rng & Units 03/23/2021 03/22/2021 03/21/2021  Glucose 70 - 99 mg/dL 201(H) 174(H) 181(H)  BUN 8 - 23 mg/dL 32(H) 34(H) 42(H)  Creatinine 0.44 - 1.00 mg/dL 1.32(H) 1.46(H) 1.39(H)  Sodium 135 - 145 mmol/L 137 140 136  Potassium 3.5 - 5.1 mmol/L 4.5 4.3 4.2  Chloride 98 - 111 mmol/L 105 105 105  CO2 22 - 32 mmol/L 25 25 24   Calcium 8.9 -  10.3 mg/dL 8.6(L) 8.5(L) 8.5(L)  Total Protein 6.5 - 8.1 g/dL - - -  Total Bilirubin 0.3 - 1.2 mg/dL - - -  Alkaline Phos 38 - 126 U/L - - -  AST 15 - 41 U/L - - -  ALT 0 - 44 U/L - - -    Imaging: No results found.  Assessment/Plan:   Principal Problem:   Acute cerebral infarction [facility]) Active Problems:   Diabetes mellitus type 2, insulin dependent ([place])   Hypertension   Hyperlipemia   CKD (chronic kidney disease), stage IV ([facility])   AKI (acute kidney injury) ([place])   Subdural hematoma   CVA (cerebral vascular accident) [facility])   Patient Summary: Brandi Melton is a 87 y.o. with a pertinent PMH oftype II DM, hyperlipidemia, hypertension, CKD stage III, gout and diastolic heart failure presenting with aphasia and code stroke.   Acute right parietal lobe infarct [facility] Delirium MRI brain showed acute ischemia within right frontal operculum, anterior right parietal lobe and posterior right insula.  Also a small acute infarct of the left temporal lobe. With the multifocal nature of these infarcts suspect embolic source as etiology. Patient likely first started showing symptoms 12/4. Repeat CT head this hospitalization with expected evolution of acute frontoparietal  and posterior insular infarct.    Given the significant deficits, patient's ability to return to prior functional status will be challenging.has had some intermittent confusion concerning for delirium during this hospitalization.  Although has been oriented for the past 4 days and has not required a sitter.  - Plavix 75 mg daily -- Continue atorvastatin 80 mg daily and Zetia 10 mg daily. - We will continue on lisinopril 5 mg daily - Monitor on telemetry no A. fib - TTE  showed grade 1 diastolic dysfunction with an EF Of 60-65% - Carotid vascular ultrasound showed mild stenosis - Patient is stable for discharge.  Plan to discharge to subacute rehab.  TOC consulted family would like her to discharge to [facility] or [facility]  pending insurance authorization - Neurology signed off   Subdural Hematoma Patient recently sustained multiple mechanical falls (12/3 and 12/4); patient was seen in the ED for both occurences. Imaging on admission 12/6 showed thin left hemispheric subdural hematoma measuring 3 mm on MRI of the brain. Repeat imaging on 12/7 showed grossly stable to minimally increased SDH up to 5 mm with no mass effect. However, was able to speak with neurosurgery who felt SDH is very minimal and likely not causing symptoms and no interventions needed at this time.  --Frequent neuro checks --Avoid DAPT in the setting of SDH   Type 2 DM Most recent hemoglobin A1c s 7.2.  Patient is on insulin aspart 20 mg 3 times daily with meals and insulin glargine 40 units twice daily.  -CBG monitoring -SSI  --NovoLog 5 units 3 times daily with meals --increase Semglee to 6 units daily   Hyperlipidemia LDL on lipid panel 53, previously on moderate intensity statin therapy which was increased to high intensity after admission. -- atorvastatin 80 + zetia 10mg     Hypertension Diastolic heart failure Medications include lisinopril, Coreg, and torsemide. TTE on admission showed normal EF 71-06% grade 1 diastolic dysfunction. -Continue lisinopril 5 mg -Strict ins and outs -Daily weights   AKI on CKD stage IIIb; resolved Creatinine on admission 2.28, improved to patient's baseline.Will need to monitor patient's ability to maintain good PO intake with new deficits. - daily bmp - Avoid nephrotoxic agents   Anemia of CKD Stable.   Stage II sacral pressure injury -Frequent repositioning and barrier cream   Gout -Continue allopurinol 100 mg daily   History of stage II melanoma Status post excision in July of this year.  Margins were clear on pathology review.   RLL lung nodule CT chest during last admission showed stable right lower lobe pulmonary nodules and recommended follow-up noncontrast chest CT in about 6  months.  Largest nodule measuring up to 8 mm.   Anxiety/depression, chronic and stable -Continue Cymbalta    Scarlett Presto, MD Internal Medicine Resident PGY-1 Pager [phone] Please contact the on call pager after 5 pm and on weekends at [phone].

## 2021-03-25 NOTE — Progress Notes (Signed)
Physical Therapy Treatment Patient Details Name: Brandi Melton MRN: [id number] DOB: [birth year] Today's Date: 03/25/2021   History of Present Illness Pt is an 87 y/o female admitted 12/6 secondary to L sided weakness. Imaging showed acute ischemia within the right frontal  operculum, anterior right parietal lobe and posterior right insula, and small infarct in L temporal lobe; L SDH. On 03/16/2021, she had an ED visit for increasing weakness and difficulty walking.  CT no acute abnormality.  Found to have UTI and discharged back to SNF.  03/17/2021, patient again had an ED visit after a fall in the nursing facility hitting her left side of head.  CT no acute abnormality.  Patient discharged back to nursing facility. PMH includes CKD, RA, DM, and dCHF.    PT Comments    Pt progressing towards physical therapy goals. Was able to perform bed mobility with +2 assist for sitting balance and safety. Pt completed ADL while EOB and required increased assist for neutral posture 2 strong posterior lean. Pt began sliding hips out towards EOB while posteriorly leaning and EOB activity terminated for safety. SNF level rehab remains the most appropriate d/c disposition at this time. Recommend OOB to chair with Maximove lift at this time with nursing staff.     Recommendations for follow up therapy are one component of a multi-disciplinary discharge planning process, led by the attending physician.  Recommendations may be updated based on patient status, additional functional criteria and insurance authorization.  Follow Up Recommendations  Skilled nursing-short term rehab (<3 hours/day)     Assistance Recommended at Discharge Frequent or constant Supervision/Assistance  Equipment Recommendations  Wheelchair (measurements PT);Wheelchair cushion (measurements PT);Hospital bed;Other (comment)    Recommendations for Other Services       Precautions / Restrictions Precautions Precautions: Fall Precaution  Comments: L neglect Restrictions Weight Bearing Restrictions: No     Mobility  Bed Mobility Overal bed mobility: Needs Assistance Bed Mobility: Rolling;Sidelying to Sit;Sit to Supine Rolling: +2 for physical assistance;Max assist;+2 for safety/equipment Sidelying to sit: HOB elevated;Max assist;+2 for physical assistance   Sit to supine: Max assist;+2 for physical assistance;+2 for safety/equipment   General bed mobility comments: max A +2 to roll towards the R, max verbal cues for sequencing and assisting flaccid LUE. Max A +2 for sit>supine via hellicopter method with chuck pads. pt requied simple step by step verbal cus throughout.    Transfers Overall transfer level: Needs assistance                 General transfer comment: Unable to progress to transfer training this session.    Ambulation/Gait                   Stairs             Wheelchair Mobility    Modified Rankin (Stroke Patients Only)       Balance Overall balance assessment: Needs assistance Sitting-balance support: Feet supported;Feet unsupported;No upper extremity supported Sitting balance-Leahy Scale: Poor Sitting balance - Comments: min-max A for sitting balance with L lateral lean and posterior lean. Safety concerns with pushing hips towards EOB in sitting required max verbal cues.                                    Cognition Arousal/Alertness: Awake/alert Behavior During Therapy: Impulsive Overall Cognitive Status: No family/caregiver present to determine baseline cognitive functioning Area of Impairment: Memory;Safety/judgement;Following  commands;Problem solving;Awareness;Attention                   Current Attention Level: Sustained Memory: Decreased short-term memory;Decreased recall of precautions Following Commands: Follows one step commands with increased time Safety/Judgement: Decreased awareness of safety;Decreased awareness of  deficits Awareness: Intellectual Problem Solving: Slow processing;Decreased initiation;Difficulty sequencing General Comments: perseverating on stating she had a stroke. impulsive while sitting EOB which led to safety concerns. Required simple verbal cues for all funcitonal tasks        Exercises      General Comments General comments (skin integrity, edema, etc.): VSS on RA, pt with a few open wounds on her back      Pertinent Vitals/Pain Pain Assessment: Faces Faces Pain Scale: Hurts little more Pain Location: generalized with movement Pain Descriptors / Indicators: Aching;Grimacing Pain Intervention(s): Limited activity within patient's tolerance;Monitored during session;Repositioned    Home Living                          Prior Function            PT Goals (current goals can now be found in the care plan section) Acute Rehab PT Goals Patient Stated Goal: None stated this session. PT Goal Formulation: With patient Time For Goal Achievement: 04/03/21 Potential to Achieve Goals: Fair Progress towards PT goals: Progressing toward goals    Frequency    Min 3X/week      PT Plan Current plan remains appropriate    Co-evaluation PT/OT/SLP Co-Evaluation/Treatment: Yes Reason for Co-Treatment: Necessary to address cognition/behavior during functional activity;For patient/therapist safety;To address functional/ADL transfers PT goals addressed during session: Mobility/safety with mobility;Balance;Strengthening/ROM OT goals addressed during session: ADL's and self-care;Proper use of Adaptive equipment and DME      AM-PAC PT "6 Clicks" Mobility   Outcome Measure  Help needed turning from your back to your side while in a flat bed without using bedrails?: A Lot Help needed moving from lying on your back to sitting on the side of a flat bed without using bedrails?: Total Help needed moving to and from a bed to a chair (including a wheelchair)?: Total Help  needed standing up from a chair using your arms (e.g., wheelchair or bedside chair)?: Total Help needed to walk in hospital room?: Total Help needed climbing 3-5 steps with a railing? : Total 6 Click Score: 7    End of Session   Activity Tolerance: Patient tolerated treatment well Patient left: in bed;with call bell/phone within reach;with bed alarm set (in chair position) Nurse Communication: Mobility status PT Visit Diagnosis: Hemiplegia and hemiparesis;Other symptoms and signs involving the nervous system (R29.898);Other abnormalities of gait and mobility (R26.89) Hemiplegia - Right/Left: Left Hemiplegia - dominant/non-dominant: Non-dominant Hemiplegia - caused by: Cerebral infarction     Time: 0851-0920 PT Time Calculation (min) (ACUTE ONLY): 29 min  Charges:  $Therapeutic Activity: 8-22 mins                     Rolinda Roan, PT, DPT Acute Rehabilitation Services Pager: [phone] Office: [phone]    Thelma Comp 03/25/2021, 11:37 AM

## 2021-03-26 LAB — GLUCOSE, CAPILLARY
Glucose-Capillary: 202 mg/dL — ABNORMAL HIGH (ref 70–99)
Glucose-Capillary: 224 mg/dL — ABNORMAL HIGH (ref 70–99)
Glucose-Capillary: 171 mg/dL — ABNORMAL HIGH (ref 70–99)
Glucose-Capillary: 220 mg/dL — ABNORMAL HIGH (ref 70–99)

## 2021-03-26 MED ORDER — INSULIN GLARGINE-YFGN 100 UNIT/ML ~~LOC~~ SOLN
8.0000 [IU] | Freq: Every day | SUBCUTANEOUS | Status: DC
  Administered 2021-03-26 – 2021-03-27 (×2): 8 [IU] via SUBCUTANEOUS
  Filled 2021-03-26 (×3): qty 0.08

## 2021-03-26 NOTE — Progress Notes (Signed)
HD#6 Subjective:  Overnight Events: NAEO   Patient says that she is feeling weak. Describes it as her whole body. She is interested in getting more therapy to help get her feeling stronger.  Objective:  Vital signs in last 24 hours: Vitals:   03/25/21 1548 03/25/21 1953 03/25/21 2350 03/26/21 0420  BP: (!) 118/37 (!) 140/49 (!) 123/56 (!) 133/48  Pulse: 71 72 77 73  Resp: (!) 22 20 19    Temp: 97.7 F (36.5 C) 98.3 F (36.8 C) 98.3 F (36.8 C) 97.7 F (36.5 C)  TempSrc: Oral Oral Oral Oral  SpO2: 96% 98% 94% 94%  Weight:      Height:       Supplemental O2: Room Air SpO2: 94 %   Physical Exam:  Constitutional: Elderly woman resting in bed comfortably in NAD  HENT: unchanged ecchymosis over left eye CV: RRR no m/r/g Pulmonary/Chest: normal work of breathing on room air Abdominal: soft, non-tender, non-distended MSK: normal bulk and tone Neurological: alert, oriented to self, answering questions appropriately, 4/5 strength of proximal LLE, 0/5 of the LUE. Loss of sensation in the LUE Skin: warm and dry Psych: normal affect  Filed Weights   03/19/21 1509  Weight: 90.7 kg     Intake/Output Summary (Last 24 hours) at 03/26/2021 0626 Last data filed at 03/26/2021 0421 Gross per 24 hour  Intake --  Output 1000 ml  Net -1000 ml   Net IO Since Admission: -4,975 mL [03/26/21 0626]  Pertinent Labs: CBC Latest Ref Rng & Units 03/23/2021 03/22/2021 03/21/2021  WBC 4.0 - 10.5 K/uL 9.5 9.1 9.7  Hemoglobin 12.0 - 15.0 g/dL 10.9(L) 10.5(L) 10.2(L)  Hematocrit 36.0 - 46.0 % 34.7(L) 34.3(L) 33.0(L)  Platelets 150 - 400 K/uL 201 184 183    CMP Latest Ref Rng & Units 03/23/2021 03/22/2021 03/21/2021  Glucose 70 - 99 mg/dL 201(H) 174(H) 181(H)  BUN 8 - 23 mg/dL 32(H) 34(H) 42(H)  Creatinine 0.44 - 1.00 mg/dL 1.32(H) 1.46(H) 1.39(H)  Sodium 135 - 145 mmol/L 137 140 136  Potassium 3.5 - 5.1 mmol/L 4.5 4.3 4.2  Chloride 98 - 111 mmol/L 105 105 105  CO2 22 - 32 mmol/L 25 25  24   Calcium 8.9 - 10.3 mg/dL 8.6(L) 8.5(L) 8.5(L)  Total Protein 6.5 - 8.1 g/dL - - -  Total Bilirubin 0.3 - 1.2 mg/dL - - -  Alkaline Phos 38 - 126 U/L - - -  AST 15 - 41 U/L - - -  ALT 0 - 44 U/L - - -    Imaging: No results found.  Assessment/Plan:   Principal Problem:   Acute cerebral infarction [facility]) Active Problems:   Diabetes mellitus type 2, insulin dependent ([place])   Hypertension   Hyperlipemia   CKD (chronic kidney disease), stage IV ([facility])   AKI (acute kidney injury) ([place])   Subdural hematoma   CVA (cerebral vascular accident) [facility])   Patient Summary: Brandi Melton is a 87 y.o. with a pertinent PMH oftype II DM, hyperlipidemia, hypertension, CKD stage III, gout and diastolic heart failure presenting with aphasia and code stroke.   Acute right parietal lobe infarct Hospital Delirium; resolved MRI brain showed acute ischemia within right frontal operculum, anterior right parietal lobe and posterior right insula.  Also a small acute infarct of the left temporal lobe. With the multifocal nature of these infarcts suspect embolic source as etiology. Patient likely first started showing symptoms 12/4. Repeat CT head this hospitalization with expected evolution of  acute frontoparietal and posterior insular infarct. Given the significant deficits, patient's ability to return to prior functional status will be challenging. Has been oriented for the past 5 days with significant improvement in delirium. 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  - Plavix 75 mg daily -- Continue atorvastatin 80 mg daily and Zetia 10 mg daily. - We will continue on lisinopril 5 mg daily - Monitor on telemetry no A. fib - TTE  showed grade 1 diastolic dysfunction with an EF Of 60-65% - Carotid vascular ultrasound showed mild stenosis - Patient is stable for discharge.  Plan to discharge to subacute rehab.  TOC consulted family would like her to discharge to [facility] or [place] pending insurance  authorization - Neurology signed off   Subdural Hematoma Patient recently sustained multiple mechanical falls (12/3 and 12/4); patient was seen in the ED for both occurences. Imaging on admission 12/6 showed thin left hemispheric subdural hematoma measuring 3 mm on MRI of the brain. Repeat imaging on 12/7 showed grossly stable to minimally increased SDH up to 5 mm with no mass effect. However, was able to speak with neurosurgery who felt SDH is very minimal and likely not causing symptoms and no interventions needed at this time.  --Frequent neuro checks --Avoid DAPT in the setting of SDH   Type 2 DM Most recent hemoglobin A1c s 7.2.  Patient is on insulin aspart 20 mg 3 times daily with meals and insulin glargine 40 units twice daily.  -CBG monitoring -SSI  --NovoLog 5 units 3 times daily with meals --increase Semglee to 6 units daily   Hyperlipidemia LDL on lipid panel 53, previously on moderate intensity statin therapy which was increased to high intensity after admission. -- atorvastatin 80 + zetia 10mg     Hypertension Diastolic heart failure Medications include lisinopril, Coreg, and torsemide. TTE on admission showed normal EF 41-63% grade 1 diastolic dysfunction. -Continue lisinopril 5 mg -Strict ins and outs -Daily weights   AKI on CKD stage IIIb; resolved Creatinine on admission 2.28, improved to patient's baseline.Will need to monitor patient's ability to maintain good PO intake with new deficits. - daily bmp - Avoid nephrotoxic agents   Anemia of CKD Stable.   Stage II sacral pressure injury -Frequent repositioning and barrier cream   Gout -Continue allopurinol 100 mg daily   History of stage II melanoma Status post excision in July of this year.  Margins were clear on pathology review.   RLL lung nodule CT chest during last admission showed stable right lower lobe pulmonary nodules and recommended follow-up noncontrast chest CT in about 6 months.  Largest  nodule measuring up to 8 mm.   Anxiety/depression, chronic and stable -Continue Cymbalta  Scarlett Presto, MD Internal Medicine Resident PGY-1 Pager [phone] Please contact the on call pager after 5 pm and on weekends at [phone].

## 2021-03-26 NOTE — Plan of Care (Signed)
  Problem: Education: Goal: Knowledge of disease or condition will improve Outcome: Progressing Goal: Knowledge of secondary prevention will improve (SELECT ALL) Outcome: Progressing   Problem: Coping: Goal: Will verbalize positive feelings about self Outcome: Progressing   Problem: Health Behavior/Discharge Planning: Goal: Ability to manage health-related needs will improve Outcome: Progressing   Problem: Self-Care: Goal: Ability to participate in self-care as condition permits will improve Outcome: Progressing   Problem: Ischemic Stroke/TIA Tissue Perfusion: Goal: Complications of ischemic stroke/TIA will be minimized Outcome: Progressing

## 2021-03-26 NOTE — Progress Notes (Signed)
Speech Language Pathology Treatment: Cognitive-Linquistic  Patient Details Name: Brandi Melton MRN: [id number] DOB: [birth year] Today's Date: 03/26/2021 Time: 1200-1227 SLP Time Calculation (min) (ACUTE ONLY): 27 min  Assessment / Plan / Recommendation Clinical Impression  Pt seen at bedside for cognitive-linguistic therapy. Pt was resting in bed, awake and alert. Pt reports feeling better today. Her daughter was present for some of this session. Pt continues to be disoriented, stating the year correctly, the month as November, and the current location as "[facility]" in [place]. Pt demonstrated good problem solving to locate her call bell, and was able to identify how to call the nurse. Pt read "call don't fall" on wall sign, but had difficulty verbalizing what this meant. This was explained to pt. Pt exhibited good recall of family plans for a wedding next September in [place]. She had no recollection of PT/OT visit yesterday, but did remember what she had for breakfast. Will continue to follow pt acutely for cognitive linguistic treatment. Continued ST intervention will be beneficial after acute hospitalization to maximize safety and independence.   HPI HPI: Pt is an 87 y/o female admitted 12/6 secondary to L sided weakness. Imaging showed acute ischemia within the right frontal  operculum, anterior right parietal lobe and posterior right insula, and small infarct in L temporal lobe; L SDH. On 03/16/2021, she had an ED visit for increasing weakness and difficulty walking.  CT no acute abnormality.  Found to have UTI and discharged back to SNF.  03/17/2021, patient again had an ED visit after a fall in the nursing facility hitting her left side of head.  CT no acute abnormality.  Patient discharged back to nursing facility. PMH includes CKD, RA, DM, and dCHF.      SLP Plan  Continue with current plan of care      Recommendations for follow up therapy are one component of a multi-disciplinary  discharge planning process, led by the attending physician.  Recommendations may be updated based on patient status, additional functional criteria and insurance authorization.    Recommendations   Continue per plan of care                Follow Up Recommendations: Skilled nursing-short term rehab (<3 hours/day) Assistance recommended at discharge: Frequent or constant Supervision/Assistance SLP Visit Diagnosis: Cognitive communication deficit (N05.397) Plan: Continue with current plan of care       [place]. Quentin Ore, [facility], [place] Speech Language Pathologist Office: [phone] Shonna Chock  03/26/2021, 12:29 PM

## 2021-03-27 LAB — GLUCOSE, CAPILLARY
Glucose-Capillary: 187 mg/dL — ABNORMAL HIGH (ref 70–99)
Glucose-Capillary: 178 mg/dL — ABNORMAL HIGH (ref 70–99)
Glucose-Capillary: 242 mg/dL — ABNORMAL HIGH (ref 70–99)
Glucose-Capillary: 225 mg/dL — ABNORMAL HIGH (ref 70–99)

## 2021-03-27 LAB — RESP PANEL BY RT-PCR (FLU A&B, COVID) ARPGX2
Influenza A by PCR: NEGATIVE
Influenza B by PCR: NEGATIVE
SARS Coronavirus 2 by RT PCR: NEGATIVE

## 2021-03-27 NOTE — Progress Notes (Signed)
Physical Therapy Treatment Patient Details Name: Brandi Melton MRN: [id number] DOB: [birth year] Today's Date: 03/27/2021   History of Present Illness Pt is an 87 y/o female admitted 12/6 secondary to L sided weakness. Imaging showed acute ischemia within the right frontal  operculum, anterior right parietal lobe and posterior right insula, and small infarct in L temporal lobe; L SDH. On 03/16/2021, she had an ED visit for increasing weakness and difficulty walking.  CT no acute abnormality.  Found to have UTI and discharged back to SNF. 03/17/2021, patient again had an ED visit after a fall in the nursing facility hitting her left side of head. CT no acute abnormality. Patient discharged back to nursing facility. PMH includes CKD, RA, DM, and dCHF.    PT Comments    Pt received in supine, agreeable to therapy session and pleasantly cooperative throughout. Pt able to attend to L side with max cues and much time spent on encouraging pt to actively assist LUE while repositioning in bed using RUE and supine UE/LE exercises for strength/ROM. Pt needing +2 maxA for transition to/from long sit in bed and max to totalA +2 for rolling/posterior scooting with bed rails. Pt continues to benefit from PT services to progress toward functional mobility goals. Continue to recommend SNF.   Recommendations for follow up therapy are one component of a multi-disciplinary discharge planning process, led by the attending physician.  Recommendations may be updated based on patient status, additional functional criteria and insurance authorization.  Follow Up Recommendations  Skilled nursing-short term rehab (<3 hours/day)     Assistance Recommended at Discharge Frequent or constant Supervision/Assistance  Equipment Recommendations  Wheelchair (measurements PT);Wheelchair cushion (measurements PT);Hospital bed;Other (comment)    Recommendations for Other Services       Precautions / Restrictions  Precautions Precautions: Fall Precaution Comments: L neglect Restrictions Weight Bearing Restrictions: No     Mobility  Bed Mobility Overal bed mobility: Needs Assistance Bed Mobility: Rolling;Sit to Supine;Supine to Sit Rolling: +2 for physical assistance;Max assist;+2 for safety/equipment;Total assist   Supine to sit: Max assist;+2 for physical assistance;HOB elevated Sit to supine: Max assist;+2 for physical assistance;+2 for safety/equipment   General bed mobility comments: max A +2 to roll towards the L, max verbal cues for sequencing and totalA toward R with assisting flaccid LUE. Max A +2 for supine to long sit after HOB elevated and +2 trunk assist with BLE down in chair position in bed. Pt able to grip bed side rail with RUE to assist and able to progress to +1 maxA trunk support briefly in long sit. Pt fatigues after ~3 mins sitting upright with increased posterior lean and returned to supine. +2 maxA for posterior supine scoot toward HOB with bed in trendelenburg and pt assisting with RUE/LE and overhead rail.    Transfers   General transfer comment: Unable to progress to transfer training this session.         Modified Rankin (Stroke Patients Only) Modified Rankin (Stroke Patients Only) Pre-Morbid Rankin Score: Moderately severe disability Modified Rankin: Severe disability     Balance Overall balance assessment: Needs assistance Sitting-balance support: Feet supported;Feet unsupported;No upper extremity supported Sitting balance-Leahy Scale: Poor Sitting balance - Comments: max A for long sitting balance in bed with posterior lean. Defer transition to EOB due to limited +2 assist (tech in room a few minutes to wash her back while PTA helping pt to sit up in bed).          Cognition Arousal/Alertness: Awake/alert  Behavior During Therapy: Impulsive Overall Cognitive Status: No family/caregiver present to determine baseline cognitive functioning Area of  Impairment: Memory;Safety/judgement;Following commands;Problem solving;Awareness;Attention     Current Attention Level: Sustained Memory: Decreased short-term memory;Decreased recall of precautions Following Commands: Follows one step commands with increased time Safety/Judgement: Decreased awareness of safety;Decreased awareness of deficits Awareness: Intellectual Problem Solving: Slow processing;Decreased initiation;Difficulty sequencing;Requires verbal cues;Requires tactile cues General Comments: Required increased time and simple verbal cues for most functional tasks, at times needing multimodal cues due to poor understanding of verbal cues/poor body mechanics.        Exercises Other Exercises Other Exercises: supine BLE A/AAROM: ankle pumps (AROM on R), heel slides, hip abduction, SAQ x10 reps ea (AA on L side, AROM on R side) Other Exercises: supine BUE A/AAROM: wrist/elbow/shoulder flex/ext x10 reps ea (AA on LUE AROM on RUE)    General Comments General comments (skin integrity, edema, etc.): VSS on RA, no acute s/sx distress      Pertinent Vitals/Pain Pain Assessment: No/denies pain Pain Intervention(s): Monitored during session;Repositioned;Limited activity within patient's tolerance     PT Goals (current goals can now be found in the care plan section) Acute Rehab PT Goals Patient Stated Goal: None stated this session. PT Goal Formulation: With patient Time For Goal Achievement: 04/03/21 Progress towards PT goals: Progressing toward goals    Frequency    Min 3X/week      PT Plan Current plan remains appropriate       AM-PAC PT "6 Clicks" Mobility   Outcome Measure  Help needed turning from your back to your side while in a flat bed without using bedrails?: Total Help needed moving from lying on your back to sitting on the side of a flat bed without using bedrails?: Total Help needed moving to and from a bed to a chair (including a wheelchair)?: Total Help  needed standing up from a chair using your arms (e.g., wheelchair or bedside chair)?: Total Help needed to walk in hospital room?: Total Help needed climbing 3-5 steps with a railing? : Total 6 Click Score: 6    End of Session   Activity Tolerance: Patient tolerated treatment well Patient left: in bed;with call bell/phone within reach;with bed alarm set (bed in chair position, B heels floated, cushion under Rt hip to offload) Nurse Communication: Mobility status;Need for lift equipment (mechanical lift to recliner for OOB) PT Visit Diagnosis: Hemiplegia and hemiparesis;Other symptoms and signs involving the nervous system (R29.898);Other abnormalities of gait and mobility (R26.89) Hemiplegia - Right/Left: Left Hemiplegia - dominant/non-dominant: Non-dominant Hemiplegia - caused by: Cerebral infarction     Time: 1041-1110 PT Time Calculation (min) (ACUTE ONLY): 29 min  Charges:  $Therapeutic Exercise: 8-22 mins $Therapeutic Activity: 8-22 mins                     Mava Suares P., PTA Acute Rehabilitation Services Pager: [phone] Office: [place] 03/27/2021, 2:56 PM

## 2021-03-27 NOTE — Progress Notes (Addendum)
HD#7 Subjective:  Overnight Events: NAEO   NAEO. Breakfast was good today. Her family was here to visit her yesterday.  Objective:  Vital signs in last 24 hours: Vitals:   03/26/21 1136 03/26/21 1941 03/26/21 2316 03/27/21 0319  BP: (!) 132/51 (!) 125/42 (!) 133/49 (!) 139/48  Pulse: 74 66 66 69  Resp: 14 18 19 19   Temp: 98.6 F (37 C) 98.3 F (36.8 C) 98.3 F (36.8 C) 98.2 F (36.8 C)  TempSrc: Oral Oral Oral Oral  SpO2: 100% 95% 99% 96%  Weight:      Height:       Supplemental O2: Room Air SpO2: 96 %   Physical Exam:  Constitutional: Elderly woman resting in bed comfortably in NAD  HENT: unchanged ecchymosis over left eye CV: RRR no m/r/g Pulmonary/Chest: normal work of breathing on room air Abdominal: soft, non-tender, non-distended MSK: normal bulk and tone Neurological: alert, oriented to self, answering questions appropriately, 4/5 strength of proximal LLE, trace movement of the distal LUE, 2/5 lateral movement of the proximal LUE. Loss of sensation in the LUE Skin: warm and dry Psych: normal affect  Filed Weights   03/19/21 1509  Weight: 90.7 kg     Intake/Output Summary (Last 24 hours) at 03/27/2021 0659 Last data filed at 03/27/2021 0330 Gross per 24 hour  Intake --  Output 950 ml  Net -950 ml   Net IO Since Admission: -5,925 mL [03/27/21 0659]  Pertinent Labs: CBC Latest Ref Rng & Units 03/23/2021 03/22/2021 03/21/2021  WBC 4.0 - 10.5 K/uL 9.5 9.1 9.7  Hemoglobin 12.0 - 15.0 g/dL 10.9(L) 10.5(L) 10.2(L)  Hematocrit 36.0 - 46.0 % 34.7(L) 34.3(L) 33.0(L)  Platelets 150 - 400 K/uL 201 184 183    CMP Latest Ref Rng & Units 03/23/2021 03/22/2021 03/21/2021  Glucose 70 - 99 mg/dL 201(H) 174(H) 181(H)  BUN 8 - 23 mg/dL 32(H) 34(H) 42(H)  Creatinine 0.44 - 1.00 mg/dL 1.32(H) 1.46(H) 1.39(H)  Sodium 135 - 145 mmol/L 137 140 136  Potassium 3.5 - 5.1 mmol/L 4.5 4.3 4.2  Chloride 98 - 111 mmol/L 105 105 105  CO2 22 - 32 mmol/L 25 25 24   Calcium 8.9  - 10.3 mg/dL 8.6(L) 8.5(L) 8.5(L)  Total Protein 6.5 - 8.1 g/dL - - -  Total Bilirubin 0.3 - 1.2 mg/dL - - -  Alkaline Phos 38 - 126 U/L - - -  AST 15 - 41 U/L - - -  ALT 0 - 44 U/L - - -    Imaging: No results found.  Assessment/Plan:   Principal Problem:   Acute cerebral infarction [facility]) Active Problems:   Diabetes mellitus type 2, insulin dependent ([place])   Hypertension   Hyperlipemia   CKD (chronic kidney disease), stage IV (HCC)   AKI (acute kidney injury) ([place])   Subdural hematoma   CVA (cerebral vascular accident) [facility])   Patient Summary: Brandi Melton is a 87 y.o. with a pertinent PMH oftype II DM, hyperlipidemia, hypertension, CKD stage III, gout and diastolic heart failure presenting with aphasia and code stroke.   Acute right parietal lobe infarct Hospital Delirium; resolved MRI brain showed acute ischemia within right frontal operculum, anterior right parietal lobe and posterior right insula.  Also a small acute infarct of the left temporal lobe. With the multifocal nature of these infarcts suspect embolic source as etiology. Patient likely first started showing symptoms 12/4. Repeat CT head this hospitalization with expected evolution of acute frontoparietal and posterior insular  infarct. Given the significant deficits, patient's ability to return to prior functional status will be challenging. Has been oriented for the past 5 days with significant improvement in delirium. 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 Social work has not heard back yet but hoping for word today.   - Plavix 75 mg daily -- Continue atorvastatin 80 mg daily and Zetia 10 mg daily. - We will continue on lisinopril 5 mg daily - Monitor on telemetry no A. fib - TTE  showed grade 1 diastolic dysfunction with an EF Of 60-65% - Carotid vascular ultrasound showed mild stenosis - Patient is stable for discharge.  Plan to discharge to subacute rehab.  TOC consulted family would like her to discharge to  [facility] or [place] pending insurance authorization - Neurology signed off   Subdural Hematoma Patient recently sustained multiple mechanical falls (12/3 and 12/4); patient was seen in the ED for both occurences. Imaging on admission 12/6 showed thin left hemispheric subdural hematoma measuring 3 mm on MRI of the brain. Repeat imaging on 12/7 showed grossly stable to minimally increased SDH up to 5 mm with no mass effect. However, was able to speak with neurosurgery who felt SDH is very minimal and likely not causing symptoms and no interventions needed at this time.  --Frequent neuro checks --Avoid DAPT in the setting of SDH   Type 2 DM Most recent hemoglobin A1c s 7.2.  Patient is on insulin aspart 20 mg 3 times daily with meals and insulin glargine 40 units twice daily.  -CBG monitoring -SSI  --NovoLog 5 units 3 times daily with meals --increase Semglee to 6 units daily   Hyperlipidemia LDL on lipid panel 53, previously on moderate intensity statin therapy which was increased to high intensity after admission. -- atorvastatin 80 + zetia 10mg     Hypertension Diastolic heart failure Medications include lisinopril, Coreg, and torsemide. TTE on admission showed normal EF 70-01% grade 1 diastolic dysfunction. -Continue lisinopril 5 mg -Strict ins and outs -Daily weights   AKI on CKD stage IIIb; resolved Creatinine on admission 2.28, improved to patient's baseline.Will need to monitor patient's ability to maintain good PO intake with new deficits. - daily bmp - Avoid nephrotoxic agents   Anemia of CKD Stable.   Stage II sacral pressure injury -Frequent repositioning and barrier cream   Gout -Continue allopurinol 100 mg daily   History of stage II melanoma Status post excision in July of this year.  Margins were clear on pathology review.   RLL lung nodule CT chest during last admission showed stable right lower lobe pulmonary nodules and recommended follow-up noncontrast  chest CT in about 6 months.  Largest nodule measuring up to 8 mm.   Anxiety/depression, chronic and stable -Continue Cymbalta    Scarlett Presto, MD Internal Medicine Resident PGY-1 Pager [phone] Please contact the on call pager after 5 pm and on weekends at [phone].

## 2021-03-27 NOTE — Plan of Care (Signed)
°  Problem: Education: °Goal: Knowledge of disease or condition will improve °Outcome: Progressing °Goal: Knowledge of secondary prevention will improve (SELECT ALL) °Outcome: Progressing °Goal: Knowledge of patient specific risk factors will improve (INDIVIDUALIZE FOR PATIENT) °Outcome: Progressing °Goal: Individualized Educational Video(s) °Outcome: Progressing °  °Problem: Coping: °Goal: Will verbalize positive feelings about self °Outcome: Progressing °Goal: Will identify appropriate support needs °Outcome: Progressing °  °Problem: Health Behavior/Discharge Planning: °Goal: Ability to manage health-related needs will improve °Outcome: Progressing °  °Problem: Self-Care: °Goal: Ability to participate in self-care as condition permits will improve °Outcome: Progressing °Goal: Verbalization of feelings and concerns over difficulty with self-care will improve °Outcome: Progressing °Goal: Ability to communicate needs accurately will improve °Outcome: Progressing °  °Problem: Nutrition: °Goal: Risk of aspiration will decrease °Outcome: Progressing °Goal: Dietary intake will improve °Outcome: Progressing °  °Problem: Ischemic Stroke/TIA Tissue Perfusion: °Goal: Complications of ischemic stroke/TIA will be minimized °Outcome: Progressing °  °Problem: Education: °Goal: Knowledge of General Education information will improve °Description: Including pain rating scale, medication(s)/side effects and non-pharmacologic comfort measures °Outcome: Progressing °  °Problem: Health Behavior/Discharge Planning: °Goal: Ability to manage health-related needs will improve °Outcome: Progressing °  °Problem: Clinical Measurements: °Goal: Ability to maintain clinical measurements within normal limits will improve °Outcome: Progressing °Goal: Will remain free from infection °Outcome: Progressing °Goal: Diagnostic test results will improve °Outcome: Progressing °Goal: Respiratory complications will improve °Outcome: Progressing °Goal:  Cardiovascular complication will be avoided °Outcome: Progressing °  °Problem: Activity: °Goal: Risk for activity intolerance will decrease °Outcome: Progressing °  °Problem: Nutrition: °Goal: Adequate nutrition will be maintained °Outcome: Progressing °  °Problem: Coping: °Goal: Level of anxiety will decrease °Outcome: Progressing °  °Problem: Elimination: °Goal: Will not experience complications related to bowel motility °Outcome: Progressing °Goal: Will not experience complications related to urinary retention °Outcome: Progressing °  °Problem: Pain Managment: °Goal: General experience of comfort will improve °Outcome: Progressing °  °Problem: Safety: °Goal: Ability to remain free from injury will improve °Outcome: Progressing °  °Problem: Skin Integrity: °Goal: Risk for impaired skin integrity will decrease °Outcome: Progressing °  °

## 2021-03-27 NOTE — TOC Progression Note (Signed)
Transition of Care [facility]) - Progression Note    Patient Details  Name: Brandi Melton MRN: [id number] Date of Birth: 1930-08-31  Transition of Care [facility]) CM/SW [place], [place] Phone Number: 03/27/2021, 3:43 PM  Clinical Narrative:   CSW notified that insurance has been approved for patient to admit to [facility], but too late to admit today. CSW to follow for admission to [facility] tomorrow morning.     Expected Discharge Plan: [place] Barriers to Discharge: Continued Medical Work up, Ship broker  Expected Discharge Plan and Services Expected Discharge Plan: [place] In-house Referral: Clinical Social Work   Post Acute Care Choice: [place] Living arrangements for the past 2 months: [place]                                       Social Determinants of Health (SDOH) Interventions    Readmission Risk Interventions No flowsheet data found.

## 2021-03-28 LAB — GLUCOSE, CAPILLARY: Glucose-Capillary: 202 mg/dL — ABNORMAL HIGH (ref 70–99)

## 2021-03-28 MED ORDER — CALCIUM CARBONATE ANTACID 500 MG PO CHEW: 1.0000 | CHEWABLE_TABLET | Freq: Every day | ORAL | 0 refills | Status: AC

## 2021-03-28 MED ORDER — INSULIN ASPART 100 UNIT/ML FLEXPEN: 5.0000 [IU] | PEN_INJECTOR | Freq: Three times a day (TID) | SUBCUTANEOUS | 11 refills | Status: AC

## 2021-03-28 MED ORDER — INSULIN GLARGINE 100 UNIT/ML SOLOSTAR PEN: 8.0000 [IU] | PEN_INJECTOR | Freq: Two times a day (BID) | SUBCUTANEOUS | 11 refills | Status: AC

## 2021-03-28 MED ORDER — CLOPIDOGREL BISULFATE 75 MG PO TABS: 75.0000 mg | ORAL_TABLET | Freq: Every day | ORAL | 0 refills | Status: AC

## 2021-03-28 NOTE — Discharge Instructions (Addendum)
Brandi Melton  You were recently admitted to [facility] for a stroke. You have weakness on the left side of your body and you should continue to work with the physical therapists at [facility].   Continue taking your home medications with the following changes  Start taking plavix 75 mg daily Stop taking aspirin, potassium, torsemide   You should seek further medical care if you have worsening of your weakness, new facial droop, slurred speech, or confusion.  We recommend that you see your primary care doctor in about a week to make sure that you continue to improve. We are so glad that you are feeling better.  Sincerely, Scarlett Presto, MD

## 2021-03-28 NOTE — Progress Notes (Signed)
Speech Language Pathology Treatment: Cognitive-Linquistic  Patient Details Name: Brandi Melton MRN: [id number] DOB: 14-Oct-1930 Today's Date: 03/28/2021 Time: 1202-1210 SLP Time Calculation (min) (ACUTE ONLY): 8 min  Assessment / Plan / Recommendation Clinical Impression  Pt was seen for skilled ST targeting orientation and safety judgement.  Pt was encountered awake/alert, confused in bed.  Pt required cues and redirection to assist with organizing the steps for safe bladder and bowel emptying.  She was oriented to herself and partially to her location.  She was able to state that she was in the hospital, but she was unable to name the specific hospital.  She was re-oriented to her current location and to her impending discharge location.  Recommend continued ST at next level of care and frequent supervision and assistance for ADLs and IADLs at time of discharge.     HPI HPI: Pt is an 87 y/o female admitted 12/6 secondary to L sided weakness. Imaging showed acute ischemia within the right frontal  operculum, anterior right parietal lobe and posterior right insula, and small infarct in L temporal lobe; L SDH. On 03/16/2021, she had an ED visit for increasing weakness and difficulty walking.  CT no acute abnormality.  Found to have UTI and discharged back to SNF.  03/17/2021, patient again had an ED visit after a fall in the nursing facility hitting her left side of head.  CT no acute abnormality.  Patient discharged back to nursing facility. PMH includes CKD, RA, DM, and dCHF.      SLP Plan  Continue with current plan of care      Recommendations for follow up therapy are one component of a multi-disciplinary discharge planning process, led by the attending physician.  Recommendations may be updated based on patient status, additional functional criteria and insurance authorization.    Recommendations                  Oral Care Recommendations: Oral care BID;Staff/trained caregiver to  provide oral care Follow Up Recommendations: Skilled nursing-short term rehab (<3 hours/day) Assistance recommended at discharge: Frequent or constant Supervision/Assistance SLP Visit Diagnosis: Cognitive communication deficit (T70.017) Plan: Continue with current plan of care          Brandi Melton M.S., [place] Office: [phone]  [place]  03/28/2021, 12:16 PM

## 2021-03-28 NOTE — Progress Notes (Signed)
Handoff report given to RN Guerry Minors at receiving facility, discharge paperwork sent with [facility].

## 2021-03-28 NOTE — TOC Transition Note (Signed)
Transition of Care [facility]) - CM/SW Discharge Note   Patient Details  Name: Brandi Melton MRN: [id number] Date of Birth: April 13, 1931  Transition of Care [facility]) CM/SW Contact:  Geralynn Ochs, LCSW Phone Number: 03/28/2021, 10:23 AM   Clinical Narrative:   Nurse to call report to [phone].  Transport scheduled for 11:30 AM.    Final next level of care: [place] Barriers to Discharge: Barriers Resolved   Patient Goals and CMS Choice Patient states their goals for this hospitalization and ongoing recovery are:: Pt is disoriented at this time and unable to participate in goal setting. CMS Medicare.gov Compare Post Acute Care list provided to:: Patient Represenative (must comment) (Daughter)    Discharge Placement              Patient chooses bed at: [place] Patient to be transferred to facility by: [facility] Name of family member notified: [name] Patient and family notified of of transfer: 03/28/21  Discharge Plan and Services In-house Referral: Clinical Social Work   Post Acute Care Choice: [place]                               Social Determinants of Health (SDOH) Interventions     Readmission Risk Interventions No flowsheet data found.

## 2021-03-28 NOTE — Plan of Care (Signed)
°  Problem: Education: Goal: Knowledge of disease or condition will improve Outcome: Adequate for Discharge Goal: Knowledge of secondary prevention will improve (SELECT ALL) Outcome: Adequate for Discharge Goal: Knowledge of patient specific risk factors will improve (INDIVIDUALIZE FOR PATIENT) Outcome: Adequate for Discharge Goal: Individualized Educational Video(s) Outcome: Adequate for Discharge   Problem: Coping: Goal: Will verbalize positive feelings about self Outcome: Adequate for Discharge Goal: Will identify appropriate support needs Outcome: Adequate for Discharge   Problem: Health Behavior/Discharge Planning: Goal: Ability to manage health-related needs will improve Outcome: Adequate for Discharge   Problem: Self-Care: Goal: Ability to participate in self-care as condition permits will improve Outcome: Adequate for Discharge Goal: Verbalization of feelings and concerns over difficulty with self-care will improve Outcome: Adequate for Discharge Goal: Ability to communicate needs accurately will improve Outcome: Adequate for Discharge   Problem: Nutrition: Goal: Risk of aspiration will decrease Outcome: Adequate for Discharge Goal: Dietary intake will improve Outcome: Adequate for Discharge   Problem: Ischemic Stroke/TIA Tissue Perfusion: Goal: Complications of ischemic stroke/TIA will be minimized Outcome: Adequate for Discharge   Problem: Education: Goal: Knowledge of General Education information will improve Description: Including pain rating scale, medication(s)/side effects and non-pharmacologic comfort measures Outcome: Adequate for Discharge   Problem: Health Behavior/Discharge Planning: Goal: Ability to manage health-related needs will improve Outcome: Adequate for Discharge   Problem: Clinical Measurements: Goal: Ability to maintain clinical measurements within normal limits will improve Outcome: Adequate for Discharge Goal: Will remain free from  infection Outcome: Adequate for Discharge Goal: Diagnostic test results will improve Outcome: Adequate for Discharge Goal: Respiratory complications will improve Outcome: Adequate for Discharge Goal: Cardiovascular complication will be avoided Outcome: Adequate for Discharge   Problem: Activity: Goal: Risk for activity intolerance will decrease Outcome: Adequate for Discharge   Problem: Nutrition: Goal: Adequate nutrition will be maintained Outcome: Adequate for Discharge   Problem: Elimination: Goal: Will not experience complications related to bowel motility Outcome: Adequate for Discharge Goal: Will not experience complications related to urinary retention Outcome: Adequate for Discharge   Problem: Pain Managment: Goal: General experience of comfort will improve Outcome: Adequate for Discharge   Problem: Safety: Goal: Ability to remain free from injury will improve Outcome: Adequate for Discharge   Problem: Skin Integrity: Goal: Risk for impaired skin integrity will decrease Outcome: Adequate for Discharge

## 2021-04-03 IMAGING — CT CT HEAD WITHOUT CONTRAST
5 of 8 series · 17 of 47 positions shown, 18 images · non-contrast
Comparison: Prior head CT 12/28/2017

CLINICAL DATA: 87-year-old female status post fall last night

EXAM:
CT HEAD WITHOUT CONTRAST
CT CERVICAL SPINE WITHOUT CONTRAST
TECHNIQUE: Multidetector CT imaging of the head and cervical spine was
performed following the standard protocol without intravenous
contrast. Multiplanar CT image reconstructions of the cervical spine
were also generated.

[Series 3: head wo · axial · 0.39mm/px · z∈[-134,-44]mm · 3 of 37 slices shown, 4 images]
[im 10/37  brain]
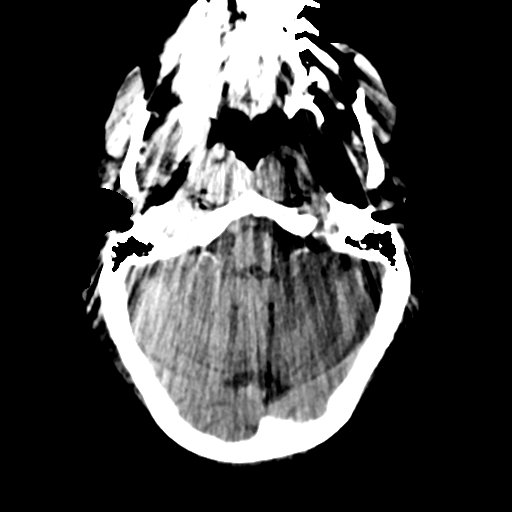
[im 10/37  bone]
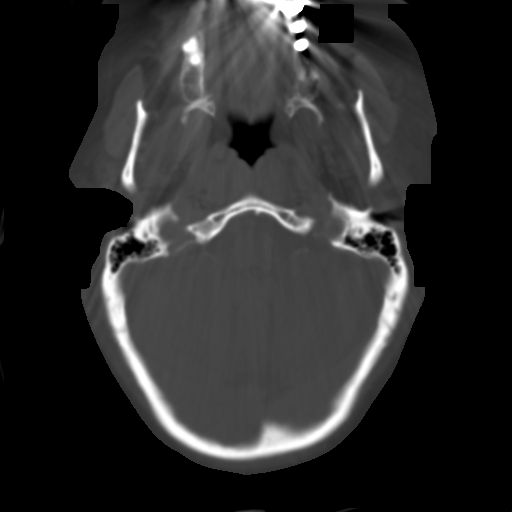
[im 19/37  brain]
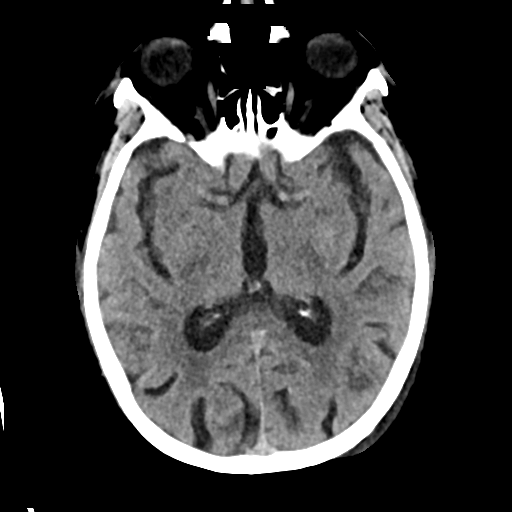
[im 28/37  brain]
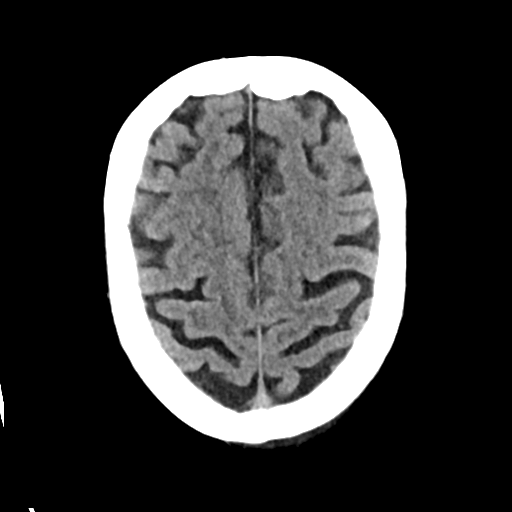

[Series 4: head bone · axial · 0.39mm/px · z∈[-157,-113]mm · 3 of 91 slices shown]
[im 12/91  bone]
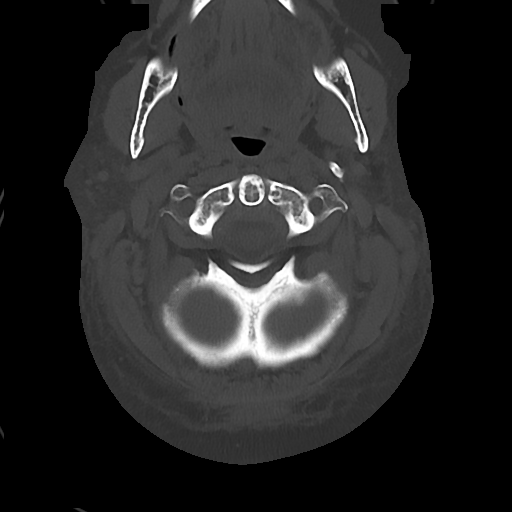
[im 23/91  bone]
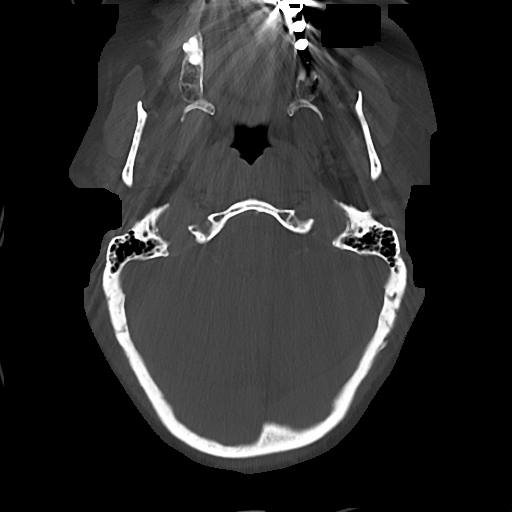
[im 34/91  bone]
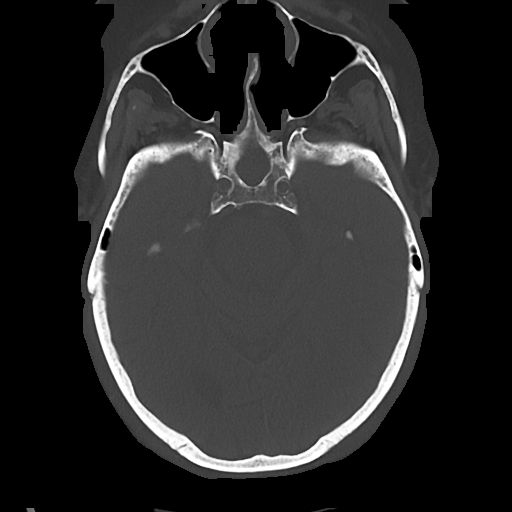

[Series 5: cor soft · coronal · 0.33mm/px · 3 of 67 slices shown]
[im 23/67  brain]
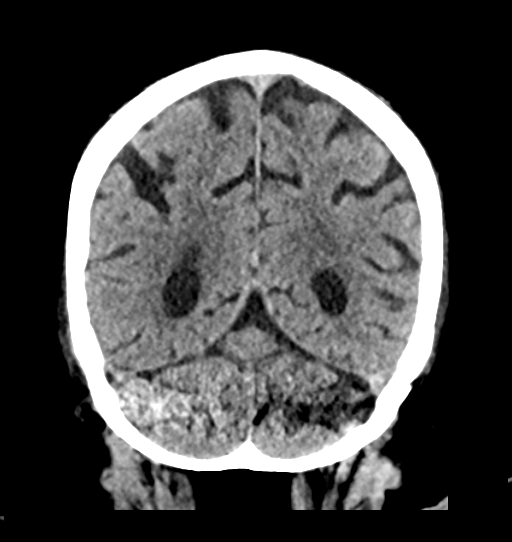
[im 34/67  brain]
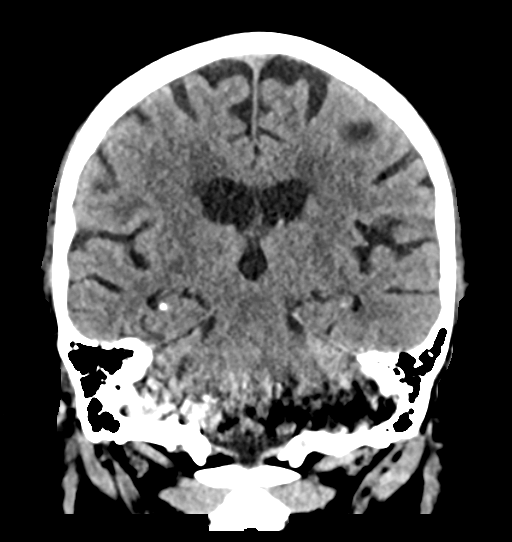
[im 45/67  brain]
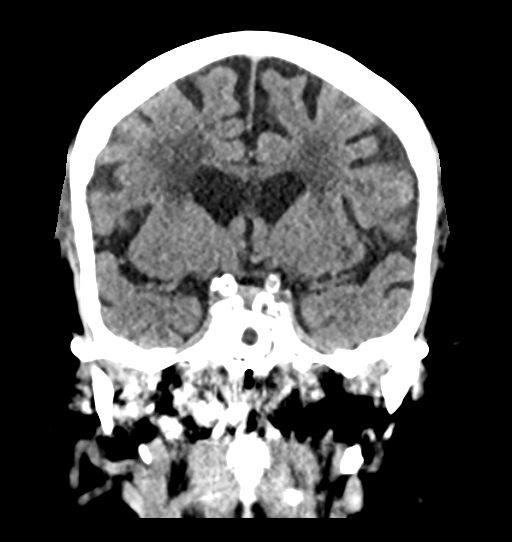

[Series 6: sag soft · sagittal · 0.35mm/px · 2 of 67 slices shown]
[im 23/67  brain]
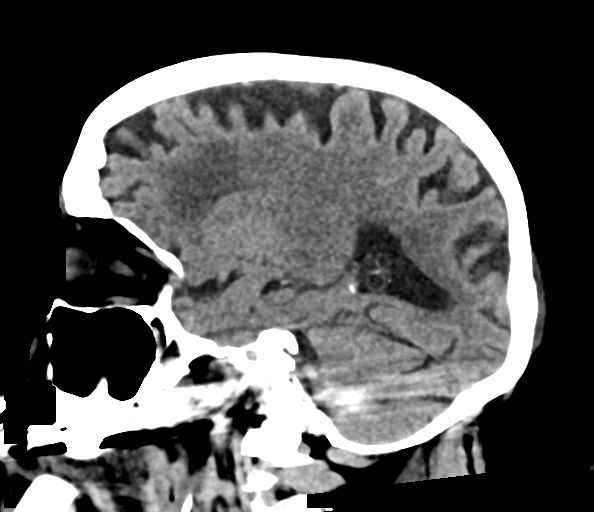
[im 45/67  brain]
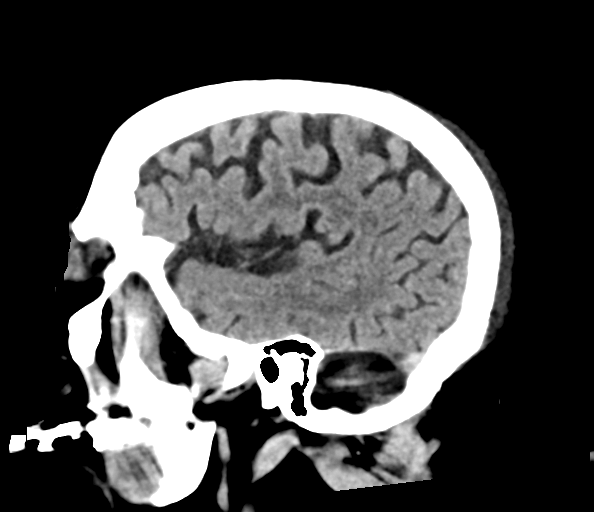

[Series 12: orthogonal axials · axial · 0.21mm/px · z∈[-272,-179]mm · 6 of 83 slices shown]
[im 12/83  brain]
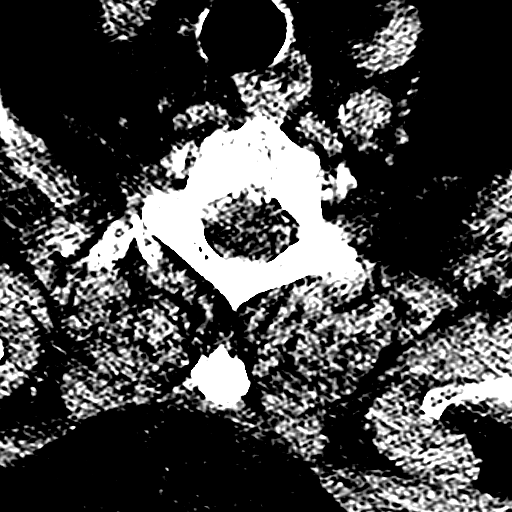
[im 24/83  brain]
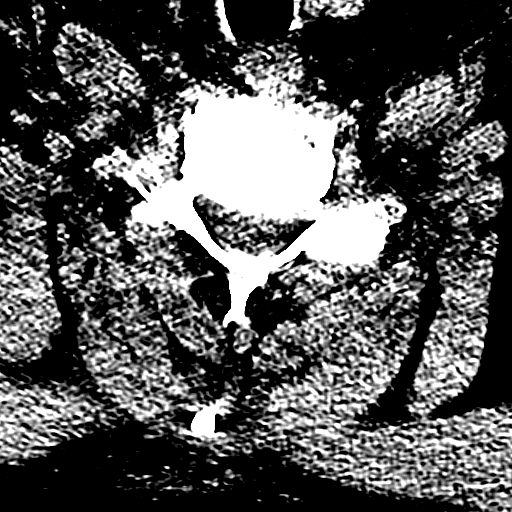
[im 36/83  brain]
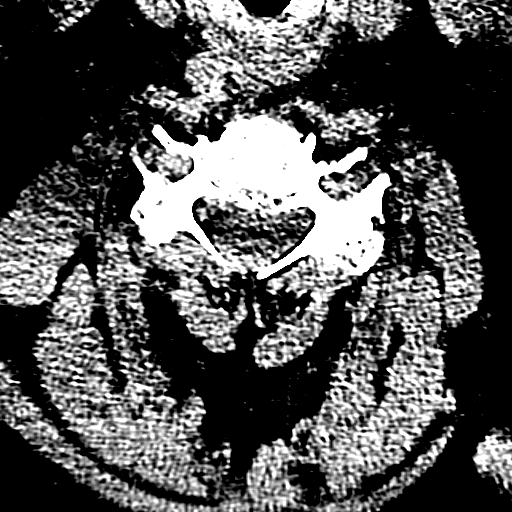
[im 47/83  brain]
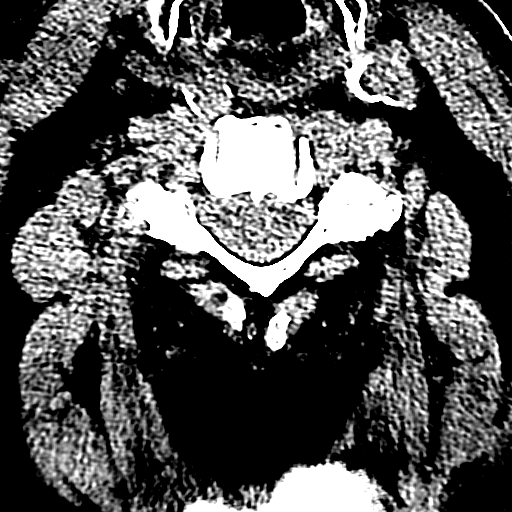
[im 59/83  brain]
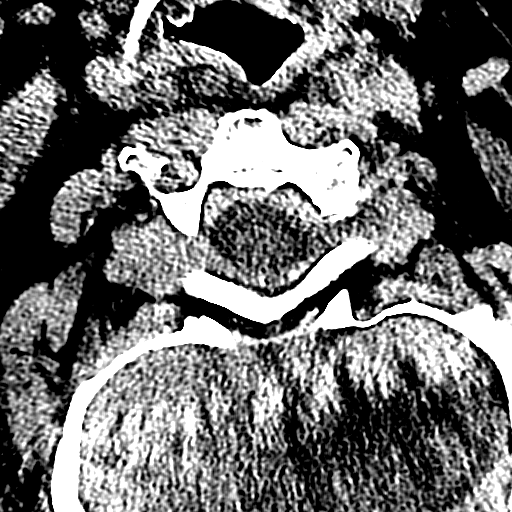
[im 71/83  brain]
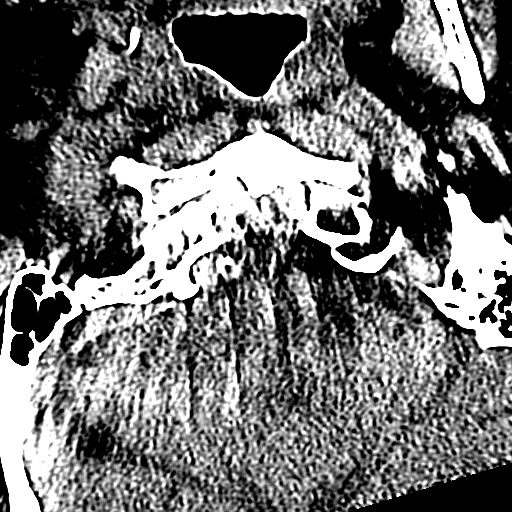

[17 of 47 positions shown; findings below may reference images not displayed]

FINDINGS: CT HEAD FINDINGS

Brain: No evidence of acute infarction, hemorrhage, hydrocephalus,
extra-axial collection or mass lesion/mass effect. Central, cerebral
and cerebellar cortical atrophy. Ex vacuo ventriculomegaly, stable.
Moderate periventricular white matter hypoattenuation consistent
with chronic microvascular ischemic white matter disease.

Vascular: Atherosclerotic calcifications throughout the bilateral
cavernous and supraclinoid carotid arteries.

Skull: Normal. Negative for fracture or focal lesion.

Sinuses/Orbits: Mild mucoperiosteal thickening throughout the
ethmoid air cells with moderate disease in the sphenoid air cell.

Other: None.

CT CERVICAL SPINE FINDINGS

Alignment: Normal.

Skull base and vertebrae: No acute fracture. No primary bone lesion
or focal pathologic process.

Soft tissues and spinal canal: No prevertebral fluid or swelling. No
visible canal hematoma.

Disc levels: Extensive multilevel degenerative disc disease
beginning at C4-C5 and extending into the upper thoracic spine. The
most severe degenerative changes are at C5-C6 and C6-C7. Multilevel
bilateral facet arthropathy is also present including ankylosis of
the posterior elements of C2 and C3 on the right and ankylosis of
the posterior elements on the left at C4-C5 and C5-C6.

Upper chest: Negative.

Other: None.
IMPRESSION: CT HEAD

1. No acute intracranial abnormality.
2. Atrophy and chronic microvascular ischemic white matter disease.
3. Mild inflammatory paranasal sinus disease.

CT CSPINE

1. No acute fracture or malalignment.
2. Advanced multilevel degenerative disc disease and facet
arthropathy.

## 2022-01-12 DEATH — deceased
